# Patient Record
Sex: Male | Born: 1945 | Race: White | Hispanic: No | Marital: Married | Smoking: Former smoker
Health system: Southern US, Community
[De-identification: ages and names within clinical notes are randomized; demographics above are authoritative.]

## PROBLEM LIST (undated history)

## (undated) DIAGNOSIS — K219 Gastro-esophageal reflux disease without esophagitis: Secondary | ICD-10-CM

## (undated) DIAGNOSIS — I10 Essential (primary) hypertension: Secondary | ICD-10-CM

## (undated) DIAGNOSIS — N411 Chronic prostatitis: Secondary | ICD-10-CM

## (undated) DIAGNOSIS — R338 Other retention of urine: Secondary | ICD-10-CM

## (undated) DIAGNOSIS — Z6841 Body Mass Index (BMI) 40.0 and over, adult: Secondary | ICD-10-CM

## (undated) DIAGNOSIS — I872 Venous insufficiency (chronic) (peripheral): Secondary | ICD-10-CM

## (undated) DIAGNOSIS — G4734 Idiopathic sleep related nonobstructive alveolar hypoventilation: Secondary | ICD-10-CM

## (undated) DIAGNOSIS — N401 Enlarged prostate with lower urinary tract symptoms: Secondary | ICD-10-CM

## (undated) DIAGNOSIS — G4733 Obstructive sleep apnea (adult) (pediatric): Secondary | ICD-10-CM

## (undated) DIAGNOSIS — Z8719 Personal history of other diseases of the digestive system: Secondary | ICD-10-CM

## (undated) DIAGNOSIS — L0390 Cellulitis, unspecified: Secondary | ICD-10-CM

## (undated) HISTORY — PX: GASTRIC BYPASS: SHX52

## (undated) HISTORY — DX: Gastro-esophageal reflux disease without esophagitis: K21.9

## (undated) HISTORY — PX: HIP SURGERY: SHX245

## (undated) HISTORY — PX: CHOLECYSTECTOMY: SHX55

## (undated) HISTORY — DX: Essential (primary) hypertension: I10

---

## 2003-01-28 HISTORY — PX: OTHER SURGICAL HISTORY: SHX169

## 2004-01-28 HISTORY — PX: COLONOSCOPY: SHX174

## 2012-06-14 DIAGNOSIS — G4730 Sleep apnea, unspecified: Secondary | ICD-10-CM | POA: Insufficient documentation

## 2012-11-01 ENCOUNTER — Encounter: Payer: Self-pay | Admitting: Surgery

## 2012-11-12 DIAGNOSIS — Z9889 Other specified postprocedural states: Secondary | ICD-10-CM | POA: Insufficient documentation

## 2012-11-12 DIAGNOSIS — E669 Obesity, unspecified: Secondary | ICD-10-CM | POA: Insufficient documentation

## 2012-11-27 ENCOUNTER — Encounter: Payer: Self-pay | Admitting: Surgery

## 2012-11-29 ENCOUNTER — Encounter: Payer: Self-pay | Admitting: Surgery

## 2012-12-14 ENCOUNTER — Ambulatory Visit: Payer: Self-pay | Admitting: Pediatrics

## 2013-01-05 DIAGNOSIS — K219 Gastro-esophageal reflux disease without esophagitis: Secondary | ICD-10-CM | POA: Insufficient documentation

## 2013-01-05 DIAGNOSIS — Z9049 Acquired absence of other specified parts of digestive tract: Secondary | ICD-10-CM | POA: Insufficient documentation

## 2013-01-05 HISTORY — DX: Gastro-esophageal reflux disease without esophagitis: K21.9

## 2013-01-27 HISTORY — PX: PELVIC EXAMINATION UNDER ANESTHESIA CYSTOURETHROSCOPY / SIGMOIDOSCOPY: SUR466

## 2013-02-18 ENCOUNTER — Ambulatory Visit: Payer: Self-pay

## 2013-05-04 ENCOUNTER — Ambulatory Visit: Payer: Self-pay | Admitting: Family Medicine

## 2013-07-24 ENCOUNTER — Ambulatory Visit: Payer: Self-pay

## 2013-07-24 LAB — URINALYSIS, COMPLETE
Bilirubin,UR: NEGATIVE
Glucose,UR: NEGATIVE mg/dL (ref 0–75)
Ketone: NEGATIVE
Nitrite: NEGATIVE
Ph: 6 (ref 4.5–8.0)
Protein: NEGATIVE
Specific Gravity: 1.02 (ref 1.003–1.030)
WBC UR: >30 /HPF (ref 0–5)

## 2013-07-26 LAB — URINE CULTURE

## 2013-09-14 DIAGNOSIS — N390 Urinary tract infection, site not specified: Secondary | ICD-10-CM | POA: Insufficient documentation

## 2013-10-11 DIAGNOSIS — Z6841 Body Mass Index (BMI) 40.0 and over, adult: Secondary | ICD-10-CM | POA: Insufficient documentation

## 2013-10-24 DIAGNOSIS — N138 Other obstructive and reflux uropathy: Secondary | ICD-10-CM | POA: Insufficient documentation

## 2013-10-24 DIAGNOSIS — N401 Enlarged prostate with lower urinary tract symptoms: Secondary | ICD-10-CM

## 2013-12-07 DIAGNOSIS — R339 Retention of urine, unspecified: Secondary | ICD-10-CM | POA: Insufficient documentation

## 2013-12-07 DIAGNOSIS — N4883 Acquired buried penis: Secondary | ICD-10-CM | POA: Insufficient documentation

## 2014-02-21 DIAGNOSIS — I10 Essential (primary) hypertension: Secondary | ICD-10-CM | POA: Insufficient documentation

## 2014-02-21 HISTORY — DX: Essential (primary) hypertension: I10

## 2014-06-30 ENCOUNTER — Encounter: Payer: Self-pay | Admitting: Gynecology

## 2014-06-30 ENCOUNTER — Ambulatory Visit
Admission: EM | Admit: 2014-06-30 | Discharge: 2014-06-30 | Disposition: A | Discharge status: Home / Self Care | Payer: Medicare Other | Attending: Registered Nurse | Admitting: Registered Nurse

## 2014-06-30 DIAGNOSIS — I8312 Varicose veins of left lower extremity with inflammation: Secondary | ICD-10-CM

## 2014-06-30 DIAGNOSIS — Z8719 Personal history of other diseases of the digestive system: Secondary | ICD-10-CM | POA: Insufficient documentation

## 2014-06-30 DIAGNOSIS — L03115 Cellulitis of right lower limb: Secondary | ICD-10-CM

## 2014-06-30 DIAGNOSIS — Z6841 Body Mass Index (BMI) 40.0 and over, adult: Secondary | ICD-10-CM | POA: Diagnosis not present

## 2014-06-30 DIAGNOSIS — N411 Chronic prostatitis: Secondary | ICD-10-CM | POA: Insufficient documentation

## 2014-06-30 DIAGNOSIS — I872 Venous insufficiency (chronic) (peripheral): Secondary | ICD-10-CM

## 2014-06-30 DIAGNOSIS — N401 Enlarged prostate with lower urinary tract symptoms: Secondary | ICD-10-CM | POA: Insufficient documentation

## 2014-06-30 DIAGNOSIS — I8311 Varicose veins of right lower extremity with inflammation: Secondary | ICD-10-CM

## 2014-06-30 DIAGNOSIS — R338 Other retention of urine: Secondary | ICD-10-CM

## 2014-06-30 HISTORY — DX: Benign prostatic hyperplasia with lower urinary tract symptoms: N40.1

## 2014-06-30 HISTORY — DX: Chronic prostatitis: N41.1

## 2014-06-30 HISTORY — DX: Venous insufficiency (chronic) (peripheral): I87.2

## 2014-06-30 HISTORY — DX: Cellulitis, unspecified: L03.90

## 2014-06-30 HISTORY — DX: Gastro-esophageal reflux disease without esophagitis: K21.9

## 2014-06-30 HISTORY — DX: Personal history of other diseases of the digestive system: Z87.19

## 2014-06-30 HISTORY — DX: Morbid (severe) obesity due to excess calories: E66.01

## 2014-06-30 HISTORY — DX: Body Mass Index (BMI) 40.0 and over, adult: Z684

## 2014-06-30 HISTORY — DX: Other retention of urine: R33.8

## 2014-06-30 HISTORY — DX: Essential (primary) hypertension: I10

## 2014-06-30 HISTORY — DX: Obstructive sleep apnea (adult) (pediatric): G47.33

## 2014-06-30 MED ORDER — SULFAMETHOXAZOLE-TRIMETHOPRIM 800-160 MG PO TABS: 1.0000 | ORAL_TABLET | Freq: Two times a day (BID) | ORAL | Status: DC

## 2014-06-30 MED ORDER — MUPIROCIN 2 % EX OINT: TOPICAL_OINTMENT | CUTANEOUS | Status: DC

## 2014-06-30 MED ORDER — LEVOFLOXACIN 500 MG PO TABS: 500.0000 mg | ORAL_TABLET | Freq: Every day | ORAL | Status: AC

## 2014-06-30 NOTE — ED Provider Notes (Signed)
CSN: [id number]     Arrival date & time 06/30/14  1130 History   None    Chief Complaint  Patient presents with  . Recurrent Skin Infections    Cellulitis   (Consider location/radiation/quality/duration/timing/severity/associated sxs/prior Treatment) HPI Comments: [name] with spouse here for evaluation of wound right lateral ankle that has been draining clear fluid and leg hot, painful, red compared to left.  Patient with history of cellulitis requiring wound care 2014 was prescribed bactrim DS 2 tabs po BID subsequently failed outpatient therapy and was given IV antibiotics and [facility] Wound care/hyperbarics referral.  Patient reported he took left over antibiotics from 2014 2 tabs prior to coming to clinic today.  The history is provided by the patient and the spouse.    Past Medical History  Diagnosis Date  . Cellulitis     foot  . GERD (gastroesophageal reflux disease)   . Hypertension   . Obstructive sleep apnea   . BPH (benign prostatic hypertrophy) with urinary retention   . Chronic bacterial prostatitis   . Morbid obesity with BMI of 50.0-59.9, adult   . Stasis dermatitis of both legs   . H/O: upper GI bleed    Past Surgical History  Procedure Laterality Date  . Hip surgery      right  . Gastric bypass    . Cholecystectomy    . Colonoscopy  2006  . Pelvic examination under anesthesia cystourethroscopy / sigmoidoscopy  2015  . Hip replacement Right 2005   Family History  Problem Relation Age of Onset  . Diabetes Mother    History  Substance Use Topics  . Smoking status: Never Smoker   . Smokeless tobacco: Not on file  . Alcohol Use: No    Review of Systems  Constitutional: Negative for fever, chills, diaphoresis, activity change, appetite change and fatigue.  HENT: Negative for facial swelling and nosebleeds.   Eyes: Negative for pain, discharge and itching.  Respiratory: Negative for cough, choking, shortness of breath, wheezing and stridor.     Cardiovascular: Positive for leg swelling. Negative for chest pain and palpitations.  Gastrointestinal: Negative for nausea, vomiting, diarrhea and blood in stool.  Endocrine: Negative for cold intolerance and heat intolerance.  Genitourinary: Negative for difficulty urinating.  Musculoskeletal: Positive for myalgias. Negative for back pain, joint swelling, arthralgias, gait problem, neck pain and neck stiffness.  Skin: Positive for color change, rash and wound. Negative for pallor.  Allergic/Immunologic: Negative for environmental allergies and food allergies.  Neurological: Negative for dizziness, tremors, syncope, facial asymmetry, weakness and headaches.  Hematological: Negative for adenopathy. Does not bruise/bleed easily.  Psychiatric/Behavioral: Negative for behavioral problems, confusion, sleep disturbance and agitation.    Allergies  Aspirin  Home Medications   Prior to Admission medications   Medication Sig Start Date End Date Taking? Authorizing Provider  omeprazole (PRILOSEC) 20 MG capsule Take 20 mg by mouth daily.   Yes Historical Provider, MD  tamsulosin (FLOMAX) 0.4 MG CAPS capsule Take 0.4 mg by mouth.   Yes Historical Provider, MD  triamterene-hydrochlorothiazide (DYAZIDE) 37.5-25 MG per capsule Take 1 capsule by mouth daily.   Yes Historical Provider, MD  levofloxacin (LEVAQUIN) 500 MG tablet Take 1 tablet (500 mg total) by mouth daily. 06/30/14 07/10/14  Barbaraann Barthelina A Gari Trovato, NP  mupirocin ointment (BACTROBAN) 2 % Apply to wound twice a day until healed 06/30/14   Barbaraann Barthelina A Delano Frate, NP  sulfamethoxazole-trimethoprim (BACTRIM DS,SEPTRA DS) 800-160 MG per tablet Take 1 tablet by mouth 2 (two) times  daily. 06/30/14   Barbaraann Barthel, NP   BP 129/54 mmHg  Pulse 60  Temp(Src) 98.2 F (36.8 C) (Oral)  Ht  (1.727 m)  Wt 385 lb (174.635 kg)  BMI 58.55 kg/m2  SpO2 99% Physical Exam  Constitutional: He is oriented to person, place, and time. Vital signs are normal. He  appears well-developed and well-nourished.  HENT:  Head: Normocephalic and atraumatic.  Right Ear: External ear normal.  Left Ear: External ear normal.  Nose: Nose normal.  Mouth/Throat: Oropharynx is clear and moist.  Eyes: Conjunctivae, EOM and lids are normal. Pupils are equal, round, and reactive to light. Right eye exhibits no discharge. Left eye exhibits no discharge. No scleral icterus.  Neck: Trachea normal and normal range of motion. Neck supple. No tracheal deviation present.  Cardiovascular: Normal rate, regular rhythm, normal heart sounds and intact distal pulses.   Pulmonary/Chest: Effort normal and breath sounds normal. No respiratory distress. He has no wheezes. He has no rales. He exhibits no tenderness.  Abdominal: Soft. There is no tenderness.  Musculoskeletal: Normal range of motion. He exhibits edema and tenderness.       Right lower leg: He exhibits tenderness, swelling and laceration. He exhibits no bony tenderness, no edema and no deformity.  Lymphadenopathy:    He has no cervical adenopathy.  Neurological: He is alert and oriented to person, place, and time.  Skin: Skin is warm and dry. Laceration and rash noted. No abrasion, no bruising, no burn, no ecchymosis, no petechiae and no purpura noted. Rash is macular. Rash is not papular, not maculopapular, not nodular, not pustular, not vesicular and not urticarial. There is erythema. No pallor.     Psychiatric: He has a normal mood and affect. His speech is normal and behavior is normal. Judgment and thought content normal. Cognition and memory are normal.  Nursing note and vitals reviewed.   ED Course  Procedures (including critical care time) Labs Review Labs Reviewed - No data to display  Imaging Review No results found.   MDM   1. Cellulitis of right lower extremity   2. Stasis dermatitis of both legs   3. Morbid obesity with BMI of 50.0-59.9, adult    Elevate bilateral lower extremities, contact wound  clinic [facility] Monday to schedule appt as history cellulitis that required wound care referra/hyperbarics/IV antibiotics 2014.  Bilateral stasis dermatitis.  Will cover with levofloxacin  po daily, bactrim DS po BID and bactroban 2% topical BID.  Follow up this weekend for wound re-evaluation if worsening outside lines drawn today.  Exitcare handout on skin infection given to patient.  Go to ER if worsening erythema, pain, purulent discharge, fever as will require IV antibiotics.  Patient and spouse verbalized understanding, agreed with plan of care and had no further questions at this time.      Barbaraann Barthel, NP 06/30/14 1437

## 2014-06-30 NOTE — ED Notes (Signed)
Patient c/o x 4 days ago cut on right lower leg with a weed trimmer at home. Pt. Pt. Recurrent cellulitis the patient. Stated unable to see his primary care doctor and his prior wound center at [facility] need referral before he can be seen.

## 2014-06-30 NOTE — Discharge Instructions (Signed)
Obesity Obesity is defined as having too much total body fat and a body mass index (BMI) of 30 or more. BMI is an estimate of body fat and is calculated from your height and weight. Obesity happens when you consume more calories than you can burn by exercising or performing daily physical tasks. Prolonged obesity can cause major illnesses or emergencies, such as:   Stroke.  Heart disease.  Diabetes.  Cancer.  Arthritis.  High blood pressure (hypertension).  High cholesterol.  Sleep apnea.  Erectile dysfunction.  Infertility problems. CAUSES   Regularly eating unhealthy foods.  Physical inactivity.  Certain disorders, such as an underactive thyroid (hypothyroidism), Cushing's syndrome, and polycystic ovarian syndrome.  Certain medicines, such as steroids, some depression medicines, and antipsychotics.  Genetics.  Lack of sleep. DIAGNOSIS  A health care provider can diagnose obesity after calculating your BMI. Obesity will be diagnosed if your BMI is 30 or higher.  There are other methods of measuring obesity levels. Some other methods include measuring your skinfold thickness, your waist circumference, and comparing your hip circumference to your waist circumference. TREATMENT  A healthy treatment program includes some or all of the following:  Long-term dietary changes.  Exercise and physical activity.  Behavioral and lifestyle changes.  Medicine only under the supervision of your health care provider. Medicines may help, but only if they are used with diet and exercise programs. An unhealthy treatment program includes:  Fasting.  Fad diets.  Supplements and drugs. These choices do not succeed in long-term weight control.  HOME CARE INSTRUCTIONS   Exercise and perform physical activity as directed by your health care provider. To increase physical activity, try the following:  Use stairs instead of elevators.  Park farther away from store  entrances.  Garden, bike, or walk instead of watching television or using the computer.  Eat healthy, low-calorie foods and drinks on a regular basis. Eat more fruits and vegetables. Use low-calorie cookbooks or take healthy cooking classes.  Limit fast food, sweets, and processed snack foods.  Eat smaller portions.  Keep a daily journal of everything you eat. There are many free websites to help you with this. It may be helpful to measure your foods so you can determine if you are eating the correct portion sizes.  Avoid drinking alcohol. Drink more water and drinks without calories.  Take vitamins and supplements only as recommended by your health care provider.  Weight-loss support groups, Government social research officer, counselors, and stress reduction education can also be very helpful. SEEK IMMEDIATE MEDICAL CARE IF:  You have chest pain or tightness.  You have trouble breathing or feel short of breath.  You have weakness or leg numbness.  You feel confused or have trouble talking.  You have sudden changes in your vision. MAKE SURE YOU:  Understand these instructions.  Will watch your condition.  Will get help right away if you are not doing well or get worse. Document Released: 02/21/2004 Document Revised: 05/30/2013 Document Reviewed: 02/19/2011 [facility] Patient Information 2015 [place], [place]. This information is not intended to replace advice given to you by your health care provider. Make sure you discuss any questions you have with your health care provider. Stasis Dermatitis Stasis dermatitis occurs when veins lose the ability to pump blood back to the heart (poor venous circulation). It causes a reddish-purple to brownish scaly, itchy rash on the legs. The rash comes from pooling of blood (stasis). CAUSES  This occurs because the veins do not work very well anymore  or because pressure may be increased in the veins due to other conditions. With blood pooling, the  increased pressure in the tiny blood vessels (capillaries) causes fluid to leak out of the capillaries into the tissue. The extra fluid makes it harder for the blood to feed the cells and get rid of waste products. SYMPTOMS  Stasis dermatitis appears as red, scaly, itchy patches on the legs. A yellowish or light brown discoloration is also present. Due to scratching or other injury, these patches can become an ulcer. This ulcer may remain for long periods of time. The ulcer can also become infected. Swelling of the legs is often present with stasis dermatitis. If the leg is swollen, this increases the risk of infection and further damage to the skin. Sometimes, intense itching, tingling, and burning occurs before signs of stasis dermatitis appear. You may find yourself scratching the insides of your ankles or rubbing your ankles together before the rash appears. After healing, there are often brown spots on the affected skin. DIAGNOSIS  Your caregiver makes this diagnosis based on an exam. Other tests may be done to better understand the cause. TREATMENT  If underlying conditions are present, they must be treated. Some of these conditions are heart failure, thyroid problems, poor nutrition, and varicose veins.  Cortisone creams and ointments applied to the skin (topically) may be needed, as well as medicine to reduce swelling in the legs (diuretics).  Compression stockings or an elastic wrap may also be needed to reduce swelling.  If there is an infection, antibiotic medicines may also be used. HOME CARE INSTRUCTIONS   Try to rest and raise (elevate) the affected leg above the level of the heart, if possible.  Burow's solution wet packs applied for 30 minutes, 3 times daily, will help the weepy rash. Stop using the packs before your skin gets too dry. You can also use a mixture of 3 parts white vinegar to 1 quart water.  Grease your legs daily with ointments, such as petroleum jelly, to fight  dryness.  Avoid scratching or injuring the area. SEEK IMMEDIATE MEDICAL CARE IF:   Your rash gets worse.  An ulcer forms.  You have an oral temperature above 102 F (38.9 C), not controlled by medicine.  You have any other severe symptoms. Document Released: 04/24/2005 Document Revised: 04/07/2011 Document Reviewed: 06/04/2009 [facility] Patient Information 2015 [place]. This information is not intended to replace advice given to you by your health care provider. Make sure you discuss any questions you have with your health care provider. Cellulitis Cellulitis is an infection of the skin and the tissue beneath it. The infected area is usually red and tender. Cellulitis occurs most often in the arms and lower legs.  CAUSES  Cellulitis is caused by bacteria that enter the skin through cracks or cuts in the skin. The most common types of bacteria that cause cellulitis are staphylococci and streptococci. SIGNS AND SYMPTOMS   Redness and warmth.  Swelling.  Tenderness or pain.  Fever. DIAGNOSIS  Your health care provider can usually determine what is wrong based on a physical exam. Blood tests may also be done. TREATMENT  Treatment usually involves taking an antibiotic medicine. HOME CARE INSTRUCTIONS   Take your antibiotic medicine as directed by your health care provider. Finish the antibiotic even if you start to feel better.  Keep the infected arm or leg elevated to reduce swelling.  Apply a warm cloth to the affected area up to 4 times per  day to relieve pain.  Take medicines only as directed by your health care provider.  Keep all follow-up visits as directed by your health care provider. SEEK MEDICAL CARE IF:   You notice red streaks coming from the infected area.  Your red area gets larger or turns dark in color.  Your bone or joint underneath the infected area becomes painful after the skin has healed.  Your infection returns in the same area or another  area.  You notice a swollen bump in the infected area.  You develop new symptoms.  You have a fever. SEEK IMMEDIATE MEDICAL CARE IF:   You feel very sleepy.  You develop vomiting or diarrhea.  You have a general ill feeling (malaise) with muscle aches and pains. MAKE SURE YOU:   Understand these instructions.  Will watch your condition.  Will get help right away if you are not doing well or get worse. Document Released: 10/23/2004 Document Revised: 05/30/2013 Document Reviewed: 03/31/2011 [facility] Patient Information 2015 [place], [place]. This information is not intended to replace advice given to you by your health care provider. Make sure you discuss any questions you have with your health care provider.

## 2014-07-04 ENCOUNTER — Encounter: Payer: Medicare Other | Attending: Surgery | Admitting: Surgery

## 2014-07-04 DIAGNOSIS — I10 Essential (primary) hypertension: Secondary | ICD-10-CM | POA: Insufficient documentation

## 2014-07-04 DIAGNOSIS — R600 Localized edema: Secondary | ICD-10-CM | POA: Diagnosis not present

## 2014-07-04 DIAGNOSIS — Z87891 Personal history of nicotine dependence: Secondary | ICD-10-CM | POA: Insufficient documentation

## 2014-07-04 DIAGNOSIS — X58XXXA Exposure to other specified factors, initial encounter: Secondary | ICD-10-CM | POA: Diagnosis not present

## 2014-07-04 DIAGNOSIS — S81811A Laceration without foreign body, right lower leg, initial encounter: Secondary | ICD-10-CM | POA: Insufficient documentation

## 2014-07-04 DIAGNOSIS — G4733 Obstructive sleep apnea (adult) (pediatric): Secondary | ICD-10-CM | POA: Diagnosis not present

## 2014-07-04 DIAGNOSIS — I87333 Chronic venous hypertension (idiopathic) with ulcer and inflammation of bilateral lower extremity: Secondary | ICD-10-CM | POA: Insufficient documentation

## 2014-07-04 DIAGNOSIS — L97312 Non-pressure chronic ulcer of right ankle with fat layer exposed: Secondary | ICD-10-CM | POA: Insufficient documentation

## 2014-07-04 NOTE — Progress Notes (Addendum)
INDIO, SANTILLI ([id number]) Visit Report for 07/04/2014 Chief Complaint Document Details Patient Name: [name]. Date of Service: 07/04/2014 11:15 AM Medical Record Number: [id number] Patient Account Number: [id number] Date of Birth/Sex: 1945/04/21 (69 y.o. Male) Treating RN: Primary Care Physician: HEARDChrissie Noa Other Clinician: Referring Physician: HEARD, Chrissie Noa Treating Physician/Extender: Rudene Re in Treatment: 0 Information Obtained from: Patient Chief Complaint Patient presents to the wound care center for a consult due non healing wound. He recently had a injury to his right lower extremity on the lateral part while working in the yard about 2 weeks ago. This progressed to an ulceration and he went to the ED about 4 days ago. Electronic Signature(s) Signed: 07/04/2014 12:36:02 PM By: Evlyn Kanner MD, FACS Entered By: Evlyn Kanner on 07/04/2014 12:26:34 [name] ([id number]) -------------------------------------------------------------------------------- Debridement Details Patient Name: [name]. Date of Service: 07/04/2014 11:15 AM Medical Record Number: [id number] Patient Account Number: [id number] Date of Birth/Sex: 02/25/1945 (69 y.o. Male) Treating RN: Primary Care Physician: HEARDChrissie Noa Other Clinician: Referring Physician: HEARD, Chrissie Noa Treating Physician/Extender: Rudene Re in Treatment: 0 Debridement Performed for Wound #4 Right,Lateral Malleolus Assessment: Performed By: Physician Tristan Schroeder., MD Debridement: Debridement Pre-procedure Yes Verification/Time Out Taken: Start Time: 09:15 Pain Control: Lidocaine 4% Topical Solution Level: Skin/Subcutaneous Tissue Total Area Debrided (L x 5.1 (cm) x 3.2 (cm) = 16.32 (cm) W): Tissue and other Viable, Non-Viable, Eschar, Exudate, Fibrin/Slough, Skin, Subcutaneous material debrided: Instrument: Curette Bleeding: Minimum Hemostasis Achieved: Pressure End Time:  09:18 Procedural Pain: 2 Post Procedural Pain: 0 Response to Treatment: Procedure was tolerated well Post Debridement Measurements of Total Wound Length: (cm) 5.1 Width: (cm) 3.2 Depth: (cm) 0.2 Volume: (cm) 2.564 Electronic Signature(s) Signed: 07/04/2014 12:36:02 PM By: Evlyn Kanner MD, FACS Entered By: Evlyn Kanner on 07/04/2014 12:25:42 [name] ([id number]) -------------------------------------------------------------------------------- HPI Details Patient Name: [name]. Date of Service: 07/04/2014 11:15 AM Medical Record Number: [id number] Patient Account Number: [id number] Date of Birth/Sex: Apr 14, 1945 (69 y.o. Male) Treating RN: Primary Care Physician: Tawni Levy Other Clinician: Referring Physician: HEARD, Chrissie Noa Treating Physician/Extender: Rudene Re in Treatment: 0 History of Present Illness Location: right lower extremity Quality: Patient reports experiencing a dull pain to affected area(s). Severity: Patient states wound are getting worse. Duration: Patient has had the wound for < 2 weeks prior to presenting for treatment Timing: injury occurred while he was working in the yard where he had a laceration on the right lateral aspect of his ankle. Context: The wound occurred when the patient was using a weed Johnell Comings. Modifying Factors: Consults to this date include:going to the ED where he was put on Levaquin and Bactrim and Bactroban. Associated Signs and Symptoms: Patient reports having difficulty standing for long periods. HPI Description: As noted above the patient had a injury due to working in the yard and this rapidly progressed to a ulcerated area with an infection. In October 2014 he did have some vascular studies done but never saw a Careers adviser and never had any surgery for varicose veins. His past medical history is significant for hypertension, obstructive sleep apnea, morbid obesity, stasis dermatitis of both lower extremities and  his past surgical history is suggestive of hip surgery on the right, gastric bypass surgery, cholecystectomy, hip replacement. He has recently been put on Levaquin 500 milligrams daily, Bactrim DS 1 twice a day and Bactroban 2% topical ointment locally. No recent labs or vascular or imaging studies done. Electronic Signature(s) Signed: 07/04/2014 12:36:02 PM By: Evlyn Kanner  MD, FACS Entered By: Evlyn Kanner on 07/04/2014 12:28:20 [name] ([id number]) -------------------------------------------------------------------------------- Physical Exam Details Patient Name: [name], [name] Date of Service: 07/04/2014 11:15 AM Medical Record Number: [id number] Patient Account Number: [id number] Date of Birth/Sex: 07-13-1945 (69 y.o. Male) Treating RN: Primary Care Physician: Tawni Levy Other Clinician: Referring Physician: HEARD, Chrissie Noa Treating Physician/Extender: Rudene Re in Treatment: 0 Constitutional . Pulse regular. Respirations normal and unlabored. Afebrile. . Eyes Nonicteric. Reactive to light. Ears, Nose, Mouth, and Throat Lips, teeth, and gums WNL.Marland [name] Moist mucosa without lesions . Neck supple and nontender. No palpable supraclavicular or cervical adenopathy. Normal sized without goiter. Respiratory WNL. No retractions.. Cardiovascular Pedal Pulses WNL. ABI on the right is 1.27 and on the left is 1.21.. he has typical stasis dermatitis both lower extremities.. Gastrointestinal (GI) large obese abdomen. liver and spleen not palpable. Musculoskeletal Adexa without tenderness or enlargement.. Digits and nails w/o clubbing, cyanosis, infection, petechiae, ischemia, or inflammatory conditions.. Integumentary (Hair, Skin) he has a lacerated wound on the right lower extremity on the lateral aspect just above his lateral malleolus and this is rather diffuse and covered with slough.. No crepitus or fluctuance. No peri-wound warmth or erythema. No  masses.Marland [name] Psychiatric Judgement and insight Intact.. No evidence of depression, anxiety, or agitation.. Electronic Signature(s) Signed: 07/04/2014 12:36:02 PM By: Evlyn Kanner MD, FACS Entered By: Evlyn Kanner on 07/04/2014 12:29:46 [name] ([id number]) -------------------------------------------------------------------------------- Physician Orders Details Patient Name: [name]. Date of Service: 07/04/2014 11:15 AM Medical Record Number: [id number] Patient Account Number: [id number] Date of Birth/Sex: Jul 04, 1945 (69 y.o. Male) Treating RN: Renee Harder Primary Care Physician: HEARD, Chrissie Noa Other Clinician: Referring Physician: HEARD, Chrissie Noa Treating Physician/Extender: Rudene Re in Treatment: 0 Verbal / Phone Orders: Yes Clinician: Renee Harder Read Back and Verified: Yes Diagnosis Coding Wound Cleansing Wound #4 Right,Lateral Malleolus o Clean wound with Normal Saline. o May Shower, gently pat wound dry prior to applying new dressing. Anesthetic Wound #4 Right,Lateral Malleolus o Topical Lidocaine 4% cream applied to wound bed prior to debridement Primary Wound Dressing Wound #4 Right,Lateral Malleolus o Aquacel Ag Secondary Dressing Wound #4 Right,Lateral Malleolus o Boardered Foam Dressing Dressing Change Frequency Wound #4 Right,Lateral Malleolus o Change dressing every other day. Follow-up Appointments Wound #4 Right,Lateral Malleolus o Return Appointment in 1 week. Edema Control Wound #4 Right,Lateral Malleolus o Patient to wear own compression stockings o Elevate legs to the level of the heart and pump ankles as often as possible Medications-please add to medication list. Wound #4 Right,Lateral Malleolus o P.O. Antibiotics - Continue PO antibiotics prescribed by [facility] ED Malina, [name] ([id number]) Services and Therapies o Venous Studies -Bilateral - AVandV oooo Electronic Signature(s) Signed: 07/04/2014  12:36:02 PM By: Evlyn Kanner MD, FACS Signed: 07/04/2014 4:43:53 PM By: Renee Harder RN Entered By: Renee Harder on 07/04/2014 12:22:20 Markuson, [name] ([id number]) -------------------------------------------------------------------------------- Problem List Details Patient Name: ZAKAI, GONYEA R. Date of Service: 07/04/2014 11:15 AM Medical Record Number: [id number] Patient Account Number: [id number] Date of Birth/Sex: 07/29/1945 (69 y.o. 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 Date of Service: 07/04/2014 11:15 AM Medical Record Number: [id number] Patient Account Number: [id number] Date of Birth/Sex: 08-Sep-1945 (69 y.o. Male) Treating RN: Primary Care Physician: HEARDChrissie Noa Other Clinician: Referring Physician: HEARD, Chrissie Noa Treating Physician/Extender: Rudene Re in Treatment: 0 Subjective Chief Complaint Information obtained from Patient Patient presents to the wound care center for a consult due non healing wound. He recently had a injury to his right lower extremity on the  lateral part while working in the yard about 2 weeks ago. This progressed to an ulceration and he went to the ED about 4 days ago. History of Present Illness (HPI) The following HPI elements were documented for the patient's wound: Location: right lower extremity Quality: Patient reports experiencing a dull pain to affected area(s). Severity: Patient states wound are getting worse. Duration: Patient has had the wound for < 2 weeks prior to presenting for treatment Timing: injury occurred while he was working in the yard where he had a laceration on the right lateral aspect of his ankle. Context: The wound occurred when the patient was using a weed Johnell Comings. Modifying Factors: Consults to this date include:going to the ED where he was put on Levaquin and Bactrim and Bactroban. Associated Signs and Symptoms: Patient reports having difficulty standing for long periods. As noted above the patient had a injury due to working in the yard and this rapidly progressed to a ulcerated area with an infection. In October 2014 he did have some vascular studies done but never saw a Careers adviser and never had any surgery for varicose veins. His past medical history is significant for hypertension, obstructive sleep apnea, morbid obesity, stasis dermatitis of both lower extremities and his past surgical history is suggestive of hip surgery on the right, gastric bypass surgery, cholecystectomy, hip replacement. He has recently been put on Levaquin 500 milligrams daily, Bactrim DS 1 twice a day and Bactroban 2% topical ointment locally. No recent labs or vascular or imaging studies done. Wound History Patient presents with 1 open wound that has been present for approximately 10 days. Patient has been treating wound in the following manner: bactroban. Laboratory tests have not been performed in the last month. Patient reportedly has not tested positive for an antibiotic resistant organism. Patient reportedly has not  tested positive for osteomyelitis. Patient reportedly has had testing performed to evaluate circulation in the legs. Patient experiences the following problems associated with their wounds: infection. IKER, NUTTALL ([id number]) Patient History Information obtained from Patient. Allergies aspirin Family History Cancer - Mother, Diabetes - Mother, Heart Disease - Maternal Grandparents, No family history of Hereditary Spherocytosis, Hypertension, Kidney Disease, Lung Disease, Seizures, Stroke, Thyroid Problems, Tuberculosis. Social History Former smoker, Marital Status - Married, Alcohol Use - Rarely, Drug Use - No History, Caffeine Use - Moderate. Medical History Hospitalization/Surgery History - 06/28/2003, [place], cellulitis. - 07/27/2005, [facility] [place], cellulitis. - 07/28/2003, [facility], hip replacement. - 06/30/2014, [facility] ED, cellulitis. Review of Systems (ROS) Eyes The patient has no complaints or symptoms. Ear/Nose/Mouth/Throat The patient has no complaints or symptoms. Hematologic/Lymphatic The patient has no complaints or symptoms. Respiratory The patient has no complaints or symptoms. Cardiovascular The patient has no complaints or symptoms. Gastrointestinal The patient has no complaints or symptoms, GERD Endocrine The patient has no complaints or symptoms. Genitourinary The patient  has no complaints or symptoms, BPH with urinary retention Immunological The patient has no complaints or symptoms. Integumentary (Skin) Complains or has symptoms of Wounds, Swelling. Denies complaints or symptoms of Bleeding or bruising tendency, Breakdown, stasis dermatitis Musculoskeletal The patient has no complaints or symptoms. Neurologic The patient has no complaints or symptoms. Oncologic The patient has no complaints or symptoms. Psychiatric The patient has no complaints or symptoms. [name] AlmaSHAFAR, [name] ([id number]) Medications: I have reviewed a list of his medications which include  Prilosec, Flomax, Dyazide, Levaquin, Bactroban ointment, Bactrim DS Objective Constitutional Pulse regular. Respirations normal and unlabored. Afebrile. Vitals Time Taken: 11:22 AM, Height: 68 in, Source: Stated, Weight: 385 lbs, Source: Stated, BMI: 58.5, Temperature: 98.1 F, Pulse: 63 bpm, Respiratory Rate: 20 breaths/min, Blood Pressure: 130/44 mmHg. Eyes Nonicteric. Reactive to light. Ears, Nose, Mouth, and Throat Lips, teeth, and gums WNL.Marland [name]. Moist mucosa without lesions . Neck supple and nontender. No palpable supraclavicular or cervical adenopathy. Normal sized without goiter. Respiratory WNL. No retractions.. Cardiovascular Pedal Pulses WNL. ABI on the right is 1.27 and on the left is 1.21.. he has typical stasis dermatitis both lower extremities.. Gastrointestinal (GI) large obese abdomen. liver and spleen not palpable. Musculoskeletal Adexa without tenderness or enlargement.. Digits and nails w/o clubbing, cyanosis, infection, petechiae, ischemia, or inflammatory conditions.Marland [name]. Psychiatric Judgement and insight Intact.. No evidence of depression, anxiety, or agitation.[name], [name] ([id number]) Integumentary (Hair, Skin) he has a lacerated wound on the right lower extremity on the lateral aspect just above his lateral malleolus and this is rather diffuse and covered with slough.. No crepitus or fluctuance. No peri-wound warmth or erythema. No masses.. Wound #4 status is Open. Original cause of wound was Trauma. The wound is located on the Right,Lateral Malleolus. The wound measures 5.1cm length x 3.2cm width x 0.2cm depth; 12.818cm^2 area and 2.564cm^3 volume. The wound is limited to skin breakdown. There is no tunneling or undermining noted. There is a medium amount of serous drainage noted. The wound margin is flat and intact. There is small (1-33%) pink granulation within the wound bed. There is a large (67-100%) amount of necrotic tissue within the wound bed  including Adherent Slough. The periwound skin appearance did not exhibit: Callus, Crepitus, Excoriation, Fluctuance, Friable, Induration, Localized Edema, Rash, Scarring, Dry/Scaly, Maceration, Moist, Atrophie Blanche, Cyanosis, Ecchymosis, Hemosiderin Staining, Mottled, Pallor, Rubor, Erythema. Periwound temperature was noted as No Abnormality. 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 What was possibly a minor laceration has now grown up into a full fledged ulceration on the lateral aspect of his right lower extremity and ankle and this is possibly due to his venous hypertension. Will begin by application of silver alginate to this on alternate days and to start using his compression stockings as before. We will also get him to get a venous ultrasound of his lower extremities and vascular opinion from the [facility] at [facility]. ([id number]) he  understands the need for elevation of his limbs whenever possible and sleeping with his legs on pillows to elevate them above the level of his heart. He will also need compression stockings on a regular basis and he understands the importance of this. Complete his course of antibiotics and see me next week. Procedures Wound #4 Wound #4 is a Trauma, Other located on the Right,Lateral  Malleolus . There was a Skin/Subcutaneous Tissue Debridement ([id number]) debridement with total area of 16.32 sq cm performed by Tristan Schroeder., MD. with the following instrument(s): Curette to remove Viable and Non-Viable tissue/material including Exudate, Fibrin/Slough, Eschar, Skin, and Subcutaneous after achieving pain control using Lidocaine 4% Topical Solution. A time out was conducted prior to the start of the procedure. A Minimum amount of bleeding was controlled with Pressure. The procedure was tolerated well with a pain level of 2 throughout and a pain level of 0 following the procedure. Post Debridement Measurements: 5.1cm length x 3.2cm width x 0.2cm depth; 2.564cm^3 volume. Plan Wound Cleansing: Wound #4 Right,Lateral Malleolus: Clean wound with Normal Saline. May Shower, gently pat wound dry prior to applying new dressing. Anesthetic: Wound #4 Right,Lateral Malleolus: Topical Lidocaine 4% cream applied to wound bed prior to debridement Primary Wound Dressing: Wound #4 Right,Lateral Malleolus: Aquacel Ag Secondary Dressing: Wound #4 Right,Lateral Malleolus: Boardered Foam Dressing Dressing Change Frequency: Wound #4 Right,Lateral Malleolus: Change dressing every other day. Follow-up Appointments: Wound #4 Right,Lateral Malleolus: Return Appointment in 1 week. Edema Control: Wound #4 Right,Lateral Malleolus: Patient to wear own compression stockings Elevate legs to the level of the heart and pump ankles as often as possible Medications-please add to medication list.: Wound #4 Right,Lateral Malleolus: P.O. Antibiotics - Continue PO antibiotics prescribed by [facility] ED Services and Therapies ordered were: [name], [name] ([id number]) Venous Studies -Bilateral - AVandV This pleasant 69 year old gentleman who has morbid obesity and possibly chronic venous hypertension for several years has not been using any elastic compression stockings for a long while. What was  possibly a minor laceration has now grown up into a full fledged ulceration on the lateral aspect of his right lower extremity and ankle and this is possibly due to his venous hypertension. Will begin by application of silver alginate to this on alternate days and to start using his compression stockings as before. We will also get him to get a venous ultrasound of his lower extremities and vascular opinion from the surgeons at [facility]. he understands the need for elevation of his limbs whenever possible and sleeping with his legs on pillows to elevate them above the level of his heart. He will also need compression stockings on a regular basis and he understands the importance of this. Complete his course of antibiotics and see me next week. Electronic Signature(s) Signed: 07/11/2014 11:20:23 AM By: Renee Harder RN Signed: 07/12/2014 4:45:54 PM By: Evlyn Kanner MD, FACS Previous Signature: 07/04/2014 3:34:49 PM Version By: Evlyn Kanner MD, FACS Previous Signature: 07/04/2014 12:36:02 PM Version By: Evlyn Kanner MD, FACS Entered By: Renee Harder on 07/11/2014 11:20:23 Brix, [name] ([id number]) -------------------------------------------------------------------------------- ROS/PFSH Details Patient Name: RAMZI, BRATHWAITE R. Date of Service: 07/04/2014 11:15 AM Medical Record Number: [id number] Patient Account Number: [id number] Date of Birth/Sex: 23-Jun-1945 (69 y.o. Male) Treating RN: Curtis Sites Primary Care Physician: HEARD, Chrissie Noa Other Clinician: Referring Physician: HEARD, Chrissie Noa Treating Physician/Extender: Rudene Re in Treatment: 0 Information Obtained From Patient Wound History Do you currently have one or more open woundso Yes How many open wounds do you currently  haveo 1 Approximately how long have you had your woundso 10 days How have you been treating your wound(s) until nowo bactroban Has your wound(s) ever healed and then re-openedo No Have you had any lab work  done in the past montho No Have you tested positive for an antibiotic resistant organism (MRSA, VRE)o No Have you tested positive for osteomyelitis (bone infection)o No Have you had any tests for circulation on your legso Yes Who ordered the testo [facility] wound center Where was the test doneo [facility] 2014 Have you had other problems associated with your woundso Infection Integumentary (Skin) Complaints and Symptoms: Positive for: Wounds; Swelling Negative for: Bleeding or bruising tendency; Breakdown Review of System Notes: stasis dermatitis Medical History: Negative for: History of Burn; History of pressure wounds Eyes Complaints and Symptoms: No Complaints or Symptoms Medical History: Negative for: Cataracts; Glaucoma; Optic Neuritis Ear/Nose/Mouth/Throat Complaints and Symptoms: No Complaints or Symptoms Medical History: [name], [name] ([id number]) Negative for: Chronic sinus problems/congestion; Middle ear problems Hematologic/Lymphatic Complaints and Symptoms: No Complaints or Symptoms Medical History: Positive for: Anemia - iron pills Negative for: Hemophilia; Human Immunodeficiency Virus; Lymphedema; Sickle Cell Disease Respiratory Complaints and Symptoms: No Complaints or Symptoms Medical History: Positive for: Sleep Apnea - C-pap and oxygen 1.5L at night Negative for: Aspiration; Asthma; Chronic Obstructive Pulmonary Disease (COPD); Pneumothorax; Tuberculosis Cardiovascular Complaints and Symptoms: No Complaints or Symptoms Medical History: Positive for: Hypertension Negative for: Angina; Arrhythmia; Congestive Heart Failure; Coronary Artery Disease; Deep Vein Thrombosis; Hypotension; Myocardial Infarction; Peripheral Arterial Disease; Peripheral Venous Disease; Phlebitis; Vasculitis Gastrointestinal Complaints and Symptoms: No Complaints or Symptoms Complaints and Symptoms: Review of System Notes: GERD Medical History: Negative for: Cirrhosis ; Colitis;  Crohnos; Hepatitis A; Hepatitis B; Hepatitis C Endocrine Complaints and Symptoms: No Complaints or Symptoms Medical History: Negative for: Type I Diabetes; Type II Diabetes [name], [name] ([id number]) Genitourinary Complaints and Symptoms: No Complaints or Symptoms Complaints and Symptoms: Review of System Notes: BPH with urinary retention Medical History: Negative for: End Stage Renal Disease Immunological Complaints and Symptoms: No Complaints or Symptoms Medical History: Negative for: Lupus Erythematosus; Raynaudos; Scleroderma Musculoskeletal Complaints and Symptoms: No Complaints or Symptoms Medical History: Negative for: Gout; Rheumatoid Arthritis; Osteoarthritis; Osteomyelitis Neurologic Complaints and Symptoms: No Complaints or Symptoms Medical History: Negative for: Dementia; Neuropathy; Quadriplegia; Paraplegia; Seizure Disorder Oncologic Complaints and Symptoms: No Complaints or Symptoms Medical History: Negative for: Received Chemotherapy; Received Radiation Psychiatric Complaints and Symptoms: No Complaints or Symptoms Medical History: Negative for: Anorexia/bulimia; Confinement Anxiety [name], [name] ([id number]) Immunizations Tetanus Vaccine: Last tetanus shot: 06/28/2011 Hospitalization / Surgery History Name of Hospital Purpose of Hospitalization/Surgery Date [place] cellulitis 06/28/2003 [facility] [place] cellulitis 07/27/2005 [facility] hip replacement 07/28/2003 [facility] ED cellulitis 06/30/2014 Family and Social History Cancer: Yes - Mother; Diabetes: Yes - Mother; Heart Disease: Yes - Maternal Grandparents; Hereditary Spherocytosis: No; Hypertension: No; Kidney Disease: No; Lung Disease: No; Seizures: No; Stroke: No; Thyroid Problems: No; Tuberculosis: No; Former smoker; Marital Status - Married; Alcohol Use: Rarely; Drug Use: No History; Caffeine Use: Moderate; Financial Concerns: No; Food, Clothing or Shelter Needs: No; Support System Lacking: No;  Transportation Concerns: No; Advanced Directives: Yes (Not Provided); Patient does not want information on Advanced Directives; Living Will: Yes (Not Provided); Medical Power of Attorney: Yes (Not Provided) Physician Affirmation I have reviewed and agree with the above information. Electronic Signature(s) Signed: 07/04/2014 12:36:02 PM By: Evlyn Kanner MD, FACS Signed: 07/04/2014 2:45:57 PM By: Curtis Sites Entered By: Evlyn Kanner on 07/04/2014 11:55:08 [name],  [name] RMarland [name] ([id number]) -------------------------------------------------------------------------------- SuperBill Details Patient Name: [name], [name]. Date of Service: 07/04/2014 Medical Record Number: [id number] Patient Account Number: [id number] Date of Birth/Sex: 06/16/1945 (69 y.o. 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 Morbid (severe) obesity due to excess calories Facility Procedures CPT4: Description Modifier Quantity Code [id number] 99213 - WOUND CARE VISIT-LEV 3 EST PT 1 CPT4: 46962952 11042 - DEB SUBQ TISSUE 20 SQ CM/< 1 ICD-10 Description Diagnosis S81.811A Laceration without foreign body, right lower leg, initial encounter I87.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 Chronic venous hypertension (idiopathic) with ulcer and inflammation of bilateral lower  extremity 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 ([id number]) Electronic Signature(s) Signed: 07/04/2014 12:36:02 PM By: Evlyn Kanner MD, FACS Entered By: Evlyn Kanner on 07/04/2014 12:33:57

## 2014-07-04 NOTE — Progress Notes (Signed)
[name], [name] ([id number]) Visit Report for 07/04/2014 Abuse/Suicide Risk Screen Details Patient Name: [name], [name]. Date of Service: 07/04/2014 11:15 AM Medical Record Number: [id number] Patient Account Number: [id number] Date of Birth/Sex: 11-01-1945 (69 y.o. Male) Treating RN: Curtis Sites Primary Care Physician: HEARD, Chrissie Noa Other Clinician: Referring Physician: HEARD, Chrissie Noa Treating Physician/Extender: Rudene Re in Treatment: 0 Abuse/Suicide Risk Screen Items Answer ABUSE/SUICIDE RISK SCREEN: Has anyone close to you tried to hurt or harm you recentlyo No Do you feel uncomfortable with anyone in your familyo No Has anyone forced you do things that you didnot want to doo No Do you have any thoughts of harming yourselfo No Patient displays signs or symptoms of abuse and/or neglect. No Electronic Signature(s) Signed: 07/04/2014 2:45:57 PM By: Curtis Sites Entered By: Curtis Sites on 07/04/2014 11:32:54 Kozlowski, Jorje Guild ([id number]) -------------------------------------------------------------------------------- Activities of Daily Living Details Patient Name: [name], [name] Date of Service: 07/04/2014 11:15 AM Medical Record Number: [id number] Patient Account Number: [id number] Date of Birth/Sex: 1945-06-22 (69 y.o. Male) Treating RN: Curtis Sites Primary Care Physician: HEARD, Chrissie Noa Other Clinician: Referring Physician: HEARD, Chrissie Noa Treating Physician/Extender: Rudene Re in Treatment: 0 Activities of Daily Living Items Answer Activities of Daily Living (Please select one for each item) Drive Automobile Completely Able Take Medications Completely Able Use Telephone Completely Able Care for Appearance Completely Able Use Toilet Completely Able Bath / Shower Completely Able Dress Self Completely Able Feed Self Completely Able Walk Completely Able Get In / Out Bed Completely Able Housework Completely Able Prepare Meals Completely  Able Handle Money Completely Able Shop for Self Completely Able Electronic Signature(s) Signed: 07/04/2014 2:45:57 PM By: Curtis Sites Entered By: Curtis Sites on 07/04/2014 11:33:25 Castrillo, Jorje Guild ([id number]) -------------------------------------------------------------------------------- Education Assessment Details Patient Name: [name] Alma. Date of Service: 07/04/2014 11:15 AM Medical Record Number: [id number] Patient Account Number: [id number] Date of Birth/Sex: September 24, 1945 (69 y.o. Male) Treating RN: Curtis Sites Primary Care Physician: HEARD, Chrissie Noa Other Clinician: Referring Physician: HEARD, Chrissie Noa Treating Physician/Extender: Rudene Re in Treatment: 0 Primary Learner Assessed: Patient Learning Preferences/Education Level/Primary Language Learning Preference: Explanation, Demonstration Highest Education Level: College or Above Preferred Language: English Cognitive Barrier Assessment/Beliefs Language Barrier: No Translator Needed: No Memory Deficit: No Emotional Barrier: No Cultural/Religious Beliefs Affecting Medical No Care: Physical Barrier Assessment Impaired Vision: No Impaired Hearing: No Decreased Hand dexterity: No Knowledge/Comprehension Assessment Knowledge Level: High Comprehension Level: High Ability to understand written Medium instructions: Ability to understand verbal Medium instructions: Motivation Assessment Anxiety Level: Calm Cooperation: Cooperative Education Importance: Acknowledges Need Interest in Health Problems: Asks Questions Perception: Coherent Willingness to Engage in Self- Medium Management Activities: Readiness to Engage in Self- Medium Management Activities: Electronic Signature(s) ADI, DORO ([id number]) Signed: 07/04/2014 2:45:57 PM By: Curtis Sites Entered By: Curtis Sites on 07/04/2014 11:33:53 Cassis, Jorje Guild  ([id number]) -------------------------------------------------------------------------------- Fall Risk Assessment Details Patient Name: [name] Alma. Date of Service: 07/04/2014 11:15 AM Medical Record Number: [id number] Patient Account Number: [id number] Date of Birth/Sex: 1945-01-31 (69 y.o. Male) Treating RN: Curtis Sites Primary Care Physician: HEARD, Chrissie Noa Other Clinician: Referring Physician: HEARD, Chrissie Noa Treating Physician/Extender: Rudene Re in Treatment: 0 Fall Risk Assessment Items FALL RISK ASSESSMENT: History of falling - immediate or within 3 months 25 Yes Secondary diagnosis 0 No Ambulatory aid None/bed rest/wheelchair/nurse 0 Yes Crutches/cane/walker 0 No Furniture 0 No IV Access/Saline Lock 0 No Gait/Training Normal/bed rest/immobile 0 Yes Weak 0 No Impaired 0 No Mental Status Oriented to own ability 0 Yes Electronic Signature(s) Signed: 07/04/2014  2:45:57 PM By: Curtis Sitesorthy, Joanna Entered By: Curtis Sitesorthy, Joanna on 07/04/2014 11:34:16 Delval, [name] -------------------------------------------------------------------------------- Foot Assessment Details Patient Name: [name] AlmaSHAFAR, [name] Date of Service: 07/04/2014 11:15 AM Medical Record Number: [id number] Patient Account Number: [id number] Date of Birth/Sex: 08/08/1945 [age] y.o. Male) Treating RN: Curtis Sitesorthy, Joanna Primary Care Physician: HEARD, Chrissie NoaWILLIAM Other Clinician: Referring Physician: HEARD, Chrissie NoaWILLIAM Treating Physician/Extender: Rudene ReBritto, Errol Weeks in Treatment: 0 Foot Assessment Items Site Locations + = Sensation present, - = Sensation absent, C = Callus, U = Ulcer R = Redness, W = Warmth, M = Maceration, PU = Pre-ulcerative lesion F = Fissure, S = Swelling, D = Dryness Assessment Right: Left: Other Deformity: No No Prior Foot Ulcer: No No Prior Amputation: No No Charcot Joint: No No Ambulatory Status: Ambulatory With Help Assistance Device: Cane Gait: Steady Electronic  Signature(s) Signed: 07/04/2014 2:45:57 PM By: Curtis Sitesorthy, Joanna Entered By: Curtis Sitesorthy, Joanna on 07/04/2014 11:39:50 Edgar, [name] ([id number]) -------------------------------------------------------------------------------- Nutrition Risk Assessment Details Patient Name: [name] AlmaSHAFAR, [name] Date of Service: 07/04/2014 11:15 AM Medical Record Number: [id number] Patient Account Number: [id number] Date of Birth/Sex: 07/02/1945 [age] y.o. Male) Treating RN: Curtis Sitesorthy, Joanna Primary Care Physician: HEARD, Chrissie NoaWILLIAM Other Clinician: Referring Physician: HEARD, Chrissie NoaWILLIAM Treating Physician/Extender: Rudene ReBritto, Errol Weeks in Treatment: 0 Height (in): 68 Weight (lbs): 385 Body Mass Index (BMI): 58.5 Nutrition Risk Assessment Items NUTRITION RISK SCREEN: I have an illness or condition that made me change the kind and/or 0 No amount of food I eat I eat fewer than two meals per day 0 No I eat few fruits and vegetables, or milk products 0 No I have three or more drinks of beer, liquor or wine almost every day 0 No I have tooth or mouth problems that make it hard for me to eat 0 No I don't always have enough money to buy the food I need 0 No I eat alone most of the time 0 No I take three or more different prescribed or over-the-counter drugs a 1 Yes day Without wanting to, I have lost or gained 10 pounds in the last six 0 No months I am not always physically able to shop, cook and/or feed myself 0 No Nutrition Protocols Good Risk Protocol Moderate Risk Protocol Electronic Signature(s) Signed: 07/04/2014 2:45:57 PM By: Curtis Sitesorthy, Joanna Entered By: Curtis Sitesorthy, Joanna on 07/04/2014 11:34:24

## 2014-07-04 NOTE — Progress Notes (Addendum)
[name], [name] ([id number]) Visit Report for 07/04/2014 Allergy List Details Patient Name: DIMARCO, MINKIN. Date of Service: 07/04/2014 11:15 AM Medical Record Number: [id number] Patient Account Number: [id number] Date of Birth/Sex: 12-Jan-1946 (69 y.o. Male) Treating RN: Curtis Sites Primary Care Physician: HEARD, Chrissie Noa Other Clinician: Referring Physician: HEARD, Chrissie Noa Treating Physician/Extender: Rudene Re in Treatment: 0 Allergies Active Allergies aspirin Allergy Notes Electronic Signature(s) Signed: 07/04/2014 2:45:57 PM By: Curtis Sites Entered By: Curtis Sites on 07/04/2014 11:26:12 [place], Jorje Guild ([id number]) -------------------------------------------------------------------------------- Arrival Information Details Patient Name: [name]. Date of Service: 07/04/2014 11:15 AM Medical Record Number: [id number] Patient Account Number: [id number] Date of Birth/Sex: 05/18/1945 (69 y.o. Male) Treating RN: Curtis Sites Primary Care Physician: HEARD, Chrissie Noa Other Clinician: Referring Physician: HEARD, Chrissie Noa Treating Physician/Extender: Rudene Re in Treatment: 0 Visit Information Patient Arrived: Danella Maiers Time: 11:16 Accompanied By: spouse Transfer Assistance: None Patient Identification Verified: Yes Secondary Verification Process Yes Completed: Patient Has Alerts: Yes Patient Alerts: 6/7/16ABI L 1.21 R 1.27 History Since Last Visit Added or deleted any medications: No Any new allergies or adverse reactions: No Had a fall or experienced change in activities of daily living that may affect risk of falls: No Signs or symptoms of abuse/neglect since last visito No Hospitalized since last visit: No Electronic Signature(s) Signed: 07/05/2014 3:32:01 PM By: Elliot Gurney RN, BSN, Kim RN, BSN Previous Signature: 07/04/2014 12:03:39 PM Version By: Curtis Sites Entered By: Elliot Gurney RN, BSN, Kim on 07/05/2014 15:32:01 Toruno, Jorje Guild  ([id number]) -------------------------------------------------------------------------------- Clinic Level of Care Assessment Details Patient Name: [name]. Date of Service: 07/04/2014 11:15 AM Medical Record Number: [id number] Patient Account Number: [id number] Date of Birth/Sex: 1945/07/22 (69 y.o. Male) Treating RN: Renee Harder Primary Care Physician: HEARD, Chrissie Noa Other Clinician: Referring Physician: HEARD, Chrissie Noa Treating Physician/Extender: Rudene Re in Treatment: 0 Clinic Level of Care Assessment Items TOOL 1 Quantity Score  - Use when EandM and Procedure is performed on INITIAL visit 0 ASSESSMENTS - Nursing Assessment / Reassessment  - General Physical Exam (combine w/ comprehensive assessment (listed just 0 below) when performed on new pt. evals) X - Comprehensive Assessment (HX, ROS, Risk Assessments, Wounds Hx, etc.) 1 25 ASSESSMENTS - Wound and Skin Assessment / Reassessment  - Dermatologic / Skin Assessment (not related to wound area) 0 ASSESSMENTS - Ostomy and/or Continence Assessment and Care  - Incontinence Assessment and Management 0  - Ostomy Care Assessment and Management (repouching, etc.) 0 PROCESS - Coordination of Care X - Simple Patient / Family Education for ongoing care 1 15  - Complex (extensive) Patient / Family Education for ongoing care 0 X - Staff obtains Chiropractor, Records, Test Results / Process Orders 1 10  - Staff telephones HHA, Nursing Homes / Clarify orders / etc 0  - Routine Transfer to another Facility (non-emergent condition) 0  - Routine Hospital Admission (non-emergent condition) 0 X - New Admissions / Manufacturing engineer / Ordering NPWT, Apligraf, etc. 1 15  - Emergency Hospital Admission (emergent condition) 0 PROCESS - Special Needs  - Pediatric / Minor Patient Management 0  - Isolation Patient Management 0 Maser, Greig R. ([id number])  - Hearing / Language / Visual special needs 0   - Assessment of Community assistance (transportation, D/C planning, etc.) 0  - Additional assistance / Altered mentation 0  - Support Surface(s) Assessment (bed, cushion, seat, etc.) 0 INTERVENTIONS - Miscellaneous  - External ear exam 0  - Patient Transfer (multiple staff / Nurse, adult / Similar devices) 0  -  Simple Staple / Suture removal (25 or less) 0  - Complex Staple / Suture removal (26 or more) 0  - Hypo/Hyperglycemic Management (do not check if billed separately) 0 X - Ankle / Brachial Index (ABI) - do not check if billed separately 1 15 Has the patient been seen at the hospital within the last three years: Yes Total Score: 80 Level Of Care: New/Established - Level 3 Electronic Signature(s) Signed: 07/04/2014 4:43:53 PM By: Renee Harder RN Entered By: Renee Harder on 07/04/2014 12:22:52 Liebman, Jorje Guild ([id number]) -------------------------------------------------------------------------------- Encounter Discharge Information Details Patient Name: [name]. Date of Service: 07/04/2014 11:15 AM Medical Record Number: [id number] Patient Account Number: [id number] Date of Birth/Sex: [birth year] (68 y.o. Male) Treating RN: Primary Care Physician: Tawni Levy Other Clinician: Referring Physician: HEARD, Chrissie Noa Treating Physician/Extender: Rudene Re in Treatment: 0 Encounter Discharge Information Items Discharge Pain Level: 0 Discharge Condition: Stable Ambulatory Status: Cane Discharge Destination: Home Transportation: Private Auto Accompanied By: self Schedule Follow-up Appointment: Yes Medication Reconciliation completed No and provided to Patient/Care Ryliee Figge: Provided on Clinical Summary of Care: 07/04/2014 Form Type Recipient Paper Patient GS Electronic Signature(s) Signed: 07/04/2014 12:49:49 PM By: Curtis Sites Previous Signature: 07/04/2014 12:28:51 PM Version By: Gwenlyn Perking Entered By: Curtis Sites on 07/04/2014  12:49:49 Chewning, Jorje Guild ([id number]) -------------------------------------------------------------------------------- Lower Extremity Assessment Details Patient Name: [name]. Date of Service: 07/04/2014 11:15 AM Medical Record Number: [id number] Patient Account Number: [id number] Date of Birth/Sex: [birth year] (69 y.o. Male) Treating RN: Curtis Sites Primary Care Physician: HEARD, Chrissie Noa Other Clinician: Referring Physician: HEARD, Chrissie Noa Treating Physician/Extender: Rudene Re in Treatment: 0 Edema Assessment Assessed: [Left: No] [Right: No] E[Left: dema] [Right: :] Calf Left: Right: Point of Measurement: 36 cm From Medial Instep 44 cm 48 cm Ankle Left: Right: Point of Measurement: 11 cm From Medial Instep 25 cm 29.3 cm Vascular Assessment Pulses: Posterior Tibial Palpable: [Left:Yes] [Right:Yes] Dorsalis Pedis Palpable: [Left:Yes] [Right:Yes] Extremity colors, hair growth, and conditions: Extremity Color: [Left:Hyperpigmented] [Right:Hyperpigmented] Hair Growth on Extremity: [Left:No] [Right:No] Temperature of Extremity: [Left:Warm] [Right:Warm] Capillary Refill: [Left:< 3 seconds] [Right:< 3 seconds] Blood Pressure: Brachial: [Left:132] [Right:124] Dorsalis Pedis: 146 [Left:Dorsalis Pedis: 154] Ankle: Posterior Tibial: 160 [Left:Posterior Tibial: 168 1.21] [Right:1.27] Toe Nail Assessment Left: Right: Thick: No No Discolored: No No Deformed: No No Improper Length and Hygiene: No No Electronic Signature(s) HRIDHAAN, YOHN ([id number]) Signed: 07/04/2014 2:45:57 PM By: Curtis Sites Entered By: Curtis Sites on 07/04/2014 12:00:00 Kulikowski, Jorje Guild ([id number]) -------------------------------------------------------------------------------- Multi Wound Chart Details Patient Name: [name]. Date of Service: 07/04/2014 11:15 AM Medical Record Number: [id number] Patient Account Number: [id number] Date of Birth/Sex: 06/26/1945 (69 y.o.  Male) Treating RN: Renee Harder Primary Care Physician: HEARD, Chrissie Noa Other Clinician: Referring Physician: HEARD, Chrissie Noa Treating Physician/Extender: Rudene Re in Treatment: 0 Vital Signs Height(in): 68 Pulse(bpm): 63 Weight(lbs): 385 Blood Pressure 130/44 (mmHg): Body Mass Index(BMI): 59 Temperature(F): 98.1 Respiratory Rate 20 (breaths/min): Photos: [4:No Photos] [N/A:N/A] Wound Location: [4:Right Malleolus - Lateral] [N/A:N/A] Wounding Event: [4:Trauma] [N/A:N/A] Primary Etiology: [4:Trauma, Other] [N/A:N/A] Comorbid History: [4:Anemia, Sleep Apnea, Hypertension] [N/A:N/A] Date Acquired: [4:06/25/2014] [N/A:N/A] Weeks of Treatment: [4:0] [N/A:N/A] Wound Status: [4:Open] [N/A:N/A] Measurements L x W x D 5.1x3.2x0.2 [N/A:N/A] (cm) Area (cm) : [4:12.818] [N/A:N/A] Volume (cm) : [4:2.564] [N/A:N/A] % Reduction in Area: [4:0.00%] [N/A:N/A] % Reduction in Volume: 0.00% [N/A:N/A] Classification: [4:Full Thickness Without Exposed Support Structures] [N/A:N/A] Exudate Amount: [4:Medium] [N/A:N/A] Exudate Type: [4:Serous] [N/A:N/A] Exudate Color: [4:amber] [N/A:N/A] Wound Margin: [4:Flat and Intact] [N/A:N/A] Granulation Amount: [  4:Small (1-33%)] [N/A:N/A] Granulation Quality: [4:Pink] [N/A:N/A] Necrotic Amount: [4:Large (67-100%)] [N/A:N/A] Exposed Structures: [4:Fascia: No Fat: No Tendon: No Muscle: No Joint: No] [N/A:N/A] Bone: No Limited to Skin Breakdown Epithelialization: None N/A N/A Periwound Skin Texture: Edema: No N/A N/A Excoriation: No Induration: No Callus: No Crepitus: No Fluctuance: No Friable: No Rash: No Scarring: No Periwound Skin Maceration: No N/A N/A Moisture: Moist: No Dry/Scaly: No Periwound Skin Color: Atrophie Blanche: No N/A N/A Cyanosis: No Ecchymosis: No Erythema: No Hemosiderin Staining: No Mottled: No Pallor: No Rubor: No Temperature: No Abnormality N/A N/A Tenderness on No N/A N/A Palpation: Wound  Preparation: Ulcer Cleansing: N/A N/A Rinsed/Irrigated with Saline Topical Anesthetic Applied: Other: idocaine 4% Treatment Notes Electronic Signature(s) Signed: 07/04/2014 4:43:53 PM By: Renee Harder RN Entered By: Renee Harder on 07/04/2014 12:12:58 [name] ([id number]) -------------------------------------------------------------------------------- Multi-Disciplinary Care Plan Details Patient Name: [name]. Date of Service: 07/04/2014 11:15 AM Medical Record Number: [id number] Patient Account Number: [id number] Date of Birth/Sex: 1945/08/18 (69 y.o. Male) Treating RN: Renee Harder Primary Care Physician: HEARD, Chrissie Noa Other Clinician: Referring Physician: HEARD, Chrissie Noa Treating Physician/Extender: Rudene Re in Treatment: 0 Active Inactive Abuse / Safety / Falls / Self Care Management Nursing Diagnoses: Potential for falls Goals: Patient will remain injury free Date Initiated: 07/04/2014 Goal Status: Active Patient/caregiver will verbalize understanding of skin care regimen Date Initiated: 07/04/2014 Goal Status: Active Patient/caregiver will verbalize/demonstrate measures taken to prevent injury and/or falls Date Initiated: 07/04/2014 Goal Status: Active Patient/caregiver will verbalize/demonstrate understanding of what to do in case of emergency Date Initiated: 07/04/2014 Goal Status: Active Interventions: Assess fall risk on admission and as needed Assess: immobility, friction, shearing, incontinence upon admission and as needed Assess impairment of mobility on admission and as needed per policy Assess self care needs on admission and as needed Provide education on basic hygiene Provide education on fall prevention Provide education on safe transfers Notes: Orientation to the Wound Care Program Nursing Diagnoses: Knowledge deficit related to the wound healing center program Goals: MONTERIO, BOB ([id number]) Patient/caregiver will verbalize  understanding of the Wound Healing Center Program Date Initiated: 07/04/2014 Goal Status: Active Interventions: Provide education on orientation to the wound center Notes: Venous Leg Ulcer Nursing Diagnoses: Actual venous Insuffiency (use after diagnosis is confirmed) Knowledge deficit related to disease process and management Goals: Non-invasive venous studies are completed as ordered Date Initiated: 07/04/2014 Goal Status: Active Patient will maintain optimal edema control Date Initiated: 07/04/2014 Goal Status: Active Patient/caregiver will verbalize understanding of disease process and disease management Date Initiated: 07/04/2014 Goal Status: Active Verify adequate tissue perfusion prior to therapeutic compression application Date Initiated: 07/04/2014 Goal Status: Active Interventions: Assess peripheral edema status every visit. Compression as ordered Provide education on venous insufficiency Treatment Activities: Non-invasive vascular studies : 07/04/2014 Test ordered outside of clinic : 07/04/2014 Therapeutic compression applied : 07/04/2014 Notes: Wound/Skin Impairment Nursing Diagnoses: Impaired tissue integrity Knowledge deficit related to ulceration/compromised skin integrity Gerety, Armend R. ([id number]) Goals: Patient/caregiver will verbalize understanding of skin care regimen Date Initiated: 07/04/2014 Goal Status: Active Ulcer/skin breakdown will heal within 14 weeks Date Initiated: 07/04/2014 Goal Status: Active Interventions: Assess patient/caregiver ability to obtain necessary supplies Assess patient/caregiver ability to perform ulcer/skin care regimen upon admission and as needed Provide education on ulcer and skin care Treatment Activities: Skin care regimen initiated : 07/04/2014 Topical wound management initiated : 07/04/2014 Notes: Electronic Signature(s) Signed: 07/04/2014 4:43:53 PM By: Renee Harder RN Entered By: Renee Harder on 07/04/2014 12:12:44 Rorie,  Cranford R. ([id number]) --------------------------------------------------------------------------------  Pain Assessment Details Patient Name: TOBIE, HELLEN. Date of Service: 07/04/2014 11:15 AM Medical Record Number: [id number] Patient Account Number: [id number] Date of Birth/Sex: 05-Aug-1945 (69 y.o. Male) Treating RN: Curtis Sites Primary Care Physician: HEARD, Chrissie Noa Other Clinician: Referring Physician: HEARD, Chrissie Noa Treating Physician/Extender: Rudene Re in Treatment: 0 Active Problems Location of Pain Severity and Description of Pain Patient Has Paino Yes Site Locations Pain Location: Pain in Ulcers With Dressing Change: Yes Duration of the Pain. Constant / Intermittento Intermittent Character of Pain Describe the Pain: Other: tender Pain Management and Medication Current Pain Management: Electronic Signature(s) Signed: 07/04/2014 2:45:57 PM By: Curtis Sites Entered By: Curtis Sites on 07/04/2014 11:22:14 Pinder, Jorje Guild ([id number]) -------------------------------------------------------------------------------- Patient/Caregiver Education Details Patient Name: [name]. Date of Service: 07/04/2014 11:15 AM Medical Record Number: [id number] Patient Account Number: [id number] Date of Birth/Gender: November 17, 1945 (69 y.o. Male) Treating RN: Renee Harder Primary Care Physician: HEARD, Chrissie Noa Other Clinician: Referring Physician: HEARD, Chrissie Noa Treating Physician/Extender: Rudene Re in Treatment: 0 Education Assessment Education Provided To: Patient Education Topics Provided Basic Hygiene: Methods: Explain/Verbal Responses: State content correctly Infection: Methods: Explain/Verbal Responses: State content correctly Safety: Methods: Explain/Verbal Responses: State content correctly Venous: Methods: Explain/Verbal Responses: State content correctly Welcome To The Wound Care Center: Methods: Explain/Verbal Responses: State content  correctly Wound Debridement: Methods: Explain/Verbal Responses: State content correctly Wound/Skin Impairment: Methods: Explain/Verbal Responses: State content correctly Electronic Signature(s) Signed: 07/04/2014 4:43:53 PM By: Renee Harder RN Entered By: Renee Harder on 07/04/2014 12:25:34 Gerads, Jorje Guild ([id number]) [name], Ayron R. ([id number]) -------------------------------------------------------------------------------- Wound Assessment Details Patient Name: [name] Date of Service: 07/04/2014 11:15 AM Medical Record Number: [id number] Patient Account Number: [id number] Date of Birth/Sex: 28-Sep-1945 (69 y.o. Male) Treating RN: Curtis Sites Primary Care Physician: HEARD, Chrissie Noa Other Clinician: Referring Physician: HEARD, Chrissie Noa Treating Physician/Extender: Rudene Re in Treatment: 0 Wound Status Wound Number: 4 Primary Etiology: Trauma, Other Wound Location: Right Malleolus - Lateral Wound Status: Open Wounding Event: Trauma Comorbid Anemia, Sleep Apnea, History: Hypertension Date Acquired: 06/25/2014 Weeks Of Treatment: 0 Clustered Wound: No Photos Wound Measurements Length: (cm) 5.1 Width: (cm) 3.2 Depth: (cm) 0.2 Area: (cm) 12.818 Volume: (cm) 2.564 % Reduction in Area: 0% % Reduction in Volume: 0% Epithelialization: None Tunneling: No Undermining: No Wound Description Full Thickness Without Exposed Foul Odor After Classification: Support Structures Wound Margin: Flat and Intact Exudate Medium Amount: Exudate Type: Serous Exudate Color: amber Cleansing: No Wound Bed Granulation Amount: Small (1-33%) Exposed Structure Granulation Quality: Pink Fascia Exposed: No Necrotic Amount: Large (67-100%) Fat Layer Exposed: No Barro, Graig R. ([id number]) Necrotic Quality: Adherent Slough Tendon Exposed: No Muscle Exposed: No Joint Exposed: No Bone Exposed: No Limited to Skin Breakdown Periwound Skin Texture Texture Color No  Abnormalities Noted: No No Abnormalities Noted: No Callus: No Atrophie Blanche: No Crepitus: No Cyanosis: No Excoriation: No Ecchymosis: No Fluctuance: No Erythema: No Friable: No Hemosiderin Staining: No Induration: No Mottled: No Localized Edema: No Pallor: No Rash: No Rubor: No Scarring: No Temperature / Pain Moisture Temperature: No Abnormality No Abnormalities Noted: No Dry / Scaly: No Maceration: No Moist: No Wound Preparation Ulcer Cleansing: Rinsed/Irrigated with Saline Topical Anesthetic Applied: Other: idocaine 4%, Treatment Notes Wound #4 (Right, Lateral Malleolus) 1. Cleansed with: Clean wound with Normal Saline 2. Anesthetic Topical Lidocaine 4% cream to wound bed prior to debridement 3. Peri-wound Care: Skin Prep 4. Dressing Applied: Aquacel Ag 5. Secondary Dressing Applied Bordered Foam Dressing Electronic Signature(s) Signed: 07/04/2014 12:47:47 PM By: Curtis Sites Entered  By: Curtis Sitesorthy, Joanna on 07/04/2014 12:47:47 Blevens, [name] -------------------------------------------------------------------------------- Vitals Details Patient Name: [name] AlmaSHAFAR, Arend R. Date of Service: 07/04/2014 11:15 AM Medical Record Number: [id number] Patient Account Number: [id number] Date of Birth/Sex: 04/04/1945 38(68 y.o. Male) Treating RN: Curtis Sitesorthy, Joanna Primary Care Physician: HEARD, Chrissie NoaWILLIAM Other Clinician: Referring Physician: HEARD, Chrissie NoaWILLIAM Treating Physician/Extender: Rudene ReBritto, Errol Weeks in Treatment: 0 Vital Signs Time Taken: 11:22 Temperature (F): 98.1 Height (in): 68 Pulse (bpm): 63 Source: Stated Respiratory Rate (breaths/min): 20 Weight (lbs): 385 Blood Pressure (mmHg): 130/44 Source: Stated Reference Range: 80 - 120 mg / dl Body Mass Index (BMI): 58.5 Electronic Signature(s) Signed: 07/04/2014 2:45:57 PM By: Curtis Sitesorthy, Joanna Entered By: Curtis Sitesorthy, Joanna on 07/04/2014 11:25:39

## 2014-07-11 ENCOUNTER — Encounter: Payer: Medicare Other | Admitting: Surgery

## 2014-07-11 DIAGNOSIS — L97312 Non-pressure chronic ulcer of right ankle with fat layer exposed: Secondary | ICD-10-CM | POA: Diagnosis not present

## 2014-07-12 NOTE — Progress Notes (Signed)
ERMIN, PARISIEN ([id number]) Visit Report for 07/11/2014 Arrival Information Details Patient Name: [name], [name]. Date of Service: 07/11/2014 8:45 AM Medical Record Number: [id number] Patient Account Number: [id number] Date of Birth/Sex: 09/07/1945 (69 y.o. Male) Treating RN: Clover Mealy, RN, BSN, [place] Sink Primary Care Physician: HEARD, Chrissie Noa Other Clinician: Referring Physician: HEARD, Chrissie Noa Treating Physician/Extender: Rudene Re in Treatment: 1 Visit Information History Since Last Visit Any new allergies or adverse reactions: No Patient Arrived: [name] Had a fall or experienced change in No Arrival Time: 09:01 activities of daily living that may affect Accompanied By: wife risk of falls: Transfer Assistance: None Signs or symptoms of abuse/neglect since last No Patient Identification Verified: Yes visito Secondary Verification Process Yes Hospitalized since last visit: No Completed: Pain Present Now: No Patient Has Alerts: Yes Patient Alerts: 6/7/16ABI L 1.21 R 1.27 Electronic Signature(s) Signed: 07/11/2014 12:22:51 PM By: Elpidio Eric BSN, RN Entered By: Elpidio Eric on 07/11/2014 09:02:22 [name], [name] ([id number]) -------------------------------------------------------------------------------- Encounter Discharge Information Details Patient Name: [name]. Date of Service: 07/11/2014 8:45 AM Medical Record Number: [id number] Patient Account Number: [id number] Date of Birth/Sex: Mar 14, 1945 (69 y.o. Male) Treating RN: Clover Mealy, RN, BSN, [place] Sink Primary Care Physician: HEARD, Chrissie Noa Other Clinician: Referring Physician: HEARD, Chrissie Noa Treating Physician/Extender: Rudene Re in Treatment: 1 Encounter Discharge Information Items Discharge Pain Level: 0 Discharge Condition: Stable Ambulatory Status: Cane Discharge Destination: Home Transportation: Private Auto Accompanied By: wife Schedule Follow-up Appointment: No Medication Reconciliation  completed No and provided to Patient/Care Haiden Rawlinson: Provided on Clinical Summary of Care: 07/11/2014 Form Type Recipient Paper Patient GS Electronic Signature(s) Signed: 07/11/2014 12:22:51 PM By: Elpidio Eric BSN, RN Previous Signature: 07/11/2014 9:29:29 AM Version By: Gwenlyn Perking Entered By: Elpidio Eric on 07/11/2014 09:34:30 [name], [name] ([id number]) -------------------------------------------------------------------------------- Lower Extremity Assessment Details Patient Name: [name] Pen R. Date of Service: 07/11/2014 8:45 AM Medical Record Number: [id number] Patient Account Number: [id number] Date of Birth/Sex: 02/12/1945 (69 y.o. Male) Treating RN: Clover Mealy, RN, BSN, [place] Sink Primary Care Physician: HEARD, Chrissie Noa Other Clinician: Referring Physician: HEARD, Chrissie Noa Treating Physician/Extender: Rudene Re in Treatment: 1 Edema Assessment Assessed: [Left: No] [Right: No] Edema: [Left: Ye] [Right: s] Calf Left: Right: Point of Measurement: 36 cm From Medial Instep cm 47.8 cm Ankle Left: Right: Point of Measurement: 11 cm From Medial Instep cm 28.7 cm Vascular Assessment Claudication: Claudication Assessment [Right:None] Pulses: Posterior Tibial Dorsalis Pedis Palpable: [Right:Yes] Extremity colors, hair growth, and conditions: Extremity Color: [Right:Hyperpigmented] Hair Growth on Extremity: [Right:No] Temperature of Extremity: [Right:Warm] Capillary Refill: [Right:< 3 seconds] Dependent Rubor: [Right:No] Blanched when Elevated: [Right:No] Lipodermatosclerosis: [Right:No] Toe Nail Assessment Left: Right: Thick: Yes Discolored: Yes Deformed: No Improper Length and Hygiene: Yes [name], [name] ([id number]) Electronic Signature(s) Signed: 07/11/2014 12:22:51 PM By: Elpidio Eric BSN, RN Entered By: Elpidio Eric on 07/11/2014 09:01:00 [name], [name] ([id number]) -------------------------------------------------------------------------------- Multi Wound  Chart Details Patient Name: [name]. Date of Service: 07/11/2014 8:45 AM Medical Record Number: [id number] Patient Account Number: [id number] Date of Birth/Sex: [birth year] (69 y.o. Male) Treating RN: Renee Harder Primary Care Physician: HEARD, Chrissie Noa Other Clinician: Referring Physician: HEARD, Chrissie Noa Treating Physician/Extender: Rudene Re in Treatment: 1 Vital Signs Height(in): 68 Pulse(bpm): 67 Weight(lbs): 385 Blood Pressure 133/67 (mmHg): Body Mass Index(BMI): 59 Temperature(F): 97.7 Respiratory Rate 20 (breaths/min): Photos: [4:No Photos] [N/A:N/A] Wound Location: [4:Right Malleolus - Lateral] [N/A:N/A] Wounding Event: [4:Trauma] [N/A:N/A] Primary Etiology: [4:Trauma, Other] [N/A:N/A] Comorbid History: [4:Anemia, Sleep Apnea, Hypertension] [N/A:N/A] Date Acquired: [4:06/25/2014] [N/A:N/A] Weeks of Treatment: [4:1] [N/A:N/A] Wound Status: [  4:Open] [N/A:N/A] Measurements L x W x D 4x2x0.2 [N/A:N/A] (cm) Area (cm) : [4:6.283] [N/A:N/A] Volume (cm) : [4:1.257] [N/A:N/A] % Reduction in Area: [4:51.00%] [N/A:N/A] % Reduction in Volume: 51.00% [N/A:N/A] Classification: [4:Full Thickness Without Exposed Support Structures] [N/A:N/A] Exudate Amount: [4:Medium] [N/A:N/A] Exudate Type: [4:Serous] [N/A:N/A] Exudate Color: [4:amber] [N/A:N/A] Wound Margin: [4:Flat and Intact] [N/A:N/A] Granulation Amount: [4:Small (1-33%)] [N/A:N/A] Granulation Quality: [4:Pink] [N/A:N/A] Necrotic Amount: [4:Large (67-100%)] [N/A:N/A] Exposed Structures: [4:Fascia: No Fat: No Tendon: No Muscle: No Joint: No] [N/A:N/A] Bone: No Limited to Skin Breakdown Epithelialization: None N/A N/A Periwound Skin Texture: Edema: No N/A N/A Excoriation: No Induration: No Callus: No Crepitus: No Fluctuance: No Friable: No Rash: No Scarring: No Periwound Skin Moist: Yes N/A N/A Moisture: Maceration: No Dry/Scaly: No Periwound Skin Color: Atrophie Blanche: No N/A  N/A Cyanosis: No Ecchymosis: No Erythema: No Hemosiderin Staining: No Mottled: No Pallor: No Rubor: No Temperature: No Abnormality N/A N/A Tenderness on No N/A N/A Palpation: Wound Preparation: Ulcer Cleansing: N/A N/A Rinsed/Irrigated with Saline Topical Anesthetic Applied: Other: lidocaine 4% Treatment Notes Electronic Signature(s) Signed: 07/11/2014 5:14:43 PM By: Renee Harder RN Entered By: Renee Harder on 07/11/2014 09:15:41 App, [name] ([id number]) -------------------------------------------------------------------------------- Multi-Disciplinary Care Plan Details Patient Name: [name]. Date of Service: 07/11/2014 8:45 AM Medical Record Number: [id number] Patient Account Number: [id number] Date of Birth/Sex: Apr 13, 1945 (69 y.o. Male) Treating RN: Renee Harder Primary Care Physician: HEARD, Chrissie Noa Other Clinician: Referring Physician: HEARD, Chrissie Noa Treating Physician/Extender: Rudene Re in Treatment: 1 Active Inactive Abuse / Safety / Falls / Self Care Management Nursing Diagnoses: Potential for falls Goals: Patient will remain injury free Date Initiated: 07/04/2014 Goal Status: Active Patient/caregiver will verbalize understanding of skin care regimen Date Initiated: 07/04/2014 Goal Status: Active Patient/caregiver will verbalize/demonstrate measures taken to prevent injury and/or falls Date Initiated: 07/04/2014 Goal Status: Active Patient/caregiver will verbalize/demonstrate understanding of what to do in case of emergency Date Initiated: 07/04/2014 Goal Status: Active Interventions: Assess fall risk on admission and as needed Assess: immobility, friction, shearing, incontinence upon admission and as needed Assess impairment of mobility on admission and as needed per policy Assess self care needs on admission and as needed Provide education on basic hygiene Provide education on fall prevention Provide education on safe  transfers Treatment Activities: Education provided on Basic Hygiene : 07/04/2014 Notes: Orientation to the Wound Care Program Nursing Diagnoses: [name], [name] ([id number]) Knowledge deficit related to the wound healing center program Goals: Patient/caregiver will verbalize understanding of the Wound Healing Center Program Date Initiated: 07/04/2014 Goal Status: Active Interventions: Provide education on orientation to the wound center Notes: Venous Leg Ulcer Nursing Diagnoses: Actual venous Insuffiency (use after diagnosis is confirmed) Knowledge deficit related to disease process and management Goals: Non-invasive venous studies are completed as ordered Date Initiated: 07/04/2014 Goal Status: Active Patient will maintain optimal edema control Date Initiated: 07/04/2014 Goal Status: Active Patient/caregiver will verbalize understanding of disease process and disease management Date Initiated: 07/04/2014 Goal Status: Active Verify adequate tissue perfusion prior to therapeutic compression application Date Initiated: 07/04/2014 Goal Status: Active Interventions: Assess peripheral edema status every visit. Compression as ordered Provide education on venous insufficiency Treatment Activities: Non-invasive vascular studies : 07/11/2014 Test ordered outside of clinic : 07/11/2014 Therapeutic compression applied : 07/11/2014 Notes: Wound/Skin Impairment Nursing Diagnoses: [name], [name] ([id number]) Impaired tissue integrity Knowledge deficit related to ulceration/compromised skin integrity Goals: Patient/caregiver will verbalize understanding of skin care regimen Date Initiated: 07/04/2014 Goal Status: Active Ulcer/skin breakdown will heal within 14 weeks Date  Initiated: 07/04/2014 Goal Status: Active Interventions: Assess patient/caregiver ability to obtain necessary supplies Assess patient/caregiver ability to perform ulcer/skin care regimen upon admission and as needed Provide  education on ulcer and skin care Treatment Activities: Skin care regimen initiated : 07/11/2014 Topical wound management initiated : 07/11/2014 Notes: Electronic Signature(s) Signed: 07/11/2014 5:14:43 PM By: Renee Harder RN Entered By: Renee Harder on 07/11/2014 09:15:28 [place], [name] ([id number]) -------------------------------------------------------------------------------- Pain Assessment Details Patient Name: [name]. Date of Service: 07/11/2014 8:45 AM Medical Record Number: [id number] Patient Account Number: [id number] Date of Birth/Sex: 1945/07/23 (69 y.o. Male) Treating RN: Clover Mealy, RN, BSN, [place] Sink Primary Care Physician: HEARD, Chrissie Noa Other Clinician: Referring Physician: HEARD, Chrissie Noa Treating Physician/Extender: Rudene Re in Treatment: 1 Active Problems Location of Pain Severity and Description of Pain Patient Has Paino No Site Locations Pain Management and Medication Current Pain Management: Electronic Signature(s) Signed: 07/11/2014 12:22:51 PM By: Elpidio Eric BSN, RN Entered By: Elpidio Eric on 07/11/2014 09:01:44 [name], [name] ([id number]) -------------------------------------------------------------------------------- Patient/Caregiver Education Details Patient Name: [name]. Date of Service: 07/11/2014 8:45 AM Medical Record Number: [id number] Patient Account Number: [id number] Date of Birth/Gender: 1945-09-10 (69 y.o. Male) Treating RN: Renee Harder Primary Care Physician: HEARD, Chrissie Noa Other Clinician: Referring Physician: HEARD, Chrissie Noa Treating Physician/Extender: Rudene Re in Treatment: 1 Education Assessment Education Provided To: Patient Education Topics Provided Basic Hygiene: Methods: Explain/Verbal Responses: State content correctly Safety: Methods: Explain/Verbal Responses: State content correctly Venous: Handouts: Controlling Swelling with Multilayered Compression Wraps Methods:  Explain/Verbal Responses: State content correctly Wound/Skin Impairment: Methods: Explain/Verbal Responses: State content correctly Electronic Signature(s) Signed: 07/11/2014 5:14:43 PM By: Renee Harder RN Entered By: Renee Harder on 07/11/2014 09:21:23 [name], [name] ([id number]) -------------------------------------------------------------------------------- Wound Assessment Details Patient Name: [name] Pen R. Date of Service: 07/11/2014 8:45 AM Medical Record Number: [id number] Patient Account Number: [id number] Date of Birth/Sex: Apr 22, 1945 (69 y.o. Male) Treating RN: Clover Mealy, RN, BSN, [place] Primary Care Physician: HEARD, Chrissie Noa Other Clinician: Referring Physician: HEARD, Chrissie Noa Treating Physician/Extender: Rudene Re in Treatment: 1 Wound Status Wound Number: 4 Primary Etiology: Trauma, Other Wound Location: Right Malleolus - Lateral Wound Status: Open Wounding Event: Trauma Comorbid Anemia, Sleep Apnea, History: Hypertension Date Acquired: 06/25/2014 Weeks Of Treatment: 1 Clustered Wound: No Photos Photo Uploaded By: Elpidio Eric on 07/11/2014 17:04:06 Wound Measurements Length: (cm) 4 Width: (cm) 2 Depth: (cm) 0.2 Area: (cm) 6.283 Volume: (cm) 1.257 % Reduction in Area: 51% % Reduction in Volume: 51% Epithelialization: None Tunneling: No Undermining: No Wound Description Full Thickness Without Exposed Classification: Support Structures Wound Margin: Flat and Intact Exudate Medium Amount: Exudate Type: Serous Exudate Color: amber Foul Odor After Cleansing: No Wound Bed Granulation Amount: Small (1-33%) Exposed Structure Granulation Quality: Pink Fascia Exposed: No Necrotic Amount: Large (67-100%) Fat Layer Exposed: No [name], [name] ([id number]) Necrotic Quality: Adherent Slough Tendon Exposed: No Muscle Exposed: No Joint Exposed: No Bone Exposed: No Limited to Skin Breakdown Periwound Skin Texture Texture Color No Abnormalities  Noted: No No Abnormalities Noted: No Callus: No Atrophie Blanche: No Crepitus: No Cyanosis: No Excoriation: No Ecchymosis: No Fluctuance: No Erythema: No Friable: No Hemosiderin Staining: No Induration: No Mottled: No Localized Edema: No Pallor: No Rash: No Rubor: No Scarring: No Temperature / Pain Moisture Temperature: No Abnormality No Abnormalities Noted: No Dry / Scaly: No Maceration: No Moist: Yes Wound Preparation Ulcer Cleansing: Rinsed/Irrigated with Saline Topical Anesthetic Applied: Other: lidocaine 4%, Treatment Notes Wound #4 (Right, Lateral Malleolus) 1. Cleansed with: Clean wound with Normal Saline 4. Dressing Applied:  Aquacel Ag 5. Secondary Dressing Applied Dry Gauze 7. Secured with Henriette Combs to Right Lower Extremity Electronic Signature(s) Signed: 07/11/2014 12:22:51 PM By: Elpidio Eric BSN, RN Entered By: Elpidio Eric on 07/11/2014 09:01:35 [name] ([id number]) -------------------------------------------------------------------------------- Vitals Details Patient Name: [name]. Date of Service: 07/11/2014 8:45 AM Medical Record Number: [id number] Patient Account Number: [id number] Date of Birth/Sex: 04-27-1945 (69 y.o. Male) Treating RN: Clover Mealy, RN, BSN, [place] Primary Care Physician: HEARD, Chrissie Noa Other Clinician: Referring Physician: HEARD, Chrissie Noa Treating Physician/Extender: Rudene Re in Treatment: 1 Vital Signs Time Taken: 08:59 Temperature (F): 97.7 Height (in): 68 Pulse (bpm): 67 Weight (lbs): 385 Respiratory Rate (breaths/min): 20 Body Mass Index (BMI): 58.5 Blood Pressure (mmHg): 133/67 Reference Range: 80 - 120 mg / dl Electronic Signature(s) Signed: 07/11/2014 12:22:51 PM By: Elpidio Eric BSN, RN Entered By: Elpidio Eric on 07/11/2014 09:00:09

## 2014-07-12 NOTE — Progress Notes (Signed)
NIMROD, MCEVOY ([id number]) Visit Report for 07/11/2014 Chief Complaint Document Details Patient Name: [name], [name]. Date of Service: 07/11/2014 8:45 AM Medical Record Number: [id number] Patient Account Number: [id number] Date of Birth/Sex: 31-Oct-1945 (69 y.o. Male) Treating RN: Primary Care Physician: HEARDChrissie Noa Other Clinician: Referring Physician: HEARD, Chrissie Noa Treating Physician/Extender: Rudene Re in Treatment: 1 Information Obtained from: Patient Chief Complaint Patient presents to the wound care center for a consult due non healing wound. He recently had a injury to his right lower extremity on the lateral part while working in the yard about 2 weeks ago. This progressed to an ulceration and he went to the ED about 4 days ago. Electronic Signature(s) Signed: 07/11/2014 12:20:02 PM By: Evlyn Kanner MD, FACS Entered By: Evlyn Kanner on 07/11/2014 09:21:20 [name] ([id number]) -------------------------------------------------------------------------------- Debridement Details Patient Name: [name]. Date of Service: 07/11/2014 8:45 AM Medical Record Number: [id number] Patient Account Number: [id number] Date of Birth/Sex: 03-11-1945 (69 y.o. Male) Treating RN: Primary Care Physician: HEARDChrissie Noa Other Clinician: Referring Physician: HEARD, Chrissie Noa Treating Physician/Extender: Rudene Re in Treatment: 1 Debridement Performed for Wound #4 Right,Lateral Malleolus Assessment: Performed By: Physician Tristan Schroeder., MD Debridement: Debridement Pre-procedure Yes Verification/Time Out Taken: Start Time: 09:16 Pain Control: Lidocaine 4% Topical Solution Level: Skin/Subcutaneous Tissue/Muscle Total Area Debrided (L x 4 (cm) x 2 (cm) = 8 (cm) W): Tissue and other Viable, Non-Viable, Fibrin/Slough, Subcutaneous material debrided: Instrument: Curette Bleeding: Minimum Hemostasis Achieved: Pressure End Time: 09:18 Procedural Pain:  0 Post Procedural Pain: 0 Response to Treatment: Procedure was tolerated well Post Debridement Measurements of Total Wound Length: (cm) 4 Width: (cm) 2 Depth: (cm) 0.2 Volume: (cm) 1.257 Electronic Signature(s) Signed: 07/11/2014 12:20:02 PM By: Evlyn Kanner MD, FACS Entered By: Evlyn Kanner on 07/11/2014 09:25:52 [name], [name] ([id number]) -------------------------------------------------------------------------------- HPI Details Patient Name: [name]. Date of Service: 07/11/2014 8:45 AM Medical Record Number: [id number] Patient Account Number: [id number] Date of Birth/Sex: February 01, 1945 (69 y.o. Male) Treating RN: Primary Care Physician: Tawni Levy Other Clinician: Referring Physician: HEARD, Chrissie Noa Treating Physician/Extender: Rudene Re in Treatment: 1 History of Present Illness Location: right lower extremity Quality: Patient reports experiencing a dull pain to affected area(s). Severity: Patient states wound are getting worse. Duration: Patient has had the wound for < 2 weeks prior to presenting for treatment Timing: injury occurred while he was working in the yard where he had a laceration on the right lateral aspect of his ankle. Context: The wound occurred when the patient was using a weed Johnell Comings. Modifying Factors: Consults to this date include:going to the ED where he was put on Levaquin and Bactrim and Bactroban. Associated Signs and Symptoms: Patient reports having difficulty standing for long periods. HPI Description: As noted above the patient had a injury due to working in the yard and this rapidly progressed to a ulcerated area with an infection. In October 2014 he did have some vascular studies done but never saw a Careers adviser and never had any surgery for varicose veins. His past medical history is significant for hypertension, obstructive sleep apnea, morbid obesity, stasis dermatitis of both lower extremities and his past surgical history  is suggestive of hip surgery on the right, gastric bypass surgery, cholecystectomy, hip replacement. He has recently been put on Levaquin 500 milligrams daily, Bactrim DS 1 twice a day and Bactroban 2% topical ointment locally. No recent labs or vascular or imaging studies done. 07/11/2014 a venous duplex study was done on 07/05/2014 -- it showed  no evidence of deep vein thrombosis or superficial thrombosis bilaterally. 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 The patient has an appointment to see the vascular surgeons on June 20. Electronic Signature(s) Signed: 07/11/2014 12:20:02 PM By: Evlyn Kanner MD, FACS Entered By: Evlyn Kanner on 07/11/2014 09:23:03 [name] ([id number]) -------------------------------------------------------------------------------- Physical Exam Details Patient Name: [name]. Date of Service: 07/11/2014 8:45 AM Medical Record Number: [id number] Patient Account Number: [id number] Date of Birth/Sex: December 18, 1945 (69 y.o. Male) Treating RN: Primary Care Physician: Tawni Levy Other Clinician: Referring Physician: HEARD, Chrissie Noa Treating Physician/Extender: Rudene Re in Treatment: 1 Constitutional . Pulse regular. Respirations normal and unlabored. Afebrile. . Eyes Nonicteric. Reactive to light. Ears, Nose, Mouth, and Throat Lips, teeth, and gums WNL.Marland Kitchen Moist mucosa without lesions . Neck supple and nontender. No palpable supraclavicular or cervical adenopathy. Normal sized without goiter. Respiratory WNL. No retractions.. Cardiovascular Pedal Pulses WNL. No clubbing, cyanosis or edema. Chest Breasts symmetical and no nipple discharge.. Breast tissue WNL, no masses, lumps, or tenderness.. Musculoskeletal Adexa without tenderness or enlargement.. Digits and nails w/o clubbing, cyanosis, infection, petechiae, ischemia, or inflammatory  conditions.. Integumentary (Hair, Skin) the ulceration has some slough and needs sharp debridement with a curette.Marland Kitchen No crepitus or fluctuance. No peri-wound warmth or erythema. No masses.Marland Kitchen Psychiatric Judgement and insight Intact.. No evidence of depression, anxiety, or agitation.. Electronic Signature(s) Signed: 07/11/2014 12:20:02 PM By: Evlyn Kanner MD, FACS Entered By: Evlyn Kanner on 07/11/2014 09:23:47 [name] ([id number]) -------------------------------------------------------------------------------- Physician Orders Details Patient Name: [name]. Date of Service: 07/11/2014 8:45 AM Medical Record Number: [id number] Patient Account Number: [id number] Date of Birth/Sex: October 29, 1945 (69 y.o. Male) Treating RN: Renee Harder Primary Care Physician: HEARD, Chrissie Noa Other Clinician: Referring Physician: HEARD, Chrissie Noa Treating Physician/Extender: Rudene Re in Treatment: 1 Verbal / Phone Orders: Yes Clinician: Renee Harder Read Back and Verified: Yes Diagnosis Coding Wound Cleansing Wound #4 Right,Lateral Malleolus o Clean wound with Normal Saline. o May Shower, gently pat wound dry prior to applying new dressing. Anesthetic Wound #4 Right,Lateral Malleolus o Topical Lidocaine 4% cream applied to wound bed prior to debridement Primary Wound Dressing Wound #4 Right,Lateral Malleolus o Aquacel Ag Secondary Dressing Wound #4 Right,Lateral Malleolus o Gauze and Kerlix/Conform Dressing Change Frequency Wound #4 Right,Lateral Malleolus o Change dressing every week Follow-up Appointments Wound #4 Right,Lateral Malleolus o Return Appointment in 1 week. Edema Control Wound #4 Right,Lateral Malleolus o Unna Boot to Right Lower Extremity o Elevate legs to the level of the heart and pump ankles as often as possible Electronic Signature(s) Signed: 07/11/2014 12:20:02 PM By: Evlyn Kanner MD, FACS Signed: 07/11/2014 5:14:43 PM By: Renee Harder RN [name], [name] ([id number]) Entered By: Renee Harder on 07/11/2014 09:17:38 [place], [name] ([id number]) -------------------------------------------------------------------------------- Problem List Details Patient Name: [name], [name] Date of Service: 07/11/2014 8:45 AM Medical Record Number: [id number] Patient Account Number: [id number] Date of Birth/Sex: 03-27-1945 (69 y.o. 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 Date of Service: 07/11/2014 8:45 AM Medical Record Number: [id number] Patient Account Number: [id number] Date of Birth/Sex: [birth year] (69 y.o. Male) Treating RN: Primary Care Physician: HEARDChrissie Noa Other Clinician: Referring Physician: HEARD, Chrissie Noa Treating Physician/Extender: Rudene Re in Treatment: 1 Subjective Chief Complaint Information obtained from Patient Patient presents to the wound care center for a consult due non healing wound. He recently had a injury to his right lower extremity on the lateral part while working in the yard about 2 weeks  ago. This progressed to an ulceration and he went to the ED about 4 days ago. History of Present Illness (HPI) The following HPI elements were documented for the patient's wound: Location: right lower extremity Quality: Patient reports experiencing a dull pain to affected area(s). Severity: Patient states wound are getting worse. Duration: Patient has had the wound for < 2 weeks prior to presenting for treatment Timing: injury occurred while he was working in the yard where he had a laceration on the right lateral aspect of his ankle. Context: The wound occurred when the patient was using a weed Johnell Comings. Modifying Factors: Consults to this date include:going to the ED where he was put on Levaquin and Bactrim and Bactroban. Associated Signs and Symptoms: Patient reports having difficulty standing for long periods. As noted above the patient had a injury due to working in the yard and this rapidly progressed to a ulcerated area with an infection. In October 2014 he did have some vascular studies done but never saw a Careers adviser and never had any surgery for varicose veins. His past medical history is significant for hypertension, obstructive sleep apnea, morbid obesity, stasis dermatitis of both lower extremities and his past surgical history is suggestive of hip surgery on the right, gastric bypass surgery, cholecystectomy, hip replacement. He has recently been put on Levaquin 500 milligrams daily, Bactrim DS 1 twice a day and Bactroban 2% topical ointment locally. No recent labs or vascular or imaging studies done. 07/11/2014 a venous duplex study was done on 07/05/2014 -- it showed no evidence of deep vein thrombosis or superficial thrombosis bilaterally. 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 The patient has an appointment to see the vascular surgeons on June 20. [name], [name]  ([id number]) Objective Constitutional Pulse regular. Respirations normal and unlabored. Afebrile. Vitals Time Taken: 8:59 AM, Height: 68 in, Weight: 385 lbs, BMI: 58.5, Temperature: 97.7 F, Pulse: 67 bpm, Respiratory Rate: 20 breaths/min, Blood Pressure: 133/67 mmHg. Eyes Nonicteric. Reactive to light. Ears, Nose, Mouth, and Throat Lips, teeth, and gums WNL.Marland Kitchen Moist mucosa without lesions . Neck supple and nontender. No palpable supraclavicular or cervical adenopathy. Normal sized without goiter. Respiratory WNL. No retractions.. Cardiovascular Pedal Pulses WNL. No clubbing, cyanosis or edema. Chest Breasts symmetical and no nipple discharge.. Breast tissue WNL, no masses, lumps, or tenderness.. Musculoskeletal Adexa without tenderness or enlargement.. Digits and nails w/o clubbing, cyanosis, infection, petechiae, ischemia, or inflammatory conditions.Marland Kitchen Psychiatric Judgement and insight Intact.. No evidence of depression, anxiety, or agitation.. Integumentary (Hair, Skin) the ulceration has some slough and needs sharp debridement with a curette.Marland Kitchen No crepitus or fluctuance. No peri-wound warmth or erythema. No masses.. Wound #4 status is Open. Original cause of wound was Trauma. The wound is located on the Right,Lateral Malleolus. The wound measures 4cm length x 2cm  width x 0.2cm depth; 6.283cm^2 area and 1.257cm^3 volume. The wound is limited to skin breakdown. There is no tunneling or undermining noted. There is a medium amount of serous drainage noted. The wound margin is flat and intact. There is small (1-33%) pink granulation within the wound bed. There is a large (67-100%) amount of necrotic tissue within the wound bed including Adherent Slough. The periwound skin appearance exhibited: Moist. The periwound skin appearance did not exhibit: Callus, Crepitus, Excoriation, Fluctuance, Friable, Induration, Localized Edema, Acton, Alfonso R. ([id number]) Rash, Scarring, Dry/Scaly,  Maceration, Atrophie Blanche, Cyanosis, Ecchymosis, Hemosiderin Staining, Mottled, Pallor, Rubor, Erythema. Periwound temperature was noted as No Abnormality. 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 In the meanwhile I am going to use silver alginate on his wound and using an Unna's boot. He will come back and see me next week. Procedures Wound #4 Wound #4 is a Trauma, Other located on the Right,Lateral Malleolus . There was a Skin/Subcutaneous Tissue/Muscle Debridement ([id number]) debridement with total area of 8 sq cm performed by Derral Colucci, Ignacia Felling., MD. with the following instrument(s): Curette to remove Viable and Non-Viable tissue/material including Fibrin/Slough and Subcutaneous after achieving pain control using Lidocaine 4% Topical Solution. A time out was conducted prior to the start of the procedure. A Minimum amount of bleeding was controlled with Pressure. The procedure was tolerated well with a pain level of 0 throughout and a pain level of 0 following the procedure. Post Debridement Measurements: 4cm length x 2cm width x 0.2cm depth; 1.257cm^3 volume. Plan Wound Cleansing: Wound #4 Right,Lateral Malleolus: Clean wound with Normal Saline. [name], [name] ([id number]) May Shower, gently pat wound dry prior to applying new dressing. Anesthetic: Wound #4 Right,Lateral Malleolus: Topical Lidocaine 4% cream applied to wound bed prior to debridement Primary Wound Dressing: Wound #4 Right,Lateral Malleolus: Aquacel Ag Secondary Dressing: Wound #4  Right,Lateral Malleolus: Gauze and Kerlix/Conform Dressing Change Frequency: Wound #4 Right,Lateral Malleolus: Change dressing every week Follow-up Appointments: Wound #4 Right,Lateral Malleolus: Return Appointment in 1 week. Edema Control: Wound #4 Right,Lateral Malleolus: Unna Boot to Right Lower Extremity Elevate legs to the level of the heart and pump ankles as often as possible Having confirmed that he has bilateral varicose veins we will now set him up for an appointment with the vascular surgeons. In the meanwhile I am going to use silver alginate on his wound and using an Unna's boot. He will come back and see me next week Electronic Signature(s) Signed: 07/11/2014 5:03:08 PM By: Evlyn Kanner MD, FACS Previous Signature: 07/11/2014 12:20:02 PM Version By: Evlyn Kanner MD, FACS Entered By: Evlyn Kanner on 07/11/2014 17:02:39 [name], [name] ([id number]) -------------------------------------------------------------------------------- SuperBill Details Patient Name: [name]. Date of Service: 07/11/2014 Medical Record Number: [id number] Patient Account Number: [id number] Date of Birth/Sex: Jun 04, 1945 (69 y.o. Male) Treating RN: Primary Care Physician: HEARDChrissie Noa Other Clinician: Referring Physician: HEARD, Chrissie Noa Treating Physician/Extender: Rudene Re in Treatment: 1 Diagnosis Coding ICD-10 Codes Code Description [phone] Laceration without foreign body, right lower leg, initial encounter Chronic venous hypertension (idiopathic) with ulcer and inflammation of bilateral lower I87.333 extremity L97.312 Non-pressure chronic ulcer of right  ankle with fat layer exposed E66.01 Morbid (severe) obesity due to excess calories Facility Procedures CPT4: Description Modifier Quantity Code [id number] 11043 - DEB MUSC/FASCIA 20 SQ CM/< 1 ICD-10 Description Diagnosis L97.312 Non-pressure chronic ulcer of right ankle with fat layer exposed I87.333 Chronic venous  hypertension (idiopathic) with ulcer and  inflammation of bilateral lower extremity Physician Procedures CPT4: Description Modifier Quantity Code [id number] 11043 - WC PHYS DEBR MUSCLE/FASCIA 20 SQ CM 1 ICD-10 Description Diagnosis L97.312 Non-pressure chronic ulcer of right ankle with fat layer exposed I87.333 Chronic venous hypertension (idiopathic) with  ulcer and inflammation of bilateral lower extremity Electronic Signature(s) Signed: 07/11/2014 12:20:02 PM By: Evlyn Kanner MD, FACS Entered By: Evlyn Kanner on 07/11/2014 09:26:14

## 2014-07-18 ENCOUNTER — Encounter: Payer: Medicare Other | Admitting: Surgery

## 2014-07-18 DIAGNOSIS — L97312 Non-pressure chronic ulcer of right ankle with fat layer exposed: Secondary | ICD-10-CM | POA: Diagnosis not present

## 2014-07-19 NOTE — Progress Notes (Signed)
[name] ([id number]) Visit Report for 07/18/2014 Chief Complaint Document Details Patient Name: [name], [name]. Date of Service: 07/18/2014 8:45 AM Medical Record Number: [id number] Patient Account Number: [id number] Date of Birth/Sex: September 12, 1945 (69 y.o. Male) Treating RN: Primary Care Physician: HEARDChrissie Noa Other Clinician: Referring Physician: HEARD, Chrissie Noa Treating Physician/Extender: Rudene Re in Treatment: 2 Information Obtained from: Patient Chief Complaint Patient presents to the wound care center for a consult due non healing wound. He recently had a injury to his right lower extremity on the lateral part while working in the yard about 2 weeks ago. This progressed to an ulceration and he went to the ED about 4 days ago. Electronic Signature(s) Signed: 07/18/2014 12:24:35 PM By: Evlyn Kanner MD, FACS Entered By: Evlyn Kanner on 07/18/2014 08:59:06 [name] ([id number]) -------------------------------------------------------------------------------- Debridement Details Patient Name: [name]. Date of Service: 07/18/2014 8:45 AM Medical Record Number: [id number] Patient Account Number: [id number] Date of Birth/Sex: 07/31/1945 (69 y.o. Male) Treating RN: Primary Care Physician: HEARDChrissie Noa Other Clinician: Referring Physician: HEARD, Chrissie Noa Treating Physician/Extender: Rudene Re in Treatment: 2 Debridement Performed for Wound #4 Right,Lateral Malleolus Assessment: Performed By: Physician Tristan Schroeder., MD Debridement: Open Wound/Selective Debridement Selective Description: Pre-procedure Yes Verification/Time Out Taken: Start Time: 08:52 Pain Control: Lidocaine 4% Topical Solution Level: Non-Viable Tissue Total Area Debrided (L x 3.5 (cm) x 1.2 (cm) = 4.2 (cm) W): Tissue and other Non-Viable, Eschar, Fibrin/Slough material debrided: Instrument: Forceps Bleeding: Minimum Hemostasis Achieved: Pressure End Time:  08:55 Procedural Pain: 0 Post Procedural Pain: 0 Response to Treatment: Procedure was tolerated well Post Debridement Measurements of Total Wound Length: (cm) 3.5 Width: (cm) 1.2 Depth: (cm) 0.2 Volume: (cm) 0.66 Electronic Signature(s) Signed: 07/18/2014 12:24:35 PM By: Evlyn Kanner MD, FACS Entered By: Evlyn Kanner on 07/18/2014 09:13:14 [name], [name] ([id number]) -------------------------------------------------------------------------------- HPI Details Patient Name: [name]. Date of Service: 07/18/2014 8:45 AM Medical Record Number: [id number] Patient Account Number: [id number] Date of Birth/Sex: [birth year] (69 y.o. Male) Treating RN: Primary Care Physician: Tawni Levy Other Clinician: Referring Physician: HEARD, Chrissie Noa Treating Physician/Extender: Rudene Re in Treatment: 2 History of Present Illness Location: right lower extremity Quality: Patient reports experiencing a dull pain to affected area(s). Severity: Patient states wound are getting worse. Duration: Patient has had the wound for < 2 weeks prior to presenting for treatment Timing: injury occurred while he was working in the yard where he had a laceration on the right lateral aspect of his ankle. Context: The wound occurred when the patient was using a weed Johnell Comings. Modifying Factors: Consults to this date include:going to the ED where he was put on Levaquin and Bactrim and Bactroban. Associated Signs and Symptoms: Patient reports having difficulty standing for long periods. HPI Description: As noted above the patient had a injury due to working in the yard and this rapidly progressed to a ulcerated area with an infection. In October 2014 he did have some vascular studies done but never saw a Careers adviser and never had any surgery for varicose veins. His past medical history is significant for hypertension, obstructive sleep apnea, morbid obesity, stasis dermatitis of both lower extremities and  his past surgical history is suggestive of hip surgery on the right, gastric bypass surgery, cholecystectomy, hip replacement. He has recently been put on Levaquin 500 milligrams daily, Bactrim DS 1 twice a day and Bactroban 2% topical ointment locally. No recent labs or vascular or imaging studies done. 07/11/2014 a venous duplex study was done on 07/05/2014 --  it showed no evidence of deep vein thrombosis or superficial thrombosis bilaterally. 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 The patient has an appointment to see the vascular surgeons on June 20. 07/18/2014 his appointment with the vascular surgeons is rescheduled for this afternoon. Electronic Signature(s) Signed: 07/18/2014 12:24:35 PM By: Evlyn Kanner MD, FACS Entered By: Evlyn Kanner on 07/18/2014 08:59:45 [name] ([id number]) -------------------------------------------------------------------------------- Physical Exam Details Patient Name: [name]. Date of Service: 07/18/2014 8:45 AM Medical Record Number: [id number] Patient Account Number: [id number] Date of Birth/Sex: 01-Mar-1945 (69 y.o. Male) Treating RN: Primary Care Physician: Tawni Levy Other Clinician: Referring Physician: HEARD, Chrissie Noa Treating Physician/Extender: Rudene Re in Treatment: 2 Constitutional . Pulse regular. Respirations normal and unlabored. Afebrile. . Eyes Nonicteric. Reactive to light. Ears, Nose, Mouth, and Throat Lips, teeth, and gums WNL.Marland Kitchen Moist mucosa without lesions . Neck supple and nontender. No palpable supraclavicular or cervical adenopathy. Normal sized without goiter. Respiratory WNL. No retractions.. Cardiovascular Pedal Pulses WNL. No clubbing, cyanosis or edema. Integumentary (Hair, Skin) his wound on the right lower lateral leg is looking pretty good but has some slough which needs to be sharply debrided.. No crepitus  or fluctuance. No peri-wound warmth or erythema. No masses.Marland Kitchen Psychiatric Judgement and insight Intact.. No evidence of depression, anxiety, or agitation.. Electronic Signature(s) Signed: 07/18/2014 12:24:35 PM By: Evlyn Kanner MD, FACS Entered By: Evlyn Kanner on 07/18/2014 09:10:19 [name] ([id number]) -------------------------------------------------------------------------------- Physician Orders Details Patient Name: [name]. Date of Service: 07/18/2014 8:45 AM Medical Record Number: [id number] Patient Account Number: [id number] Date of Birth/Sex: [birth year] (69 y.o. Male) Treating RN: Renee Harder Primary Care Physician: HEARD, Chrissie Noa Other Clinician: Referring Physician: HEARD, Chrissie Noa Treating Physician/Extender: Rudene Re in Treatment: 2 Verbal / Phone Orders: Yes Clinician: Renee Harder Read Back and Verified: Yes Diagnosis Coding Wound Cleansing Wound #4 Right,Lateral Malleolus o Clean wound with Normal Saline. o May Shower, gently pat wound dry prior to applying new dressing. Anesthetic Wound #4 Right,Lateral Malleolus o Topical Lidocaine 4% cream applied to wound bed prior to debridement Primary Wound Dressing Wound #4 Right,Lateral Malleolus o Prisma Ag Secondary Dressing Wound #4 Right,Lateral Malleolus o Gauze and Kerlix/Conform Dressing Change Frequency Wound #4 Right,Lateral Malleolus o Change dressing every week Follow-up Appointments Wound #4 Right,Lateral Malleolus o Return Appointment in 1 week. Edema Control Wound #4 Right,Lateral Malleolus o Unna Boot to Right Lower Extremity o Elevate legs to the level of the heart and pump ankles as often as possible Electronic Signature(s) Signed: 07/18/2014 12:24:35 PM By: Evlyn Kanner MD, FACS Signed: 07/18/2014 5:19:44 PM By: Renee Harder RN [name], [name] ([id number]) Entered By: Renee Harder on 07/18/2014 08:55:00 Elvin, [name]  ([id number]) -------------------------------------------------------------------------------- Problem List Details Patient Name: [name], [name] Date of Service: 07/18/2014 8:45 AM Medical Record Number: [id number] Patient Account Number: [id number] Date of Birth/Sex: [birth year] (69 y.o. 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 Date of Service: 07/18/2014 8:45 AM Medical Record Number: [id number] Patient Account Number: [id number] Date of Birth/Sex: Jun 30, 1945 (69 y.o. Male) Treating RN: Primary Care Physician: HEARDChrissie Noa Other Clinician: Referring Physician: HEARD, Chrissie Noa Treating Physician/Extender: Rudene Re in Treatment: 2 Subjective Chief Complaint Information obtained from Patient Patient presents to the wound care center for a consult due non healing wound. He recently had a injury to his right lower extremity on the lateral part while working in the yard about 2 weeks ago. This progressed to an ulceration and he went to the ED about 4 days ago. History of Present Illness  (HPI) The following HPI elements were documented for the patient's wound: Location: right lower extremity Quality: Patient reports experiencing a dull pain to affected area(s). Severity: Patient states wound are getting worse. Duration: Patient has had the wound for < 2 weeks prior to presenting for treatment Timing: injury occurred while he was working in the yard where he had a laceration on the right lateral aspect of his ankle. Context: The wound occurred when the patient was using a weed Johnell Comings. Modifying Factors: Consults to this date include:going to the ED where he was put on Levaquin and Bactrim and Bactroban. Associated Signs and Symptoms: Patient reports having difficulty standing for long periods. As noted above the patient had a injury due to working in the yard and this rapidly progressed to a ulcerated area with an infection. In October 2014 he did have some vascular studies done but never saw a Careers adviser and never had any surgery for varicose veins. His past medical history is significant for hypertension, obstructive sleep apnea, morbid obesity, stasis dermatitis of both lower extremities and his past surgical history is suggestive of hip surgery on the right, gastric bypass surgery, cholecystectomy, hip replacement. He has recently been put on Levaquin 500 milligrams daily, Bactrim DS 1 twice a day and Bactroban 2% topical ointment locally. No recent labs or vascular or imaging studies done. 07/11/2014 a venous duplex study was done on 07/05/2014 -- it showed no evidence of deep vein thrombosis or superficial thrombosis bilaterally. 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 The patient has an appointment to see the vascular surgeons on June 20. 07/18/2014 his appointment with the vascular surgeons is rescheduled for this afternoon. [name], [name] ([id number]) Objective Constitutional Pulse  regular. Respirations normal and unlabored. Afebrile. Vitals Time Taken: 8:34 AM, Height: 68 in, Weight: 385 lbs, BMI: 58.5, Temperature: 98.3 F, Pulse: 67 bpm, Respiratory Rate: 20 breaths/min, Blood Pressure: 153/76 mmHg. Eyes Nonicteric. Reactive to light. Ears, Nose, Mouth, and Throat Lips, teeth, and gums WNL.Marland Kitchen Moist mucosa without lesions . Neck supple and nontender. No palpable supraclavicular or cervical adenopathy. Normal sized without goiter. Respiratory WNL. No retractions.. Cardiovascular Pedal Pulses WNL. No clubbing, cyanosis or edema. Psychiatric Judgement and insight Intact.. No evidence of depression, anxiety, or agitation.. Integumentary (Hair, Skin) his wound on the right lower lateral leg is looking pretty good but has some slough which needs to be sharply debrided.. No crepitus or fluctuance. No peri-wound warmth or erythema. No masses.. Wound #4 status is Open. Original cause of wound was Trauma. The wound is located on the Right,Lateral Malleolus. The wound measures 3.5cm length x 1.2cm width x 0.2cm depth; 3.299cm^2 area and 0.66cm^3 volume. The wound is limited to skin breakdown. There is no tunneling  or undermining noted. There is a small amount of serous drainage noted. The wound margin is flat and intact. There is small (1-33%) pink granulation within the wound bed. There is a large (67-100%) amount of necrotic tissue within the wound bed including Adherent Slough. The periwound skin appearance exhibited: Localized Edema, Moist. The periwound skin appearance did not exhibit: Callus, Crepitus, Excoriation, Fluctuance, Friable, Induration, Rash, Scarring, Dry/Scaly, Maceration, Atrophie Blanche, Cyanosis, Ecchymosis, Hemosiderin Staining, Mottled, Pallor, Rubor, Erythema. Periwound temperature was noted as No Abnormality. 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 He has his appointment at his vascular surgery office this afternoon and if he needs to rewrap his Unna's boot he can come back. I have changed over from silver alginate to Prisma AG and will continue with all the instructions regarding elevation of the limbs as often as possible. He will come back and see me next week. Procedures Wound #4 Wound #4 is a Trauma, Other located on the Right,Lateral Malleolus . There was a Non-Viable Tissue Open Wound/Selective [phone]) debridement with total area of 4.2 sq cm performed by [facility] [place], Ignacia Felling., MD. with the following instrument(s): Forceps to remove Non-Viable tissue/material including Fibrin/Slough and Eschar after achieving pain control using Lidocaine 4% Topical Solution. A time out was conducted prior to the start of the procedure. A Minimum amount of bleeding was controlled with Pressure. The procedure was tolerated well with a pain level of 0 throughout and a pain level of 0 following the procedure. Post Debridement Measurements: 3.5cm length x 1.2cm width x 0.2cm depth; 0.66cm^3 volume. Plan Wound Cleansing: Wound #4 Right,Lateral Malleolus: Clean wound with Normal Saline. May Shower, gently pat wound dry prior to applying new dressing. Anesthetic: Wound #4 Right,Lateral Malleolus: Topical Lidocaine 4% cream applied to wound bed prior to debridement Primary Wound Dressing: Wound #4 Right,Lateral Malleolus: Feltner, Ashaz R. ([id number]) Prisma Ag Secondary Dressing: Wound #4 Right,Lateral Malleolus: Gauze and Kerlix/Conform Dressing Change Frequency: Wound #4 Right,Lateral Malleolus: Change dressing  every week Follow-up Appointments: Wound #4 Right,Lateral Malleolus: Return Appointment in 1 week. Edema Control: Wound #4 Right,Lateral Malleolus: Unna Boot to Right Lower Extremity Elevate legs to the level of the heart and pump ankles as often as possible The patient is doing overall very well with his own as boots and has had no issues. He has his appointment at his vascular surgery office this afternoon and if he needs to rewrap his Unna's boot he can come back. I have changed over from silver alginate to Prisma AG and will continue with all the instructions regarding elevation of the limbs as often as possible. He will come back and see me next week. Electronic Signature(s) Signed: 07/18/2014 12:24:35 PM By: Evlyn Kanner MD, FACS Entered By: Evlyn Kanner on 07/18/2014 09:13:28 [name] ([id number]) -------------------------------------------------------------------------------- SuperBill Details Patient Name: [name]. Date of Service: 07/18/2014 Medical Record Number: [id number] Patient Account Number: [id number] Date of Birth/Sex: November 09, 1945 (70 y.o. Male) Treating RN: Primary Care Physician: Tawni Levy Other Clinician: Referring Physician: HEARD, Chrissie Noa Treating Physician/Extender: Rudene Re in Treatment: 2 Diagnosis Coding ICD-10 Codes Code Description [phone] Laceration without foreign body,  right lower leg, initial encounter Chronic venous hypertension (idiopathic) with ulcer and inflammation of bilateral lower I87.333 extremity L97.312 Non-pressure chronic ulcer of right ankle with fat layer exposed E66.01 Morbid (severe) obesity due to excess calories Facility Procedures CPT4 Code Description: [id number] - DEBRIDE WOUND 1ST 20 SQ CM OR < ICD-10 Description Diagnosis L97.312 Non-pressure chronic ulcer of right ankle with fat lay S81.811A Laceration without foreign body, right lower leg, init Modifier: er exposed ial  encounte Quantity: 1 r Physician Procedures CPT4 Code Description: [id number] - WC PHYS DEBR WO ANESTH 20 SQ CM ICD-10 Description Diagnosis L97.312 Non-pressure chronic ulcer of right ankle with fat lay S81.811A Laceration without foreign body, right lower leg, init Modifier: er exposed ial encounte Quantity: 1 r Electronic Signature(s) Signed: 07/18/2014 12:24:35 PM By: Evlyn Kanner MD, FACS Entered By: Evlyn Kanner on 07/18/2014 09:12:56

## 2014-07-19 NOTE — Progress Notes (Signed)
THADIS, BRODIE ([id number]) Visit Report for 07/18/2014 Arrival Information Details Patient Name: [name], [name]. Date of Service: 07/18/2014 8:45 AM Medical Record Number: [id number] Patient Account Number: [id number] Date of Birth/Sex: 23-Feb-1945 (69 y.o. Male) Treating RN: Clover Mealy, RN, BSN, [place] Sink Primary Care Physician: HEARD, Chrissie Noa Other Clinician: Referring Physician: HEARD, Chrissie Noa Treating Physician/Extender: Rudene Re in Treatment: 2 Visit Information History Since Last Visit Any new allergies or adverse reactions: No Patient Arrived: [name] Had a fall or experienced change in No Arrival Time: 08:35 activities of daily living that may affect Accompanied By: wife risk of falls: Transfer Assistance: None Signs or symptoms of abuse/neglect since last No Patient Identification Verified: Yes visito Secondary Verification Process Yes Hospitalized since last visit: No Completed: Has Dressing in Place as Prescribed: Yes Patient Has Alerts: Yes Has Compression in Place as Prescribed: Yes Patient Alerts: 6/7/16ABI L 1.21 R Pain Present Now: No 1.27 Electronic Signature(s) Signed: 07/18/2014 4:04:52 PM By: Elpidio Eric BSN, RN Entered By: Elpidio Eric on 07/18/2014 08:35:19 [name] ([id number]) -------------------------------------------------------------------------------- Encounter Discharge Information Details Patient Name: [name]. Date of Service: 07/18/2014 8:45 AM Medical Record Number: [id number] Patient Account Number: [id number] Date of Birth/Sex: [birth year] (69 y.o. Male) Treating RN: Clover Mealy, RN, BSN, [place] Sink Primary Care Physician: HEARD, Chrissie Noa Other Clinician: Referring Physician: HEARD, Chrissie Noa Treating Physician/Extender: Rudene Re in Treatment: 2 Encounter Discharge Information Items Discharge Pain Level: 0 Discharge Condition: Stable Ambulatory Status: Cane Discharge Destination: Home Transportation: Private Auto Accompanied  By: wife Schedule Follow-up Appointment: No Medication Reconciliation completed No and provided to Patient/Care Catricia Scheerer: Provided on Clinical Summary of Care: 07/18/2014 Form Type Recipient Paper Patient GS Electronic Signature(s) Signed: 07/18/2014 4:04:52 PM By: Elpidio Eric BSN, RN Previous Signature: 07/18/2014 9:07:45 AM Version By: Gwenlyn Perking Entered By: Elpidio Eric on 07/18/2014 09:13:06 [name] ([id number]) -------------------------------------------------------------------------------- Lower Extremity Assessment Details Patient Name: [name] Pen R. Date of Service: 07/18/2014 8:45 AM Medical Record Number: [id number] Patient Account Number: [id number] Date of Birth/Sex: 1946/01/08 (69 y.o. Male) Treating RN: Clover Mealy, RN, BSN, [place] Sink Primary Care Physician: HEARD, Chrissie Noa Other Clinician: Referring Physician: HEARD, Chrissie Noa Treating Physician/Extender: Rudene Re in Treatment: 2 Edema Assessment Assessed: Kyra Searles: No] [Right: No] E[Left: dema] [Right: :] Calf Left: Right: Point of Measurement: 36 cm From Medial Instep cm 47.5 cm Ankle Left: Right: Point of Measurement: 11 cm From Medial Instep cm 28.2 cm Vascular Assessment Claudication: Claudication Assessment [Right:None] Pulses: Posterior Tibial Dorsalis Pedis Palpable: [Right:Yes] Extremity colors, hair growth, and conditions: Extremity Color: [Right:Hyperpigmented] Hair Growth on Extremity: [Right:No] Temperature of Extremity: [Right:Warm] Capillary Refill: [Right:< 3 seconds] Dependent Rubor: [Right:No] Blanched when Elevated: [Right:No] Lipodermatosclerosis: [Right:No] Toe Nail Assessment Left: Right: Thick: Yes Discolored: Yes Deformed: No Improper Length and Hygiene: No [name], [name] ([id number]) Electronic Signature(s) Signed: 07/18/2014 4:04:52 PM By: Elpidio Eric BSN, RN Entered By: Elpidio Eric on 07/18/2014 08:42:36 [name]  ([id number]) -------------------------------------------------------------------------------- Multi Wound Chart Details Patient Name: [name]. Date of Service: 07/18/2014 8:45 AM Medical Record Number: [id number] Patient Account Number: [id number] Date of Birth/Sex: 1945-09-05 (69 y.o. Male) Treating RN: Renee Harder Primary Care Physician: HEARD, Chrissie Noa Other Clinician: Referring Physician: HEARD, Chrissie Noa Treating Physician/Extender: Rudene Re in Treatment: 2 Vital Signs Height(in): 68 Pulse(bpm): 67 Weight(lbs): 385 Blood Pressure 153/76 (mmHg): Body Mass Index(BMI): 59 Temperature(F): 98.3 Respiratory Rate 20 (breaths/min): Photos: [4:No Photos] [N/A:N/A] Wound Location: [4:Right Malleolus - Lateral] [N/A:N/A] Wounding Event: [4:Trauma] [N/A:N/A] Primary Etiology: [4:Trauma, Other] [N/A:N/A] Comorbid History: [4:Anemia, Sleep Apnea,  Hypertension] [N/A:N/A] Date Acquired: [4:06/25/2014] [N/A:N/A] Weeks of Treatment: [4:2] [N/A:N/A] Wound Status: [4:Open] [N/A:N/A] Measurements L x W x D 3.5x1.2x0.2 [N/A:N/A] (cm) Area (cm) : [4:3.299] [N/A:N/A] Volume (cm) : [4:0.66] [N/A:N/A] % Reduction in Area: [4:74.30%] [N/A:N/A] % Reduction in Volume: 74.30% [N/A:N/A] Classification: [4:Full Thickness Without Exposed Support Structures] [N/A:N/A] Exudate Amount: [4:Small] [N/A:N/A] Exudate Type: [4:Serous] [N/A:N/A] Exudate Color: [4:amber] [N/A:N/A] Wound Margin: [4:Flat and Intact] [N/A:N/A] Granulation Amount: [4:Small (1-33%)] [N/A:N/A] Granulation Quality: [4:Pink] [N/A:N/A] Necrotic Amount: [4:Large (67-100%)] [N/A:N/A] Exposed Structures: [4:Fascia: No Fat: No Tendon: No Muscle: No Joint: No] [N/A:N/A] Bone: No Limited to Skin Breakdown Epithelialization: Medium (34-66%) N/A N/A Periwound Skin Texture: Edema: Yes N/A N/A Excoriation: No Induration: No Callus: No Crepitus: No Fluctuance: No Friable: No Rash: No Scarring: No Periwound Skin  Moist: Yes N/A N/A Moisture: Maceration: No Dry/Scaly: No Periwound Skin Color: Atrophie Blanche: No N/A N/A Cyanosis: No Ecchymosis: No Erythema: No Hemosiderin Staining: No Mottled: No Pallor: No Rubor: No Temperature: No Abnormality N/A N/A Tenderness on No N/A N/A Palpation: Wound Preparation: Ulcer Cleansing: Other: N/A N/A soap and water Topical Anesthetic Applied: Other: lidocaine 4% Treatment Notes Electronic Signature(s) Signed: 07/18/2014 5:19:44 PM By: Renee Harder RN Entered By: Renee Harder on 07/18/2014 08:52:11 [name], [name] ([id number]) -------------------------------------------------------------------------------- Multi-Disciplinary Care Plan Details Patient Name: [name]. Date of Service: 07/18/2014 8:45 AM Medical Record Number: [id number] Patient Account Number: [id number] Date of Birth/Sex: 07-Jan-1946 (69 y.o. Male) Treating RN: Renee Harder Primary Care Physician: HEARD, Chrissie Noa Other Clinician: Referring Physician: HEARD, Chrissie Noa Treating Physician/Extender: Rudene Re in Treatment: 2 Active Inactive Abuse / Safety / Falls / Self Care Management Nursing Diagnoses: Potential for falls Goals: Patient will remain injury free Date Initiated: 07/04/2014 Goal Status: Active Patient/caregiver will verbalize understanding of skin care regimen Date Initiated: 07/04/2014 Goal Status: Active Patient/caregiver will verbalize/demonstrate measures taken to prevent injury and/or falls Date Initiated: 07/04/2014 Goal Status: Active Patient/caregiver will verbalize/demonstrate understanding of what to do in case of emergency Date Initiated: 07/04/2014 Goal Status: Active Interventions: Assess fall risk on admission and as needed Assess: immobility, friction, shearing, incontinence upon admission and as needed Assess impairment of mobility on admission and as needed per policy Assess self care needs on admission and as needed Provide  education on basic hygiene Provide education on fall prevention Provide education on safe transfers Treatment Activities: Education provided on Basic Hygiene : 07/04/2014 Notes: Orientation to the Wound Care Program Nursing Diagnoses: FIN, HUPP ([id number]) Knowledge deficit related to the wound healing center program Goals: Patient/caregiver will verbalize understanding of the Wound Healing Center Program Date Initiated: 07/04/2014 Goal Status: Active Interventions: Provide education on orientation to the wound center Notes: Venous Leg Ulcer Nursing Diagnoses: Actual venous Insuffiency (use after diagnosis is confirmed) Knowledge deficit related to disease process and management Goals: Non-invasive venous studies are completed as ordered Date Initiated: 07/04/2014 Goal Status: Active Patient will maintain optimal edema control Date Initiated: 07/04/2014 Goal Status: Active Patient/caregiver will verbalize understanding of disease process and disease management Date Initiated: 07/04/2014 Goal Status: Active Verify adequate tissue perfusion prior to therapeutic compression application Date Initiated: 07/04/2014 Goal Status: Active Interventions: Assess peripheral edema status every visit. Compression as ordered Provide education on venous insufficiency Treatment Activities: Non-invasive vascular studies : 07/18/2014 Test ordered outside of clinic : 07/18/2014 Therapeutic compression applied : 07/18/2014 Notes: Wound/Skin Impairment Nursing Diagnoses: JACEK, COLSON ([id number]) Impaired tissue integrity Knowledge deficit related to ulceration/compromised skin integrity Goals: Patient/caregiver will verbalize understanding of skin care  regimen Date Initiated: 07/04/2014 Goal Status: Active Ulcer/skin breakdown will heal within 14 weeks Date Initiated: 07/04/2014 Goal Status: Active Interventions: Assess patient/caregiver ability to obtain necessary supplies Assess  patient/caregiver ability to perform ulcer/skin care regimen upon admission and as needed Provide education on ulcer and skin care Treatment Activities: Skin care regimen initiated : 07/18/2014 Topical wound management initiated : 07/18/2014 Notes: Electronic Signature(s) Signed: 07/18/2014 5:19:44 PM By: Renee Harder RN Entered By: Renee Harder on 07/18/2014 08:51:57 [name], [name] ([id number]) -------------------------------------------------------------------------------- Pain Assessment Details Patient Name: [name]. Date of Service: 07/18/2014 8:45 AM Medical Record Number: [id number] Patient Account Number: [id number] Date of Birth/Sex: 04-22-1945 (69 y.o. Male) Treating RN: Clover Mealy, RN, BSN, [place] Sink Primary Care Physician: HEARD, Chrissie Noa Other Clinician: Referring Physician: HEARD, Chrissie Noa Treating Physician/Extender: Rudene Re in Treatment: 2 Active Problems Location of Pain Severity and Description of Pain Patient Has Paino No Site Locations Pain Management and Medication Current Pain Management: Electronic Signature(s) Signed: 07/18/2014 4:04:52 PM By: Elpidio Eric BSN, RN Entered By: Elpidio Eric on 07/18/2014 08:35:24 [name] ([id number]) -------------------------------------------------------------------------------- Patient/Caregiver Education Details Patient Name: [name]. Date of Service: 07/18/2014 8:45 AM Medical Record Number: [id number] Patient Account Number: [id number] Date of Birth/Gender: 02-16-1945 (69 y.o. Male) Treating RN: Clover Mealy, RN, BSN, [place] Sink Primary Care Physician: HEARD, Chrissie Noa Other Clinician: Referring Physician: HEARD, Chrissie Noa Treating Physician/Extender: Rudene Re in Treatment: 2 Education Assessment Education Provided To: Patient Education Topics Provided Venous: Methods: Explain/Verbal Responses: State content correctly Electronic Signature(s) Signed: 07/18/2014 4:04:52 PM By: Elpidio Eric BSN,  RN Entered By: Elpidio Eric on 07/18/2014 09:13:26 [name], [name] ([id number]) -------------------------------------------------------------------------------- Wound Assessment Details Patient Name: [name]. Date of Service: 07/18/2014 8:45 AM Medical Record Number: [id number] Patient Account Number: [id number] Date of Birth/Sex: 09/22/1945 (69 y.o. Male) Treating RN: Clover Mealy, RN, BSN, Rita Primary Care Physician: HEARD, Chrissie Noa Other Clinician: Referring Physician: HEARD, Chrissie Noa Treating Physician/Extender: Rudene Re in Treatment: 2 Wound Status Wound Number: 4 Primary Etiology: Trauma, Other Wound Location: Right Malleolus - Lateral Wound Status: Open Wounding Event: Trauma Comorbid Anemia, Sleep Apnea, History: Hypertension Date Acquired: 06/25/2014 Weeks Of Treatment: 2 Clustered Wound: No Photos Photo Uploaded By: Elpidio Eric on 07/18/2014 15:51:21 Wound Measurements Length: (cm) 3.5 Width: (cm) 1.2 Depth: (cm) 0.2 Area: (cm) 3.299 Volume: (cm) 0.66 % Reduction in Area: 74.3% % Reduction in Volume: 74.3% Epithelialization: Medium (34-66%) Tunneling: No Undermining: No Wound Description Full Thickness Without Exposed Classification: Support Structures Wound Margin: Flat and Intact Exudate Small Amount: Exudate Type: Serous Exudate Color: amber Foul Odor After Cleansing: No Wound Bed Granulation Amount: Small (1-33%) Exposed Structure Granulation Quality: Pink Fascia Exposed: No Necrotic Amount: Large (67-100%) Fat Layer Exposed: No [name], [name] ([id number]) Necrotic Quality: Adherent Slough Tendon Exposed: No Muscle Exposed: No Joint Exposed: No Bone Exposed: No Limited to Skin Breakdown Periwound Skin Texture Texture Color No Abnormalities Noted: No No Abnormalities Noted: No Callus: No Atrophie Blanche: No Crepitus: No Cyanosis: No Excoriation: No Ecchymosis: No Fluctuance: No Erythema: No Friable: No Hemosiderin  Staining: No Induration: No Mottled: No Localized Edema: Yes Pallor: No Rash: No Rubor: No Scarring: No Temperature / Pain Moisture Temperature: No Abnormality No Abnormalities Noted: No Dry / Scaly: No Maceration: No Moist: Yes Wound Preparation Ulcer Cleansing: Other: soap and water, Topical Anesthetic Applied: Other: lidocaine 4%, Treatment Notes Wound #4 (Right, Lateral Malleolus) 1. Cleansed with: Cleanse wound with antibacterial soap and water 2. Anesthetic Topical Lidocaine 4% cream to wound bed prior to  debridement 5. Secondary Dressing Applied Dry Gauze 7. Secured with Henriette Combs to Right Lower Extremity Electronic Signature(s) Signed: 07/18/2014 4:04:52 PM By: Elpidio Eric BSN, RN Entered By: Elpidio Eric on 07/18/2014 08:45:05 [name], [name] ([id number]) -------------------------------------------------------------------------------- Vitals Details Patient Name: [name]. Date of Service: 07/18/2014 8:45 AM Medical Record Number: [id number] Patient Account Number: [id number] Date of Birth/Sex: 10/23/1945 (69 y.o. Male) Treating RN: Clover Mealy, RN, BSN, Rita Primary Care Physician: HEARD, Chrissie Noa Other Clinician: Referring Physician: HEARD, Chrissie Noa Treating Physician/Extender: Rudene Re in Treatment: 2 Vital Signs Time Taken: 08:34 Temperature (F): 98.3 Height (in): 68 Pulse (bpm): 67 Weight (lbs): 385 Respiratory Rate (breaths/min): 20 Body Mass Index (BMI): 58.5 Blood Pressure (mmHg): 153/76 Reference Range: 80 - 120 mg / dl Electronic Signature(s) Signed: 07/18/2014 4:04:52 PM By: Elpidio Eric BSN, RN Entered By: Elpidio Eric on 07/18/2014 08:35:38

## 2014-07-25 ENCOUNTER — Encounter: Payer: Medicare Other | Admitting: Surgery

## 2014-07-25 DIAGNOSIS — L97312 Non-pressure chronic ulcer of right ankle with fat layer exposed: Secondary | ICD-10-CM | POA: Diagnosis not present

## 2014-07-26 NOTE — Progress Notes (Signed)
ULIS, KAPS ([id number]) Visit Report for 07/25/2014 Arrival Information Details Patient Name: [name], [name]. Date of Service: 07/25/2014 10:15 AM Medical Record Number: [id number] Patient Account Number: [id number] Date of Birth/Sex: September 23, 1945 (69 y.o. Male) Treating RN: Clover Mealy, RN, BSN, [place] Sink Primary Care Physician: HEARD, Chrissie Noa Other Clinician: Referring Physician: HEARD, Chrissie Noa Treating Physician/Extender: Rudene Re in Treatment: 3 Visit Information History Since Last Visit Any new allergies or adverse reactions: No Patient Arrived: [name] Had a fall or experienced change in No Arrival Time: 10:46 activities of daily living that may affect Accompanied By: wife risk of falls: Transfer Assistance: None Signs or symptoms of abuse/neglect since last No Patient Identification Verified: Yes visito Secondary Verification Process Yes Hospitalized since last visit: No Completed: Has Dressing in Place as Prescribed: Yes Patient Has Alerts: Yes Has Compression in Place as Prescribed: Yes Patient Alerts: 6/7/16ABI L 1.21 R Pain Present Now: No 1.27 Electronic Signature(s) Signed: 07/25/2014 4:21:14 PM By: Elpidio Eric BSN, RN Entered By: Elpidio Eric on 07/25/2014 10:46:52 [name], [name] ([id number]) -------------------------------------------------------------------------------- Encounter Discharge Information Details Patient Name: [name]. Date of Service: 07/25/2014 10:15 AM Medical Record Number: [id number] Patient Account Number: [id number] Date of Birth/Sex: 09-05-1945 (69 y.o. Male) Treating RN: Clover Mealy, RN, BSN, [place] Sink Primary Care Physician: HEARD, Chrissie Noa Other Clinician: Referring Physician: HEARD, Chrissie Noa Treating Physician/Extender: Rudene Re in Treatment: 3 Encounter Discharge Information Items Discharge Pain Level: 0 Discharge Condition: Stable Ambulatory Status: Cane Discharge Destination:  Home Private Transportation: Auto Accompanied By: wife Schedule Follow-up Appointment: No Medication Reconciliation completed and No provided to Patient/Care Betsi Crespi: Clinical Summary of Care: Electronic Signature(s) Signed: 07/25/2014 4:21:14 PM By: Elpidio Eric BSN, RN Entered By: Elpidio Eric on 07/25/2014 11:10:10 [name] ([id number]) -------------------------------------------------------------------------------- Lower Extremity Assessment Details Patient Name: [name]. Date of Service: 07/25/2014 10:15 AM Medical Record Number: [id number] Patient Account Number: [id number] Date of Birth/Sex: August 27, 1945 (69 y.o. Male) Treating RN: Clover Mealy, RN, BSN, [place] Sink Primary Care Physician: HEARD, Chrissie Noa Other Clinician: Referring Physician: HEARD, Chrissie Noa Treating Physician/Extender: Rudene Re in Treatment: 3 Edema Assessment Assessed: Kyra Searles: No] [Right: No] E[Left: dema] [Right: :] Calf Left: Right: Point of Measurement: 36 cm From Medial Instep cm 47 cm Ankle Left: Right: Point of Measurement: 11 cm From Medial Instep cm 27.5 cm Vascular Assessment Claudication: Claudication Assessment [Right:None] Pulses: Posterior Tibial Dorsalis Pedis Palpable: [Right:No] Doppler: [Right:Multiphasic] Extremity colors, hair growth, and conditions: Extremity Color: [Right:Mottled] Hair Growth on Extremity: [Right:No] Temperature of Extremity: [Right:Warm] Capillary Refill: [Right:< 3 seconds] Blanched when Elevated: [Right:No] Lipodermatosclerosis: [Right:No] Toe Nail Assessment Left: Right: Thick: Yes Discolored: Yes Deformed: No Improper Length and Hygiene: Yes [name], [name] ([id number]) Electronic Signature(s) Signed: 07/25/2014 4:21:14 PM By: Elpidio Eric BSN, RN Entered By: Elpidio Eric on 07/25/2014 10:54:34 [name], [name] ([id number]) -------------------------------------------------------------------------------- Multi Wound Chart Details Patient  Name: [name]. Date of Service: 07/25/2014 10:15 AM Medical Record Number: [id number] Patient Account Number: [id number] Date of Birth/Sex: 01/02/1946 (69 y.o. Male) Treating RN: Clover Mealy, RN, BSN, [place] Sink Primary Care Physician: HEARD, Chrissie Noa Other Clinician: Referring Physician: HEARD, Chrissie Noa Treating Physician/Extender: Rudene Re in Treatment: 3 Vital Signs Height(in): 68 Pulse(bpm): 74 Weight(lbs): 385 Blood Pressure 140/85 (mmHg): Body Mass Index(BMI): 59 Temperature(F): 98.3 Respiratory Rate 18 (breaths/min): Photos: [4:No Photos] [N/A:N/A] Wound Location: [4:Right Malleolus - Lateral] [N/A:N/A] Wounding Event: [4:Trauma] [N/A:N/A] Primary Etiology: [4:Trauma, Other] [N/A:N/A] Comorbid History: [4:Anemia, Sleep Apnea, Hypertension] [N/A:N/A] Date Acquired: [4:06/25/2014] [N/A:N/A] Weeks of Treatment: [4:3] [N/A:N/A] Wound Status: [4:Open] [N/A:N/A] Measurements L  x W x D 2.5x1x0.2 [N/A:N/A] (cm) Area (cm) : [4:1.963] [N/A:N/A] Volume (cm) : [4:0.393] [N/A:N/A] % Reduction in Area: [4:84.70%] [N/A:N/A] % Reduction in Volume: 84.70% [N/A:N/A] Classification: [4:Full Thickness Without Exposed Support Structures] [N/A:N/A] Exudate Amount: [4:Small] [N/A:N/A] Exudate Type: [4:Serous] [N/A:N/A] Exudate Color: [4:amber] [N/A:N/A] Wound Margin: [4:Flat and Intact] [N/A:N/A] Granulation Amount: [4:Small (1-33%)] [N/A:N/A] Granulation Quality: [4:Pink] [N/A:N/A] Necrotic Amount: [4:Large (67-100%)] [N/A:N/A] Exposed Structures: [4:Fascia: No Fat: No Tendon: No Muscle: No Joint: No] [N/A:N/A] Bone: No Limited to Skin Breakdown Epithelialization: Medium (34-66%) N/A N/A Periwound Skin Texture: Edema: Yes N/A N/A Excoriation: No Induration: No Callus: No Crepitus: No Fluctuance: No Friable: No Rash: No Scarring: No Periwound Skin Moist: Yes N/A N/A Moisture: Maceration: No Dry/Scaly: No Periwound Skin Color: Atrophie Blanche: No N/A N/A Cyanosis:  No Ecchymosis: No Erythema: No Hemosiderin Staining: No Mottled: No Pallor: No Rubor: No Temperature: No Abnormality N/A N/A Tenderness on No N/A N/A Palpation: Wound Preparation: Ulcer Cleansing: Other: N/A N/A soap and water Topical Anesthetic Applied: Other: lidocaine 4% Treatment Notes Electronic Signature(s) Signed: 07/25/2014 4:21:14 PM By: Elpidio EricAfful, [name] BSN, RN Entered By: Elpidio EricAfful, [name] on 07/25/2014 11:01:33 [name], [name] ([id number]) -------------------------------------------------------------------------------- Multi-Disciplinary Care Plan Details Patient Name: [name], [name] Date of Service: 07/25/2014 10:15 AM Medical Record Number: [id number] Patient Account Number: [id number] Date of Birth/Sex: 05/28/1945 37(68 y.o. Male) Treating RN: Clover MealyAfful, RN, BSN, [place] Primary Care Physician: HEARD, Chrissie NoaWILLIAM Other Clinician: Referring Physician: HEARD, Chrissie NoaWILLIAM Treating Physician/Extender: Rudene ReBritto, Errol Weeks in Treatment: 3 Active Inactive Abuse / Safety / Falls / Self Care Management Nursing Diagnoses: Potential for falls Goals: Patient will remain injury free Date Initiated: 07/04/2014 Goal Status: Active Patient/caregiver will verbalize understanding of skin care regimen Date Initiated: 07/04/2014 Goal Status: Active Patient/caregiver will verbalize/demonstrate measures taken to prevent injury and/or falls Date Initiated: 07/04/2014 Goal Status: Active Patient/caregiver will verbalize/demonstrate understanding of what to do in case of emergency Date Initiated: 07/04/2014 Goal Status: Active Interventions: Assess fall risk on admission and as needed Assess: immobility, friction, shearing, incontinence upon admission and as needed Assess impairment of mobility on admission and as needed per policy Assess self care needs on admission and as needed Provide education on basic hygiene Provide education on fall prevention Provide education on safe transfers Treatment  Activities: Education provided on Basic Hygiene : 07/04/2014 Notes: Orientation to the Wound Care Program Nursing Diagnoses: [name], [name] ([id number]) Knowledge deficit related to the wound healing center program Goals: Patient/caregiver will verbalize understanding of the Wound Healing Center Program Date Initiated: 07/04/2014 Goal Status: Active Interventions: Provide education on orientation to the wound center Notes: Venous Leg Ulcer Nursing Diagnoses: Actual venous Insuffiency (use after diagnosis is confirmed) Knowledge deficit related to disease process and management Goals: Non-invasive venous studies are completed as ordered Date Initiated: 07/04/2014 Goal Status: Active Patient will maintain optimal edema control Date Initiated: 07/04/2014 Goal Status: Active Patient/caregiver will verbalize understanding of disease process and disease management Date Initiated: 07/04/2014 Goal Status: Active Verify adequate tissue perfusion prior to therapeutic compression application Date Initiated: 07/04/2014 Goal Status: Active Interventions: Assess peripheral edema status every visit. Compression as ordered Provide education on venous insufficiency Treatment Activities: Non-invasive vascular studies : 07/25/2014 Test ordered outside of clinic : 07/25/2014 Therapeutic compression applied : 07/25/2014 Notes: Wound/Skin Impairment Nursing Diagnoses: [name], [name] ([id number]) Impaired tissue integrity Knowledge deficit related to ulceration/compromised skin integrity Goals: Patient/caregiver will verbalize understanding of skin care regimen Date Initiated: 07/04/2014 Goal Status: Active Ulcer/skin breakdown will heal within 14 weeks  Date Initiated: 07/04/2014 Goal Status: Active Interventions: Assess patient/caregiver ability to obtain necessary supplies Assess patient/caregiver ability to perform ulcer/skin care regimen upon admission and as needed Provide education on ulcer and  skin care Treatment Activities: Skin care regimen initiated : 07/25/2014 Topical wound management initiated : 07/25/2014 Notes: Electronic Signature(s) Signed: 07/25/2014 4:21:14 PM By: Elpidio Eric BSN, RN Entered By: Elpidio Eric on 07/25/2014 11:01:23 [name], [name] ([id number]) -------------------------------------------------------------------------------- Pain Assessment Details Patient Name: [name]. Date of Service: 07/25/2014 10:15 AM Medical Record Number: [id number] Patient Account Number: [id number] Date of Birth/Sex: 1945/03/25 (70 y.o. Male) Treating RN: Clover Mealy, RN, BSN, [place] Sink Primary Care Physician: HEARD, Chrissie Noa Other Clinician: Referring Physician: HEARD, Chrissie Noa Treating Physician/Extender: Rudene Re in Treatment: 3 Active Problems Location of Pain Severity and Description of Pain Patient Has Paino No Site Locations Pain Management and Medication Current Pain Management: Electronic Signature(s) Signed: 07/25/2014 4:21:14 PM By: Elpidio Eric BSN, RN Entered By: Elpidio Eric on 07/25/2014 10:46:57 [name], [name] ([id number]) -------------------------------------------------------------------------------- Patient/Caregiver Education Details Patient Name: [name]. Date of Service: 07/25/2014 10:15 AM Medical Record Number: [id number] Patient Account Number: [id number] Date of Birth/Gender: [birth year] (69 y.o. Male) Treating RN: Clover Mealy, RN, BSN, [place] Sink Primary Care Physician: HEARD, Chrissie Noa Other Clinician: Referring Physician: HEARD, Chrissie Noa Treating Physician/Extender: Rudene Re in Treatment: 3 Education Assessment Education Provided To: Patient and Caregiver Education Topics Provided Basic Hygiene: Methods: Explain/Verbal Responses: State content correctly Safety: Methods: Explain/Verbal Responses: State content correctly Venous: Methods: Explain/Verbal Responses: State content correctly Welcome To The Wound Care  Center: Methods: Explain/Verbal Responses: State content correctly Wound Debridement: Methods: Explain/Verbal Responses: State content correctly Wound/Skin Impairment: Methods: Explain/Verbal Responses: State content correctly Electronic Signature(s) Signed: 07/25/2014 4:21:14 PM By: Elpidio Eric BSN, RN Entered By: Elpidio Eric on 07/25/2014 11:20:19 Kurowski, [name] ([id number]) -------------------------------------------------------------------------------- Wound Assessment Details Patient Name: [name] Pen R. Date of Service: 07/25/2014 10:15 AM Medical Record Number: [id number] Patient Account Number: [id number] Date of Birth/Sex: 03-Jul-1945 (69 y.o. Male) Treating RN: Clover Mealy, RN, BSN, [name] Primary Care Physician: HEARD, Chrissie Noa Other Clinician: Referring Physician: HEARD, Chrissie Noa Treating Physician/Extender: Rudene Re in Treatment: 3 Wound Status Wound Number: 4 Primary Etiology: Trauma, Other Wound Location: Right Malleolus - Lateral Wound Status: Open Wounding Event: Trauma Comorbid Anemia, Sleep Apnea, History: Hypertension Date Acquired: 06/25/2014 Weeks Of Treatment: 3 Clustered Wound: No Photos Photo Uploaded By: Elpidio Eric on 07/25/2014 16:15:37 Wound Measurements Length: (cm) 2.5 Width: (cm) 1 Depth: (cm) 0.2 Area: (cm) 1.963 Volume: (cm) 0.393 % Reduction in Area: 84.7% % Reduction in Volume: 84.7% Epithelialization: Medium (34-66%) Tunneling: No Undermining: No Wound Description Full Thickness Without Exposed Classification: Support Structures Wound Margin: Flat and Intact Exudate Small Amount: Exudate Type: Serous Exudate Color: amber Foul Odor After Cleansing: No Wound Bed Granulation Amount: Small (1-33%) Exposed Structure Granulation Quality: Pink Fascia Exposed: No Necrotic Amount: Large (67-100%) Fat Layer Exposed: No Bodine, Pheng R. ([id number]) Necrotic Quality: Adherent Slough Tendon Exposed: No Muscle Exposed:  No Joint Exposed: No Bone Exposed: No Limited to Skin Breakdown Periwound Skin Texture Texture Color No Abnormalities Noted: No No Abnormalities Noted: No Callus: No Atrophie Blanche: No Crepitus: No Cyanosis: No Excoriation: No Ecchymosis: No Fluctuance: No Erythema: No Friable: No Hemosiderin Staining: No Induration: No Mottled: No Localized Edema: Yes Pallor: No Rash: No Rubor: No Scarring: No Temperature / Pain Moisture Temperature: No Abnormality No Abnormalities Noted: No Dry / Scaly: No Maceration: No Moist: Yes Wound Preparation Ulcer Cleansing: Other: soap and water, Topical Anesthetic  Applied: Other: lidocaine 4%, Treatment Notes Wound #4 (Right, Lateral Malleolus) 3. Peri-wound Care: Moisturizing lotion 4. Dressing Applied: Prisma Ag 7. Secured with Henriette Combs to Right Lower Extremity Electronic Signature(s) Signed: 07/25/2014 4:21:14 PM By: Elpidio Eric BSN, RN Entered By: Elpidio Eric on 07/25/2014 10:57:10 [name] ([id number]) -------------------------------------------------------------------------------- Vitals Details Patient Name: [name]. Date of Service: 07/25/2014 10:15 AM Medical Record Number: [id number] Patient Account Number: [id number] Date of Birth/Sex: 1945-12-02 (69 y.o. Male) Treating RN: Clover Mealy, RN, BSN, [name] Primary Care Physician: HEARD, Chrissie Noa Other Clinician: Referring Physician: HEARD, Chrissie Noa Treating Physician/Extender: Rudene Re in Treatment: 3 Vital Signs Time Taken: 10:47 Temperature (F): 98.3 Height (in): 68 Pulse (bpm): 74 Weight (lbs): 385 Respiratory Rate (breaths/min): 18 Body Mass Index (BMI): 58.5 Blood Pressure (mmHg): 140/85 Reference Range: 80 - 120 mg / dl Electronic Signature(s) Signed: 07/25/2014 4:21:14 PM By: Elpidio Eric BSN, RN Entered By: Elpidio Eric on 07/25/2014 10:47:20

## 2014-07-26 NOTE — Progress Notes (Signed)
BAYAN, KUSHNIR ([id number]) Visit Report for 07/25/2014 Chief Complaint Document Details Patient Name: [name], [name]. Date of Service: 07/25/2014 10:15 AM Medical Record Number: [id number] Patient Account Number: [id number] Date of Birth/Sex: 08/07/1945 (70 y.o. Male) Treating RN: Primary Care Physician: HEARDChrissie Noa Other Clinician: Referring Physician: HEARD, Chrissie Noa Treating Physician/Extender: Rudene Re in Treatment: 3 Information Obtained from: Patient Chief Complaint Patient presents to the wound care center for a consult due non healing wound. He recently had a injury to his right lower extremity on the lateral part while working in the yard about 2 weeks ago. This progressed to an ulceration and he went to the ED about 4 days ago. Electronic Signature(s) Signed: 07/25/2014 12:30:49 PM By: Evlyn Kanner MD, FACS Entered By: Evlyn Kanner on 07/25/2014 11:10:00 [name] ([id number]) -------------------------------------------------------------------------------- Debridement Details Patient Name: [name]. Date of Service: 07/25/2014 10:15 AM Medical Record Number: [id number] Patient Account Number: [id number] Date of Birth/Sex: 01-27-1946 (69 y.o. Male) Treating RN: Primary Care Physician: HEARDChrissie Noa Other Clinician: Referring Physician: HEARD, Chrissie Noa Treating Physician/Extender: Rudene Re in Treatment: 3 Debridement Performed for Wound #4 Right,Lateral Malleolus Assessment: Performed By: Physician Tristan Schroeder., MD Debridement: Debridement Pre-procedure Yes Verification/Time Out Taken: Start Time: 11:05 Pain Control: Lidocaine 4% Topical Solution Level: Skin/Subcutaneous Tissue Total Area Debrided (L x 2.5 (cm) x 1 (cm) = 2.5 (cm) W): Tissue and other Viable, Non-Viable, Eschar, Exudate, Fibrin/Slough, Subcutaneous material debrided: Instrument: Curette Bleeding: Minimum Hemostasis Achieved: Pressure End Time:  11:07 Procedural Pain: 0 Post Procedural Pain: 0 Response to Treatment: Procedure was tolerated well Post Debridement Measurements of Total Wound Length: (cm) 2.5 Width: (cm) 1 Depth: (cm) 0.3 Volume: (cm) 0.589 Electronic Signature(s) Signed: 07/25/2014 12:30:49 PM By: Evlyn Kanner MD, FACS Entered By: Evlyn Kanner on 07/25/2014 11:09:50 Overbey, [name] ([id number]) -------------------------------------------------------------------------------- HPI Details Patient Name: [name]. Date of Service: 07/25/2014 10:15 AM Medical Record Number: [id number] Patient Account Number: [id number] Date of Birth/Sex: February 27, 1945 (69 y.o. Male) Treating RN: Primary Care Physician: Tawni Levy Other Clinician: Referring Physician: HEARD, Chrissie Noa Treating Physician/Extender: Rudene Re in Treatment: 3 History of Present Illness Location: right lower extremity Quality: Patient reports experiencing a dull pain to affected area(s). Severity: Patient states wound are getting worse. Duration: Patient has had the wound for < 2 weeks prior to presenting for treatment Timing: injury occurred while he was working in the yard where he had a laceration on the right lateral aspect of his ankle. Context: The wound occurred when the patient was using a weed Johnell Comings. Modifying Factors: Consults to this date include:going to the ED where he was put on Levaquin and Bactrim and Bactroban. Associated Signs and Symptoms: Patient reports having difficulty standing for long periods. HPI Description: As noted above the patient had a injury due to working in the yard and this rapidly progressed to a ulcerated area with an infection. In October 2014 he did have some vascular studies done but never saw a Careers adviser and never had any surgery for varicose veins. His past medical history is significant for hypertension, obstructive sleep apnea, morbid obesity, stasis dermatitis of both lower extremities and  his past surgical history is suggestive of hip surgery on the right, gastric bypass surgery, cholecystectomy, hip replacement. He has recently been put on Levaquin 500 milligrams daily, Bactrim DS 1 twice a day and Bactroban 2% topical ointment locally. No recent labs or vascular or imaging studies done. 07/11/2014 a venous duplex study was done on 07/05/2014 --  it showed no evidence of deep vein thrombosis or superficial thrombosis bilaterally. 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 The patient has an appointment to see the vascular surgeons on June 20. 07/18/2014 his appointment with the vascular surgeons is rescheduled for this afternoon. 07/25/2014 -- he saw the vascular surgeon and is going to have a endovenous procedure done on July 29. Electronic Signature(s) Signed: 07/25/2014 12:30:49 PM By: Evlyn KannerBritto, Zaylia Riolo MD, FACS Entered By: Evlyn KannerBritto, Harlo Fabela on 07/25/2014 11:10:26 [name] AlmaSHAFAR, [name] ([id number]) -------------------------------------------------------------------------------- Physical Exam Details Patient Name: [name] AlmaSHAFAR, [name] Date of Service: 07/25/2014 10:15 AM Medical Record Number: [id number] Patient Account Number: [id number] Date of Birth/Sex: 10/31/1945 38(68 y.o. Male) Treating RN: Primary Care Physician: Tawni LevyHEARD, WILLIAM Other Clinician: Referring Physician: HEARD, Chrissie NoaWILLIAM Treating Physician/Extender: Rudene ReBritto, Jacquel Redditt Weeks in Treatment: 3 Constitutional . Pulse regular. Respirations normal and unlabored. Afebrile. . Eyes Nonicteric. Reactive to light. Ears, Nose, Mouth, and Throat Lips, teeth, and gums WNL.Marland Kitchen. Moist mucosa without lesions . Neck supple and nontender. No palpable supraclavicular or cervical adenopathy. Normal sized without goiter. Respiratory WNL. No retractions.. Cardiovascular Pedal Pulses WNL. No clubbing, cyanosis or edema. Musculoskeletal Adexa without tenderness or enlargement..  Digits and nails w/o clubbing, cyanosis, infection, petechiae, ischemia, or inflammatory conditions.. Integumentary (Hair, Skin) the ulcerated area on his right lateral lower extremity has some slough and need sharp debridement with a curette.Marland Kitchen. No crepitus or fluctuance. No peri-wound warmth or erythema. No masses.Marland Kitchen. Psychiatric Judgement and insight Intact.. No evidence of depression, anxiety, or agitation.. Electronic Signature(s) Signed: 07/25/2014 12:30:49 PM By: Evlyn KannerBritto, Chastin Riesgo MD, FACS Entered By: Evlyn KannerBritto, Bassem Bernasconi on 07/25/2014 11:11:09 [name] AlmaSHAFAR, [name] ([id number]) -------------------------------------------------------------------------------- Physician Orders Details Patient Name: [name] AlmaSHAFAR, [name] Date of Service: 07/25/2014 10:15 AM Medical Record Number: [id number] Patient Account Number: [id number] Date of Birth/Sex: 11/20/1945 [age] y.o. Male) Treating RN: Clover MealyAfful, RN, BSN, [place] Primary Care Physician: HEARD, Chrissie NoaWILLIAM Other Clinician: Referring Physician: HEARD, Chrissie NoaWILLIAM Treating Physician/Extender: Rudene ReBritto, Cynethia Schindler Weeks in Treatment: 3 Verbal / Phone Orders: Yes Clinician: Afful, RN, BSN, Rita Read Back and Verified: Yes Diagnosis Coding Wound Cleansing Wound #4 Right,Lateral Malleolus o May Shower, gently pat wound dry prior to applying new dressing. o May shower with protection. o No tub bath. Anesthetic Wound #4 Right,Lateral Malleolus o Topical Lidocaine 4% cream applied to wound bed prior to debridement Primary Wound Dressing Wound #4 Right,Lateral Malleolus o Prisma Ag Secondary Dressing Wound #4 Right,Lateral Malleolus o Gauze and Kerlix/Conform Dressing Change Frequency Wound #4 Right,Lateral Malleolus o Change dressing every week Follow-up Appointments Wound #4 Right,Lateral Malleolus o Return Appointment in 1 week. Edema Control Wound #4 Right,Lateral Malleolus o Unna Boot to Right Lower Extremity o Elevate legs to the level of the heart and  pump ankles as often as possible Electronic Signature(s) Signed: 07/25/2014 12:30:49 PM By: Evlyn KannerBritto, Amiyah Shryock MD, FACS Signed: 07/25/2014 4:21:14 PM By: Elpidio EricAfful, Rita BSN, RN [name], [name] ([id number]) Entered By: Elpidio EricAfful, Rita on 07/25/2014 11:08:31 [name], [name] ([id number]) -------------------------------------------------------------------------------- Problem List Details Patient Name: [name], [name] Date of Service: 07/25/2014 10:15 AM Medical Record Number: [id number] Patient Account Number: [id number] Date of Birth/Sex: 10/24/1945 [age] y.o. 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 Date of Service: 07/25/2014 10:15 AM Medical Record Number: [id number] Patient Account Number: [id number] Date of Birth/Sex: Jan 26, 1946 (69 y.o. Male) Treating RN: Primary Care Physician: HEARDChrissie Noa Other Clinician: Referring Physician: HEARD, Chrissie Noa Treating Physician/Extender: Rudene Re in Treatment: 3 Subjective Chief Complaint Information obtained from Patient Patient presents to the wound care  center for a consult due non healing wound. He recently had a injury to his right lower extremity on the lateral part while working in the yard about 2 weeks ago. This progressed to an ulceration and he went to the ED about 4 days ago. History of Present Illness (HPI) The following HPI elements were documented for the patient's wound: Location: right lower extremity Quality: Patient reports experiencing a dull pain to affected area(s). Severity: Patient states wound are getting worse. Duration: Patient has had the wound for < 2 weeks prior to presenting for treatment Timing: injury occurred while he was working in the yard where he had a laceration on the right lateral aspect of his ankle. Context: The wound occurred when the patient was using a weed Johnell Comings. Modifying Factors: Consults to this date include:going to the ED where he was put on Levaquin and Bactrim and Bactroban. Associated Signs and Symptoms: Patient reports having difficulty standing for long periods. As noted above the patient had a injury due to working in the yard and this rapidly progressed to a ulcerated area with an infection. In October 2014 he did have some vascular studies done but never saw a Careers adviser and never had any surgery for varicose veins. His past medical history is significant for hypertension, obstructive sleep apnea, morbid obesity, stasis dermatitis of both lower extremities and his past surgical history is suggestive of hip surgery on the right, gastric bypass surgery, cholecystectomy, hip replacement. He has recently been put on Levaquin 500 milligrams daily, Bactrim DS 1 twice a day and Bactroban 2% topical ointment locally. No recent labs or vascular or imaging studies done. 07/11/2014 a venous duplex study was done on 07/05/2014 -- it showed no evidence of deep vein thrombosis or superficial thrombosis bilaterally. 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 The patient has an appointment to see the vascular surgeons on June 20. 07/18/2014 his appointment with the vascular surgeons is rescheduled for this afternoon. 07/25/2014 -- he saw the vascular surgeon and is going to have a endovenous procedure done on July 29. [name], [name] ([id number]) Objective Constitutional Pulse regular. Respirations normal and unlabored. Afebrile. Vitals Time Taken: 10:47 AM, Height: 68 in, Weight: 385 lbs, BMI: 58.5, Temperature: 98.3 F, Pulse: 74 bpm, Respiratory Rate: 18 breaths/min, Blood Pressure: 140/85 mmHg. Eyes Nonicteric. Reactive to light. Ears, Nose, Mouth, and Throat Lips, teeth, and gums WNL.Marland Kitchen Moist mucosa without lesions . Neck supple and nontender. No palpable supraclavicular or cervical adenopathy. Normal sized without goiter. Respiratory WNL. No retractions.. Cardiovascular Pedal Pulses WNL. No clubbing, cyanosis or edema. Musculoskeletal Adexa without tenderness or enlargement.. Digits and nails w/o clubbing, cyanosis, infection, petechiae, ischemia, or inflammatory conditions.Marland Kitchen Psychiatric Judgement and insight Intact.. No evidence of depression, anxiety, or agitation.. Integumentary (Hair, Skin)  the ulcerated area on his right lateral lower extremity has some slough and need sharp debridement with a curette.Marland Kitchen No crepitus or fluctuance. No peri-wound warmth or erythema. No masses.. Wound #4 status is Open. Original cause of wound was Trauma. The wound is located on the Right,Lateral Malleolus. The wound measures 2.5cm length x 1cm width x 0.2cm depth; 1.963cm^2 area and 0.393cm^3 volume. The wound is limited to skin breakdown. There is no tunneling or undermining noted. There is a small amount of serous drainage noted. The wound margin is flat and intact. There is small (1-33%) pink granulation within the wound bed. There is a large (67-100%) amount of necrotic tissue within the wound bed including  Adherent Slough. The periwound skin appearance exhibited: Localized Edema, Moist. The periwound skin appearance did not exhibit: Callus, Crepitus, Excoriation, Fluctuance, Friable, Induration, Shane, Helix R. ([id number]) Rash, Scarring, Dry/Scaly, Maceration, Atrophie Blanche, Cyanosis, Ecchymosis, Hemosiderin Staining, Mottled, Pallor, Rubor, Erythema. Periwound temperature was noted as No Abnormality. 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 He has a endovenous procedure planned and will be back to see as next Tuesday. After that he is going to be on vacation for a while and so we will alter his treatment plan next week. Procedures Wound #4 Wound #4 is a Trauma, Other located on the Right,Lateral Malleolus . There was a Skin/Subcutaneous Tissue Debridement ([id number]) debridement with total area of 2.5 sq cm performed by Tristan Schroeder., MD. with the following instrument(s): Curette to remove Viable and Non-Viable tissue/material including Exudate, Fibrin/Slough, Eschar, and Subcutaneous after achieving pain control using Lidocaine 4% Topical Solution. A time out was conducted prior to the start of the procedure. A Minimum amount of bleeding was controlled with Pressure. The procedure was tolerated well with a pain level of 0 throughout and a pain level of 0 following the procedure. Post Debridement Measurements: 2.5cm length x 1cm width x 0.3cm depth; 0.589cm^3 volume. Plan Wound Cleansing: Wound #4 Right,Lateral Malleolus: May Shower, gently pat wound dry prior to applying new  dressing. May shower with protection. No tub bath. [name], [name] ([id number]) Anesthetic: Wound #4 Right,Lateral Malleolus: Topical Lidocaine 4% cream applied to wound bed prior to debridement Primary Wound Dressing: Wound #4 Right,Lateral Malleolus: Prisma Ag Secondary Dressing: Wound #4 Right,Lateral Malleolus: Gauze and Kerlix/Conform Dressing Change Frequency: Wound #4 Right,Lateral Malleolus: Change dressing every week Follow-up Appointments: Wound #4 Right,Lateral Malleolus: Return Appointment in 1 week. Edema Control: Wound #4 Right,Lateral Malleolus: Unna Boot to Right Lower Extremity Elevate legs to the level of the heart and pump ankles as often as possible We will continue with the Unna's boots and silver collagen for his right lower extremity. He has a endovenous procedure planned and will be back to see as next Tuesday. After that he is going to be on vacation for a while and so we will alter his treatment plan next week. Electronic Signature(s) Signed: 07/25/2014 12:30:49 PM By: Evlyn Kanner MD, FACS Entered By: Evlyn Kanner on 07/25/2014 11:12:15 [name] ([id number]) -------------------------------------------------------------------------------- SuperBill Details Patient Name: [name]. Date of Service: 07/25/2014 Medical Record Number: [id number] Patient Account Number: [id number] Date of Birth/Sex: May 28, 1945 (68 y.o.  Male) Treating RN: Primary Care Physician: HEARD, Chrissie Noa Other Clinician: Referring Physician: HEARD, Chrissie Noa Treating Physician/Extender: Rudene Re in Treatment: 3 Diagnosis Coding ICD-10 Codes Code Description [phone] Laceration without foreign body, right lower leg, initial encounter Chronic venous hypertension (idiopathic) with ulcer and inflammation of bilateral lower I87.333 extremity L97.312 Non-pressure chronic ulcer of right ankle with fat layer exposed E66.01 Morbid (severe) obesity due to excess  calories Facility Procedures CPT4: Description Modifier Quantity Code [id number] 11042 - DEB SUBQ TISSUE 20 SQ CM/< 1 ICD-10 Description Diagnosis 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 S81.811A Laceration without  foreign body, right lower leg, initial encounter L97.312 Non-pressure chronic ulcer of right ankle with fat layer exposed Physician Procedures CPT4: Description Modifier Quantity Code [id number] 11042 - WC PHYS SUBQ TISS 20 SQ CM 1 ICD-10 Description Diagnosis 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

## 2014-07-30 ENCOUNTER — Ambulatory Visit
Admission: EM | Admit: 2014-07-30 | Discharge: 2014-07-30 | Disposition: A | Discharge status: Home / Self Care | Payer: Medicare Other | Attending: Family Medicine | Admitting: Family Medicine

## 2014-07-30 ENCOUNTER — Encounter: Payer: Self-pay | Admitting: Emergency Medicine

## 2014-07-30 DIAGNOSIS — N400 Enlarged prostate without lower urinary tract symptoms: Secondary | ICD-10-CM | POA: Diagnosis not present

## 2014-07-30 DIAGNOSIS — N390 Urinary tract infection, site not specified: Secondary | ICD-10-CM | POA: Diagnosis not present

## 2014-07-30 DIAGNOSIS — R3915 Urgency of urination: Secondary | ICD-10-CM | POA: Diagnosis present

## 2014-07-30 LAB — URINALYSIS COMPLETE WITH MICROSCOPIC (ARMC ONLY)
Bilirubin Urine: NEGATIVE
Glucose, UA: NEGATIVE mg/dL
Ketones, ur: NEGATIVE mg/dL
Nitrite: NEGATIVE
Protein, ur: NEGATIVE mg/dL
Specific Gravity, Urine: 1.015 (ref 1.005–1.030)
pH: 6 (ref 5.0–8.0)

## 2014-07-30 MED ORDER — CIPROFLOXACIN HCL 500 MG PO TABS: 500.0000 mg | ORAL_TABLET | Freq: Two times a day (BID) | ORAL | Status: AC

## 2014-07-30 NOTE — ED Provider Notes (Signed)
Patient presents today with symptoms of urinary urgency at times retention he says is due to BPH and UTI. Patient has a foul odor from the urine when he does urinate. Symptoms have been going on for about a week. Patient has had problems this with this in the past and has been diagnosed with a UTI. He also for the past week has been feeling dizzy and fatigued at times usually when rising from a seated or lying position. Patient denies any chest pain, shortness of breath, diaphoresis, abdominal pain, nausea, vomiting, diarrhea, severe headache, weakness of one side.  Review systems negative except mentioned above. Vitals as per Epic. Heart rate was checked by me given some discrepancy with nurse. It was regular rate and rhythm.  ROS: Negative except mentioned above.  GENERAL: NAD HEENT: no pharyngeal erythema, no exudate RESP: CTA B CARD: RRR ABD: morbidly obese, +BS, NT, no CVAT EXTREM: dressing on right leg was not removed, patient states that the wound is healing and is being followed by wound clinic weekly  NEURO: CN II-XII grossly intact   A/P: UTI/BPH- Will treat patient with Cipro, rest, hydration, will send urine for culture, recommend patient follow-up with primary care physician on Tuesday or Wednesday of this week, if symptoms do worsen I have asked that he go to the ER immediately. Patient and wife address understanding of plan.   Jolene ProvostKirtida Kloey Cazarez, MD 07/30/14 [phone]

## 2014-07-30 NOTE — ED Notes (Signed)
Having trouble going to bathroom to urinate for 1 week. Dizzy spells 1 week especially when arising from a sitting position.

## 2014-08-01 ENCOUNTER — Encounter: Payer: Medicare Other | Attending: Surgery | Admitting: Surgery

## 2014-08-01 DIAGNOSIS — X58XXXA Exposure to other specified factors, initial encounter: Secondary | ICD-10-CM | POA: Diagnosis not present

## 2014-08-01 DIAGNOSIS — I87333 Chronic venous hypertension (idiopathic) with ulcer and inflammation of bilateral lower extremity: Secondary | ICD-10-CM | POA: Insufficient documentation

## 2014-08-01 DIAGNOSIS — S81811A Laceration without foreign body, right lower leg, initial encounter: Secondary | ICD-10-CM | POA: Insufficient documentation

## 2014-08-01 DIAGNOSIS — L97312 Non-pressure chronic ulcer of right ankle with fat layer exposed: Secondary | ICD-10-CM | POA: Diagnosis not present

## 2014-08-01 LAB — URINE CULTURE

## 2014-08-02 NOTE — Progress Notes (Signed)
[name], Red R. ([id number]) Visit Report for 08/01/2014 Arrival Information Details Patient Name: [name], [name] Date of Service: 08/01/2014 11:15 AM Medical Record Number: [id number] Patient Account Number: [id number] Date of Birth/Sex: 11/21/1945 [age] y.o. Male) Treating RN: Huel CoventryWoody, Kim Primary Care Physician: HEARD, Chrissie NoaWILLIAM Other Clinician: Referring Physician: HEARD, Chrissie NoaWILLIAM Treating Physician/Extender: Rudene ReBritto, Errol Weeks in Treatment: 4 Visit Information History Since Last Visit Added or deleted any medications: No Patient Arrived: Gilmer MorCane Any new allergies or adverse reactions: No Arrival Time: 11:33 Had a fall or experienced change in No Accompanied By: wife activities of daily living that may affect Transfer Assistance: Manual risk of falls: Patient Identification Verified: Yes Signs or symptoms of abuse/neglect since last No Secondary Verification Process Yes visito Completed: Hospitalized since last visit: No Patient Has Alerts: Yes Has Dressing in Place as Prescribed: Yes Patient Alerts: 6/7/16ABI L 1.21 R Has Compression in Place as Prescribed: Yes 1.27 Pain Present Now: No Electronic Signature(s) Signed: 08/01/2014 5:42:28 PM By: Elliot GurneyWoody, RN, BSN, Kim RN, BSN Entered By: Elliot GurneyWoody, RN, BSN, Kim on 08/01/2014 11:37:30 [place], [name] ([id number]) -------------------------------------------------------------------------------- Encounter Discharge Information Details Patient Name: [name], Jailyn R. Date of Service: 08/01/2014 11:15 AM Medical Record Number: [id number] Patient Account Number: [id number] Date of Birth/Sex: 08/28/1945 [age] y.o. Male) Treating RN: Huel CoventryWoody, Kim Primary Care Physician: HEARD, Chrissie NoaWILLIAM Other Clinician: Referring Physician: HEARD, Chrissie NoaWILLIAM Treating Physician/Extender: Rudene ReBritto, Errol Weeks in Treatment: 4 Encounter Discharge Information Items Discharge Pain Level: 0 Discharge Condition: Stable Ambulatory Status: Cane Discharge Destination:  Home Private Transportation: Auto Accompanied By: wife Schedule Follow-up Appointment: Yes Medication Reconciliation completed and Yes provided to Patient/Care Xavious Sharrar: Clinical Summary of Care: Electronic Signature(s) Signed: 08/01/2014 5:42:28 PM By: Elliot GurneyWoody, RN, BSN, Kim RN, BSN Entered By: Elliot GurneyWoody, RN, BSN, Kim on 08/01/2014 12:21:31 [name], [name] ([id number]) -------------------------------------------------------------------------------- Lower Extremity Assessment Details Patient Name: [name], [name] Date of Service: 08/01/2014 11:15 AM Medical Record Number: [id number] Patient Account Number: [id number] Date of Birth/Sex: 02/09/1945 [age] y.o. Male) Treating RN: Huel CoventryWoody, Kim Primary Care Physician: HEARD, Chrissie NoaWILLIAM Other Clinician: Referring Physician: HEARD, Chrissie NoaWILLIAM Treating Physician/Extender: Rudene ReBritto, Errol Weeks in Treatment: 4 Edema Assessment Assessed: [Left: No] [Right: No] E[Left: dema] [Right: :] Calf Left: Right: Point of Measurement: 36 cm From Medial Instep cm 42.6 cm Ankle Left: Right: Point of Measurement: 11 cm From Medial Instep cm 27.5 cm Vascular Assessment Pulses: Posterior Tibial Dorsalis Pedis Palpable: [Right:Yes] Extremity colors, hair growth, and conditions: Extremity Color: [Right:Hyperpigmented] Hair Growth on Extremity: [Right:No] Temperature of Extremity: [Right:Warm] Capillary Refill: [Right:< 3 seconds] Toe Nail Assessment Left: Right: Thick: No Discolored: No Deformed: No Electronic Signature(s) Signed: 08/01/2014 5:42:28 PM By: Elliot GurneyWoody, RN, BSN, Kim RN, BSN Entered By: Elliot GurneyWoody, RN, BSN, Kim on 08/01/2014 11:46:49 Fussell, [name] ([id number]) -------------------------------------------------------------------------------- Multi Wound Chart Details Patient Name: [name], [name] Date of Service: 08/01/2014 11:15 AM Medical Record Number: [id number] Patient Account Number: [id number] Date of Birth/Sex: 12/02/1945 [age] y.o. Male) Treating RN:  Huel CoventryWoody, Kim Primary Care Physician: HEARD, Chrissie NoaWILLIAM Other Clinician: Referring Physician: HEARD, Chrissie NoaWILLIAM Treating Physician/Extender: Rudene ReBritto, Errol Weeks in Treatment: 4 Vital Signs Height(in): 68 Pulse(bpm): 68 Weight(lbs): 385 Blood Pressure 147/88 (mmHg): Body Mass Index(BMI): 59 Temperature(F): Respiratory Rate 18 (breaths/min): Photos: [4:No Photos] [N/A:N/A] Wound Location: [4:Right Malleolus - Lateral] [N/A:N/A] Wounding Event: [4:Trauma] [N/A:N/A] Primary Etiology: [4:Trauma, Other] [N/A:N/A] Comorbid History: [4:Anemia, Sleep Apnea, Hypertension] [N/A:N/A] Date Acquired: [4:06/25/2014] [N/A:N/A] Weeks of Treatment: [4:4] [N/A:N/A] Wound Status: [4:Open] [N/A:N/A] Measurements L x W x D 2.5x1x0.1 [N/A:N/A] (cm) Area (cm) : [4:1.963] [N/A:N/A]  Volume (cm) : [4:0.196] [N/A:N/A] % Reduction in Area: [4:84.70%] [N/A:N/A] % Reduction in Volume: 92.40% [N/A:N/A] Classification: [4:Full Thickness Without Exposed Support Structures] [N/A:N/A] Exudate Amount: [4:Small] [N/A:N/A] Exudate Type: [4:Serous] [N/A:N/A] Exudate Color: [4:amber] [N/A:N/A] Wound Margin: [4:Flat and Intact] [N/A:N/A] Granulation Amount: [4:Small (1-33%)] [N/A:N/A] Granulation Quality: [4:Pink] [N/A:N/A] Necrotic Amount: [4:Large (67-100%)] [N/A:N/A] Exposed Structures: [4:Fascia: No Fat: No Tendon: No Muscle: No Joint: No] [N/A:N/A] Bone: No Limited to Skin Breakdown Epithelialization: Medium (34-66%) N/A N/A Debridement: Debridement (13086- N/A N/A 11047) Periwound Skin Texture: Edema: Yes N/A N/A Excoriation: No Induration: No Callus: No Crepitus: No Fluctuance: No Friable: No Rash: No Scarring: No Periwound Skin Moist: Yes N/A N/A Moisture: Maceration: No Dry/Scaly: No Periwound Skin Color: Atrophie Blanche: No N/A N/A Cyanosis: No Ecchymosis: No Erythema: No Hemosiderin Staining: No Mottled: No Pallor: No Rubor: No Temperature: No Abnormality N/A N/A Tenderness on No  N/A N/A Palpation: Wound Preparation: Ulcer Cleansing: Other: N/A N/A soap and water Topical Anesthetic Applied: Other: lidocaine 4% Procedures Performed: Debridement N/A N/A Treatment Notes Electronic Signature(s) Signed: 08/01/2014 5:42:28 PM By: Elliot Gurney, RN, BSN, Kim RN, BSN Entered By: Elliot Gurney, RN, BSN, Kim on 08/01/2014 12:00:15 [name] Alma ([id number]) -------------------------------------------------------------------------------- Multi-Disciplinary Care Plan Details Patient Name: DARSHAN, SOLANKI. Date of Service: 08/01/2014 11:15 AM Medical Record Number: [id number] Patient Account Number: [id number] Date of Birth/Sex: 04-19-1945 (69 y.o. Male) Treating RN: Huel Coventry Primary Care Physician: HEARD, Chrissie Noa Other Clinician: Referring Physician: HEARD, Chrissie Noa Treating Physician/Extender: Rudene Re in Treatment: 4 Active Inactive Abuse / Safety / Falls / Self Care Management Nursing Diagnoses: Potential for falls Goals: Patient will remain injury free Date Initiated: 07/04/2014 Goal Status: Active Patient/caregiver will verbalize understanding of skin care regimen Date Initiated: 07/04/2014 Goal Status: Active Patient/caregiver will verbalize/demonstrate measures taken to prevent injury and/or falls Date Initiated: 07/04/2014 Goal Status: Active Patient/caregiver will verbalize/demonstrate understanding of what to do in case of emergency Date Initiated: 07/04/2014 Goal Status: Active Interventions: Assess fall risk on admission and as needed Assess: immobility, friction, shearing, incontinence upon admission and as needed Assess impairment of mobility on admission and as needed per policy Assess self care needs on admission and as needed Provide education on basic hygiene Provide education on fall prevention Provide education on safe transfers Treatment Activities: Education provided on Basic Hygiene : 07/04/2014 Notes: Orientation to the Wound Care  Program Nursing Diagnoses: BRODY, BONNEAU ([id number]) Knowledge deficit related to the wound healing center program Goals: Patient/caregiver will verbalize understanding of the Wound Healing Center Program Date Initiated: 07/04/2014 Goal Status: Active Interventions: Provide education on orientation to the wound center Notes: Venous Leg Ulcer Nursing Diagnoses: Actual venous Insuffiency (use after diagnosis is confirmed) Knowledge deficit related to disease process and management Goals: Non-invasive venous studies are completed as ordered Date Initiated: 07/04/2014 Goal Status: Active Patient will maintain optimal edema control Date Initiated: 07/04/2014 Goal Status: Active Patient/caregiver will verbalize understanding of disease process and disease management Date Initiated: 07/04/2014 Goal Status: Active Verify adequate tissue perfusion prior to therapeutic compression application Date Initiated: 07/04/2014 Goal Status: Active Interventions: Assess peripheral edema status every visit. Compression as ordered Provide education on venous insufficiency Treatment Activities: Non-invasive vascular studies : 08/01/2014 Test ordered outside of clinic : 08/01/2014 Therapeutic compression applied : 08/01/2014 Notes: Wound/Skin Impairment Nursing Diagnoses: REYMUNDO, WINSHIP ([id number]) Impaired tissue integrity Knowledge deficit related to ulceration/compromised skin integrity Goals: Patient/caregiver will verbalize understanding of skin care regimen Date Initiated: 07/04/2014 Goal Status: Active Ulcer/skin breakdown will heal within 14  weeks Date Initiated: 07/04/2014 Goal Status: Active Interventions: Assess patient/caregiver ability to obtain necessary supplies Assess patient/caregiver ability to perform ulcer/skin care regimen upon admission and as needed Provide education on ulcer and skin care Treatment Activities: Skin care regimen initiated : 08/01/2014 Topical wound management  initiated : 08/01/2014 Notes: Electronic Signature(s) Signed: 08/01/2014 5:42:28 PM By: Elliot Gurney, RN, BSN, Kim RN, BSN Entered By: Elliot Gurney, RN, BSN, Kim on 08/01/2014 12:00:01 Duff, [name] ([id number]) -------------------------------------------------------------------------------- Pain Assessment Details Patient Name: [name] Pen R. Date of Service: 08/01/2014 11:15 AM Medical Record Number: [id number] Patient Account Number: [id number] Date of Birth/Sex: 1945/12/01 (69 y.o. Male) Treating RN: Huel Coventry Primary Care Physician: HEARD, Chrissie Noa Other Clinician: Referring Physician: HEARD, Chrissie Noa Treating Physician/Extender: Rudene Re in Treatment: 4 Active Problems Location of Pain Severity and Description of Pain Patient Has Paino No Site Locations Pain Management and Medication Current Pain Management: Electronic Signature(s) Signed: 08/01/2014 5:42:28 PM By: Elliot Gurney, RN, BSN, Kim RN, BSN Entered By: Elliot Gurney, RN, BSN, Kim on 08/01/2014 11:37:36 [place], [name] ([id number]) -------------------------------------------------------------------------------- Patient/Caregiver Education Details Patient Name: [name] Alma. Date of Service: 08/01/2014 11:15 AM Medical Record Number: [id number] Patient Account Number: [id number] Date of Birth/Gender: [birth year] (69 y.o. Male) Treating RN: Huel Coventry Primary Care Physician: HEARD, Chrissie Noa Other Clinician: Referring Physician: HEARD, Chrissie Noa Treating Physician/Extender: Rudene Re in Treatment: 4 Education Assessment Education Provided To: Patient Education Topics Provided Wound/Skin Impairment: Handouts: Caring for Your Ulcer, Other: continue unna boot as prescribed Electronic Signature(s) Signed: 08/01/2014 5:42:28 PM By: Elliot Gurney, RN, BSN, Kim RN, BSN Entered By: Elliot Gurney, RN, BSN, Kim on 08/01/2014 12:21:57 [name], [name] ([id number]) -------------------------------------------------------------------------------- Wound  Assessment Details Patient Name: [name] Alma. Date of Service: 08/01/2014 11:15 AM Medical Record Number: [id number] Patient Account Number: [id number] Date of Birth/Sex: 1945/04/29 (69 y.o. Male) Treating RN: Huel Coventry Primary Care Physician: HEARD, Chrissie Noa Other Clinician: Referring Physician: HEARD, Chrissie Noa Treating Physician/Extender: Rudene Re in Treatment: 4 Wound Status Wound Number: 4 Primary Etiology: Trauma, Other Wound Location: Right Malleolus - Lateral Wound Status: Open Wounding Event: Trauma Comorbid Anemia, Sleep Apnea, History: Hypertension Date Acquired: 06/25/2014 Weeks Of Treatment: 4 Clustered Wound: No Photos Wound Measurements Length: (cm) 2 Width: (cm) 1 Depth: (cm) 0.1 Area: (cm) 1.571 Volume: (cm) 0.157 % Reduction in Area: 87.7% % Reduction in Volume: 93.9% Epithelialization: Medium (34-66%) Wound Description Full Thickness Without Exposed Classification: Support Structures Wound Margin: Flat and Intact Exudate Small Amount: Exudate Type: Serous Exudate Color: amber Foul Odor After Cleansing: No Wound Bed Granulation Amount: Small (1-33%) Exposed Structure Granulation Quality: Pink Fascia Exposed: No Necrotic Amount: Large (67-100%) Fat Layer Exposed: No Malinoski, [name] ([id number]) Necrotic Quality: Adherent Slough Tendon Exposed: No Muscle Exposed: No Joint Exposed: No Bone Exposed: No Limited to Skin Breakdown Periwound Skin Texture Texture Color No Abnormalities Noted: No No Abnormalities Noted: No Callus: No Atrophie Blanche: No Crepitus: No Cyanosis: No Excoriation: No Ecchymosis: No Fluctuance: No Erythema: No Friable: No Hemosiderin Staining: No Induration: No Mottled: No Localized Edema: No Pallor: No Rash: No Rubor: No Scarring: No Temperature / Pain Moisture Temperature: No Abnormality No Abnormalities Noted: No Dry / Scaly: Yes Maceration: No Moist: Yes Wound Preparation Ulcer  Cleansing: Other: soap and water, Topical Anesthetic Applied: Other: lidocaine 4%, Treatment Notes Wound #4 (Right, Lateral Malleolus) 1. Cleansed with: Clean wound with Normal Saline 2. Anesthetic Topical Lidocaine 4% cream to wound bed prior to debridement 4. Dressing Applied: Mepitel 7. Secured with Henriette Combs to Right Lower Extremity  Electronic Signature(s) Signed: 08/01/2014 5:42:28 PM By: Elliot Gurney, RN, BSN, Kim RN, BSN Entered By: Elliot Gurney, RN, BSN, Kim on 08/01/2014 12:27:42 [name] Alma ([id number]) -------------------------------------------------------------------------------- Vitals Details Patient Name: [name] Alma. Date of Service: 08/01/2014 11:15 AM Medical Record Number: [id number] Patient Account Number: [id number] Date of Birth/Sex: 26-May-1945 (68 y.o. Male) Treating RN: Huel Coventry Primary Care Physician: HEARD, Chrissie Noa Other Clinician: Referring Physician: HEARD, Chrissie Noa Treating Physician/Extender: Rudene Re in Treatment: 4 Vital Signs Time Taken: 11:37 Pulse (bpm): 68 Height (in): 68 Respiratory Rate (breaths/min): 18 Weight (lbs): 385 Blood Pressure (mmHg): 147/88 Body Mass Index (BMI): 58.5 Reference Range: 80 - 120 mg / dl Electronic Signature(s) Signed: 08/01/2014 5:42:28 PM By: Elliot Gurney, RN, BSN, Kim RN, BSN Entered By: Elliot Gurney, RN, BSN, Kim on 08/01/2014 11:38:03

## 2014-08-02 NOTE — Progress Notes (Addendum)
CANNON, ARREOLA ([id number]) Visit Report for 08/01/2014 Chief Complaint Document Details Patient Name: [name], [name]. Date of Service: 08/01/2014 11:15 AM Medical Record Number: [id number] Patient Account Number: [id number] Date of Birth/Sex: [birth year] (69 y.o. Male) Treating RN: Primary Care Physician: HEARDChrissie Noa Other Clinician: Referring Physician: HEARD, Chrissie Noa Treating Physician/Extender: Rudene Re in Treatment: 4 Information Obtained from: Patient Chief Complaint Patient presents to the wound care center for a consult due non healing wound. He recently had a injury to his right lower extremity on the lateral part while working in the yard about 2 weeks ago. This progressed to an ulceration and he went to the ED about 4 days ago. Electronic Signature(s) Signed: 08/01/2014 12:24:54 PM By: Evlyn Kanner MD, FACS Entered By: Evlyn Kanner on 08/01/2014 12:12:08 [name] ([id number]) -------------------------------------------------------------------------------- Debridement Details Patient Name: [name]. Date of Service: 08/01/2014 11:15 AM Medical Record Number: [id number] Patient Account Number: [id number] Date of Birth/Sex: December 19, 1945 (69 y.o. Male) Treating RN: Primary Care Physician: HEARDChrissie Noa Other Clinician: Referring Physician: HEARD, Chrissie Noa Treating Physician/Extender: Rudene Re in Treatment: 4 Debridement Performed for Wound #4 Right,Lateral Malleolus Assessment: Performed By: Physician Tristan Schroeder., MD Debridement: Open Wound/Selective Debridement Selective Description: Pre-procedure Yes Verification/Time Out Taken: Start Time: 11:54 Pain Control: Lidocaine 5% topical ointment Level: Non-Viable Tissue Total Area Debrided (L x 2 (cm) x 1 (cm) = 2 (cm) W): Tissue and other Non-Viable, Eschar material debrided: Instrument: Forceps Bleeding: None End Time: 11:59 Procedural Pain: 0 Post Procedural Pain:  0 Response to Treatment: Procedure was tolerated well Post Debridement Measurements of Total Wound Length: (cm) 2 Width: (cm) 1 Depth: (cm) 0.1 Volume: (cm) 0.157 Electronic Signature(s) Signed: 08/01/2014 12:24:54 PM By: Evlyn Kanner MD, FACS Entered By: Evlyn Kanner on 08/01/2014 12:16:31 Stankus, Jorje Guild ([id number]) -------------------------------------------------------------------------------- HPI Details Patient Name: [name]. Date of Service: 08/01/2014 11:15 AM Medical Record Number: [id number] Patient Account Number: [id number] Date of Birth/Sex: 1945/09/20 (69 y.o. Male) Treating RN: Primary Care Physician: HEARDChrissie Noa Other Clinician: Referring Physician: HEARD, Chrissie Noa Treating Physician/Extender: Rudene Re in Treatment: 4 History of Present Illness Location: right lower extremity Quality: Patient reports experiencing a dull pain to affected area(s). Severity: Patient states wound are getting worse. Duration: Patient has had the wound for < 2 weeks prior to presenting for treatment Timing: injury occurred while he was working in the yard where he had a laceration on the right lateral aspect of his ankle. Context: The wound occurred when the patient was using a weed Johnell Comings. Modifying Factors: Consults to this date include:going to the ED where he was put on Levaquin and Bactrim and Bactroban. Associated Signs and Symptoms: Patient reports having difficulty standing for long periods. HPI Description: As noted above the patient had a injury due to working in the yard and this rapidly progressed to a ulcerated area with an infection. In October 2014 he did have some vascular studies done but never saw a Careers adviser and never had any surgery for varicose veins. His past medical history is significant for hypertension, obstructive sleep apnea, morbid obesity, stasis dermatitis of both lower extremities and his past surgical history is suggestive of hip  surgery on the right, gastric bypass surgery, cholecystectomy, hip replacement. He has recently been put on Levaquin 500 milligrams daily, Bactrim DS 1 twice a day and Bactroban 2% topical ointment locally. No recent labs or vascular or imaging studies done. 07/11/2014 a venous duplex study was done on 07/05/2014 -- it showed no  evidence of deep vein thrombosis or superficial thrombosis bilaterally. 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 The patient has an appointment to see the vascular surgeons on June 20. 07/18/2014 his appointment with the vascular surgeons is rescheduled for this afternoon. 07/25/2014 -- he saw the vascular surgeon and is going to have a endovenous procedure done on July 29. 08/01/2014 -- he was diagnosed with a UTI and is on ciprofloxacin for 10 days. Electronic Signature(s) Signed: 08/01/2014 12:24:54 PM By: Evlyn Kanner MD, FACS Entered By: Evlyn Kanner on 08/01/2014 12:12:32 [name] ([id number]) -------------------------------------------------------------------------------- Physical Exam Details Patient Name: [name]. Date of Service: 08/01/2014 11:15 AM Medical Record Number: [id number] Patient Account Number: [id number] Date of Birth/Sex: [birth year] (69 y.o. Male) Treating RN: Primary Care Physician: Tawni Levy Other Clinician: Referring Physician: HEARD, Chrissie Noa Treating Physician/Extender: Rudene Re in Treatment: 4 Constitutional . Pulse regular. Respirations normal and unlabored. Afebrile. . Eyes Nonicteric. Reactive to light. Ears, Nose, Mouth, and Throat Lips, teeth, and gums WNL.Marland Kitchen Moist mucosa without lesions . Neck supple and nontender. No palpable supraclavicular or cervical adenopathy. Normal sized without goiter. Respiratory WNL. No retractions.. Cardiovascular Pedal Pulses WNL. No clubbing, cyanosis or edema. Musculoskeletal Adexa  without tenderness or enlargement.. Digits and nails w/o clubbing, cyanosis, infection, petechiae, ischemia, or inflammatory conditions.. Integumentary (Hair, Skin) No suspicious lesions. No crepitus or fluctuance. No peri-wound warmth or erythema. No masses.Marland Kitchen Psychiatric Judgement and insight Intact.. No evidence of depression, anxiety, or agitation.. Notes once all the eschar was removed the ulcerated area has healed very nicely and there is minimal open ulceration. Electronic Signature(s) Signed: 08/01/2014 12:24:54 PM By: Evlyn Kanner MD, FACS Entered By: Evlyn Kanner on 08/01/2014 12:13:19 [name] ([id number]) -------------------------------------------------------------------------------- Physician Orders Details Patient Name: [name]. Date of Service: 08/01/2014 11:15 AM Medical Record Number: [id number] Patient Account Number: [id number] Date of Birth/Sex: 1945-02-27 (69 y.o. Male) Treating RN: Huel Coventry Primary Care Physician: HEARD, Chrissie Noa Other Clinician: Referring Physician: HEARD, Chrissie Noa Treating Physician/Extender: Rudene Re in Treatment: 4 Verbal / Phone Orders: Yes Clinician: Huel Coventry Read Back and Verified: Yes Diagnosis Coding Wound Cleansing Wound #4 Right,Lateral Malleolus o May Shower, gently pat wound dry prior to applying new dressing. o May shower with protection. o No tub bath. Anesthetic Wound #4 Right,Lateral Malleolus o Topical Lidocaine 4% cream applied to wound bed prior to debridement Primary Wound Dressing Wound #4 Right,Lateral Malleolus o Prisma Ag Secondary Dressing Wound #4 Right,Lateral Malleolus o Gauze and Kerlix/Conform Dressing Change Frequency Wound #4 Right,Lateral Malleolus o Change dressing every week Follow-up Appointments Wound #4 Right,Lateral Malleolus o Return Appointment in 1 week. Edema Control Wound #4 Right,Lateral Malleolus o Unna Boot to Right Lower Extremity o  Elevate legs to the level of the heart and pump ankles as often as possible Electronic Signature(s) Signed: 08/01/2014 12:24:54 PM By: Evlyn Kanner MD, FACS Signed: 08/01/2014 5:42:28 PM By: Elliot Gurney RN, BSN, Kim RN, BSN Oatis, Ziah RMarland Kitchen ([id number]) Entered By: Elliot Gurney, RN, BSN, Kim on 08/01/2014 12:03:55 Batch, Jorje Guild ([id number]) -------------------------------------------------------------------------------- Problem List Details Patient Name: ELEAZAR, KIMMEY. Date of Service: 08/01/2014 11:15 AM Medical Record Number: [id number] Patient Account Number: [id number] Date of Birth/Sex: 15-Nov-1945 (69 y.o. 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 Date of Service: 08/01/2014 11:15 AM Medical Record Number: [id number] Patient Account Number: [id number] Date of Birth/Sex: [birth year] (69 y.o. Male) Treating RN: Primary Care Physician: HEARDChrissie Noa Other Clinician: Referring Physician: HEARD, Chrissie Noa Treating Physician/Extender: Rudene Re in Treatment: 4 Subjective Chief Complaint Information  obtained from Patient Patient presents to the wound care center for a consult due non healing wound. He recently had a injury to his right lower extremity on the lateral part while working in the yard about 2 weeks ago. This progressed to an ulceration and he went to the ED about 4 days ago. History of Present Illness (HPI) The following HPI elements were documented for the patient's wound: Location: right lower extremity Quality: Patient reports experiencing a dull pain to affected area(s). Severity: Patient states wound are getting worse. Duration: Patient has had the wound for < 2 weeks prior to presenting for treatment Timing: injury occurred while he was working in the yard where he had a laceration on the right lateral aspect of his ankle. Context: The wound occurred when the patient was using a weed Johnell Comings. Modifying Factors: Consults to this date include:going to the ED where he was put on Levaquin and Bactrim and Bactroban. Associated Signs and Symptoms: Patient reports having difficulty standing for long periods. As noted above the patient had a injury due to working in the yard and this rapidly progressed to a ulcerated area with an infection. In October 2014 he did have some vascular studies done but never saw a Careers adviser and never had any surgery for varicose veins. His past medical history is significant for hypertension, obstructive sleep apnea, morbid obesity, stasis dermatitis of both lower extremities and his past surgical history is suggestive of hip surgery on the right, gastric bypass surgery, cholecystectomy, hip replacement. He has recently been put on Levaquin 500 milligrams daily, Bactrim DS 1 twice a day and Bactroban 2% topical ointment locally. No recent labs or vascular or imaging studies done. 07/11/2014 a venous duplex study was done on 07/05/2014 -- it showed no evidence of deep vein thrombosis or superficial thrombosis bilaterally. There was incompetence of  the greater saphenous veins bilaterally and incompetence of the right small saphenous vein. The patient has an appointment to see the vascular surgeons on June 20. 07/18/2014 his appointment with the vascular surgeons is rescheduled for this afternoon. 07/25/2014 -- he saw the vascular surgeon and is going to have a endovenous procedure done on July 29. 08/01/2014 -- he was diagnosed with a UTI and is on ciprofloxacin for 10 days. Mcmurtrey, Chisom R. ([id number]) Objective Constitutional Pulse regular. Respirations normal and unlabored. Afebrile. Vitals Time Taken: 11:37 AM, Height: 68 in, Weight: 385 lbs, BMI: 58.5, Pulse: 68 bpm, Respiratory Rate: 18 breaths/min, Blood Pressure: 147/88 mmHg. Eyes Nonicteric. Reactive to light. Ears, Nose, Mouth, and Throat Lips, teeth, and gums WNL.Marland Kitchen Moist mucosa without lesions . Neck supple and nontender. No palpable supraclavicular or cervical adenopathy. Normal sized without goiter. Respiratory WNL. No retractions.. Cardiovascular Pedal Pulses WNL. No clubbing, cyanosis or edema. Musculoskeletal Adexa without tenderness or  enlargement.. Digits and nails w/o clubbing, cyanosis, infection, petechiae, ischemia, or inflammatory conditions.Marland Kitchen Psychiatric Judgement and insight Intact.. No evidence of depression, anxiety, or agitation.. General Notes: once all the eschar was removed the ulcerated area has healed very nicely and there is minimal open ulceration. Integumentary (Hair, Skin) No suspicious lesions. No crepitus or fluctuance. No peri-wound warmth or erythema. No masses.. Wound #4 status is Open. Original cause of wound was Trauma. The wound is located on the Right,Lateral Malleolus. The wound measures 2cm length x 1cm width x 0.1cm depth; 1.571cm^2 area and 0.157cm^3 volume. The wound is limited to skin breakdown. There is a small amount of serous drainage noted. The wound margin is flat and intact. There is small (1-33%) pink granulation  within the wound bed. There is a large (67-100%) amount of necrotic tissue within the wound bed including Adherent Slough. The periwound skin appearance exhibited: Dry/Scaly, Moist. The periwound skin appearance did not exhibit: Callus, Speirs, Bertel R. ([id number]) Crepitus, Excoriation, Fluctuance, Friable, Induration, Localized Edema, Rash, Scarring, Maceration, Atrophie Blanche, Cyanosis, Ecchymosis, Hemosiderin Staining, Mottled, Pallor, Rubor, Erythema. Periwound temperature was noted as No Abnormality. 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 He is going to be out for about 8 days and alternate wound care instructions have been given in case he runs into any problems with his Unna's boot. Procedures Wound #4 Wound #4 is a Trauma, Other located on the Right,Lateral Malleolus . There was a Non-Viable Tissue Open Wound/Selective [phone]) debridement with total area of 2 sq cm performed by Larnie Heart, Ignacia Felling., MD. with the following instrument(s): Forceps to remove Non-Viable tissue/material including Eschar after achieving pain control using Lidocaine 5% topical ointment. A time out was conducted prior to the start of the procedure. There was no bleeding. The procedure was tolerated well with a pain level of 0 throughout and a pain level of 0 following the procedure. Post Debridement Measurements: 2cm length x 1cm width x 0.1cm depth; 0.157cm^3 volume. Plan Wound Cleansing: Wound #4 Right,Lateral Malleolus: May Shower, gently pat wound dry prior to  applying new dressing. May shower with protection. No tub bath. LAURIS, SERVISS ([id number]) Anesthetic: Wound #4 Right,Lateral Malleolus: Topical Lidocaine 4% cream applied to wound bed prior to debridement Primary Wound Dressing: Wound #4 Right,Lateral Malleolus: Prisma Ag Secondary Dressing: Wound #4 Right,Lateral Malleolus: Gauze and Kerlix/Conform Dressing Change Frequency: Wound #4 Right,Lateral Malleolus: Change dressing every week Follow-up Appointments: Wound #4 Right,Lateral Malleolus: Return Appointment in 1 week. Edema Control: Wound #4 Right,Lateral Malleolus: Unna Boot to Right Lower Extremity Elevate legs to the level of the heart and pump ankles as often as possible The wound is looking very good and I will use a piece of Mepitel and some compression bandage with the Unna's boot as before. He is going to be out for about 8 days and alternate wound care instructions have been given in case he runs into any problems with his Unna's boot. Electronic Signature(s) Signed: 08/07/2014 12:37:17 PM By: Evlyn Kanner MD, FACS Previous Signature: 08/01/2014 12:24:54 PM Version By: Evlyn Kanner MD, FACS Entered By: Evlyn Kanner on 08/07/2014 12:37:17 Hsiao,  Callen Marland Kitchen. ([id number]) -------------------------------------------------------------------------------- SuperBill Details Patient Name: [name] AlmaSHAFAR, Morgon R. Date of Service: 08/01/2014 Medical Record Number: [id number] Patient Account Number: [id number] Date of Birth/Sex: 01/15/1946 [age] y.o. Male) Treating RN: Primary Care Physician: HEARChrissie Noa, WILLIAM Other Clinician: Referring Physician: HEARD, Chrissie NoaWILLIAM Treating Physician/Extender: Rudene ReBritto, Katie Moch Weeks in Treatment: 4 Diagnosis Coding ICD-10 Codes Code Description [phone] Laceration without foreign body, right lower leg, initial encounter Chronic venous hypertension (idiopathic) with ulcer and inflammation of bilateral lower I87.333 extremity L97.312 Non-pressure chronic  ulcer of right ankle with fat layer exposed E66.01 Morbid (severe) obesity due to excess calories Facility Procedures CPT4: Description Modifier Quantity Code [id number] - DEBRIDE WOUND 1ST 20 SQ CM OR < 1 ICD-10 Description Diagnosis S81.811A Laceration without foreign body, right lower leg, initial encounter I87.333 Chronic venous hypertension (idiopathic) with  ulcer and inflammation of bilateral lower extremity Physician Procedures CPT4: Description Modifier Quantity Code [id number] - WC PHYS DEBR WO ANESTH 20 SQ CM 1 ICD-10 Description Diagnosis S81.811A Laceration without foreign body, right lower leg, initial encounter I87.333 Chronic venous hypertension (idiopathic) with  ulcer and inflammation of bilateral lower extremity Electronic Signature(s) Signed: 08/01/2014 12:24:54 PM By: Evlyn KannerBritto, Tesla Bochicchio MD, FACS Entered By: Evlyn KannerBritto, Darriona Dehaas on 08/01/2014 12:16:47

## 2014-08-08 ENCOUNTER — Encounter: Payer: Medicare Other | Admitting: Surgery

## 2014-08-08 DIAGNOSIS — S81811A Laceration without foreign body, right lower leg, initial encounter: Secondary | ICD-10-CM | POA: Diagnosis not present

## 2014-08-09 NOTE — Progress Notes (Signed)
ALFONZO, ARCA ([id number]) Visit Report for 08/08/2014 Chief Complaint Document Details Patient Name: [name], [name]. Date of Service: 08/08/2014 11:00 AM Medical Record Number: [id number] Patient Account Number: [id number] Date of Birth/Sex: 1945-11-03 (69 y.o. Male) Treating RN: Primary Care Physician: HEARDChrissie Noa Other Clinician: Referring Physician: HEARD, Chrissie Noa Treating Physician/Extender: Rudene Re in Treatment: 5 Information Obtained from: Patient Chief Complaint Patient presents to the wound care center for a consult due non healing wound. He recently had a injury to his right lower extremity on the lateral part while working in the yard about 2 weeks ago. This progressed to an ulceration and he went to the ED about 4 days ago. Electronic Signature(s) Signed: 08/08/2014 11:34:11 AM By: Evlyn Kanner MD, FACS Entered By: Evlyn Kanner on 08/08/2014 11:34:11 [name] ([id number]) -------------------------------------------------------------------------------- HPI Details Patient Name: [name]. Date of Service: 08/08/2014 11:00 AM Medical Record Number: [id number] Patient Account Number: [id number] Date of Birth/Sex: 04-21-1945 (69 y.o. Male) Treating RN: Primary Care Physician: HEARDChrissie Noa Other Clinician: Referring Physician: HEARD, Chrissie Noa Treating Physician/Extender: Rudene Re in Treatment: 5 History of Present Illness Location: right lower extremity Quality: Patient reports experiencing a dull pain to affected area(s). Severity: Patient states wound are getting worse. Duration: Patient has had the wound for < 2 weeks prior to presenting for treatment Timing: injury occurred while he was working in the yard where he had a laceration on the right lateral aspect of his ankle. Context: The wound occurred when the patient was using a weed Johnell Comings. Modifying Factors: Consults to this date include:going to the ED where he was put on  Levaquin and Bactrim and Bactroban. Associated Signs and Symptoms: Patient reports having difficulty standing for long periods. HPI Description: As noted above the patient had a injury due to working in the yard and this rapidly progressed to a ulcerated area with an infection. In October 2014 he did have some vascular studies done but never saw a Careers adviser and never had any surgery for varicose veins. His past medical history is significant for hypertension, obstructive sleep apnea, morbid obesity, stasis dermatitis of both lower extremities and his past surgical history is suggestive of hip surgery on the right, gastric bypass surgery, cholecystectomy, hip replacement. He has recently been put on Levaquin 500 milligrams daily, Bactrim DS 1 twice a day and Bactroban 2% topical ointment locally. No recent labs or vascular or imaging studies done. 07/11/2014 a venous duplex study was done on 07/05/2014 -- it showed no evidence of deep vein thrombosis or superficial thrombosis bilaterally. 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 The patient has an appointment to see the vascular surgeons on June 20. 07/18/2014 his appointment with the vascular surgeons is rescheduled for this afternoon. 07/25/2014 -- he saw the vascular surgeon and is going to have a endovenous procedure done on July 29. 08/01/2014 -- he was diagnosed with a UTI and is on ciprofloxacin for 10 days. Electronic Signature(s) Signed: 08/08/2014 11:34:25 AM By: Evlyn Kanner MD, FACS Entered By: Evlyn Kanner on 08/08/2014 11:34:25 [name] ([id number]) -------------------------------------------------------------------------------- Physical Exam Details Patient Name: [name]. Date of Service: 08/08/2014 11:00 AM Medical Record Number: [id number] Patient Account Number: [id number] Date of Birth/Sex: 11-05-1945 (69 y.o. Male) Treating  RN: Primary Care Physician: Tawni Levy Other Clinician: Referring Physician: HEARD, Chrissie Noa Treating Physician/Extender: Rudene Re in Treatment: 5 Constitutional . Pulse regular. Respirations normal and unlabored. Afebrile. . Eyes Nonicteric.  Reactive to light. Ears, Nose, Mouth, and Throat Lips, teeth, and gums WNL.Marland Kitchen Moist mucosa without lesions . Neck supple and nontender. No palpable supraclavicular or cervical adenopathy. Normal sized without goiter. Respiratory WNL. No retractions.. Cardiovascular Pedal Pulses WNL. No clubbing, cyanosis or edema. Chest Breasts symmetical and no nipple discharge.. Breast tissue WNL, no masses, lumps, or tenderness.. Musculoskeletal Adexa without tenderness or enlargement.. Digits and nails w/o clubbing, cyanosis, infection, petechiae, ischemia, or inflammatory conditions.. Integumentary (Hair, Skin) No suspicious lesions. No crepitus or fluctuance. No peri-wound warmth or erythema. No masses.Marland Kitchen Psychiatric Judgement and insight Intact.. No evidence of depression, anxiety, or agitation.. Notes today there is no open ulceration and the wound is healed completely. Electronic Signature(s) Signed: 08/08/2014 11:34:58 AM By: Evlyn Kanner MD, FACS Entered By: Evlyn Kanner on 08/08/2014 11:34:58 [name] ([id number]) -------------------------------------------------------------------------------- Physician Orders Details Patient Name: [name]. Date of Service: 08/08/2014 11:00 AM Medical Record Number: [id number] Patient Account Number: [id number] Date of Birth/Sex: 09/11/1945 (69 y.o. Male) Treating RN: Curtis Sites Primary Care Physician: HEARD, Chrissie Noa Other Clinician: Referring Physician: HEARD, Chrissie Noa Treating Physician/Extender: Rudene Re in Treatment: 5 Verbal / Phone Orders: Yes Clinician: Curtis Sites Read Back and Verified: Yes Diagnosis Coding Discharge From [facility] o Discharge  from Wound Care Center Electronic Signature(s) Signed: 08/08/2014 12:24:37 PM By: Evlyn Kanner MD, FACS Signed: 08/08/2014 5:27:11 PM By: Curtis Sites Entered By: Curtis Sites on 08/08/2014 11:30:28 Hoadley, Jorje Guild ([id number]) -------------------------------------------------------------------------------- Problem List Details Patient Name: ABRAHAN, FULMORE R. Date of Service: 08/08/2014 11:00 AM Medical Record Number: [id number] Patient Account Number: [id number] Date of Birth/Sex: 1945/05/17 (69 y.o. 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 Date of Service: 08/08/2014 11:00 AM Medical Record Number: [id number] Patient Account Number: [id number] Date of Birth/Sex: 12-16-1945 (69 y.o. Male) Treating RN: Primary Care Physician: HEARDChrissie Noa Other Clinician: Referring Physician: HEARD, Chrissie Noa Treating Physician/Extender: Rudene Re in Treatment: 5 Subjective Chief Complaint Information obtained from Patient Patient presents to the wound care center for a consult due non healing  wound. He recently had a injury to his right lower extremity on the lateral part while working in the yard about 2 weeks ago. This progressed to an ulceration and he went to the ED about 4 days ago. History of Present Illness (HPI) The following HPI elements were documented for the patient's wound: Location: right lower extremity Quality: Patient reports experiencing a dull pain to affected area(s). Severity: Patient states wound are getting worse. Duration: Patient has had the wound for < 2 weeks prior to presenting for treatment Timing: injury occurred while he was working in the yard where he had a laceration on the right lateral aspect of his ankle. Context: The wound occurred when the patient was using a weed Johnell Comings. Modifying Factors: Consults to this date include:going to the ED where he was put on Levaquin and  Bactrim and Bactroban. Associated Signs and Symptoms: Patient reports having difficulty standing for long periods. As noted above the patient had a injury due to working in the yard and this rapidly progressed to a ulcerated area with an infection. In October 2014 he did have some vascular studies done but never saw a Careers advisersurgeon and never had any surgery for varicose veins. His past medical history is significant for hypertension, obstructive sleep apnea, morbid obesity, stasis dermatitis of both lower extremities and his past surgical history is suggestive of hip surgery on the right, gastric bypass surgery, cholecystectomy, hip replacement. He has recently been put on Levaquin 500 milligrams daily, Bactrim DS 1 twice a day and Bactroban 2% topical ointment locally. No recent labs or vascular or imaging studies done. 07/11/2014 a venous duplex study was done on 07/05/2014 -- it showed no evidence of deep vein thrombosis or superficial thrombosis bilaterally. 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 The  patient has an appointment to see the vascular surgeons on June 20. 07/18/2014 his appointment with the vascular surgeons is rescheduled for this afternoon. 07/25/2014 -- he saw the vascular surgeon and is going to have a endovenous procedure done on July 29. 08/01/2014 -- he was diagnosed with a UTI and is on ciprofloxacin for 10 days. Thissen, Hatim R. ([id number]) Objective Constitutional Pulse regular. Respirations normal and unlabored. Afebrile. Vitals Time Taken: 11:09 AM, Height: 68 in, Weight: 385 lbs, BMI: 58.5, Temperature: 98.3 F, Pulse: 86 bpm, Respiratory Rate: 18 breaths/min, Blood Pressure: 151/76 mmHg. Eyes Nonicteric. Reactive to light. Ears, Nose, Mouth, and Throat Lips, teeth, and gums WNL.Marland Kitchen. Moist mucosa without lesions . Neck supple and nontender. No palpable supraclavicular or cervical adenopathy. Normal sized without goiter. Respiratory WNL. No retractions.. Cardiovascular Pedal Pulses WNL. No clubbing, cyanosis or edema. Chest Breasts symmetical and no nipple discharge.. Breast tissue WNL, no masses, lumps, or tenderness.. Musculoskeletal Adexa without tenderness or enlargement.. Digits and nails w/o clubbing, cyanosis, infection, petechiae, ischemia, or inflammatory conditions.Marland Kitchen. Psychiatric Judgement and insight Intact.. No evidence of depression, anxiety, or agitation.. General Notes: today there is no open ulceration and the wound is healed completely. Integumentary (Hair, Skin) No suspicious lesions. No crepitus or fluctuance. No peri-wound warmth or erythema. No masses.. Wound #4 status is Healed - Epithelialized. Original cause of wound was Trauma. The wound is located on the Right,Lateral Malleolus. The wound measures 0cm length x 0cm width x 0cm depth; 0cm^2 area and 0cm^3 volume. The wound is limited to skin breakdown. There is no tunneling or undermining noted. There is a small amount of serous drainage noted. The wound margin is flat and intact.  There is large (67-100%) Callicott, Dietrich R. ([id number]) pink granulation within the wound bed. There is no necrotic tissue within the wound bed. The periwound skin appearance exhibited: Dry/Scaly, Moist. The periwound skin appearance did not exhibit: Callus, Crepitus, Excoriation, Fluctuance, Friable, Induration, Localized Edema, Rash, Scarring, Maceration, Atrophie Blanche, Cyanosis, Ecchymosis, Hemosiderin Staining, Mottled, Pallor, Rubor, Erythema. Periwound temperature was noted as No Abnormality. 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  He will do this every day through the year and I have impressed upon him that there is no taking breaks from this. He will also keep his appointment for his vascular surgery at the end of July and he is discharged from the wound care services and will come back and see as as needed. Plan Discharge From [facility]: Discharge from Wound Care Center I have recommended his compression stockings of 20-30 mmHg to be one first thing in the morning and remove last thing at night. He will do this every day through the year and I have impressed upon him that there is no taking breaks from this. He will also keep his appointment for his vascular surgery at the end of July and he is discharged from the wound care services and will come back and see as as needed. Electronic Signature(s) Signed: 08/08/2014 11:35:53 AM By: Evlyn Kanner MD, FACS [place], Jorje Guild ([id number]) Entered By: Evlyn Kanner on 08/08/2014 11:35:53 Gwinn, Jorje Guild ([id number]) -------------------------------------------------------------------------------- SuperBill Details Patient Name: [name]. Date of Service: 08/08/2014 Medical Record Number: [id number] Patient Account Number: [id number] Date of Birth/Sex: 1945/11/19 (69 y.o. Male) Treating RN: Primary Care Physician: HEARDChrissie Noa Other Clinician: Referring Physician: HEARD, Chrissie Noa Treating Physician/Extender: Rudene Re in Treatment: 5 Diagnosis Coding ICD-10 Codes Code Description [phone] Laceration without foreign body, right lower leg, initial encounter Chronic venous hypertension (idiopathic) with ulcer and inflammation of bilateral lower I87.333 extremity L97.312 Non-pressure chronic ulcer of right ankle with fat layer exposed E66.01 Morbid (severe) obesity due to excess calories Facility Procedures CPT4 Code: [id number] Description: [phone] - WOUND CARE VISIT-LEV 2 EST PT Modifier: Quantity: 1 Physician Procedures CPT4: Description Modifier Quantity Code [phone] [id number] - WC PHYS LEVEL 3 - EST PT 1 ICD-10 Description Diagnosis 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

## 2014-08-09 NOTE — Progress Notes (Signed)
[name], [name] ([id number]) Visit Report for 08/08/2014 Arrival Information Details Patient Name: [name], [name] Date of Service: 08/08/2014 11:00 AM Medical Record Number: [id number] Patient Account Number: [id number] Date of Birth/Sex: 03/20/1945 37(68 y.o. Male) Treating RN: [name], [name] Primary Care Physician: [name], [name] Other Clinician: Referring Physician: HEARD, [name] Treating Physician/Extender: [name] in Treatment: 5 Visit Information History Since Last Visit Added or deleted any medications: No Patient Arrived: [name] Any new allergies or adverse reactions: No Arrival Time: 11:08 Had a fall or experienced change in No Accompanied By: spouse activities of daily living that may affect Transfer Assistance: None risk of falls: Patient Identification Verified: Yes Signs or symptoms of abuse/neglect since last No Secondary Verification Process Yes visito Completed: Hospitalized since last visit: No Patient Has Alerts: Yes Pain Present Now: No Patient Alerts: 6/7/16ABI L 1.21 R 1.27 Electronic Signature(s) Signed: 08/08/2014 5:27:11 PM By: [name], [name] Entered By: [name], [name] on 08/08/2014 11:08:34 [name], [name] ([id number]) -------------------------------------------------------------------------------- Clinic Level of Care Assessment Details Patient Name: [name], [name] Date of Service: 08/08/2014 11:00 AM Medical Record Number: [id number] Patient Account Number: [id number] Date of Birth/Sex: 04/10/1945 [age] y.o. Male) Treating RN: [name], [name] Primary Care Physician: [name], [name] Other Clinician: Referring Physician: HEARD, [name] Treating Physician/Extender: [name] in Treatment: 5 Clinic Level of Care Assessment Items TOOL 4 Quantity Score []  - Use when only an EandM is performed on FOLLOW-UP visit 0 ASSESSMENTS - Nursing Assessment / Reassessment X - Reassessment of Co-morbidities (includes updates in patient status) 1  10 X - Reassessment of Adherence to Treatment Plan 1 5 ASSESSMENTS - Wound and Skin Assessment / Reassessment X - Simple Wound Assessment / Reassessment - one wound 1 5 []  - Complex Wound Assessment / Reassessment - multiple wounds 0 []  - Dermatologic / Skin Assessment (not related to wound area) 0 ASSESSMENTS - Focused Assessment X - Circumferential Edema Measurements - multi extremities 1 5 []  - Nutritional Assessment / Counseling / Intervention 0 X - Lower Extremity Assessment (monofilament, tuning fork, pulses) 1 5 []  - Peripheral Arterial Disease Assessment (using hand held doppler) 0 ASSESSMENTS - Ostomy and/or Continence Assessment and Care []  - Incontinence Assessment and Management 0 []  - Ostomy Care Assessment and Management (repouching, etc.) 0 PROCESS - Coordination of Care X - Simple Patient / Family Education for ongoing care 1 15 []  - Complex (extensive) Patient / Family Education for ongoing care 0 []  - Staff obtains ChiropractorConsents, Records, Test Results / Process Orders 0 []  - Staff telephones HHA, Nursing Homes / Clarify orders / etc 0 []  - Routine Transfer to another Facility (non-emergent condition) 0 Huffstetler, [name] ([id number]) []  - Routine Hospital Admission (non-emergent condition) 0 []  - New Admissions / Manufacturing engine[employer]surance Authorizations / Ordering NPWT, Apligraf, etc. 0 []  - Emergency Hospital Admission (emergent condition) 0 X - Simple Discharge Coordination 1 10 []  - Complex (extensive) Discharge Coordination 0 PROCESS - Special Needs []  - Pediatric / Minor Patient Management 0 []  - Isolation Patient Management 0 []  - Hearing / Language / Visual special needs 0 []  - Assessment of Community assistance (transportation, D/C planning, etc.) 0 []  - Additional assistance / Altered mentation 0 []  - Support Surface(s) Assessment (bed, cushion, seat, etc.) 0 INTERVENTIONS - Wound Cleansing / Measurement []  - Simple Wound Cleansing - one wound 0 []  - Complex Wound Cleansing -  multiple wounds 0 []  - Wound Imaging (photographs - any number of wounds) 0 []  - Wound Tracing (instead of photographs) 0 []  -  Simple Wound Measurement - one wound 0 []  - Complex Wound Measurement - multiple wounds 0 INTERVENTIONS - Wound Dressings []  - Small Wound Dressing one or multiple wounds 0 []  - Medium Wound Dressing one or multiple wounds 0 []  - Large Wound Dressing one or multiple wounds 0 []  - Application of Medications - topical 0 []  - Application of Medications - injection 0 INTERVENTIONS - Miscellaneous []  - External ear exam 0 Cheramie, [name] ([id number]) []  - Specimen Collection (cultures, biopsies, blood, body fluids, etc.) 0 []  - Specimen(s) / Culture(s) sent or taken to Lab for analysis 0 []  - Patient Transfer (multiple staff / Michiel Sites Lift / Similar devices) 0 []  - Simple Staple / Suture removal (25 or less) 0 []  - Complex Staple / Suture removal (26 or more) 0 []  - Hypo / Hyperglycemic Management (close monitor of Blood Glucose) 0 []  - Ankle / Brachial Index (ABI) - do not check if billed separately 0 X - Vital Signs 1 5 Has the patient been seen at the hospital within the last three years: Yes Total Score: 60 Level Of Care: New/Established - Level 2 Electronic Signature(s) Signed: 08/08/2014 5:27:11 PM By: [name] Sites Entered By: [name] Sites on 08/08/2014 11:31:25 Piekarski, [name] ([id number]) -------------------------------------------------------------------------------- Encounter Discharge Information Details Patient Name: [name] Alma. Date of Service: 08/08/2014 11:00 AM Medical Record Number: [id number] Patient Account Number: [id number] Date of Birth/Sex: 05-25-1945 (69 y.o. Male) Treating RN: [name] Sites Primary Care Physician: [name], [name] Noa Other Clinician: Referring Physician: HEARD, [name] Noa Treating Physician/Extender: [name] Re in Treatment: 5 Encounter Discharge Information Items Discharge Pain Level: 0 Discharge Condition:  Stable Ambulatory Status: Cane Discharge Destination: Home Private Transportation: Auto Accompanied By: spouse Schedule Follow-up Appointment: Yes Medication Reconciliation completed and No provided to Patient/Care Seung Nidiffer: Clinical Summary of Care: Electronic Signature(s) Signed: 08/08/2014 5:27:11 PM By: [name] Sites Entered By: [name] Sites on 08/08/2014 11:31:56 Meadors, [name] ([id number]) -------------------------------------------------------------------------------- Lower Extremity Assessment Details Patient Name: [name] Alma. Date of Service: 08/08/2014 11:00 AM Medical Record Number: [id number] Patient Account Number: [id number] Date of Birth/Sex: 26-Aug-1945 (69 y.o. Male) Treating RN: [name] Sites Primary Care Physician: [name], [name] Noa Other Clinician: Referring Physician: HEARD, [name] Noa Treating Physician/Extender: [name] Re in Treatment: 5 Edema Assessment Assessed: [Left: No] [Right: No] E[Left: dema] [Right: :] Calf Left: Right: Point of Measurement: 36 cm From Medial Instep cm 41.5 cm Ankle Left: Right: Point of Measurement: 11 cm From Medial Instep cm 26.3 cm Vascular Assessment Pulses: Posterior Tibial Palpable: [Right:Yes] Extremity colors, hair growth, and conditions: Extremity Color: [Right:Hyperpigmented] Hair Growth on Extremity: [Right:No] Temperature of Extremity: [Right:Warm] Capillary Refill: [Right:< 3 seconds] Toe Nail Assessment Left: Right: Thick: Yes Discolored: Yes Deformed: Yes Improper Length and Hygiene: No Electronic Signature(s) Signed: 08/08/2014 5:27:11 PM By: [name] Sites Entered By: [name] Sites on 08/08/2014 11:16:36 Brasel, [name] Kitchen ([id number]) -------------------------------------------------------------------------------- Multi Wound Chart Details Patient Name: [name] Pen R. Date of Service: 08/08/2014 11:00 AM Medical Record Number: [id number] Patient Account Number: [id number] Date of  Birth/Sex: Mar 15, 1945 (69 y.o. Male) Treating RN: [name] Sites Primary Care Physician: [name], [name] Noa Other Clinician: Referring Physician: HEARD, [name] Noa Treating Physician/Extender: [name] Re in Treatment: 5 Vital Signs Height(in): 68 Pulse(bpm): 86 Weight(lbs): 385 Blood Pressure 151/76 (mmHg): Body Mass Index(BMI): 59 Temperature(F): 98.3 Respiratory Rate 18 (breaths/min): Photos: [4:No Photos] [N/A:N/A] Wound Location: [4:Right Malleolus - Lateral] [N/A:N/A] Wounding Event: [4:Trauma] [N/A:N/A] Primary Etiology: [4:Trauma, Other] [N/A:N/A] Comorbid History: [4:Anemia, Sleep Apnea, Hypertension] [N/A:N/A] Date Acquired: [4:06/25/2014] [N/A:N/A] [name] of Treatment: [  4:5] [N/A:N/A] Wound Status: [4:Open] [N/A:N/A] Measurements L x W x D 0.2x0.2x0.1 [N/A:N/A] (cm) Area (cm) : [4:0.031] [N/A:N/A] Volume (cm) : [4:0.003] [N/A:N/A] % Reduction in Area: [4:99.80%] [N/A:N/A] % Reduction in Volume: 99.90% [N/A:N/A] Classification: [4:Full Thickness Without Exposed Support Structures] [N/A:N/A] Exudate Amount: [4:Small] [N/A:N/A] Exudate Type: [4:Serous] [N/A:N/A] Exudate Color: [4:amber] [N/A:N/A] Wound Margin: [4:Flat and Intact] [N/A:N/A] Granulation Amount: [4:Large (67-100%)] [N/A:N/A] Granulation Quality: [4:Pink] [N/A:N/A] Necrotic Amount: [4:None Present (0%)] [N/A:N/A] Exposed Structures: [4:Fascia: No Fat: No Tendon: No Muscle: No Joint: No] [N/A:N/A] Bone: No Limited to Skin Breakdown Epithelialization: Medium (34-66%) N/A N/A Periwound Skin Texture: Edema: No N/A N/A Excoriation: No Induration: No Callus: No Crepitus: No Fluctuance: No Friable: No Rash: No Scarring: No Periwound Skin Moist: Yes N/A N/A Moisture: Dry/Scaly: Yes Maceration: No Periwound Skin Color: Atrophie Blanche: No N/A N/A Cyanosis: No Ecchymosis: No Erythema: No Hemosiderin Staining: No Mottled: No Pallor: No Rubor: No Temperature: No Abnormality N/A  N/A Tenderness on No N/A N/A Palpation: Wound Preparation: Ulcer Cleansing: Other: N/A N/A soap and water Topical Anesthetic Applied: Other: lidocaine 4% Treatment Notes Electronic Signature(s) Signed: 08/08/2014 5:27:11 PM By: [name] Sites Entered By: [name] Sites on 08/08/2014 11:24:28 [name] Alma ([id number]) -------------------------------------------------------------------------------- Multi-Disciplinary Care Plan Details Patient Name: [name] Alma. Date of Service: 08/08/2014 11:00 AM Medical Record Number: [id number] Patient Account Number: [id number] Date of Birth/Sex: November 10, 1945 (69 y.o. Male) Treating RN: [name] Sites Primary Care Physician: [name], [name] Noa Other Clinician: Referring Physician: HEARD, [name] Noa Treating Physician/Extender: [name] Re in Treatment: 5 Active Inactive Electronic Signature(s) Signed: 08/08/2014 5:27:11 PM By: [name] Sites Entered By: [name] Sites on 08/08/2014 11:30:55 Dugar, [name] ([id number]) -------------------------------------------------------------------------------- Patient/Caregiver Education Details Patient Name: [name] Alma. Date of Service: 08/08/2014 11:00 AM Medical Record Number: [id number] Patient Account Number: [id number] Date of Birth/Gender: 05/28/1945 (69 y.o. Male) Treating RN: [name] Sites Primary Care Physician: [name], [name] Noa Other Clinician: Referring Physician: HEARD, [name] Noa Treating Physician/Extender: [name] Re in Treatment: 5 Education Assessment Education Provided To: Patient and Caregiver Education Topics Provided Venous: Handouts: Other: continue compression hose morning to night Methods: Explain/Verbal Responses: State content correctly Electronic Signature(s) Signed: 08/08/2014 5:27:11 PM By: [name] Sites Entered By: [name] Sites on 08/08/2014 11:32:21 [name], [name]  ([id number]) -------------------------------------------------------------------------------- Wound Assessment Details Patient Name: [name] Pen R. Date of Service: 08/08/2014 11:00 AM Medical Record Number: [id number] Patient Account Number: [id number] Date of Birth/Sex: Dec 09, 1945 (69 y.o. Male) Treating RN: [name] Sites Primary Care Physician: [name], [name] Noa Other Clinician: Referring Physician: HEARD, [name] Noa Treating Physician/Extender: [name] Re in Treatment: 5 Wound Status Wound Number: 4 Primary Etiology: Trauma, Other Wound Location: Right, Lateral Malleolus Wound Status: Healed - Epithelialized Wounding Event: Trauma Comorbid Anemia, Sleep Apnea, History: Hypertension Date Acquired: 06/25/2014 [name] Of Treatment: 5 Clustered Wound: No Photos Photo Uploaded By: Elliot Gurney, RN, BSN, Kim on 08/08/2014 13:08:37 Wound Measurements Length: (cm) 0 % Reduction in Width: (cm) 0 % Reduction in Depth: (cm) 0 Epithelializat Area: (cm) 0 Tunneling: Volume: (cm) 0 Undermining: Area: 100% Volume: 100% ion: Medium (34-66%) No No Wound Description Full Thickness Without Exposed Classification: Support Structures Wound Margin: Flat and Intact Exudate Small Amount: Exudate Type: Serous Exudate Color: amber Foul Odor After Cleansing: No Wound Bed Granulation Amount: Large (67-100%) Exposed Structure Granulation Quality: Pink Fascia Exposed: No Necrotic Amount: None Present (0%) Fat Layer Exposed: No Thurgood, [name] ([id number]) Tendon Exposed: No Muscle Exposed: No Joint Exposed: No Bone Exposed: No Limited to Skin Breakdown Periwound Skin Texture Texture Color No Abnormalities  Noted: No No Abnormalities Noted: No Callus: No Atrophie Blanche: No Crepitus: No Cyanosis: No Excoriation: No Ecchymosis: No Fluctuance: No Erythema: No Friable: No Hemosiderin Staining: No Induration: No Mottled: No Localized Edema: No Pallor: No Rash: No Rubor:  No Scarring: No Temperature / Pain Moisture Temperature: No Abnormality No Abnormalities Noted: No Dry / Scaly: Yes Maceration: No Moist: Yes Wound Preparation Ulcer Cleansing: Other: soap and water, Topical Anesthetic Applied: Other: lidocaine 4%, Electronic Signature(s) Signed: 08/08/2014 5:27:11 PM By: [name] Sites Entered By: [name] Sites on 08/08/2014 11:30:08 [name], [name] ([id number]) -------------------------------------------------------------------------------- Vitals Details Patient Name: [name] Alma. Date of Service: 08/08/2014 11:00 AM Medical Record Number: [id number] Patient Account Number: [id number] Date of Birth/Sex: [birth year] (69 y.o. Male) Treating RN: [name] Sites Primary Care Physician: [name], [name] Noa Other Clinician: Referring Physician: HEARD, [name] Noa Treating Physician/Extender: [name] Re in Treatment: 5 Vital Signs Time Taken: 11:09 Temperature (F): 98.3 Height (in): 68 Pulse (bpm): 86 Weight (lbs): 385 Respiratory Rate (breaths/min): 18 Body Mass Index (BMI): 58.5 Blood Pressure (mmHg): 151/76 Reference Range: 80 - 120 mg / dl Electronic Signature(s) Signed: 08/08/2014 5:27:11 PM By: [name] Sites Entered By: [name] Sites on 08/08/2014 11:10:52

## 2014-11-28 DIAGNOSIS — M25519 Pain in unspecified shoulder: Secondary | ICD-10-CM | POA: Insufficient documentation

## 2014-11-28 DIAGNOSIS — M25559 Pain in unspecified hip: Secondary | ICD-10-CM | POA: Insufficient documentation

## 2014-12-10 ENCOUNTER — Ambulatory Visit
Admission: EM | Admit: 2014-12-10 | Discharge: 2014-12-10 | Disposition: A | Discharge status: Home / Self Care | Payer: Medicare Other | Attending: Family Medicine | Admitting: Family Medicine

## 2014-12-10 ENCOUNTER — Encounter: Payer: Self-pay | Admitting: *Deleted

## 2014-12-10 DIAGNOSIS — N401 Enlarged prostate with lower urinary tract symptoms: Secondary | ICD-10-CM

## 2014-12-10 DIAGNOSIS — R338 Other retention of urine: Secondary | ICD-10-CM | POA: Insufficient documentation

## 2014-12-10 DIAGNOSIS — N390 Urinary tract infection, site not specified: Secondary | ICD-10-CM | POA: Insufficient documentation

## 2014-12-10 DIAGNOSIS — N400 Enlarged prostate without lower urinary tract symptoms: Secondary | ICD-10-CM | POA: Diagnosis not present

## 2014-12-10 DIAGNOSIS — R350 Frequency of micturition: Secondary | ICD-10-CM | POA: Diagnosis present

## 2014-12-10 LAB — URINALYSIS COMPLETE WITH MICROSCOPIC (ARMC ONLY)
BILIRUBIN URINE: NEGATIVE
GLUCOSE, UA: NEGATIVE mg/dL
KETONES UR: NEGATIVE mg/dL
Nitrite: NEGATIVE
Protein, ur: NEGATIVE mg/dL
SPECIFIC GRAVITY, URINE: 1.02 (ref 1.005–1.030)
pH: 6 (ref 5.0–8.0)

## 2014-12-10 MED ORDER — CIPROFLOXACIN HCL 500 MG PO TABS: 500.0000 mg | ORAL_TABLET | Freq: Two times a day (BID) | ORAL | Status: DC

## 2014-12-10 NOTE — ED Notes (Signed)
Urinary pain, odor and painful urination x three days.  U/A  pending.

## 2014-12-10 NOTE — Discharge Instructions (Signed)
Benign Prostatic Hypertrophy The prostate gland is part of the reproductive system of men. A normal prostate is about the size and shape of a walnut. The prostate gland produces a fluid that is mixed with sperm to make semen. This gland surrounds the urethra and is located in front of the rectum and just below the bladder. The bladder is where urine is stored. The urethra is the tube through which urine passes from the bladder to get out of the body. The prostate grows as a man ages. An enlarged prostate not caused by cancer is called benign prostatic hypertrophy (BPH). An enlarged prostate can press on the urethra. This can make it harder to pass urine. In the early stages of enlargement, the bladder can get by with a narrowed urethra by forcing the urine through. If the problem gets worse, medical or surgical treatment may be required.  This condition should be followed by your health care provider. The accumulation of urine in the bladder can cause infection. Back pressure and infection can progress to bladder damage and kidney (renal) failure. If needed, your health care provider may refer you to a specialist in kidney and prostate disease (urologist). CAUSES  BPH is a common health problem in men older than 50 years. This condition is a normal part of aging. However, not all men will develop problems from this condition. If the enlargement grows away from the urethra, then there will not be any compression of the urethra and resistance to urine flow.If the growth is toward the urethra and compresses it, you will experience difficulty urinating.  SYMPTOMS   Not able to completely empty your bladder.  Getting up often during the night to urinate.  Need to urinate frequently during the day.  Difficultly starting urine flow.  Decrease in size and strength of your urine stream.  Dribbling after urination.  Pain on urination (more common with infection).  Inability to pass urine. This needs  immediate treatment.  The development of a urinary tract infection. DIAGNOSIS  These tests will help your health care provider understand your problem:  A thorough history and physical examination.  A urination history, with the number of times you urinate, the amounts of urine, the strength of the urine stream, and the feeling of emptiness or fullness after urinating.  A postvoid bladder scan that measures any amount of urine that may remain in your bladder after you finish urinating.  Digital rectal exam. In a rectal exam, your health care provider checks your prostate by putting a gloved, lubricated finger into your rectum to feel the back of your prostate gland. This exam detects the size of your gland and abnormal lumps or growths.  Exam of your urine (urinalysis).  Prostate specific antigen (PSA) screening. This is a blood test used to screen for prostate cancer.  Rectal ultrasonography. This test uses sound waves to electronically produce a picture of your prostate gland. TREATMENT  Once symptoms begin, your health care provider will monitor your condition. Of the men with this condition, one third will have symptoms that stabilize, one third will have symptoms that improve, and one third will have symptoms that progress in the first year. Mild symptoms may not need treatment. Simple observation and yearly exams may be all that is required. Medicines and surgery are options for more severe problems. Your health care provider can help you make an informed decision for what is best. Two classes of medicines are available for relief of prostate symptoms:  Medicines  that shrink the prostate. This helps relieve symptoms. These medicines take time to work, and it may be months before any improvement is seen.  Uncommon side effects include problems with sexual function.  Medicines to relax the muscle of the prostate. This also relieves the obstruction by reducing any compression on the  urethra.This group of medicines work much faster than those that reduce the size of the prostate gland. Usually, one can experience improvement in days to weeks..  Side effects can include dizziness, fatigue, lightheadedness, and retrograde ejaculation (diminished volume of ejaculate). Several types of surgical treatments are available for relief of prostate symptoms:  Transurethral resection of the prostate (TURP)--In this treatment, an instrument is inserted through opening at the tip of the penis. It is used to cut away pieces of the inner core of the prostate. The pieces are removed through the same opening of the penis. This removes the obstruction and helps get rid of the symptoms.  Transurethral incision (TUIP)--In this procedure, small cuts are made in the prostate. This lessens the prostates pressure on the urethra.  Transurethral microwave thermotherapy (TUMT)--This procedure uses microwaves to create heat. The heat destroys and removes a small amount of prostate tissue.  Transurethral needle ablation (TUNA)--This is a procedure that uses radio frequencies to do the same as TUMT.  Interstitial laser coagulation (ILC)--This is a procedure that uses a laser to do the same as TUMT and TUNA.  Transurethral electrovaporization (TUVP)--This is a procedure that uses electrodes to do the same as the procedures listed above. SEEK MEDICAL CARE IF:   You develop a fever.  There is unexplained back pain.  Symptoms are not helped by medicines prescribed.  You develop side effects from the medicine you are taking.  Your urine becomes very dark or has a bad smell.  Your lower abdomen becomes distended and you have difficulty passing your urine. SEEK IMMEDIATE MEDICAL CARE IF:   You are suddenly unable to urinate. This is an emergency. You should be seen immediately.  There are large amounts of blood or clots in the urine.  Your urinary problems become unmanageable.  You develop  lightheadedness, severe dizziness, or you feel faint.  You develop moderate to severe low back or flank pain.  You develop chills or fever.   This information is not intended to replace advice given to you by your health care provider. Make sure you discuss any questions you have with your health care provider.   Document Released: 01/13/2005 Document Revised: 01/18/2013 Document Reviewed: 07/29/2012 Elsevier Interactive Patient Education 2016 [employer] Urinary Tract Infection Urinary tract infections (UTIs) can develop anywhere along your urinary tract. Your urinary tract is your body's drainage system for removing wastes and extra water. Your urinary tract includes two kidneys, two ureters, a bladder, and a urethra. Your kidneys are a pair of bean-shaped organs. Each kidney is about the size of your fist. They are located below your ribs, one on each side of your spine. CAUSES Infections are caused by microbes, which are microscopic organisms, including fungi, viruses, and bacteria. These organisms are so small that they can only be seen through a microscope. Bacteria are the microbes that most commonly cause UTIs. SYMPTOMS  Symptoms of UTIs may vary by age and gender of the patient and by the location of the infection. Symptoms in young women typically include a frequent and intense urge to urinate and a painful, burning feeling in the bladder or urethra during urination. Older women and men are   more likely to be tired, shaky, and weak and have muscle aches and abdominal pain. A fever may mean the infection is in your kidneys. Other symptoms of a kidney infection include pain in your back or sides below the ribs, nausea, and vomiting. DIAGNOSIS To diagnose a UTI, your caregiver will ask you about your symptoms. Your caregiver will also ask you to provide a urine sample. The urine sample will be tested for bacteria and white blood cells. White blood cells are made by your body to help fight  infection. TREATMENT  Typically, UTIs can be treated with medication. Because most UTIs are caused by a bacterial infection, they usually can be treated with the use of antibiotics. The choice of antibiotic and length of treatment depend on your symptoms and the type of bacteria causing your infection. HOME CARE INSTRUCTIONS  If you were prescribed antibiotics, take them exactly as your caregiver instructs you. Finish the medication even if you feel better after you have only taken some of the medication.  Drink enough water and fluids to keep your urine clear or pale yellow.  Avoid caffeine, tea, and carbonated beverages. They tend to irritate your bladder.  Empty your bladder often. Avoid holding urine for long periods of time.  Empty your bladder before and after sexual intercourse.  After a bowel movement, women should cleanse from front to back. Use each tissue only once. SEEK MEDICAL CARE IF:   You have back pain.  You develop a fever.  Your symptoms do not begin to resolve within 3 days. SEEK IMMEDIATE MEDICAL CARE IF:   You have severe back pain or lower abdominal pain.  You develop chills.  You have nausea or vomiting.  You have continued burning or discomfort with urination. MAKE SURE YOU:   Understand these instructions.  Will watch your condition.  Will get help right away if you are not doing well or get worse.   This information is not intended to replace advice given to you by your health care provider. Make sure you discuss any questions you have with your health care provider.   Document Released: 10/23/2004 Document Revised: 10/04/2014 Document Reviewed: 02/21/2011 Elsevier Interactive Patient Education 2016 Elsevier Inc[employer]  

## 2014-12-10 NOTE — ED Provider Notes (Signed)
CSN: [id number]     Arrival date & time 12/10/14  1315 History   First MD Initiated Contact with Patient 12/10/14 1501     Chief Complaint  Patient presents with  . Urinary Frequency   (Consider location/radiation/quality/duration/timing/severity/associated sxs/prior Treatment) HPI Comments: Married caucasian male here for evaluation of urinary frequency q1/2 hour, odor and burning.  Has had UTI earlier this year and was treated with cipro and reported symptom resolution.  PMHx BPH, obesity, chronic bacterial prostatitis  Reports some low abdomen and back discomfort.  Denied gross hematuria, cloudy urine/headache, nausea, vomiting, diarrhea, rash, fever, chills.  The history is provided by the patient and a relative.    Past Medical History  Diagnosis Date  . Cellulitis     foot  . GERD (gastroesophageal reflux disease)   . Hypertension   . Obstructive sleep apnea   . BPH (benign prostatic hypertrophy) with urinary retention   . Chronic bacterial prostatitis   . Morbid obesity with BMI of 50.0-59.9, adult (HCC)   . Stasis dermatitis of both legs   . H/O: upper GI bleed    Past Surgical History  Procedure Laterality Date  . Hip surgery      right  . Gastric bypass    . Cholecystectomy    . Colonoscopy  2006  . Pelvic examination under anesthesia cystourethroscopy / sigmoidoscopy  2015  . Hip replacement Right 2005   Family History  Problem Relation Age of Onset  . Diabetes Mother    Social History  Substance Use Topics  . Smoking status: Never Smoker   . Smokeless tobacco: None  . Alcohol Use: No    Review of Systems  Constitutional: Negative for fever, chills, diaphoresis, activity change, appetite change, fatigue and unexpected weight change.  HENT: Negative for congestion, dental problem, drooling, ear discharge, ear pain, facial swelling, mouth sores, nosebleeds and postnasal drip.   Eyes: Negative for photophobia, pain, discharge, redness, itching and visual  disturbance.  Respiratory: Positive for cough. Negative for choking, chest tightness, shortness of breath, wheezing and stridor.   Cardiovascular: Positive for leg swelling. Negative for chest pain and palpitations.  Gastrointestinal: Positive for abdominal pain. Negative for nausea, vomiting, diarrhea, constipation, blood in stool and abdominal distention.  Endocrine: Negative for cold intolerance and heat intolerance.  Genitourinary: Positive for dysuria, urgency and frequency. Negative for hematuria, flank pain, decreased urine volume, discharge, penile swelling, scrotal swelling, enuresis, difficulty urinating, genital sores, penile pain and testicular pain.  Musculoskeletal: Negative for myalgias, back pain, joint swelling, arthralgias, gait problem, neck pain and neck stiffness.  Skin: Negative for color change, pallor, rash and wound.  Allergic/Immunologic: Negative for environmental allergies, food allergies and immunocompromised state.  Neurological: Negative for dizziness, tremors, seizures, syncope, facial asymmetry, speech difficulty, weakness, light-headedness, numbness and headaches.  Hematological: Negative for adenopathy. Does not bruise/bleed easily.  Psychiatric/Behavioral: Negative for behavioral problems, confusion, sleep disturbance and agitation.    Allergies  Aspirin  Home Medications   Prior to Admission medications   Medication Sig Start Date End Date Taking? Authorizing Provider  ciprofloxacin (CIPRO) 500 MG tablet Take 1 tablet (500 mg total) by mouth 2 (two) times daily. 12/10/14   Barbaraann Barthel, NP  mupirocin ointment (BACTROBAN) 2 % Apply to wound twice a day until healed 06/30/14   Barbaraann Barthel, NP  omeprazole (PRILOSEC) 20 MG capsule Take 20 mg by mouth daily.    Historical Provider, MD  tamsulosin (FLOMAX) 0.4 MG CAPS capsule Take 0.4 mg  by mouth.    Historical Provider, MD  triamterene-hydrochlorothiazide (DYAZIDE) 37.5-25 MG per capsule Take 1  capsule by mouth daily.    Historical Provider, MD   Meds Ordered and Administered this Visit  Medications - No data to display  BP 112/69 mmHg  Pulse 80  Temp(Src) 97.8 F (36.6 C) (Oral)  Resp 22  Ht 5\' 10"  (1.778 m)  Wt 364 lb (165.109 kg)  BMI 52.23 kg/m2 No data found.   Physical Exam  Constitutional: He is oriented to person, place, and time. Vital signs are normal. He appears well-developed and well-nourished. He is active and cooperative.  Non-toxic appearance. He does not have a sickly appearance. He does not appear ill. No distress.  HENT:  Head: Normocephalic and atraumatic.  Right Ear: External ear and ear canal normal. A middle ear effusion is present.  Left Ear: Hearing, external ear and ear canal normal. A middle ear effusion is present.  Nose: No mucosal edema, rhinorrhea, nose lacerations, sinus tenderness, nasal deformity, septal deviation or nasal septal hematoma. No epistaxis.  No foreign bodies. Right sinus exhibits no maxillary sinus tenderness and no frontal sinus tenderness. Left sinus exhibits no maxillary sinus tenderness and no frontal sinus tenderness.  Mouth/Throat: Uvula is midline and mucous membranes are normal. Mucous membranes are not pale, not dry and not cyanotic. He does not have dentures. No oral lesions. No trismus in the jaw. Normal dentition. No dental abscesses, uvula swelling, lacerations or dental caries. Posterior oropharyngeal edema and posterior oropharyngeal erythema present. No oropharyngeal exudate or tonsillar abscesses.  Eyes: Conjunctivae, EOM and lids are normal. Pupils are equal, round, and reactive to light. Right eye exhibits no chemosis, no discharge, no exudate and no hordeolum. No foreign body present in the right eye. Left eye exhibits no chemosis, no discharge, no exudate and no hordeolum. No foreign body present in the left eye. Right conjunctiva is not injected. Right conjunctiva has no hemorrhage. Left conjunctiva is not  injected. Left conjunctiva has no hemorrhage. No scleral icterus. Right eye exhibits normal extraocular motion and no nystagmus. Left eye exhibits normal extraocular motion and no nystagmus. Right pupil is round and reactive. Left pupil is round and reactive. Pupils are equal.  Neck: Trachea normal and normal range of motion. Neck supple. No tracheal tenderness, no spinous process tenderness and no muscular tenderness present. No rigidity. No tracheal deviation, no edema, no erythema and normal range of motion present. No thyroid mass and no thyromegaly present.  Cardiovascular: Normal rate, regular rhythm, S1 normal, S2 normal, normal heart sounds and intact distal pulses.  PMI is not displaced.  Exam reveals no gallop and no friction rub.   No murmur heard. Pulses:      Dorsalis pedis pulses are 2+ on the right side, and 2+ on the left side.  Bilateral lower extremity swelling 2+/4 bilaterally  Pulmonary/Chest: Effort normal and breath sounds normal. No accessory muscle usage or stridor. No respiratory distress. He has no decreased breath sounds. He has no wheezes. He has no rhonchi. He has no rales.  Abdominal: Soft. Bowel sounds are normal. He exhibits no shifting dullness, no distension, no pulsatile liver, no fluid wave, no abdominal bruit, no ascites, no pulsatile midline mass and no mass. There is no hepatosplenomegaly. There is no tenderness. There is no rigidity, no rebound, no guarding, no CVA tenderness, no tenderness at McBurney's point and negative Murphy's sign. Hernia confirmed negative in the ventral area.  Musculoskeletal: Normal range of motion. He exhibits  edema. He exhibits no tenderness.       Right shoulder: Normal.       Left shoulder: Normal.       Right elbow: Normal.      Left elbow: Normal.       Right hip: Normal.       Left hip: Normal.       Right knee: Normal.       Left knee: Normal.       Right ankle: He exhibits swelling. He exhibits normal range of motion, no  ecchymosis, no deformity, no laceration and normal pulse. No tenderness. Achilles tendon normal.       Left ankle: He exhibits swelling. He exhibits normal range of motion, no ecchymosis, no deformity, no laceration and normal pulse. No tenderness. Achilles tendon normal.       Cervical back: Normal.       Thoracic back: Normal.       Lumbar back: Normal.       Right hand: Normal.       Left hand: Normal.       Right lower leg: He exhibits edema. He exhibits no tenderness, no bony tenderness, no swelling, no deformity and no laceration.       Left lower leg: He exhibits edema. He exhibits no tenderness, no bony tenderness, no deformity and no laceration.  Lymphadenopathy:       Head (right side): No submental, no submandibular, no tonsillar, no preauricular, no posterior auricular and no occipital adenopathy present.       Head (left side): No submental, no submandibular, no tonsillar, no preauricular, no posterior auricular and no occipital adenopathy present.    He has no cervical adenopathy.       Right cervical: No superficial cervical, no deep cervical and no posterior cervical adenopathy present.      Left cervical: No superficial cervical, no deep cervical and no posterior cervical adenopathy present.  Neurological: He is alert and oriented to person, place, and time. He displays no atrophy and no tremor. No cranial nerve deficit or sensory deficit. He exhibits normal muscle tone. He displays no seizure activity. Coordination and gait normal. GCS eye subscore is 4. GCS verbal subscore is 5. GCS motor subscore is 6.  Skin: Skin is warm, dry and intact. No abrasion, no bruising, no burn, no ecchymosis, no laceration, no lesion, no petechiae and no rash noted. He is not diaphoretic. No cyanosis or erythema. No pallor. Nails show no clubbing.  Psychiatric: He has a normal mood and affect. His speech is normal and behavior is normal. Judgment and thought content normal. Cognition and memory are  normal.  Nursing note and vitals reviewed. patient had urine odor to his clothing; using cane to ambulate  ED Course  Procedures (including critical care time)  Labs Review Labs Reviewed  URINALYSIS COMPLETEWITH MICROSCOPIC ([facility] ONLY) - Abnormal; Notable for the following:    APPearance HAZY (*)    Hgb urine dipstick 1+ (*)    Leukocytes, UA 3+ (*)    Bacteria, UA FEW (*)    Squamous Epithelial / LPF 0-5 (*)    All other components within normal limits   1500 discussed lab results with patient and son along with previous urine culture results on file one contaminated and the other with  Klebsiella oxytoca.  Given copy of report to share with [facility] and urology. Typically urine culture results available in 48 hours will call with results once available.  Patient and  son verbalized understanding of information and had no further questions at this time.  Imaging Review No results found.  Urine Culture6/08/2013  [facility]  Component Name Value Range  Urine Culture, Comprehensive Reference Range: (Lower detectable limit 1000 cfu/ml) (Lower detectable limit for Acute dysuria, Suprapubic tap, and Post-prostatic massage specimen types = 100 cfu/ml) (A)   Organism Klebsiella oxytoca (A)   Urine Culture, Comprehensive >100,000 CFU/mL    Specimen  Urine - Clean Catch   Organism Antibiotic Method Susceptibility  _Klebsiella oxytoca Ampicillin KB SUSCEPTIBILITY RESULT Resistant   Ampicillin/Sulbactam KB SUSCEPTIBILITY RESULT Intermediate   Cefazolin KB SUSCEPTIBILITY RESULT Resistant   Ceftriaxone KB SUSCEPTIBILITY RESULT Susceptible   Ciprofloxacin KB SUSCEPTIBILITY RESULT Susceptible   Gentamicin KB SUSCEPTIBILITY RESULT Susceptible   Levofloxacin KB SUSCEPTIBILITY RESULT Susceptible   Nitrofurantoin KB SUSCEPTIBILITY RESULT Susceptible   Tobramycin KB SUSCEPTIBILITY RESULT Susceptible   Trimethoprim/Sulfa KB SUSCEPTIBILITY RESULT Susceptible      MDM   1. UTI (lower  urinary tract infection)   2. BPH (benign prostatic hypertrophy) with urinary retention    Continue flomax 0.4mg  po daily.  Patient preferred to take cipro again  po BID x 10 days. Bactrim interacts with patient dyazide and he prefers to not take if other alternatives. Rx given.  Medications as directed.  Patient is also to push fluids and may use Pyridium  po TID as needed.  Hydrate, avoid dehydration.  Avoid holding urine void on frequent basis every 4 to 6 hours.  If unable to void every 8 hours follow up for re-evaluation with PCM, urgent care or ER. Keep follow up with urology as scheduled this month for recurrent UTIs and BPH.  Call or return to clinic as needed if these symptoms worsen or fail to improve as anticipated.  Exitcare handout on cystitis given to patient Patient verbalized agreement and understanding of treatment plan and had no further questions at this time. P2:  Hydrate and cranberry juice    Barbaraann Barthel, NP 12/11/14 1952

## 2014-12-13 LAB — URINE CULTURE: Special Requests: NORMAL

## 2014-12-25 ENCOUNTER — Encounter: Payer: Self-pay | Admitting: Emergency Medicine

## 2014-12-25 ENCOUNTER — Ambulatory Visit
Admission: EM | Admit: 2014-12-25 | Discharge: 2014-12-25 | Disposition: A | Discharge status: Home / Self Care | Payer: Medicare Other | Attending: Family Medicine | Admitting: Family Medicine

## 2014-12-25 DIAGNOSIS — I10 Essential (primary) hypertension: Secondary | ICD-10-CM | POA: Diagnosis not present

## 2014-12-25 DIAGNOSIS — Z6841 Body Mass Index (BMI) 40.0 and over, adult: Secondary | ICD-10-CM | POA: Diagnosis not present

## 2014-12-25 DIAGNOSIS — I872 Venous insufficiency (chronic) (peripheral): Secondary | ICD-10-CM | POA: Diagnosis not present

## 2014-12-25 DIAGNOSIS — Z79899 Other long term (current) drug therapy: Secondary | ICD-10-CM | POA: Diagnosis not present

## 2014-12-25 DIAGNOSIS — N390 Urinary tract infection, site not specified: Secondary | ICD-10-CM | POA: Diagnosis not present

## 2014-12-25 DIAGNOSIS — K219 Gastro-esophageal reflux disease without esophagitis: Secondary | ICD-10-CM | POA: Insufficient documentation

## 2014-12-25 DIAGNOSIS — G4733 Obstructive sleep apnea (adult) (pediatric): Secondary | ICD-10-CM | POA: Insufficient documentation

## 2014-12-25 DIAGNOSIS — R300 Dysuria: Secondary | ICD-10-CM | POA: Diagnosis present

## 2014-12-25 LAB — URINALYSIS COMPLETE WITH MICROSCOPIC (ARMC ONLY)
Bilirubin Urine: NEGATIVE
GLUCOSE, UA: NEGATIVE mg/dL
Ketones, ur: NEGATIVE mg/dL
Nitrite: NEGATIVE
PROTEIN: NEGATIVE mg/dL
Specific Gravity, Urine: 1.02 (ref 1.005–1.030)
pH: 6 (ref 5.0–8.0)

## 2014-12-25 MED ORDER — LEVOFLOXACIN 500 MG PO TABS: 500.0000 mg | ORAL_TABLET | Freq: Every day | ORAL | Status: DC

## 2014-12-25 NOTE — ED Notes (Signed)
Patient c/o burning when urinating for 3-4 days.  Patient denies back pain.  Patient denies fevers. Patient denies N/V.

## 2014-12-27 LAB — URINE CULTURE: Special Requests: NORMAL

## 2015-01-16 ENCOUNTER — Encounter: Payer: Self-pay | Admitting: Urgent Care

## 2015-01-16 ENCOUNTER — Emergency Department: Payer: Medicare Other

## 2015-01-16 ENCOUNTER — Observation Stay: Payer: Medicare Other

## 2015-01-16 ENCOUNTER — Observation Stay
Admission: EM | Admit: 2015-01-16 | Discharge: 2015-01-16 | Disposition: A | Discharge status: Home / Self Care | Payer: Medicare Other | Attending: Internal Medicine | Admitting: Internal Medicine

## 2015-01-16 DIAGNOSIS — R338 Other retention of urine: Secondary | ICD-10-CM | POA: Diagnosis not present

## 2015-01-16 DIAGNOSIS — N179 Acute kidney failure, unspecified: Secondary | ICD-10-CM | POA: Insufficient documentation

## 2015-01-16 DIAGNOSIS — B9689 Other specified bacterial agents as the cause of diseases classified elsewhere: Secondary | ICD-10-CM | POA: Insufficient documentation

## 2015-01-16 DIAGNOSIS — K219 Gastro-esophageal reflux disease without esophagitis: Secondary | ICD-10-CM | POA: Diagnosis not present

## 2015-01-16 DIAGNOSIS — I10 Essential (primary) hypertension: Secondary | ICD-10-CM | POA: Insufficient documentation

## 2015-01-16 DIAGNOSIS — G4733 Obstructive sleep apnea (adult) (pediatric): Secondary | ICD-10-CM | POA: Insufficient documentation

## 2015-01-16 DIAGNOSIS — N401 Enlarged prostate with lower urinary tract symptoms: Principal | ICD-10-CM | POA: Insufficient documentation

## 2015-01-16 DIAGNOSIS — Z886 Allergy status to analgesic agent status: Secondary | ICD-10-CM | POA: Diagnosis not present

## 2015-01-16 DIAGNOSIS — Z6841 Body Mass Index (BMI) 40.0 and over, adult: Secondary | ICD-10-CM | POA: Insufficient documentation

## 2015-01-16 DIAGNOSIS — R339 Retention of urine, unspecified: Secondary | ICD-10-CM | POA: Diagnosis present

## 2015-01-16 DIAGNOSIS — Z79899 Other long term (current) drug therapy: Secondary | ICD-10-CM | POA: Diagnosis not present

## 2015-01-16 DIAGNOSIS — N390 Urinary tract infection, site not specified: Secondary | ICD-10-CM | POA: Diagnosis not present

## 2015-01-16 DIAGNOSIS — R3989 Other symptoms and signs involving the genitourinary system: Secondary | ICD-10-CM

## 2015-01-16 DIAGNOSIS — N3000 Acute cystitis without hematuria: Secondary | ICD-10-CM

## 2015-01-16 DIAGNOSIS — I872 Venous insufficiency (chronic) (peripheral): Secondary | ICD-10-CM | POA: Diagnosis not present

## 2015-01-16 DIAGNOSIS — N471 Phimosis: Secondary | ICD-10-CM

## 2015-01-16 LAB — COMPREHENSIVE METABOLIC PANEL
ALBUMIN: 4 g/dL (ref 3.5–5.0)
ALT: 16 U/L — ABNORMAL LOW (ref 17–63)
ANION GAP: 8 (ref 5–15)
AST: 14 U/L — ABNORMAL LOW (ref 15–41)
Alkaline Phosphatase: 84 U/L (ref 38–126)
BILIRUBIN TOTAL: 0.8 mg/dL (ref 0.3–1.2)
BUN: 29 mg/dL — ABNORMAL HIGH (ref 6–20)
CALCIUM: 8.8 mg/dL — AB (ref 8.9–10.3)
CO2: 25 mmol/L (ref 22–32)
Chloride: 106 mmol/L (ref 101–111)
Creatinine, Ser: 1.28 mg/dL — ABNORMAL HIGH (ref 0.61–1.24)
GFR calc Af Amer: >60 mL/min (ref >60)
GFR calc non Af Amer: 56 mL/min — ABNORMAL LOW (ref >60)
Glucose, Bld: 135 mg/dL — ABNORMAL HIGH (ref 65–99)
POTASSIUM: 4 mmol/L (ref 3.5–5.1)
SODIUM: 139 mmol/L (ref 135–145)
TOTAL PROTEIN: 8.1 g/dL (ref 6.5–8.1)

## 2015-01-16 LAB — URINALYSIS COMPLETE WITH MICROSCOPIC (ARMC ONLY)
Bacteria, UA: NONE SEEN
Bilirubin Urine: NEGATIVE
Glucose, UA: NEGATIVE mg/dL
Ketones, ur: NEGATIVE mg/dL
Nitrite: NEGATIVE
Protein, ur: 30 mg/dL — AB
Specific Gravity, Urine: 1.017 (ref 1.005–1.030)
pH: 6 (ref 5.0–8.0)

## 2015-01-16 LAB — CBC
HCT: 39.6 % — ABNORMAL LOW (ref 40.0–52.0)
HEMOGLOBIN: 12.9 g/dL — AB (ref 13.0–18.0)
MCH: 29.9 pg (ref 26.0–34.0)
MCHC: 32.7 g/dL (ref 32.0–36.0)
MCV: 91.6 fL (ref 80.0–100.0)
Platelets: 228 10*3/uL (ref 150–440)
RBC: 4.32 MIL/uL — ABNORMAL LOW (ref 4.40–5.90)
RDW: 12.7 % (ref 11.5–14.5)
WBC: 14.1 10*3/uL — ABNORMAL HIGH (ref 3.8–10.6)

## 2015-01-16 LAB — TSH: TSH: 2.678 u[IU]/mL (ref 0.350–4.500)

## 2015-01-16 LAB — HEMOGLOBIN A1C: Hgb A1c MFr Bld: 5.1 % (ref 4.0–6.0)

## 2015-01-16 MED ORDER — HEPARIN SODIUM (PORCINE) 5000 UNIT/ML IJ SOLN: 5000.0000 [IU] | Freq: Three times a day (TID) | INTRAMUSCULAR | Status: DC

## 2015-01-16 MED ORDER — ONDANSETRON HCL 4 MG/2ML IJ SOLN
4.0000 mg | Freq: Once | INTRAMUSCULAR | Status: AC
  Administered 2015-01-16: 4 mg via INTRAVENOUS
  Filled 2015-01-16: qty 2

## 2015-01-16 MED ORDER — FENTANYL CITRATE (PF) 100 MCG/2ML IJ SOLN
INTRAMUSCULAR | Status: AC
  Filled 2015-01-16: qty 2

## 2015-01-16 MED ORDER — HYOSCYAMINE SULFATE 0.125 MG SL SUBL
0.2500 mg | SUBLINGUAL_TABLET | SUBLINGUAL | Status: DC | PRN
  Administered 2015-01-16: 0.25 mg via SUBLINGUAL
  Filled 2015-01-16: qty 2

## 2015-01-16 MED ORDER — MIDAZOLAM HCL 5 MG/5ML IJ SOLN
INTRAMUSCULAR | Status: AC
  Filled 2015-01-16: qty 5

## 2015-01-16 MED ORDER — MORPHINE SULFATE (PF) 4 MG/ML IV SOLN
4.0000 mg | Freq: Once | INTRAVENOUS | Status: AC
  Administered 2015-01-16: 4 mg via INTRAVENOUS
  Filled 2015-01-16: qty 1

## 2015-01-16 MED ORDER — DEXTROSE 5 % IV SOLN
1.0000 g | INTRAVENOUS | Status: DC
  Administered 2015-01-16: 1 g via INTRAVENOUS
  Filled 2015-01-16: qty 10

## 2015-01-16 MED ORDER — TAMSULOSIN HCL 0.4 MG PO CAPS: 0.4000 mg | ORAL_CAPSULE | Freq: Every day | ORAL | Status: DC

## 2015-01-16 MED ORDER — CEFUROXIME AXETIL 500 MG PO TABS: 500.0000 mg | ORAL_TABLET | Freq: Two times a day (BID) | ORAL | Status: DC

## 2015-01-16 MED ORDER — FENTANYL CITRATE (PF) 100 MCG/2ML IJ SOLN
INTRAMUSCULAR | Status: AC | PRN
  Administered 2015-01-16 (×3): 50 ug via INTRAVENOUS

## 2015-01-16 MED ORDER — TRIAMTERENE-HCTZ 37.5-25 MG PO CAPS
1.0000 | ORAL_CAPSULE | Freq: Every day | ORAL | Status: DC
  Filled 2015-01-16: qty 1

## 2015-01-16 MED ORDER — ONDANSETRON HCL 4 MG PO TABS: 4.0000 mg | ORAL_TABLET | Freq: Four times a day (QID) | ORAL | Status: DC | PRN

## 2015-01-16 MED ORDER — SODIUM CHLORIDE 0.9 % IV SOLN
INTRAVENOUS | Status: DC
  Administered 2015-01-16: 150 mL/h via INTRAVENOUS

## 2015-01-16 MED ORDER — OXYCODONE-ACETAMINOPHEN 5-325 MG PO TABS: 1.0000 | ORAL_TABLET | Freq: Four times a day (QID) | ORAL | Status: DC | PRN

## 2015-01-16 MED ORDER — TRIAMTERENE-HCTZ 37.5-25 MG PO CAPS: 1.0000 | ORAL_CAPSULE | Freq: Every day | ORAL | Status: DC

## 2015-01-16 MED ORDER — MORPHINE SULFATE (PF) 2 MG/ML IV SOLN
2.0000 mg | INTRAVENOUS | Status: DC | PRN
  Administered 2015-01-16: 08:00:00 2 mg via INTRAVENOUS
  Filled 2015-01-16: qty 1

## 2015-01-16 MED ORDER — ONDANSETRON HCL 4 MG/2ML IJ SOLN
INTRAMUSCULAR | Status: AC
  Filled 2015-01-16: qty 2

## 2015-01-16 MED ORDER — ONDANSETRON HCL 4 MG/2ML IJ SOLN: 4.0000 mg | Freq: Four times a day (QID) | INTRAMUSCULAR | Status: DC | PRN

## 2015-01-16 MED ORDER — PANTOPRAZOLE SODIUM 40 MG PO TBEC
40.0000 mg | DELAYED_RELEASE_TABLET | Freq: Every day | ORAL | Status: DC
Start: 2015-01-16 — End: 2015-01-16

## 2015-01-16 MED ORDER — MIDAZOLAM HCL 2 MG/2ML IJ SOLN
INTRAMUSCULAR | Status: AC | PRN
  Administered 2015-01-16 (×2): 1 mg via INTRAVENOUS
  Administered 2015-01-16: 09:00:00 2 mg via INTRAVENOUS
  Administered 2015-01-16: 10:00:00 1 mg via INTRAVENOUS

## 2015-01-16 MED ORDER — ACETAMINOPHEN 650 MG RE SUPP: 650.0000 mg | Freq: Four times a day (QID) | RECTAL | Status: DC | PRN

## 2015-01-16 MED ORDER — ONDANSETRON HCL 4 MG/2ML IJ SOLN
4.0000 mg | Freq: Once | INTRAMUSCULAR | Status: AC
  Administered 2015-01-16: 4 mg via INTRAVENOUS

## 2015-01-16 MED ORDER — TRIAMTERENE-HCTZ 37.5-25 MG PO TABS
1.0000 | ORAL_TABLET | Freq: Every day | ORAL | Status: DC
  Filled 2015-01-16: qty 1

## 2015-01-16 MED ORDER — DOCUSATE SODIUM 100 MG PO CAPS: 100.0000 mg | ORAL_CAPSULE | Freq: Two times a day (BID) | ORAL | Status: DC

## 2015-01-16 MED ORDER — ACETAMINOPHEN 325 MG PO TABS: 650.0000 mg | ORAL_TABLET | Freq: Four times a day (QID) | ORAL | Status: DC | PRN

## 2015-01-16 NOTE — ED Notes (Signed)
Patient returned from CT

## 2015-01-16 NOTE — ED Notes (Signed)
Unsuccessful foley placement x 2, one by MD.  MD is consulting Urology.

## 2015-01-16 NOTE — Procedures (Signed)
Successfully placement of CT guided suprapubic bladder catheter.  12 French drain placed without complication.  Minimal blood loss.  Urine sample sent for culture.

## 2015-01-16 NOTE — ED Notes (Signed)
urology at bedside

## 2015-01-16 NOTE — Progress Notes (Signed)
Patient ID: [name], male   DOB: 06/16/1945, 69 y.o.   MRN: [id number] 69 yo with urinary retention and history of suprapubic catheter.  Patient presented to ED with severe abdominal pain and Urology was unable to place a foley catheter.  Recent History and Physical has been reviewed.  Discussed placement of suprapubic catheter with patient.  Risks of the procedure were discussed and patient has a good understanding of the procedure.  Informed consent was obtained from patient and wife.  Plan for suprapubic catheter placement with CT guidance this morning with moderate sedation.

## 2015-01-16 NOTE — ED Notes (Signed)
Patient transported to CT 

## 2015-01-16 NOTE — Consult Note (Signed)
Consult: Urinary retention Requested by: Dr. WebsZenda Alpers Chief Complaint: Urinary retention  History of Present Illness: 69 year old obese white male who reports to the ED with inability to void. Patient had a weak stream and dysuria for a few days and inability to void at all since 6 PM or over the last 8 hours.  Patient is reporting significant bladder discomfort with urgency and inability to void. Bladder scan has been variable due to large pannus.    Patient has a history of urinary retention. He is obese, has a buried penis and phimosis. On patient history and review of the care everywhere notes Foley catheter placement and cystoscopy were not successful [facility] and he underwent suprapubic tube placement by IR October 2015 at the main campus. He then followed up with Dr. Lonna Cobb for what sounds like an exam under anesthesia and cystoscopy possible circumcision and per the patient "nothing was done". I did not see an op note and I'm not certain cystoscopy was successfully performed and a circ/dorsal slit might not be possible given large, firm suprapubic pannus/fat pad. The suprapubic tube was removed at some point. Patient has been seen in the emergency department here with frequency over the past few months with cultures growing mixed growth.  He has a temperature of 99, slight elevation of his white count 14 and slight elevation of the creatinine to 1.28. His baseline appears to be around 0.9 - 1.   Past Medical History  Diagnosis Date  . Cellulitis     foot  . GERD (gastroesophageal reflux disease)   . Hypertension   . Obstructive sleep apnea   . BPH (benign prostatic hypertrophy) with urinary retention   . Chronic bacterial prostatitis   . Morbid obesity with BMI of 50.0-59.9, adult (HCC)   . Stasis dermatitis of both legs   . H/O: upper GI bleed    Past Surgical History  Procedure Laterality Date  . Hip surgery      right  . Gastric bypass    . Cholecystectomy    . Colonoscopy   2006  . Pelvic examination under anesthesia cystourethroscopy / sigmoidoscopy  2015  . Hip replacement Right 2005    Home Medications: Care everywhere lists tamsulosin, Protonix, Maxzide.  (Not in a hospital admission) Allergies:  Allergies  Allergen Reactions  . Aspirin     Stomach issue    Family History  Problem Relation Age of Onset  . Diabetes Mother    Social History:  reports that he has never smoked. He does not have any smokeless tobacco history on file. He reports that he does not drink alcohol or use illicit drugs.  ROS: A complete review of systems was performed.  All systems are negative except for pertinent findings as noted. ROS   Physical Exam:  Vital signs in last 24 hours: Temp:  [99.1 F (37.3 C)] 99.1 F (37.3 C) (12/20 0023) Pulse Rate:  [92] 92 (12/20 0023) Resp:  [24] 24 (12/20 0023) BP: (131)/(64) 131/64 mmHg (12/20 0023) SpO2:  [97 %] 97 % (12/20 0023) Weight:  [163.295 kg (360 lb)] 163.295 kg (360 lb) (12/20 0023) General:  Alert and oriented, No acute distress.  HEENT: Normocephalic, atraumatic Lungs: Regular rate and effort Abdomen: Soft, nontender, nondistended, no abdominal masses; morbid obesity with a large pannus, then a secondary large, firm suprapubic pannus which buries the penis. Suprapubic tube scar above the secondary suprapubic pannus under the main pannus. Neurologic: Grossly intact GU: The penis is buried and because  the secondary suprapubic pannus is so large the glans cannot be exposed it can be palpated.  Procedure: With the assistance of 3 nurses and Trendelenburg, the opening to the penis was prepped and draped in the usual sterile fashion. We were able to get good exposure to the penile area. I tried to pass a 31 French catheter, a coud catheter and the Glidewire all of which were unsuccessful. I tried to guide a flexible cystoscope down in and this was also unsuccessful. There was a missing adapter for the light source,  which made visualization a bit difficult, but I was still not able to guide the cystoscope in. This was a combination of buried penis and what seems like phimosis. The suprapubic pannus is large and quite firm which prevents compression it in the typical fashion to get to the glans. Any advancement of the catheter seems to push the glans away despite working to palpate the glans and hold it in position through the skin.  Laboratory Data:  Results for orders placed or performed during the hospital encounter of 01/16/15 (from the past 24 hour(s))  CBC     Status: Abnormal   Collection Time: 01/16/15  1:34 AM  Result Value Ref Range   WBC 14.1 (H) 3.8 - 10.6 K/uL   RBC 4.32 (L) 4.40 - 5.90 MIL/uL   Hemoglobin 12.9 (L) 13.0 - 18.0 g/dL   HCT 09.8 (L) 11.9 - 14.7 %   MCV 91.6 80.0 - 100.0 fL   MCH 29.9 26.0 - 34.0 pg   MCHC 32.7 32.0 - 36.0 g/dL   RDW 82.9 56.2 - 13.0 %   Platelets 228 150 - 440 K/uL  Comprehensive metabolic panel     Status: Abnormal   Collection Time: 01/16/15  1:34 AM  Result Value Ref Range   Sodium 139 135 - 145 mmol/L   Potassium 4.0 3.5 - 5.1 mmol/L   Chloride 106 101 - 111 mmol/L   CO2 25 22 - 32 mmol/L   Glucose, Bld 135 (H) 65 - 99 mg/dL   BUN 29 (H) 6 - 20 mg/dL   Creatinine, Ser 8.65 (H) 0.61 - 1.24 mg/dL   Calcium 8.8 (L) 8.9 - 10.3 mg/dL   Total Protein 8.1 6.5 - 8.1 g/dL   Albumin 4.0 3.5 - 5.0 g/dL   AST 14 (L) 15 - 41 U/L   ALT 16 (L) 17 - 63 U/L   Alkaline Phosphatase 84 38 - 126 U/L   Total Bilirubin 0.8 0.3 - 1.2 mg/dL   GFR calc non Af Amer 56 (L) >60 mL/min   GFR calc Af Amer >60 >60 mL/min   Anion gap 8 5 - 15   No results found for this or any previous visit (from the past 240 hour(s)). Creatinine:  Recent Labs  01/16/15 0134  CREATININE 1.28*   CT a/p non-con - shows a distended bladder (I estimated about 700 ml) with fullness of R > L collecting system without hydro.   Impression/Assessment/plan: Urinary retention-during the  attempts at Catheterization urine wells up and spills out of his pannus. CT shows distended bladder. Will need SP tube with IR. I called and discussed with Dr. Lowella Dandy will be in the morning for IR. He will review patient first thing in the morning.   Keep NPO. I would recommend the patient be admitted to hospitalist for gentle hydration, IV antibiotics and pain meds to cover for urinary tract infection. Will place order for interventional radiology suprapubic tube placement  and hyoscyamine sublingual for bladder spasms. IV fluid hydration and further bladder distention will be uncomfortable but may make suprapubic tube placement safer and easier. Also, bladder moderately distended on imaging, not severe, so he could handle somefluids especially with slight bump in creatinine. I discussed plan with Dr. Zenda AlpersWebster and patient.   Ranie Chinchilla 01/16/2015, 2:43 AM

## 2015-01-16 NOTE — Discharge Summary (Signed)
[facility] - [place] [facility]   PATIENT NAME: [name]    MR#:  [id number]  DATE OF BIRTH:  1945/11/08  DATE OF ADMISSION:  01/16/2015 ADMITTING PHYSICIAN: Arnaldo Natal, MD  DATE OF DISCHARGE: 01/16/2015  PRIMARY CARE PHYSICIAN: Griffith Citron, MD    ADMISSION DIAGNOSIS:  Urinary retention [R33.9]  DISCHARGE DIAGNOSIS:  Active Problems:   Urinary retention   SECONDARY DIAGNOSIS:   Past Medical History  Diagnosis Date  . Cellulitis     foot  . GERD (gastroesophageal reflux disease)   . Hypertension   . Obstructive sleep apnea   . BPH (benign prostatic hypertrophy) with urinary retention   . Chronic bacterial prostatitis   . Morbid obesity with BMI of 50.0-59.9, adult (HCC)   . Stasis dermatitis of both legs   . H/O: upper GI bleed     HOSPITAL COURSE:   69 year old morbidly obese man with a history of hypertension and obstructive sleep apnea who presented with abdominal pain and found to have urinary retention. For further details please refer to H&P.  1. Urinary retention: Patient was seen and evaluated by urology. Due to body habitus it was recommended the patient have a suprapubic catheter placed. Interventional radiology placed a suprapubic catheter. He will need this likely lifelong. He will have a change of a suprapubic catheter in 4 weeks. Interventional radiology will scheduled appointment prior to discharge. Patient may have a urinary tract infection and as recommended by urology to prophylactically place the patient on 3-5 days of antibiotics. I will discharge him on Ceftin.  2. BPH: Continue Flomax.  3. Obstructive sleep apnea: Continue CPAP. 4. Essential hypertension: Continue triamterene and HCTZ.  I. Acute kidney injury: This is secondary to obstruction. BMP can be rechecked in one week at primary care physician.  6. Morbid obesity: Continue to encourage healthy diet and exercise.    DISCHARGE CONDITIONS AND  DIET:  Patient will be discharged home in stable condition on a heart healthy diet  CONSULTS OBTAINED:  Treatment Team:  Vanna Scotland, MD  DRUG ALLERGIES:   Allergies  Allergen Reactions  . Aspirin Other (See Comments)    Stomach bleed    DISCHARGE MEDICATIONS:   Current Discharge Medication List    START taking these medications   Details  cefUROXime (CEFTIN) 500 MG tablet Take 1 tablet (500 mg total) by mouth 2 (two) times daily with a meal. Qty: 12 tablet, Refills: 0    oxyCODONE-acetaminophen (ROXICET) 5-325 MG tablet Take 1 tablet by mouth every 6 (six) hours as needed for moderate pain or severe pain. Qty: 10 tablet, Refills: 0      CONTINUE these medications which have NOT CHANGED   Details  omeprazole (PRILOSEC) 20 MG capsule Take 20 mg by mouth daily.    tamsulosin (FLOMAX) 0.4 MG CAPS capsule Take 0.4 mg by mouth.    triamterene-hydrochlorothiazide (DYAZIDE) 37.5-25 MG per capsule Take 1 capsule by mouth daily.      STOP taking these medications     ciprofloxacin (CIPRO) 500 MG tablet      levofloxacin (LEVAQUIN) 500 MG tablet      mupirocin ointment (BACTROBAN) 2 %               Today   CHIEF COMPLAINT:  Patient is doing well. Patient feels much better after suprapubic catheter was placed.   VITAL SIGNS:  Blood pressure 106/52, pulse 56, temperature 97.8 F (36.6 C), temperature source Oral, resp. rate  21, height 5\' 10"  (1.778 m), weight 163.749 kg (361 lb), SpO2 98 %.   REVIEW OF SYSTEMS:  Review of Systems  Constitutional: Negative for fever, chills and malaise/fatigue.  HENT: Negative for sore throat.   Eyes: Negative for blurred vision.  Respiratory: Negative for cough, hemoptysis, shortness of breath and wheezing.   Cardiovascular: Negative for chest pain, palpitations and leg swelling.  Gastrointestinal: Negative for nausea, vomiting, abdominal pain, diarrhea and blood in stool.  Genitourinary: Negative for dysuria.   Musculoskeletal: Negative for back pain.  Neurological: Negative for dizziness, tremors and headaches.  Endo/Heme/Allergies: Does not bruise/bleed easily.     PHYSICAL EXAMINATION:  GENERAL:  69 y.o.-year-old patient lying in the bed with no acute distress.  NECK:  Supple, no jugular venous distention. No thyroid enlargement, no tenderness.  LUNGS: Normal breath sounds bilaterally, no wheezing, rales,rhonchi  No use of accessory muscles of respiration.  CARDIOVASCULAR: S1, S2 normal. No murmurs, rubs, or gallops.  ABDOMEN: Soft, non-tender, non-distended. Bowel sounds present. No organomegaly or mass.  EXTREMITIES: No pedal edema, cyanosis, or clubbing.  PSYCHIATRIC: The patient is alert and oriented x 3.  SKIN: No obvious rash, lesion, or ulcer.  Suprapubic catheter placed without purulent discharge DATA REVIEW:   CBC  Recent Labs Lab 01/16/15 0134  WBC 14.1*  HGB 12.9*  HCT 39.6*  PLT 228    Chemistries   Recent Labs Lab 01/16/15 0134  NA 139  K 4.0  CL 106  CO2 25  GLUCOSE 135*  BUN 29*  CREATININE 1.28*  CALCIUM 8.8*  AST 14*  ALT 16*  ALKPHOS 84  BILITOT 0.8    Cardiac Enzymes No results for input(s): TROPONINI in the last 168 hours.  Microbiology Results  @[email or web address] @  RADIOLOGY:  Ct Abdomen Pelvis Wo Contrast  01/16/2015  CLINICAL DATA:  69 year old male with abdominal pain. History of gastric bypass surgery. EXAM: CT ABDOMEN AND PELVIS WITHOUT CONTRAST TECHNIQUE: Multidetector CT imaging of the abdomen and pelvis was performed following the standard protocol without IV contrast. COMPARISON:  None. FINDINGS: Evaluation of this exam is limited in the absence of intravenous contrast. Evaluation of the pelvis is limited due to streak artifact caused by right hip arthroplasty. There are small scattered nodular densities at the right lung base measuring up to 4 mm, likely post inflammatory/infectious. Developing pneumonia is not excluded. Clinical  correlation is recommended. Focal right posterior pleural calcification, likely sequela of prior asbestos exposure. There is coronary vascular calcification. No intra-abdominal free air or free fluid identified. Cholecystectomy. The liver, pancreas, spleen, adrenal glands appear unremarkable. A 2 cm left renal inferior pole hypodense lesion is not characterized on this noncontrast CT. Ultrasound is recommended for further evaluation. There is no hydronephrosis or nephrolithiasis on either side. The urinary bladder appears unremarkable. The prostate gland is grossly unremarkable. There postsurgical changes of gastric bypass surgery. Evaluation of the bowel is limited in the absence of oral contrast. There is a mildly dilated air-filled segment of small bowel in the left upper abdomen anteriorly measuring 4.5 cm in diameter. There is abrupt caliber change at the adjacent anastomotic suture which may represent a degree of narrowing at the suture. Evaluation is however limited in the absence of oral contrast. No other bowel dilatation identified. No inflammatory changes noted. The appendix appears unremarkable. There is aortoiliac atherosclerotic disease. The abdominal aorta and IVC appear grossly unremarkable on this noncontrast study. No portal venous gas identified. There is no adenopathy. The abdominal wall soft tissues appear grossly  unremarkable. There is multilevel degenerative changes of the spine. Old healed right posterior rib fractures. 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 No acute fracture. IMPRESSION: No hydronephrosis or nephrolithiasis. Left renal inferior pole hypodense lesion, incompletely characterized. Ultrasound is recommended for further evaluation. Postsurgical changes of the bowel and gastric bypass. A short segment mildly dilated and air-filled loop of small bowel in the left upper abdomen proximal to an anastomotic suture may represent a degree of narrowing at the anastomosis. Clinical correlation  is recommended. Follow-up with CT with oral contrast or serial small bowel follow-through may provide additional information. Electronically Signed   By: Elgie Collard M.D.   On: 01/16/2015 03:53      Management plans discussed with the patient and he is in agreement. Stable for discharge  Patient should follow up with urology in 2 weeks PCP in one week interventional radiology in 4 weeks    CODE STATUS:     Code Status Orders        Start     Ordered   01/16/15 0744  Full code   Continuous     01/16/15 0743    Advance Directive Documentation        Most Recent Value   Type of Advance Directive  Healthcare Power of Attorney, Living will   Pre-existing out of facility DNR order (yellow form or pink MOST form)     "MOST" Form in Place?        TOTAL TIME TAKING CARE OF THIS PATIENT: 35 minutes.    Note: This dictation was prepared with Dragon dictation along with smaller phrase technology. Any transcriptional errors that result from this process are unintentional.  Shemekia Patane M.D on 01/16/2015 at 11:06 AM  Between 7am to 6pm - Pager - [phone] After 6pm go to [email or web address] - password [email or web address] [facility]  [facility]  Office  [phone]  CC: Primary care physician; Griffith Citron, MD

## 2015-01-16 NOTE — ED Notes (Signed)
Unable to get any results for bladder scan.

## 2015-01-16 NOTE — ED Notes (Addendum)
Patient presents reporting recurrent urinary retention. Last able to void at 1800. [facility] - had to have several different people attempt foley placement secondary to swelling and "scar tissue". Has has to have suprapubic in the past.

## 2015-01-16 NOTE — Progress Notes (Signed)
RT placed auto cpap in CT2 for patient's use during upcoming procedure.

## 2015-01-16 NOTE — H&P (Addendum)
[name] is an 69 y.o. male.   Chief Complaint: Abdominal pain HPI: Patient presents emergency department complaining of abdominal pain. He has been unable to consistently urinate for more than 24 hours now. He complains of colicky pain and reports foul-smelling urine as well as dysuria. He has a known phimosis of the penis. In the emergency department efforts to catheterized urinary bladder failed due to the patient's body habitus and phimosis. He was seen by urology who recommended interventional radiology to place a catheter. Due to presumed urinary tract infection as well as mild worsening of his renal function improved emergency department staff called the hospitalist service for admission  Past Medical History  Diagnosis Date  . Cellulitis     foot  . GERD (gastroesophageal reflux disease)   . Hypertension   . Obstructive sleep apnea   . BPH (benign prostatic hypertrophy) with urinary retention   . Chronic bacterial prostatitis   . Morbid obesity with BMI of 50.0-59.9, adult ([place])   . Stasis dermatitis of both legs   . H/O: upper GI bleed     Past Surgical History  Procedure Laterality Date  . Hip surgery      right  . Gastric bypass    . Cholecystectomy    . Colonoscopy  2006  . Pelvic examination under anesthesia cystourethroscopy / sigmoidoscopy  2015  . Hip replacement Right 2005    Family History  Problem Relation Age of Onset  . Diabetes Mother    Social History:  reports that he has never smoked. He does not have any smokeless tobacco history on file. He reports that he does not drink alcohol or use illicit drugs.  Allergies:  Allergies  Allergen Reactions  . Aspirin Other (See Comments)    Stomach bleed    Prior to Admission medications   Medication Sig Start Date End Date Taking? Authorizing Provider  omeprazole (PRILOSEC) 20 MG capsule Take 20 mg by mouth daily.   Yes Historical Provider, MD  tamsulosin (FLOMAX) 0.4 MG CAPS capsule Take 0.4 mg by  mouth.   Yes Historical Provider, MD  triamterene-hydrochlorothiazide (DYAZIDE) 37.5-25 MG per capsule Take 1 capsule by mouth daily.   Yes Historical Provider, MD  ciprofloxacin (CIPRO) 500 MG tablet Take 1 tablet (500 mg total) by mouth 2 (two) times daily. Patient not taking: Reported on 01/16/2015 12/10/14   Olen Cordial, NP  levofloxacin (LEVAQUIN) 500 MG tablet Take 1 tablet (500 mg total) by mouth daily. Patient not taking: Reported on 01/16/2015 12/25/14   Norval Gable, MD  mupirocin ointment Drue Stager) 2 % Apply to wound twice a day until healed Patient not taking: Reported on 01/16/2015 06/30/14   Olen Cordial, NP     Results for orders placed or performed during the hospital encounter of 01/16/15 (from the past 48 hour(s))  CBC     Status: Abnormal   Collection Time: 01/16/15  1:34 AM  Result Value Ref Range   WBC 14.1 (H) 3.8 - 10.6 K/uL   RBC 4.32 (L) 4.40 - 5.90 MIL/uL   Hemoglobin 12.9 (L) 13.0 - 18.0 g/dL   HCT 39.6 (L) 40.0 - 52.0 %   MCV 91.6 80.0 - 100.0 fL   MCH 29.9 26.0 - 34.0 pg   MCHC 32.7 32.0 - 36.0 g/dL   RDW 12.7 11.5 - 14.5 %   Platelets 228 150 - 440 K/uL  Comprehensive metabolic panel     Status: Abnormal   Collection Time: 01/16/15  1:34 AM  Result Value Ref Range   Sodium 139 135 - 145 mmol/L   Potassium 4.0 3.5 - 5.1 mmol/L   Chloride 106 101 - 111 mmol/L   CO2 25 22 - 32 mmol/L   Glucose, Bld 135 (H) 65 - 99 mg/dL   BUN 29 (H) 6 - 20 mg/dL   Creatinine, Ser 1.28 (H) 0.61 - 1.24 mg/dL   Calcium 8.8 (L) 8.9 - 10.3 mg/dL   Total Protein 8.1 6.5 - 8.1 g/dL   Albumin 4.0 3.5 - 5.0 g/dL   AST 14 (L) 15 - 41 U/L   ALT 16 (L) 17 - 63 U/L   Alkaline Phosphatase 84 38 - 126 U/L   Total Bilirubin 0.8 0.3 - 1.2 mg/dL   GFR calc non Af Amer 56 (L) >60 mL/min   GFR calc Af Amer >60 >60 mL/min    Comment: (NOTE) The eGFR has been calculated using the CKD EPI equation. This calculation has not been validated in all clinical situations. eGFR's  persistently <60 mL/min signify possible Chronic Kidney Disease.    Anion gap 8 5 - 15   Ct Abdomen Pelvis Wo Contrast  01/16/2015  CLINICAL DATA:  69 year old male with abdominal pain. History of gastric bypass surgery. EXAM: CT ABDOMEN AND PELVIS WITHOUT CONTRAST TECHNIQUE: Multidetector CT imaging of the abdomen and pelvis was performed following the standard protocol without IV contrast. COMPARISON:  None. FINDINGS: Evaluation of this exam is limited in the absence of intravenous contrast. Evaluation of the pelvis is limited due to streak artifact caused by right hip arthroplasty. There are small scattered nodular densities at the right lung base measuring up to 4 mm, likely post inflammatory/infectious. Developing pneumonia is not excluded. Clinical correlation is recommended. Focal right posterior pleural calcification, likely sequela of prior asbestos exposure. There is coronary vascular calcification. No intra-abdominal free air or free fluid identified. Cholecystectomy. The liver, pancreas, spleen, adrenal glands appear unremarkable. A 2 cm left renal inferior pole hypodense lesion is not characterized on this noncontrast CT. Ultrasound is recommended for further evaluation. There is no hydronephrosis or nephrolithiasis on either side. The urinary bladder appears unremarkable. The prostate gland is grossly unremarkable. There postsurgical changes of gastric bypass surgery. Evaluation of the bowel is limited in the absence of oral contrast. There is a mildly dilated air-filled segment of small bowel in the left upper abdomen anteriorly measuring 4.5 cm in diameter. There is abrupt caliber change at the adjacent anastomotic suture which may represent a degree of narrowing at the suture. Evaluation is however limited in the absence of oral contrast. No other bowel dilatation identified. No inflammatory changes noted. The appendix appears unremarkable. There is aortoiliac atherosclerotic disease. The  abdominal aorta and IVC appear grossly unremarkable on this noncontrast study. No portal venous gas identified. There is no adenopathy. The abdominal wall soft tissues appear grossly unremarkable. There is multilevel degenerative changes of the spine. Old healed right posterior rib fractures. 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 No acute fracture. IMPRESSION: No hydronephrosis or nephrolithiasis. Left renal inferior pole hypodense lesion, incompletely characterized. Ultrasound is recommended for further evaluation. Postsurgical changes of the bowel and gastric bypass. A short segment mildly dilated and air-filled loop of small bowel in the left upper abdomen proximal to an anastomotic suture may represent a degree of narrowing at the anastomosis. Clinical correlation is recommended. Follow-up with CT with oral contrast or serial small bowel follow-through may provide additional information. Electronically Signed   By: Milas Hock  Radparvar M.D.   On: 01/16/2015 03:53    Review of Systems  Constitutional: Negative for fever and chills.  HENT: Negative for sore throat and tinnitus.   Eyes: Negative for blurred vision and redness.  Respiratory: Negative for cough and shortness of breath.   Cardiovascular: Negative for chest pain, palpitations, orthopnea and PND.  Gastrointestinal: Positive for abdominal pain. Negative for nausea, vomiting and diarrhea.  Genitourinary: Negative for dysuria, urgency and frequency.  Musculoskeletal: Negative for myalgias and joint pain.  Skin: Negative for rash.       No lesions  Neurological: Negative for speech change, focal weakness and weakness.  Endo/Heme/Allergies: Does not bruise/bleed easily.       No temperature intolerance  Psychiatric/Behavioral: Negative for depression and suicidal ideas.    Blood pressure 122/53, pulse 63, temperature 99.1 F (37.3 C), temperature source Oral, resp. rate 17, height 5' 10" (1.778 m), weight 163.295 kg (360 lb), SpO2 96  %. Physical Exam  Nursing note and vitals reviewed. Constitutional: He is oriented to person, place, and time. He appears well-developed and well-nourished. He appears distressed.  HENT:  Head: Normocephalic and atraumatic.  Mouth/Throat: Oropharynx is clear and moist.  Eyes: Conjunctivae and EOM are normal. Pupils are equal, round, and reactive to light. No scleral icterus.  Neck: Normal range of motion. Neck supple. No JVD present. No tracheal deviation present. No thyromegaly present.  Cardiovascular: Normal rate, regular rhythm and normal heart sounds.  Exam reveals no gallop and no friction rub.   No murmur heard. Respiratory: Effort normal and breath sounds normal.  GI: Soft. Bowel sounds are normal. He exhibits no distension and no mass. There is tenderness. There is no rebound and no guarding.  Musculoskeletal: Normal range of motion. He exhibits no edema.  Lymphadenopathy:    He has no cervical adenopathy.  Neurological: He is alert and oriented to person, place, and time. No cranial nerve deficit.  Skin: Skin is warm and dry. No rash noted. No erythema.  Psychiatric: He has a normal mood and affect. His behavior is normal. Judgment and thought content normal.     Assessment/Plan This is a 69-year-old [name] admitted or presumed urinary tract infection secondary to urinary retention. 1. Urinary retention: Anatomical. Plan is for interventional radiology to place a urinary catheter. Manage pain. Continue tamsulosin 2. Urinary tract infection: Patient has urinary symptoms in addition to leukocytosis. Given her nares retention we are empirically treating for urinary tract infection. Continue ceftriaxone 3. Essential hypertension: Continue triamterene with hydrochlorothiazide 4. Acute kidney injury: Secondary to obstruction at the urethra. Continue to hydrate with intravenous fluid. Should resolve with catheterization of the bladder. 5. Morbid obesity: BMI is 51.8; encouraged  healthy diet and exercise 6. DVT prophylaxis: Heparin 7. GI prophylaxis: None The patient is a full code. Time spent on admission orders and patient care approximately 45 minutes  Diamond,  Michael S 01/16/2015, 6:52 AM    

## 2015-01-16 NOTE — ED Provider Notes (Signed)
[facility] Emergency Department Provider Note  ____________________________________________  Time seen: Approximately 101 AM  I have reviewed the triage vital signs and the nursing notes.   HISTORY  Chief Complaint Urinary Retention  History Limited by patient distress  HPI [name] is a 69 y.o. male who comes into the hospital today with difficulty urinating. The patient reports that he is always had problems urinating for the past 2-3 years. He reports that today he stopped being able to urinate around 6 PM. He reports that this typically occurs whenever he has a UTI. The patient reports that in the past when he's had this occur he's had to have a suprapubic catheter. The patient reports that for the past couple of days he had been dribbling but his ability to urinate stopped tonight. The patient has been going to urgent care to help treat the symptoms but has not been on any antibiotics.Patient also reports that he has scar tissue or prevent him from urinating. He has not had any fevers or any vomiting but he is in some severe pain. The patient rates his pain as a 10 out of 10 in intensity.  Past Medical History  Diagnosis Date  . Cellulitis     foot  . GERD (gastroesophageal reflux disease)   . Hypertension   . Obstructive sleep apnea   . BPH (benign prostatic hypertrophy) with urinary retention   . Chronic bacterial prostatitis   . Morbid obesity with BMI of 50.0-59.9, adult ([facility])   . Stasis dermatitis of both legs   . H/O: upper GI bleed     Patient Active Problem List   Diagnosis Date Noted  . Urinary retention 01/16/2015  . BPH (benign prostatic hypertrophy) with urinary retention   . Chronic bacterial prostatitis   . Morbid obesity with BMI of 50.0-59.9, adult ([facility])   . Stasis dermatitis of both legs   . H/O: upper GI bleed     Past Surgical History  Procedure Laterality Date  . Hip surgery      right  . Gastric bypass    .  Cholecystectomy    . Colonoscopy  2006  . Pelvic examination under anesthesia cystourethroscopy / sigmoidoscopy  2015  . Hip replacement Right 2005    No current outpatient prescriptions on file.  Allergies Aspirin  Family History  Problem Relation Age of Onset  . Diabetes Mother     Social History Social History  Substance Use Topics  . Smoking status: Never Smoker   . Smokeless tobacco: None  . Alcohol Use: No    Review of Systems Constitutional: No fever/chills Eyes: No visual changes. ENT: No sore throat. Cardiovascular: Denies chest pain. Respiratory: Denies shortness of breath. Gastrointestinal:  abdominal pain.  No nausea, no vomiting.  No diarrhea.  No constipation. Genitourinary: Urinary retention Musculoskeletal: Negative for back pain. Skin: Negative for rash. Neurological: Negative for headaches, focal weakness or numbness.  10-point ROS otherwise negative.  ____________________________________________   PHYSICAL EXAM:  VITAL SIGNS: ED Triage Vitals  Enc Vitals Group     BP 01/16/15 0023 131/64 mmHg     Pulse Rate 01/16/15 0023 92     Resp 01/16/15 0023 24     Temp 01/16/15 0023 99.1 F (37.3 C)     Temp Source 01/16/15 0023 Oral     SpO2 01/16/15 0023 97 %     Weight 01/16/15 0023 360 lb (163.295 kg)     Height 01/16/15 0023  (1.778  m)     Head Cir --      Peak Flow --      Pain Score 01/16/15 0025 10     Pain Loc --      Pain Edu? --      Excl. in GC? --     Constitutional: Alert and oriented. Well appearing and in severe distress. Eyes: Conjunctivae are normal. PERRL. EOMI. Head: Atraumatic. Nose: No congestion/rhinnorhea. Mouth/Throat: Mucous membranes are moist.  Oropharynx non-erythematous. Cardiovascular: Normal rate, regular rhythm. Grossly normal heart sounds.  Good peripheral circulation. Respiratory: Normal respiratory effort.  No retractions. Lungs CTAB. Gastrointestinal: Soft and obese with suprapubic tenderness to  palpation. No distention. Active bowel sounds Genitourinary: Foreskin swollen and penis is [name] and unable to be visualized. Patient is dribbling urine as we attempt to place a Foley catheter. Musculoskeletal: No lower extremity tenderness nor edema.  Neurologic:  Normal speech and language.  Skin:  Skin is warm, dry and intact. Erythematous rash noted to bilateral thighs Psychiatric: Mood and affect are normal. .  ____________________________________________   LABS (all labs ordered are listed, but only abnormal results are displayed)  Labs Reviewed  CBC - Abnormal; Notable for the following:    WBC 14.1 (*)    RBC 4.32 (*)    Hemoglobin 12.9 (*)    HCT 39.6 (*)    All other components within normal limits  COMPREHENSIVE METABOLIC PANEL - Abnormal; Notable for the following:    Glucose, Bld 135 (*)    BUN 29 (*)    Creatinine, Ser 1.28 (*)    Calcium 8.8 (*)    AST 14 (*)    ALT 16 (*)    GFR calc non Af Amer 56 (*)    All other components within normal limits  TSH  URINALYSIS COMPLETEWITH MICROSCOPIC ([facility] ONLY)  HEMOGLOBIN A1C   ____________________________________________  EKG  None ____________________________________________  RADIOLOGY  CT abdomen and pelvis: No hydronephrosis or nephrolithiasis, left inferior renal pole hypodense lesion in completely characterize. Ultrasound recommended further evaluation, postsurgical changes of the bowel and gastric bypass, short segment mildly dilated and air-filled loop of the small bowel in the left upper abdomen. ____________________________________________   PROCEDURES  Procedure(s) performed: None  Critical Care performed: No  ____________________________________________   INITIAL IMPRESSION / ASSESSMENT AND PLAN / ED COURSE  Pertinent labs & imaging results that were available during my care of the patient were reviewed by me and considered in my medical decision making (see chart for details).  The patient  is a 69 year old male who came in to the hospital with urinary retention. We attempted to place a Foley catheter using a normal Foley as well as a Coude catheter. The patient's foreskin is very swollen and we are unable to locate his meatus to place this catheter. After multiple unsuccessful attempts we contacted urology Dr. Mena GoesEskridge to come in an attempt to place the catheter. Dr. Mena GoesEskridge attempted to use a camera to locate the patient's meatus but was unable to do so successfully as well. He sent the patient for a CT scan to evaluate the amount of urine in the patient's bladder which she estimated to be around 700 ML's. He reports he contacted vascular interventional radiology to have them place a suprapubic catheter in the morning. He reports that the patient should go ahead and receive some gentle hydration as well as some IV antibiotics. He recommended admitting the patient to medicine and having the catheter placed in the morning. I contacted  medicine and the patient will be admitted. The patient did receive multiple doses of morphine as well as some ladder spasm medication. He also did have 2 episodes of bradycardia likely due to vasovagal episodes. The patient be admitted to the hospital. ____________________________________________   FINAL CLINICAL IMPRESSION(S) / ED DIAGNOSES  Final diagnoses:  Urinary retention      Rebecka Apley, MD 01/16/15 [id number]

## 2015-01-16 NOTE — ED Notes (Signed)
Dr. Mena GoesEskridge from [facility] at bedside.

## 2015-01-16 NOTE — Care Management Obs Status (Signed)
MEDICARE OBSERVATION STATUS NOTIFICATION   Patient Details  Name: Willette AlmaGlenn R Kunzman MRN: [id number] Date of Birth: 04/25/1945   Medicare Observation Status Notification Given:  Yes    Collie Siadngela Kimimila Tauzin, RN 01/16/2015, 8:49 AM

## 2015-01-16 NOTE — Progress Notes (Signed)
MD making rounds. Discharge orders received. Appointment Scheduled. IV removed. Prescription E-Prescribed to Pharmacy. Pain medicine prescription given to patient. Discharge paperwork provided, explained, signed and witnessed. Education handouts provided to patient. No unanswered questions. Escorted via wheelchair by Scientist, research (physical sciences)auxiliary staff. All belongings sent with patient and family.

## 2015-01-17 ENCOUNTER — Emergency Department
Admission: EM | Admit: 2015-01-17 | Discharge: 2015-01-17 | Disposition: A | Discharge status: Home / Self Care | Payer: Medicare Other | Attending: Emergency Medicine | Admitting: Emergency Medicine

## 2015-01-17 DIAGNOSIS — R300 Dysuria: Secondary | ICD-10-CM | POA: Insufficient documentation

## 2015-01-17 DIAGNOSIS — Z79899 Other long term (current) drug therapy: Secondary | ICD-10-CM | POA: Diagnosis not present

## 2015-01-17 DIAGNOSIS — I10 Essential (primary) hypertension: Secondary | ICD-10-CM | POA: Insufficient documentation

## 2015-01-17 DIAGNOSIS — N4889 Other specified disorders of penis: Secondary | ICD-10-CM | POA: Insufficient documentation

## 2015-01-17 DIAGNOSIS — R339 Retention of urine, unspecified: Secondary | ICD-10-CM | POA: Diagnosis present

## 2015-01-17 NOTE — ED Notes (Signed)
Pt states he had suprapubic catheter placed yesterday due to urinary retention.  States stopped draining into bag yest at 1700 and now having a lot of bladder spasms and pain.

## 2015-01-17 NOTE — ED Provider Notes (Signed)
[facility]  ____________________________________________  Time seen: 5:15 am  I have reviewed the triage vital signs and the nursing notes.   HISTORY  Chief Complaint Urinary Retention     HPI [name] is a 69 y.o. male presents with urinary retention and inability to urinate with no drainage from suprapubic catheter was placed one day ago. Patient states that he noted's abrupt stopping of drainage from the supra pubic catheter at 5:00 PM yesterday followed by inability to urinate.however the patient states that before presents to the emergency department he had an episode where he was able to urinate a large amount from his penis with improvement of pain. Patient stated that he noted blood in the back from the suprapubic catheter with small clots.  Past Medical History  Diagnosis Date  . Cellulitis     foot  . GERD (gastroesophageal reflux disease)   . Hypertension   . Obstructive sleep apnea   . BPH (benign prostatic hypertrophy) with urinary retention   . Chronic bacterial prostatitis   . Morbid obesity with BMI of 50.0-59.9, adult ([facility])   . Stasis dermatitis of both legs   . H/O: upper GI bleed     Patient Active Problem List   Diagnosis Date Noted  . Urinary retention 01/16/2015  . BPH (benign prostatic hypertrophy) with urinary retention   . Chronic bacterial prostatitis   . Morbid obesity with BMI of 50.0-59.9, adult ([facility])   . Stasis dermatitis of both legs   . H/O: upper GI bleed     Past Surgical History  Procedure Laterality Date  . Hip surgery      right  . Gastric bypass    . Cholecystectomy    . Colonoscopy  2006  . Pelvic examination under anesthesia cystourethroscopy / sigmoidoscopy  2015  . Hip replacement Right 2005    Current Outpatient Rx  Name  Route  Sig  Dispense  Refill  . cefUROXime (CEFTIN) 500 MG tablet   Oral   Take 1 tablet (500 mg total) by mouth 2 (two) times daily  with a meal.   12 tablet   0   . omeprazole (PRILOSEC) 20 MG capsule   Oral   Take 20 mg by mouth daily.         Marland Kitchen oxyCODONE-acetaminophen (ROXICET) 5-325 MG tablet   Oral   Take 1 tablet by mouth every 6 (six) hours as needed for moderate pain or severe pain.   10 tablet   0   . tamsulosin (FLOMAX) 0.4 MG CAPS capsule   Oral   Take 0.4 mg by mouth.         . triamterene-hydrochlorothiazide (DYAZIDE) 37.5-25 MG per capsule   Oral   Take 1 capsule by mouth daily.           Allergies Aspirin  Family History  Problem Relation Age of Onset  . Diabetes Mother     Social History Social History  Substance Use Topics  . Smoking status: Never Smoker   . Smokeless tobacco: Not on file  . Alcohol Use: No    Review of Systems  Constitutional: Negative for fever. Eyes: Negative for visual changes. ENT: Negative for sore throat. Cardiovascular: Negative for chest pain. Respiratory: Negative for shortness of breath. Gastrointestinal: Negative for abdominal pain, vomiting and diarrhea. Genitourinary: positive for inability to urinate Musculoskeletal: Negative for back pain. Skin: Negative for rash. Neurological: Negative for headaches, focal weakness or numbness.  10-point ROS otherwise negative.  ____________________________________________   PHYSICAL EXAM:  VITAL SIGNS: ED Triage Vitals  Enc Vitals Group     BP 01/17/15 0413 112/69 mmHg     Pulse Rate 01/17/15 0413 92     Resp 01/17/15 0413 18     Temp 01/17/15 0413 99.1 F (37.3 C)     Temp Source 01/17/15 0413 Oral     SpO2 01/17/15 0413 97 %     Weight 01/17/15 0413 360 lb (163.295 kg)     Height 01/17/15 0413 5\' 10"  (1.778 m)     Head Cir --      Peak Flow --      Pain Score 01/17/15 0414 2     Pain Loc --      Pain Edu? --      Excl. in GC? --      Constitutional: Alert and oriented. Well appearing and in no distress. Eyes: Conjunctivae are normal. PERRL. Normal extraocular  movements. ENT   Head: Normocephalic and atraumatic.   Nose: No congestion/rhinnorhea.   Mouth/Throat: Mucous membranes are moist.   Neck: No stridor. Hematological/Lymphatic/Immunilogical: No cervical lymphadenopathy. Cardiovascular: Normal rate, regular rhythm. Normal and symmetric distal pulses are present in all extremities. No murmurs, rubs, or gallops. Respiratory: Normal respiratory effort without tachypnea nor retractions. Breath sounds are clear and equal bilaterally. No wheezes/rales/rhonchi. Gastrointestinal: Soft and nontender. No distention. There is no CVA tenderness. Genitourinary: deferred Musculoskeletal: Nontender with normal range of motion in all extremities. No joint effusions.  No lower extremity tenderness nor edema. Neurologic:  Normal speech and language. No gross focal neurologic deficits are appreciated. Speech is normal.  Skin:  Skin is warm, dry and intact. No rash noted. Psychiatric: Mood and affect are normal. Speech and behavior are normal. Patient exhibits appropriate insight and judgment.    ____________________________________________   INITIAL IMPRESSION / ASSESSMENT AND PLAN / ED COURSE  Pertinent labs & imaging results that were available during my care of the patient were reviewed by me and considered in my medical decision making (see chart for details).  Patient's review the catheter was initially aspirated with 2 clots notedsubsequently catheter wasirrigated with 20 cc normal saline without meeting resistance.  ____________________________________________   FINAL CLINICAL IMPRESSION(S) / ED DIAGNOSES  Final diagnoses:  Urinary retention      Darci Currentandolph N Khaled Herda, MD 01/17/15 [phone]

## 2015-01-17 NOTE — ED Notes (Signed)
Catheter was flushed with approx 10 ml NS,   Flushed with ease, then Dr. Manson PasseyBrown aspirated and two small clots were aspirated.   Then per Dr Manson PasseyBrown catheter was irrigated with approx 60 mL of NS.   Blood tinged fluid is draining into the collection bag.

## 2015-01-17 NOTE — Discharge Instructions (Signed)
Acute Urinary Retention, Male °Acute urinary retention is the temporary inability to urinate. °This is a common problem in older men. As men age their prostates become larger and block the flow of urine from the bladder. This is usually a problem that has come on gradually.  °HOME CARE INSTRUCTIONS °If you are sent home with a Foley catheter and a drainage system, you will need to discuss the best course of action with your health care provider. While the catheter is in, maintain a good intake of fluids. Keep the drainage bag emptied and lower than your catheter. This is so that contaminated urine will not flow back into your bladder, which could lead to a urinary tract infection. °There are two main types of drainage bags. One is a large bag that usually is used at night. It has a good capacity that will allow you to sleep through the night without having to empty it. The second type is called a leg bag. It has a smaller capacity, so it needs to be emptied more frequently. However, the main advantage is that it can be attached by a leg strap and can go underneath your clothing, allowing you the freedom to move about or leave your home. °Only take over-the-counter or prescription medicines for pain, discomfort, or fever as directed by your health care provider.  °SEEK MEDICAL CARE IF: °· You develop a low-grade fever. °· You experience spasms or leakage of urine with the spasms. °SEEK IMMEDIATE MEDICAL CARE IF:  °· You develop chills or fever. °· Your catheter stops draining urine. °· Your catheter falls out. °· You start to develop increased bleeding that does not respond to rest and increased fluid intake. °MAKE SURE YOU: °· Understand these instructions. °· Will watch your condition. °· Will get help right away if you are not doing well or get worse. °  °This information is not intended to replace advice given to you by your health care provider. Make sure you discuss any questions you have with your health care  provider. °  °Document Released: 04/21/2000 Document Revised: 05/30/2014 Document Reviewed: 06/24/2012 °Elsevier Interactive Patient Education ©2016 [employer] Inc[employer] ° °

## 2015-01-17 NOTE — ED Notes (Signed)
30 ml of bloody fluid was emptied from catheter drainage bag,  Pt also reported that he was incontinent of urine just a few minutes ago when he was getting ready to get up to void, so the amount of urine is unknown.

## 2015-01-18 NOTE — ED Provider Notes (Signed)
CSN: [id number]     Arrival date & time 12/25/14  1539 History   First MD Initiated Contact with Patient 12/25/14 1706     Chief Complaint  Patient presents with  . Dysuria   (Consider location/radiation/quality/duration/timing/severity/associated sxs/prior Treatment) Patient is a 69 y.o. male presenting with dysuria.  Dysuria This is a new problem. The current episode started more than 2 days ago (3-4 days). The problem occurs constantly. The problem has been gradually worsening.    Past Medical History  Diagnosis Date  . Cellulitis     foot  . GERD (gastroesophageal reflux disease)   . Hypertension   . Obstructive sleep apnea   . BPH (benign prostatic hypertrophy) with urinary retention   . Chronic bacterial prostatitis   . Morbid obesity with BMI of 50.0-59.9, adult (HCC)   . Stasis dermatitis of both legs   . H/O: upper GI bleed    Past Surgical History  Procedure Laterality Date  . Hip surgery      right  . Gastric bypass    . Cholecystectomy    . Colonoscopy  2006  . Pelvic examination under anesthesia cystourethroscopy / sigmoidoscopy  2015  . Hip replacement Right 2005   Family History  Problem Relation Age of Onset  . Diabetes Mother    Social History  Substance Use Topics  . Smoking status: Never Smoker   . Smokeless tobacco: None  . Alcohol Use: No    Review of Systems  Genitourinary: Positive for dysuria.    Allergies  Aspirin  Home Medications   Prior to Admission medications   Medication Sig Start Date End Date Taking? Authorizing Provider  cefUROXime (CEFTIN) 500 MG tablet Take 1 tablet (500 mg total) by mouth 2 (two) times daily with a meal. 01/16/15   Adrian Saran, MD  omeprazole (PRILOSEC) 20 MG capsule Take 20 mg by mouth daily.    Historical Provider, MD  oxyCODONE-acetaminophen (ROXICET) 5-325 MG tablet Take 1 tablet by mouth every 6 (six) hours as needed for moderate pain or severe pain. 01/16/15   Adrian Saran, MD  tamsulosin (FLOMAX)  0.4 MG CAPS capsule Take 0.4 mg by mouth.    Historical Provider, MD  triamterene-hydrochlorothiazide (DYAZIDE) 37.5-25 MG per capsule Take 1 capsule by mouth daily.    Historical Provider, MD   Meds Ordered and Administered this Visit  Medications - No data to display  BP 137/73 mmHg  Pulse 68  Temp(Src) 98.2 F (36.8 C) (Tympanic)  Resp 17  Ht  (1.778 m)  Wt 363 lb (164.656 kg)  BMI 52.09 kg/m2  SpO2 99% No data found.   Physical Exam  Constitutional: He appears well-developed and well-nourished. No distress.  Abdominal: Soft. Bowel sounds are normal. He exhibits no distension and no mass. There is no tenderness. There is no rebound and no guarding.  Skin: No rash noted. He is not diaphoretic.  Nursing note and vitals reviewed.   ED Course  Procedures (including critical care time)  Labs Review Labs Reviewed  URINALYSIS COMPLETEWITH MICROSCOPIC ([facility] ONLY) - Abnormal; Notable for the following:    APPearance HAZY (*)    Hgb urine dipstick TRACE (*)    Leukocytes, UA 2+ (*)    Bacteria, UA FEW (*)    Squamous Epithelial / LPF 0-5 (*)    All other components within normal limits  URINE CULTURE    Imaging Review No results found.   Visual Acuity Review  Right Eye Distance:   Left  Eye Distance:   Bilateral Distance:    Right Eye Near:   Left Eye Near:    Bilateral Near:         MDM   1. UTI (lower urinary tract infection)    Discharge Medication List as of 12/25/2014  5:26 PM    START taking these medications   Details  levofloxacin (LEVAQUIN) 500 MG tablet Take 1 tablet (500 mg total) by mouth daily., Starting 12/25/2014, Until Discontinued, Normal       1. Labs/x-ray results and diagnosis reviewed with patient/parent/guardian/family 2. rx as per orders above; reviewed possible side effects, interactions, risks and benefits  3. Recommend supportive treatment with increased fluids 4. Follow-up prn if symptoms worsen or don't  improve    Payton Mccallumrlando Jearline Hirschhorn, MD 01/18/15 [phone]

## 2015-01-19 ENCOUNTER — Ambulatory Visit (INDEPENDENT_AMBULATORY_CARE_PROVIDER_SITE_OTHER): Payer: Medicare Other | Admitting: Urology

## 2015-01-19 ENCOUNTER — Other Ambulatory Visit: Payer: Self-pay

## 2015-01-19 ENCOUNTER — Encounter: Payer: Self-pay | Admitting: Urology

## 2015-01-19 VITALS — Ht 70.0 in | Wt 359.2 lb

## 2015-01-19 DIAGNOSIS — N400 Enlarged prostate without lower urinary tract symptoms: Secondary | ICD-10-CM

## 2015-01-19 DIAGNOSIS — R339 Retention of urine, unspecified: Secondary | ICD-10-CM

## 2015-01-19 DIAGNOSIS — N4883 Acquired buried penis: Secondary | ICD-10-CM

## 2015-01-19 NOTE — Progress Notes (Signed)
01/19/2015 11:09 AM   Willette AlmaGlenn R Dente 11/06/1945 [id number]  Referring provider: Griffith CitronWilliam U Heard, MD [address] [place], [place] [id number]  Chief Complaint  Patient presents with  . New Patient (Initial Visit)    HPI: 69 year old obese white male who reports to the ED with inability to void. Patient had a weak stream and dysuria for a few days and inability to void at all since 6 PM or over the last 8 hours.  Patient is reporting significant bladder discomfort with urgency and inability to void. Bladder scan has been variable due to large pannus.   Patient has a history of urinary retention. He is obese, has a buried penis and phimosis. On patient history and review of the care everywhere notes Foley catheter placement and cystoscopy were not successful [facility] and he underwent suprapubic tube placement by IR October 2015 at the main campus. He then followed up with Dr. Lonna CobbStoioff for what sounds like an exam under anesthesia and cystoscopy possible circumcision and per the patient "nothing was done". I did not see an op note and I'm not certain cystoscopy was successfully performed and a circ/dorsal slit might not be possible given large, firm suprapubic pannus/fat pad. The suprapubic tube was removed at some point. Patient has been seen in the emergency department here with frequency over the past few months with cultures growing mixed growth.  Interval History: In the interim, due to the patient's body habitus, he was unable to have a bedside cystoscopy with catheter placement. He was also able to review Dr. Heywood FootmanStoioff's operative report from November 2015. According to report, he tried to place a Foley catheter with multiple means including flexible cystoscopy and using a flexible ureteroscope. His glans was buried to deep in his pannus to navigate into his urethra. Dr. Mena GoesEskridge was also unable to navigate into his penile urethra due to his pannus bedside earlier this week.   PMH: Past  Medical History  Diagnosis Date  . Cellulitis     foot  . GERD (gastroesophageal reflux disease)   . Hypertension   . Obstructive sleep apnea   . BPH (benign prostatic hypertrophy) with urinary retention   . Chronic bacterial prostatitis   . Morbid obesity with BMI of 50.0-59.9, adult (HCC)   . Stasis dermatitis of both legs   . H/O: upper GI bleed   . Essential (primary) hypertension 02/21/2014  . Acid reflux 01/05/2013  . Stasis dermatitis of both legs     Surgical History: Past Surgical History  Procedure Laterality Date  . Hip surgery      right  . Gastric bypass    . Cholecystectomy    . Colonoscopy  2006  . Pelvic examination under anesthesia cystourethroscopy / sigmoidoscopy  2015  . Hip replacement Right 2005    Home Medications:    Medication List       This list is accurate as of: 01/19/15 11:09 AM.  Always use your most recent med list.               cefUROXime 500 MG tablet  Commonly known as:  CEFTIN  Take 1 tablet (500 mg total) by mouth 2 (two) times daily with a meal.     ferrous sulfate 325 (65 FE) MG tablet  Take by mouth.     MULTI-VITAMINS Tabs  Take by mouth.     omeprazole 20 MG capsule  Commonly known as:  PRILOSEC  Take 20 mg by mouth daily.  oxyCODONE-acetaminophen 5-325 MG tablet  Commonly known as:  ROXICET  Take 1 tablet by mouth every 6 (six) hours as needed for moderate pain or severe pain.     PROTONIX 40 MG tablet  Generic drug:  pantoprazole  Take 40 mg by mouth.     tamsulosin 0.4 MG Caps capsule  Commonly known as:  FLOMAX  Take 0.4 mg by mouth.     triamterene-hydrochlorothiazide 37.5-25 MG tablet  Commonly known as:  MAXZIDE-25  TK 1 T PO QAM     vitamin C 1000 MG tablet  Take by mouth.     Vitamin-B Complex Tabs  Take by mouth.        Allergies:  Allergies  Allergen Reactions  . Aspirin Other (See Comments)    Stomach bleed    Family History: Family History  Problem Relation Age of Onset    . Diabetes Mother   . Prostate cancer Neg Hx   . Bladder Cancer Neg Hx     Social History:  reports that he has never smoked. He does not have any smokeless tobacco history on file. He reports that he does not drink alcohol or use illicit drugs.  ROS: UROLOGY Frequent Urination?: Yes Hard to postpone urination?: Yes Burning/pain with urination?: Yes Get up at night to urinate?: Yes Leakage of urine?: Yes Urine stream starts and stops?: Yes Trouble starting stream?: Yes Do you have to strain to urinate?: Yes Blood in urine?: No Urinary tract infection?: Yes Sexually transmitted disease?: No Injury to kidneys or bladder?: No Painful intercourse?: No Weak stream?: Yes Erection problems?: No Penile pain?: No  Gastrointestinal Nausea?: No Vomiting?: No Indigestion/heartburn?: No Diarrhea?: No Constipation?: No  Constitutional Fever: No Night sweats?: No Weight loss?: No Fatigue?: No  Skin Skin rash/lesions?: No Itching?: No  Eyes Blurred vision?: No Double vision?: No  Ears/Nose/Throat Sore throat?: No Sinus problems?: No  Hematologic/Lymphatic Swollen glands?: No Easy bruising?: No  Cardiovascular Leg swelling?: No Chest pain?: No  Respiratory Cough?: No Shortness of breath?: No  Endocrine Excessive thirst?: No  Musculoskeletal Back pain?: No Joint pain?: No  Neurological Headaches?: No Dizziness?: No  Psychologic Depression?: No Anxiety?: No  Physical Exam: Ht  (1.778 m)  Wt 359 lb 3.2 oz (162.932 kg)  BMI 51.54 kg/m2  Constitutional:  Alert and oriented, No acute distress. HEENT: [place] AT, moist mucus membranes.  Trachea midline, no masses. Cardiovascular: No clubbing, cyanosis, or edema. Respiratory: Normal respiratory effort, no increased work of breathing. GI: Abdomen is soft, nontender, nondistended, no abdominal masses GU: No CVA tenderness. 12 fr SP tube in place. Draining clear pink urine. Skin: No rashes, bruises or  suspicious lesions. Lymph: No cervical or inguinal adenopathy. Neurologic: Grossly intact, no focal deficits, moving all 4 extremities. Psychiatric: Normal mood and affect.  Laboratory Data: Lab Results  Component Value Date   WBC 14.1* 01/16/2015   HGB 12.9* 01/16/2015   HCT 39.6* 01/16/2015   MCV 91.6 01/16/2015   PLT 228 01/16/2015    Lab Results  Component Value Date   CREATININE 1.28* 01/16/2015    No results found for: PSA  No results found for: TESTOSTERONE  Lab Results  Component Value Date   HGBA1C 5.1 01/16/2015    Urinalysis    Component Value Date/Time   COLORURINE YELLOW* 01/16/2015 1050   COLORURINE YELLOW 07/24/2013 1225   APPEARANCEUR CLOUDY* 01/16/2015 1050   APPEARANCEUR HAZY 07/24/2013 1225   LABSPEC 1.017 01/16/2015 1050   LABSPEC 1.020 07/24/2013  1225   PHURINE 6.0 01/16/2015 1050   PHURINE 6.0 07/24/2013 1225   GLUCOSEU NEGATIVE 01/16/2015 1050   GLUCOSEU NEGATIVE 07/24/2013 1225   HGBUR 2+* 01/16/2015 1050   HGBUR TRACE 07/24/2013 1225   BILIRUBINUR NEGATIVE 01/16/2015 1050   BILIRUBINUR NEGATIVE 07/24/2013 1225   KETONESUR NEGATIVE 01/16/2015 1050   KETONESUR NEGATIVE 07/24/2013 1225   PROTEINUR 30* 01/16/2015 1050   PROTEINUR NEGATIVE 07/24/2013 1225   NITRITE NEGATIVE 01/16/2015 1050   NITRITE NEGATIVE 07/24/2013 1225   LEUKOCYTESUR 3+* 01/16/2015 1050   LEUKOCYTESUR 1+ 07/24/2013 1225   Procedure: This 12 [place] SP tube was irrigated bedside with 30 cc of normal saline. There was clear return of fluid. There is no clots.  Assessment & Plan:    1. Urinary retention The patient has had all swabs as urinary retention isn't possible to place a Foley catheter in him due to body habitus. I think it best this time the patient continues to have a suprapubic tube for maximal urinary drainage. He has a small 12 French suprapubic tube in currently. I think the best for him if this is slowly upsized to reduce the risk of a blocking off. This  will need to be done by interventional radiology. We will arrange for this in the near future. He will follow-up in one month or sooner if needed.  2. Acquried buried penis As above  3. BPH  As above  Return in about 1 month (around 02/19/2015).  Hildred Laser, MD  [facility] [address], [address] [place], [place] [address] [phone]

## 2015-01-20 LAB — URINE CULTURE: Culture: >=100000

## 2015-01-23 ENCOUNTER — Other Ambulatory Visit: Payer: Self-pay | Admitting: Radiology

## 2015-01-23 ENCOUNTER — Other Ambulatory Visit (HOSPITAL_COMMUNITY): Payer: Self-pay | Admitting: Diagnostic Radiology

## 2015-01-23 ENCOUNTER — Telehealth: Payer: Self-pay | Admitting: Radiology

## 2015-01-23 DIAGNOSIS — R339 Retention of urine, unspecified: Secondary | ICD-10-CM

## 2015-01-23 NOTE — Addendum Note (Signed)
Addended by: Catalina LungerBUDZYN, Fischer Halley J on: 01/23/2015 03:24 PM   Modules accepted: Orders

## 2015-01-23 NOTE — Telephone Encounter (Signed)
Notified pt of suprapubic tube exchange scheduled on 02/09/15. Pt to [facility] 7:00[facility] & be npo after mn day of procedure. Pt voices understanding.  Pt states the suprapubic tube has not worked since it was placed on 01/16/15 but he is currently able to urinate normally.  Advised pt to call our office immediately if he is unable to urinate. Pt voices understanding.

## 2015-02-02 ENCOUNTER — Ambulatory Visit: Payer: Medicare Other

## 2015-02-09 ENCOUNTER — Ambulatory Visit
Admission: RE | Admit: 2015-02-09 | Discharge: 2015-02-09 | Disposition: A | Discharge status: Home / Self Care | Payer: Medicare Other | Source: Ambulatory Visit | Attending: Urology | Admitting: Urology

## 2015-02-09 ENCOUNTER — Ambulatory Visit: Admission: RE | Admit: 2015-02-09 | Payer: Medicare Other | Source: Ambulatory Visit

## 2015-02-09 ENCOUNTER — Ambulatory Visit
Admission: RE | Admit: 2015-02-09 | Discharge: 2015-02-09 | Disposition: A | Discharge status: Home / Self Care | Payer: Medicare Other | Source: Ambulatory Visit | Attending: Interventional Radiology | Admitting: Interventional Radiology

## 2015-02-09 DIAGNOSIS — Z4682 Encounter for fitting and adjustment of non-vascular catheter: Secondary | ICD-10-CM | POA: Diagnosis not present

## 2015-02-09 DIAGNOSIS — N400 Enlarged prostate without lower urinary tract symptoms: Secondary | ICD-10-CM | POA: Insufficient documentation

## 2015-02-09 DIAGNOSIS — R338 Other retention of urine: Secondary | ICD-10-CM

## 2015-02-09 DIAGNOSIS — N3289 Other specified disorders of bladder: Secondary | ICD-10-CM | POA: Diagnosis not present

## 2015-02-09 DIAGNOSIS — R339 Retention of urine, unspecified: Secondary | ICD-10-CM

## 2015-02-09 MED ORDER — IOHEXOL 300 MG/ML  SOLN
25.0000 mL | Freq: Once | INTRAMUSCULAR | Status: AC | PRN
  Administered 2015-02-09: 10 mL

## 2015-02-09 MED ORDER — SODIUM CHLORIDE 0.9 % IV SOLN
INTRAVENOUS | Status: DC
  Administered 2015-02-09: 08:00:00 via INTRAVENOUS

## 2015-02-09 MED ORDER — MIDAZOLAM HCL 5 MG/5ML IJ SOLN
INTRAMUSCULAR | Status: AC | PRN
  Administered 2015-02-09 (×2): 1 mg via INTRAVENOUS

## 2015-02-09 MED ORDER — LIDOCAINE HCL (PF) 1 % IJ SOLN
INTRAMUSCULAR | Status: DC | PRN
  Administered 2015-02-09: 10 mL

## 2015-02-09 MED ORDER — FENTANYL CITRATE (PF) 100 MCG/2ML IJ SOLN
INTRAMUSCULAR | Status: AC | PRN
  Administered 2015-02-09 (×2): 25 ug via INTRAVENOUS

## 2015-02-09 NOTE — Procedures (Signed)
Contrast injection of the existing suprapubic catheter raised concern for malpositioning which was confirmed with subsequent non-con pelvic CT. D/W referring Urologist, Dr. Sherryl BartersBudzyn, and as pt has been voiding normally, the decision was made NOT to replace the suprapubic catheter at this time.  Katherina RightJay Wilman Tucker, MD Pager #: [phone]

## 2015-02-09 NOTE — Progress Notes (Signed)
From VIR to CT.  Unable to check VS's during transfer and CT.  Pt. Had returned to baseline prior to going to CT. Tube removed.

## 2015-02-16 ENCOUNTER — Ambulatory Visit: Payer: Medicare Other

## 2015-02-23 ENCOUNTER — Encounter: Payer: Self-pay | Admitting: Urology

## 2015-02-23 ENCOUNTER — Ambulatory Visit (INDEPENDENT_AMBULATORY_CARE_PROVIDER_SITE_OTHER): Payer: Medicare Other | Admitting: Urology

## 2015-02-23 VITALS — BP 138/66 | HR 84 | Ht 70.0 in | Wt 363.7 lb

## 2015-02-23 DIAGNOSIS — R339 Retention of urine, unspecified: Secondary | ICD-10-CM

## 2015-02-23 DIAGNOSIS — N4883 Acquired buried penis: Secondary | ICD-10-CM

## 2015-02-23 DIAGNOSIS — N400 Enlarged prostate without lower urinary tract symptoms: Secondary | ICD-10-CM | POA: Diagnosis not present

## 2015-02-23 LAB — BLADDER SCAN AMB NON-IMAGING: Scan Result: 50

## 2015-02-23 LAB — URINALYSIS, COMPLETE
BILIRUBIN UA: NEGATIVE
Glucose, UA: NEGATIVE
KETONES UA: NEGATIVE
NITRITE UA: NEGATIVE
PH UA: 6 (ref 5.0–7.5)
Protein, UA: NEGATIVE
SPEC GRAV UA: 1.01 (ref 1.005–1.030)
UUROB: 0.2 mg/dL (ref 0.2–1.0)

## 2015-02-23 LAB — MICROSCOPIC EXAMINATION: RBC, UA: NONE SEEN /hpf (ref 0–?)

## 2015-02-23 NOTE — Progress Notes (Signed)
Bladder Scan °Patient void: 50 ml °Performed By: K. Galdino Hinchman,CMA °

## 2015-02-23 NOTE — Progress Notes (Signed)
02/23/2015 1:58 PM   [name] 12-07-1945 [id number]  Referring provider: Griffith Citron, MD [address] [place], [place] [address]  Chief Complaint  Patient presents with  . Follow-up    HPI: 70 year old obese white male who reports to the ED with inability to void. Patient had a weak stream and dysuria for a few days and inability to void at all since 6 PM or over the last 8 hours.  Patient is reporting significant bladder discomfort with urgency and inability to void. Bladder scan has been variable due to large pannus.   Patient has a history of urinary retention. He is obese, has a buried penis and phimosis. On patient history and review of the care everywhere notes Foley catheter placement and cystoscopy were not successful [facility] and he underwent suprapubic tube placement by IR October 2015 at the main campus. He then followed up with Dr. Lonna Cobb for what sounds like an exam under anesthesia and cystoscopy possible circumcision and per the patient "nothing was done". I did not see an op note and I'm not certain cystoscopy was successfully performed and a circ/dorsal slit might not be possible given large, firm suprapubic pannus/fat pad. The suprapubic tube was removed at some point. Patient has been seen in the emergency department here with frequency over the past few months with cultures growing mixed growth.  December 2016 Interval History: In the interim, due to the patient's body habitus, he was unable to have a bedside cystoscopy with catheter placement. He was also able to review Dr. Heywood Footman operative report from November 2015. According to report, he tried to place a Foley catheter with multiple means including flexible cystoscopy and using a flexible ureteroscope. His glans was buried to deep in his pannus to navigate into his urethra. Dr. Mena Goes was also unable to navigate into his penile urethra due to his pannus bedside earlier this week.    January 2017 Interval  History: The patient went to upsize his SP tube with interventional radiology a few weeks ago. At this time is noted that his catheter was no longer within his urinary bladder. His bladder was mostly decompressed on CT scan. He was voiding spontaneously at that point without issues. Reports that he has been voiding well since he was last in the office.. Today's 50 cc. He does like empties his bladder and is not having to strain.   PMH: Past Medical History  Diagnosis Date  . Cellulitis     foot  . GERD (gastroesophageal reflux disease)   . Hypertension   . BPH (benign prostatic hypertrophy) with urinary retention   . Chronic bacterial prostatitis   . Morbid obesity with BMI of 50.0-59.9, adult (HCC)   . Stasis dermatitis of both legs   . H/O: upper GI bleed   . Essential (primary) hypertension 02/21/2014  . Acid reflux 01/05/2013  . Stasis dermatitis of both legs   . Obstructive sleep apnea     uses C-Pap    Surgical History: Past Surgical History  Procedure Laterality Date  . Hip surgery      right  . Gastric bypass    . Cholecystectomy    . Colonoscopy  2006  . Pelvic examination under anesthesia cystourethroscopy / sigmoidoscopy  2015  . Hip replacement Right 2005    Home Medications:    Medication List       This list is accurate as of: 02/23/15  1:58 PM.  Always use your most recent med list.  ferrous sulfate 325 (65 FE) MG tablet  Take by mouth.     MULTI-VITAMINS Tabs  Take by mouth.     omeprazole 20 MG capsule  Commonly known as:  PRILOSEC  Take 20 mg by mouth daily.     oxyCODONE-acetaminophen 5-325 MG tablet  Commonly known as:  ROXICET  Take 1 tablet by mouth every 6 (six) hours as needed for moderate pain or severe pain.     PROTONIX 40 MG tablet  Generic drug:  pantoprazole  Take 40 mg by mouth.     tamsulosin 0.4 MG Caps capsule  Commonly known as:  FLOMAX  Take 0.4 mg by mouth.     triamterene-hydrochlorothiazide 37.5-25  MG tablet  Commonly known as:  MAXZIDE-25  TK 1 T PO QAM     vitamin C 1000 MG tablet  Take by mouth.     Vitamin-B Complex Tabs  Take by mouth.        Allergies:  Allergies  Allergen Reactions  . Aspirin Other (See Comments)    Stomach bleed    Family History: Family History  Problem Relation Age of Onset  . Diabetes Mother   . Prostate cancer Neg Hx   . Bladder Cancer Neg Hx     Social History:  reports that he has quit smoking. His smoking use included Cigarettes and Pipe. He does not have any smokeless tobacco history on file. He reports that he does not drink alcohol or use illicit drugs.  ROS: UROLOGY Frequent Urination?: No Hard to postpone urination?: No Burning/pain with urination?: No Get up at night to urinate?: Yes Leakage of urine?: No Urine stream starts and stops?: No Trouble starting stream?: Yes Do you have to strain to urinate?: No Blood in urine?: No Urinary tract infection?: No Sexually transmitted disease?: No Injury to kidneys or bladder?: No Painful intercourse?: No Weak stream?: No Erection problems?: No Penile pain?: No  Gastrointestinal Nausea?: No Vomiting?: No Indigestion/heartburn?: No Diarrhea?: No Constipation?: No  Constitutional Fever: No Night sweats?: No Weight loss?: No Fatigue?: No  Skin Skin rash/lesions?: No Itching?: No  Eyes Blurred vision?: No Double vision?: No  Ears/Nose/Throat Sore throat?: No Sinus problems?: No  Hematologic/Lymphatic Swollen glands?: No Easy bruising?: No  Cardiovascular Leg swelling?: No Chest pain?: No  Respiratory Cough?: No Shortness of breath?: No  Endocrine Excessive thirst?: No  Musculoskeletal Back pain?: No Joint pain?: No  Neurological Headaches?: No Dizziness?: No  Psychologic Depression?: No Anxiety?: No  Physical Exam: BP 138/66 mmHg  Pulse 84  Ht  (1.778 m)  Wt 363 lb 11.2 oz (164.973 kg)  BMI 52.19 kg/m2  Constitutional:  Alert  and oriented, No acute distress. HEENT: [place] AT, moist mucus membranes.  Trachea midline, no masses. Cardiovascular: No clubbing, cyanosis, or edema. Respiratory: Normal respiratory effort, no increased work of breathing. GI: Abdomen is soft, nontender, nondistended, no abdominal masses GU: No CVA tenderness.  Skin: No rashes, bruises or suspicious lesions. Lymph: No cervical or inguinal adenopathy. Neurologic: Grossly intact, no focal deficits, moving all 4 extremities. Psychiatric: Normal mood and affect.  Laboratory Data: Lab Results  Component Value Date   WBC 14.1* 01/16/2015   HGB 12.9* 01/16/2015   HCT 39.6* 01/16/2015   MCV 91.6 01/16/2015   PLT 228 01/16/2015    Lab Results  Component Value Date   CREATININE 1.28* 01/16/2015    No results found for: PSA  No results found for: TESTOSTERONE  Lab Results  Component Value Date  HGBA1C 5.1 01/16/2015    Urinalysis    Component Value Date/Time   COLORURINE YELLOW* 01/16/2015 1050   COLORURINE YELLOW 07/24/2013 1225   APPEARANCEUR CLOUDY* 01/16/2015 1050   APPEARANCEUR HAZY 07/24/2013 1225   LABSPEC 1.017 01/16/2015 1050   LABSPEC 1.020 07/24/2013 1225   PHURINE 6.0 01/16/2015 1050   PHURINE 6.0 07/24/2013 1225   GLUCOSEU NEGATIVE 01/16/2015 1050   GLUCOSEU NEGATIVE 07/24/2013 1225   HGBUR 2+* 01/16/2015 1050   HGBUR TRACE 07/24/2013 1225   BILIRUBINUR NEGATIVE 01/16/2015 1050   BILIRUBINUR NEGATIVE 07/24/2013 1225   KETONESUR NEGATIVE 01/16/2015 1050   KETONESUR NEGATIVE 07/24/2013 1225   PROTEINUR 30* 01/16/2015 1050   PROTEINUR NEGATIVE 07/24/2013 1225   NITRITE NEGATIVE 01/16/2015 1050   NITRITE NEGATIVE 07/24/2013 1225   LEUKOCYTESUR 3+* 01/16/2015 1050   LEUKOCYTESUR 1+ 07/24/2013 1225     Assessment & Plan:    1. Urinary retention Appears to have resolved. If the patient again develops urinary retention the future which is certainly at risk for, he will need to have replacement of a suprapubic  catheter by interventional radiology as his body habitus makes Foley catheter placement impossible. He was instructed to call the office he feels like he is developing urinary retention again and we will check a PVR that this is the case. He does understand that if his PVR is elevated he will still need to the emergency room for interventional radiology to see him for SP tube placement  2. BPH  -continue flomax  2. Acquried buried penis As above  Return in about 6 months (around 08/23/2015).  Hildred Laser, MD  [facility] [address], [address] [place], [place] [address] [phone]

## 2015-03-28 ENCOUNTER — Emergency Department
Admission: EM | Admit: 2015-03-28 | Discharge: 2015-03-28 | Disposition: A | Discharge status: Home / Self Care | Payer: Medicare Other | Attending: Emergency Medicine | Admitting: Emergency Medicine

## 2015-03-28 DIAGNOSIS — Z87891 Personal history of nicotine dependence: Secondary | ICD-10-CM | POA: Insufficient documentation

## 2015-03-28 DIAGNOSIS — N390 Urinary tract infection, site not specified: Secondary | ICD-10-CM | POA: Diagnosis not present

## 2015-03-28 DIAGNOSIS — I10 Essential (primary) hypertension: Secondary | ICD-10-CM | POA: Insufficient documentation

## 2015-03-28 DIAGNOSIS — Z79899 Other long term (current) drug therapy: Secondary | ICD-10-CM | POA: Diagnosis not present

## 2015-03-28 DIAGNOSIS — R339 Retention of urine, unspecified: Secondary | ICD-10-CM | POA: Diagnosis present

## 2015-03-28 LAB — BASIC METABOLIC PANEL
ANION GAP: 9 (ref 5–15)
BUN: 20 mg/dL (ref 6–20)
CHLORIDE: 104 mmol/L (ref 101–111)
CO2: 27 mmol/L (ref 22–32)
Calcium: 8.8 mg/dL — ABNORMAL LOW (ref 8.9–10.3)
Creatinine, Ser: 1.1 mg/dL (ref 0.61–1.24)
GFR calc Af Amer: >60 mL/min (ref >60)
GFR calc non Af Amer: >60 mL/min (ref >60)
GLUCOSE: 109 mg/dL — AB (ref 65–99)
POTASSIUM: 3.9 mmol/L (ref 3.5–5.1)
Sodium: 140 mmol/L (ref 135–145)

## 2015-03-28 LAB — URINALYSIS COMPLETE WITH MICROSCOPIC (ARMC ONLY)
BACTERIA UA: NONE SEEN
Bilirubin Urine: NEGATIVE
GLUCOSE, UA: NEGATIVE mg/dL
Hgb urine dipstick: NEGATIVE
Ketones, ur: NEGATIVE mg/dL
NITRITE: NEGATIVE
Protein, ur: NEGATIVE mg/dL
SPECIFIC GRAVITY, URINE: 1.013 (ref 1.005–1.030)
pH: 6 (ref 5.0–8.0)

## 2015-03-28 LAB — CBC WITH DIFFERENTIAL/PLATELET
BASOS ABS: 0.1 10*3/uL (ref 0–0.1)
Basophils Relative: 1 %
EOS PCT: 2 %
Eosinophils Absolute: 0.2 10*3/uL (ref 0–0.7)
HEMATOCRIT: 38.2 % — AB (ref 40.0–52.0)
HEMOGLOBIN: 13.1 g/dL (ref 13.0–18.0)
LYMPHS ABS: 1.3 10*3/uL (ref 1.0–3.6)
LYMPHS PCT: 17 %
MCH: 30.5 pg (ref 26.0–34.0)
MCHC: 34.2 g/dL (ref 32.0–36.0)
MCV: 89.2 fL (ref 80.0–100.0)
Monocytes Absolute: 0.7 10*3/uL (ref 0.2–1.0)
Monocytes Relative: 9 %
NEUTROS ABS: 5.5 10*3/uL (ref 1.4–6.5)
NEUTROS PCT: 71 %
Platelets: 186 10*3/uL (ref 150–440)
RBC: 4.28 MIL/uL — AB (ref 4.40–5.90)
RDW: 13.2 % (ref 11.5–14.5)
WBC: 7.8 10*3/uL (ref 3.8–10.6)

## 2015-03-28 MED ORDER — CEPHALEXIN 500 MG PO CAPS: 500.0000 mg | ORAL_CAPSULE | Freq: Three times a day (TID) | ORAL | Status: DC

## 2015-03-28 MED ORDER — CEPHALEXIN 500 MG PO CAPS
500.0000 mg | ORAL_CAPSULE | Freq: Once | ORAL | Status: AC
  Administered 2015-03-28: 500 mg via ORAL

## 2015-03-28 MED ORDER — CEPHALEXIN 500 MG PO CAPS
ORAL_CAPSULE | ORAL | Status: AC
  Filled 2015-03-28: qty 1

## 2015-03-28 NOTE — ED Notes (Signed)
Bladder scan results 21 mL per Mellody Dance, Medic

## 2015-03-28 NOTE — ED Notes (Signed)
Pt here for inability to void. Reports last time he urinated about around 9 am.  Reports feeling the urge but is unable to void.

## 2015-03-28 NOTE — Discharge Instructions (Signed)

## 2015-03-28 NOTE — ED Provider Notes (Signed)
[facility] Emergency Department Provider Note  ____________________________________________  Time seen: Approximately 510 PM  I have reviewed the triage vital signs and the nursing notes.   HISTORY  Chief Complaint Urinary Retention    HPI [name] BRITTIAN is a 70 y.o. male with an enlarged prostate and previous episodes of urinary retention is presenting this morning with urinary retention. He says he has not urinated since 9 AM and was feeling pressure to his lower abdomen. He says that he began to urinate while he was waiting in the waiting room and now he is pain-free. He says that he is likely able to urinate at this time and will give a urine sample. He says he has had multiple episodes of urinary retention in the past and he thinks it is related to urinary tract infection. He denies any burning or pain with urination. No new medications. Does take Flomax home. Has been seen by Dr.Budzen, of urology. 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 Our, he has no catheters at this time.   Past Medical History  Diagnosis Date  . Cellulitis     foot  . GERD (gastroesophageal reflux disease)   . Hypertension   . BPH (benign prostatic hypertrophy) with urinary retention   . Chronic bacterial prostatitis   . Morbid obesity with BMI of 50.0-59.9, adult (HCC)   . Stasis dermatitis of both legs   . H/O: upper GI bleed   . Essential (primary) hypertension 02/21/2014  . Acid reflux 01/05/2013  . Stasis dermatitis of both legs   . Obstructive sleep apnea     uses C-Pap    Patient Active Problem List   Diagnosis Date Noted  . Urinary retention 01/16/2015  . Pain in shoulder 11/28/2014  . Arthralgia of hip 11/28/2014  . BPH (benign prostatic hypertrophy) with urinary retention   . Chronic bacterial prostatitis   . Morbid obesity with BMI of 50.0-59.9, adult (HCC)   . Stasis  dermatitis of both legs   . H/O: upper GI bleed   . Essential (primary) hypertension 02/21/2014  . 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 Bladder retention 12/07/2013  . Benign prostatic hyperplasia with urinary obstruction 10/24/2013  . Body mass index (BMI) of 50-59.9 in adult (HCC) 10/11/2013  . Lower urinary tract infection 09/14/2013  . Acid reflux 01/05/2013  . History of cholecystectomy 01/05/2013  . Adiposity 11/12/2012  . History of surgical procedure 11/12/2012  . Apnea, sleep 06/14/2012    Past Surgical History  Procedure Laterality Date  . Hip surgery      right  . Gastric bypass    . Cholecystectomy    . Colonoscopy  2006  . Pelvic examination under anesthesia cystourethroscopy / sigmoidoscopy  2015  . Hip replacement Right 2005    Current Outpatient Rx  Name  Route  Sig  Dispense  Refill  . Ascorbic Acid (VITAMIN C) 1000 MG tablet   Oral   Take by mouth.         . B Complex Vitamins (VITAMIN-B COMPLEX) TABS   Oral   Take by mouth.         . ferrous sulfate 325 (65 FE) MG tablet   Oral   Take by mouth.         . Multiple Vitamin (MULTI-VITAMINS) TABS   Oral   Take by mouth.         Marland Kitchen  omeprazole (PRILOSEC) 20 MG capsule   Oral   Take 20 mg by mouth daily.         Marland Kitchen oxyCODONE-acetaminophen (ROXICET) 5-325 MG tablet   Oral   Take 1 tablet by mouth every 6 (six) hours as needed for moderate pain or severe pain. Patient not taking: Reported on 02/23/2015   10 tablet   0   . pantoprazole (PROTONIX) 40 MG tablet   Oral   Take 40 mg by mouth.         . tamsulosin (FLOMAX) 0.4 MG CAPS capsule   Oral   Take 0.4 mg by mouth.         . triamterene-hydrochlorothiazide (MAXZIDE-25) 37.5-25 MG tablet      TK 1 T PO QAM      3     Allergies Aspirin  Family History  Problem Relation Age of Onset  . Diabetes Mother   . Prostate cancer Neg Hx   . Bladder Cancer Neg Hx     Social History Social History  Substance Use Topics  .  Smoking status: Former Smoker    Types: Cigarettes, Pipe  . Smokeless tobacco: Not on file  . Alcohol Use: No    Review of Systems Constitutional: No fever/chills Eyes: No visual changes. ENT: No sore throat. Cardiovascular: Denies chest pain. Respiratory: Denies shortness of breath. Gastrointestinal: No abdominal pain.  No nausea, no vomiting.  No diarrhea.  No constipation. Genitourinary: Negative for dysuria. Musculoskeletal: Negative for back pain. Skin: Negative for rash. Neurological: Negative for headaches, focal weakness or numbness.  10-point ROS otherwise negative.  ____________________________________________   PHYSICAL EXAM:  VITAL SIGNS: ED Triage Vitals  Enc Vitals Group     BP 03/28/15 1535 134/83 mmHg     Pulse Rate 03/28/15 1535 91     Resp 03/28/15 1535 18     Temp 03/28/15 1535 97.8 F (36.6 C)     Temp Source 03/28/15 1535 Oral     SpO2 03/28/15 1535 97 %     Weight --      Height --      Head Cir --      Peak Flow --      Pain Score 03/28/15 1536 8     Pain Loc --      Pain Edu? --      Excl. in GC? --     Constitutional: Alert and oriented. Well appearing and in no acute distress. Morbidly obese. Eyes: Conjunctivae are normal. PERRL. EOMI. Head: Atraumatic. Nose: No congestion/rhinnorhea. Mouth/Throat: Mucous membranes are moist.  Oropharynx non-erythematous. Neck: No stridor.   Cardiovascular: Normal rate, regular rhythm. Grossly normal heart sounds.  Good peripheral circulation. Respiratory: Normal respiratory effort.  No retractions. Lungs CTAB. Gastrointestinal: Soft and nontender. No distention.  No CVA tenderness. Genitourinary:  Normal external exam without any masses. No tenderness palpation to the testicles. Musculoskeletal: No lower extremity tenderness nor edema.  No joint effusions. Neurologic:  Normal speech and language. No gross focal neurologic deficits are appreciated. No gait instability. Skin:  Skin is warm, dry and  intact. No rash noted. Psychiatric: Mood and affect are normal. Speech and behavior are normal.  ____________________________________________   LABS (all labs ordered are listed, but only abnormal results are displayed)  Labs Reviewed  URINALYSIS COMPLETEWITH MICROSCOPIC ([facility] ONLY) - Abnormal; Notable for the following:    Color, Urine AMBER (*)    APPearance CLOUDY (*)    Leukocytes, UA 3+ (*)  Squamous Epithelial / LPF 0-5 (*)    All other components within normal limits  CBC WITH DIFFERENTIAL/PLATELET - Abnormal; Notable for the following:    RBC 4.28 (*)    HCT 38.2 (*)    All other components within normal limits  BASIC METABOLIC PANEL - Abnormal; Notable for the following:    Glucose, Bld 109 (*)    Calcium 8.8 (*)    All other components within normal limits   ____________________________________________  EKG   ____________________________________________  RADIOLOGY   ____________________________________________   PROCEDURES  ____________________________________________   INITIAL IMPRESSION / ASSESSMENT AND PLAN / ED COURSE  Pertinent labs & imaging results that were available during my care of the patient were reviewed by me and considered in my medical decision making (see chart for details).  ----------------------------------------- 6:18 PM on 03/28/2015 -----------------------------------------  Patient resting comfortably at this time. Appears to have urinary tract infection from his lab results. It may be that he is not retaining at all but he has a sense of urgency secondary to the UTI. We'll send culture. Residual postvoid was 21 ML's. He'll be following up with his urologist. ____________________________________________   FINAL CLINICAL IMPRESSION(S) / ED DIAGNOSES  Urinary tract infection.    Myrna Blazer, MD 03/28/15 [phone]

## 2015-03-30 LAB — URINE CULTURE

## 2015-04-05 ENCOUNTER — Ambulatory Visit (INDEPENDENT_AMBULATORY_CARE_PROVIDER_SITE_OTHER): Payer: Medicare Other | Admitting: Urology

## 2015-04-05 ENCOUNTER — Encounter: Payer: Self-pay | Admitting: Urology

## 2015-04-05 VITALS — BP 132/78 | HR 88 | Ht 70.0 in | Wt 357.0 lb

## 2015-04-05 DIAGNOSIS — R339 Retention of urine, unspecified: Secondary | ICD-10-CM | POA: Diagnosis not present

## 2015-04-05 LAB — URINALYSIS, COMPLETE
Bilirubin, UA: NEGATIVE
GLUCOSE, UA: NEGATIVE
Ketones, UA: NEGATIVE
NITRITE UA: NEGATIVE
Protein, UA: NEGATIVE
RBC, UA: NEGATIVE
Specific Gravity, UA: 1.015 (ref 1.005–1.030)
UUROB: 0.2 mg/dL (ref 0.2–1.0)
pH, UA: 5.5 (ref 5.0–7.5)

## 2015-04-05 LAB — MICROSCOPIC EXAMINATION: Bacteria, UA: NONE SEEN

## 2015-04-05 NOTE — Progress Notes (Signed)
Bladder Scan Patient  void: 43 ml Performed By: Theotis BurrowK.Russell,CMA

## 2015-04-05 NOTE — Progress Notes (Signed)
04/05/2015 3:59 PM   [name] March 16, 1945 [id number]  Referring provider: Delton Prairie, MD [address] [facility] [place], [place] [address]  Chief Complaint  Patient presents with  . Urinary Retention    ER f/u    HPI: 70 year old obese white male who reports to the ED with inability to void. Patient had a weak stream and dysuria for a few days and inability to void at all since 6 PM or over the last 8 hours.  Patient is reporting significant bladder discomfort with urgency and inability to void. Bladder scan has been variable due to large pannus.   Patient has a history of urinary retention. He is obese, has a buried penis and phimosis. On patient history and review of the care everywhere notes Foley catheter placement and cystoscopy were not successful [facility] and he underwent suprapubic tube placement by IR October 2015 at the main campus. He then followed up with Dr. Lonna Cobb for what sounds like an exam under anesthesia and cystoscopy possible circumcision and per the patient "nothing was done". I did not see an op note and I'm not certain cystoscopy was successfully performed and a circ/dorsal slit might not be possible given large, firm suprapubic pannus/fat pad. The suprapubic tube was removed at some point. Patient has been seen in the emergency department here with frequency over the past few months with cultures growing mixed growth.  December 2016 Interval History: In the interim, due to the patient's body habitus, he was unable to have a bedside cystoscopy with catheter placement. He was also able to review Dr. Heywood Footman operative report from November 2015. According to report, he tried to place a Foley catheter with multiple means including flexible cystoscopy and using a flexible ureteroscope. His glans was buried to deep in his pannus to navigate into his urethra. Dr. Mena Goes was also unable to navigate into his penile urethra due to his pannus bedside earlier  this week.   January 2017 Interval History: The patient went to upsize his SP tube with interventional radiology a few weeks ago. At this time is noted that his catheter was no longer within his urinary bladder. His bladder was mostly decompressed on CT scan. He was voiding spontaneously at that point without issues. Reports that he has been voiding well since he was last in the office.. Today's 50 cc. He does like empties his bladder and is not having to strain.  March 2017 interval history: In the last week, the patient reported the ER unable to void but will wait for the ER physician he voids spontaneous bleed. His PVR was 21. Urine culture was contaminated. He was however treated with Keflex for urinary tract infection which he felt improved his symptoms. Now he is voiding back at baseline. He is empty his bladder today.  PVR today:43  PMH: Past Medical History  Diagnosis Date  . Cellulitis     foot  . GERD (gastroesophageal reflux disease)   . Hypertension   . BPH (benign prostatic hypertrophy) with urinary retention   . Chronic bacterial prostatitis   . Morbid obesity with BMI of 50.0-59.9, adult (HCC)   . Stasis dermatitis of both legs   . H/O: upper GI bleed   . Essential (primary) hypertension 02/21/2014  . Acid reflux 01/05/2013  . Stasis dermatitis of both legs   . Obstructive sleep apnea     uses C-Pap    Surgical History: Past Surgical History  Procedure Laterality Date  . Hip  surgery      right  . Gastric bypass    . Cholecystectomy    . Colonoscopy  2006  . Pelvic examination under anesthesia cystourethroscopy / sigmoidoscopy  2015  . Hip replacement Right 2005    Home Medications:    Medication List       This list is accurate as of: 04/05/15  3:59 PM.  Always use your most recent med list.               ferrous sulfate 325 (65 FE) MG tablet  Take by mouth.     MULTI-VITAMINS Tabs  Take by mouth.     omeprazole 20 MG capsule  Commonly known as:   PRILOSEC  Take 20 mg by mouth daily.     oxyCODONE-acetaminophen 5-325 MG tablet  Commonly known as:  ROXICET  Take 1 tablet by mouth every 6 (six) hours as needed for moderate pain or severe pain.     PROTONIX 40 MG tablet  Generic drug:  pantoprazole  Take 40 mg by mouth.     tamsulosin 0.4 MG Caps capsule  Commonly known as:  FLOMAX  Take 0.4 mg by mouth.     triamterene-hydrochlorothiazide 37.5-25 MG tablet  Commonly known as:  MAXZIDE-25  TK 1 T PO QAM     vitamin C 1000 MG tablet  Take by mouth.     Vitamin-B Complex Tabs  Take by mouth.        Allergies:  Allergies  Allergen Reactions  . Aspirin Other (See Comments)    Stomach bleed    Family History: Family History  Problem Relation Age of Onset  . Diabetes Mother   . Prostate cancer Neg Hx   . Bladder Cancer Neg Hx     Social History:  reports that he has quit smoking. His smoking use included Cigarettes and Pipe. He does not have any smokeless tobacco history on file. He reports that he does not drink alcohol or use illicit drugs.  ROS: UROLOGY Frequent Urination?: Yes Hard to postpone urination?: No Burning/pain with urination?: No Get up at night to urinate?: Yes Leakage of urine?: No Urine stream starts and stops?: No Trouble starting stream?: No Do you have to strain to urinate?: No Blood in urine?: No Urinary tract infection?: No Sexually transmitted disease?: No Injury to kidneys or bladder?: No Painful intercourse?: No Weak stream?: No Erection problems?: No Penile pain?: No  Gastrointestinal Nausea?: No Vomiting?: No Indigestion/heartburn?: No Diarrhea?: No Constipation?: No  Constitutional Fever: No Night sweats?: No Weight loss?: No Fatigue?: No  Skin Skin rash/lesions?: No Itching?: No  Eyes Blurred vision?: No Double vision?: No  Ears/Nose/Throat Sore throat?: No Sinus problems?: No  Hematologic/Lymphatic Swollen glands?: No Easy bruising?:  No  Cardiovascular Leg swelling?: No Chest pain?: No  Respiratory Cough?: No Shortness of breath?: No  Endocrine Excessive thirst?: No  Musculoskeletal Back pain?: No Joint pain?: No  Neurological Headaches?: No Dizziness?: No  Psychologic Depression?: No Anxiety?: No  Physical Exam: BP 132/78 mmHg  Pulse 88  Ht 5\' 10"  (1.778 m)  Wt 357 lb (161.934 kg)  BMI 51.22 kg/m2  Constitutional:  Alert and oriented, No acute distress. HEENT: [place] AT, moist mucus membranes.  Trachea midline, no masses. Cardiovascular: No clubbing, cyanosis, or edema. Respiratory: Normal respiratory effort, no increased work of breathing. GI: Abdomen is soft, nontender, nondistended, no abdominal masses GU: No CVA tenderness.  Skin: No rashes, bruises or suspicious lesions. Lymph: No cervical or  inguinal adenopathy. Neurologic: Grossly intact, no focal deficits, moving all 4 extremities. Psychiatric: Normal mood and affect.  Laboratory Data: Lab Results  Component Value Date   WBC 7.8 03/28/2015   HGB 13.1 03/28/2015   HCT 38.2* 03/28/2015   MCV 89.2 03/28/2015   PLT 186 03/28/2015    Lab Results  Component Value Date   CREATININE 1.10 03/28/2015    No results found for: PSA  No results found for: TESTOSTERONE  Lab Results  Component Value Date   HGBA1C 5.1 01/16/2015    Urinalysis    Component Value Date/Time   COLORURINE AMBER* 03/28/2015 1734   COLORURINE YELLOW 07/24/2013 1225   APPEARANCEUR CLOUDY* 03/28/2015 1734   APPEARANCEUR HAZY 07/24/2013 1225   LABSPEC 1.013 03/28/2015 1734   LABSPEC 1.020 07/24/2013 1225   PHURINE 6.0 03/28/2015 1734   PHURINE 6.0 07/24/2013 1225   GLUCOSEU NEGATIVE 03/28/2015 1734   GLUCOSEU NEGATIVE 07/24/2013 1225   HGBUR NEGATIVE 03/28/2015 1734   HGBUR TRACE 07/24/2013 1225   BILIRUBINUR NEGATIVE 03/28/2015 1734   BILIRUBINUR Negative 02/23/2015 1343   BILIRUBINUR NEGATIVE 07/24/2013 1225   KETONESUR NEGATIVE 03/28/2015 1734    KETONESUR NEGATIVE 07/24/2013 1225   PROTEINUR NEGATIVE 03/28/2015 1734   PROTEINUR NEGATIVE 07/24/2013 1225   NITRITE NEGATIVE 03/28/2015 1734   NITRITE Negative 02/23/2015 1343   NITRITE NEGATIVE 07/24/2013 1225   LEUKOCYTESUR 3+* 03/28/2015 1734   LEUKOCYTESUR 2+* 02/23/2015 1343   LEUKOCYTESUR 1+ 07/24/2013 1225    Assessment & Plan:    1. History of Urinary retention Appears to have resolved. If the patient again develops urinary retention the future which he is certainly at risk for, he will need to have replacement of a suprapubic catheter by interventional radiology as his body habitus makes Foley catheter placement impossible. He was instructed to call the office if he feels like he is developing urinary retention again and we will check a PVR. He does understand that if his PVR is elevated he will still need to the emergency room for interventional radiology to see him for SP tube placement  2. BPH  -continue flomax  2. Acquried buried penis As above  Return in about 6 months (around 10/06/2015).  Hildred Laser, MD  [facility] [address], [address] [place], [place] [address] [phone]

## 2015-04-17 ENCOUNTER — Other Ambulatory Visit: Payer: Medicare Other

## 2015-04-17 DIAGNOSIS — N390 Urinary tract infection, site not specified: Secondary | ICD-10-CM

## 2015-04-17 LAB — URINALYSIS, COMPLETE
BILIRUBIN UA: NEGATIVE
Glucose, UA: NEGATIVE
KETONES UA: NEGATIVE
Nitrite, UA: NEGATIVE
PH UA: 5 (ref 5.0–7.5)
PROTEIN UA: NEGATIVE
Specific Gravity, UA: 1.02 (ref 1.005–1.030)
UUROB: 0.2 mg/dL (ref 0.2–1.0)

## 2015-04-17 LAB — MICROSCOPIC EXAMINATION: RBC, UA: NONE SEEN /hpf (ref 0–?)

## 2015-04-18 ENCOUNTER — Emergency Department
Admission: EM | Admit: 2015-04-18 | Discharge: 2015-04-18 | Disposition: A | Discharge status: Home / Self Care | Payer: Medicare Other | Attending: Student | Admitting: Student

## 2015-04-18 ENCOUNTER — Telehealth: Payer: Self-pay | Admitting: Urology

## 2015-04-18 DIAGNOSIS — Z79899 Other long term (current) drug therapy: Secondary | ICD-10-CM | POA: Diagnosis not present

## 2015-04-18 DIAGNOSIS — Z792 Long term (current) use of antibiotics: Secondary | ICD-10-CM | POA: Insufficient documentation

## 2015-04-18 DIAGNOSIS — I10 Essential (primary) hypertension: Secondary | ICD-10-CM | POA: Diagnosis not present

## 2015-04-18 DIAGNOSIS — Z87891 Personal history of nicotine dependence: Secondary | ICD-10-CM | POA: Diagnosis not present

## 2015-04-18 DIAGNOSIS — N3001 Acute cystitis with hematuria: Secondary | ICD-10-CM | POA: Insufficient documentation

## 2015-04-18 DIAGNOSIS — R300 Dysuria: Secondary | ICD-10-CM | POA: Diagnosis present

## 2015-04-18 LAB — URINALYSIS COMPLETE WITH MICROSCOPIC (ARMC ONLY)
BILIRUBIN URINE: NEGATIVE
GLUCOSE, UA: NEGATIVE mg/dL
HGB URINE DIPSTICK: NEGATIVE
KETONES UR: NEGATIVE mg/dL
Nitrite: NEGATIVE
Protein, ur: NEGATIVE mg/dL
Specific Gravity, Urine: 1.012 (ref 1.005–1.030)
pH: 5 (ref 5.0–8.0)

## 2015-04-18 MED ORDER — CEPHALEXIN 500 MG PO CAPS: 500.0000 mg | ORAL_CAPSULE | Freq: Two times a day (BID) | ORAL | Status: DC

## 2015-04-18 NOTE — ED Notes (Signed)
Pt reports dysuria since Monday; left urine sample at urologist on Tuesday but has not been called with any results; denies abd or back pain; denies any hematuria; st tx for UTI approx month ago with keflex with relief of symptoms

## 2015-04-18 NOTE — Discharge Instructions (Signed)
Urinary Tract Infection Urinary tract infections (UTIs) can develop anywhere along your urinary tract. Your urinary tract is your body's drainage system for removing wastes and extra water. Your urinary tract includes two kidneys, two ureters, a bladder, and a urethra. Your kidneys are a pair of bean-shaped organs. Each kidney is about the size of your fist. They are located below your ribs, one on each side of your spine. CAUSES Infections are caused by microbes, which are microscopic organisms, including fungi, viruses, and bacteria. These organisms are so small that they can only be seen through a microscope. Bacteria are the microbes that most commonly cause UTIs. SYMPTOMS  Symptoms of UTIs may vary by age and gender of the patient and by the location of the infection. Symptoms in young women typically include a frequent and intense urge to urinate and a painful, burning feeling in the bladder or urethra during urination. Older women and men are more likely to be tired, shaky, and weak and have muscle aches and abdominal pain. A fever may mean the infection is in your kidneys. Other symptoms of a kidney infection include pain in your back or sides below the ribs, nausea, and vomiting. DIAGNOSIS To diagnose a UTI, your caregiver will ask you about your symptoms. Your caregiver will also ask you to provide a urine sample. The urine sample will be tested for bacteria and white blood cells. White blood cells are made by your body to help fight infection. TREATMENT  Typically, UTIs can be treated with medication. Because most UTIs are caused by a bacterial infection, they usually can be treated with the use of antibiotics. The choice of antibiotic and length of treatment depend on your symptoms and the type of bacteria causing your infection. HOME CARE INSTRUCTIONS  If you were prescribed antibiotics, take them exactly as your caregiver instructs you. Finish the medication even if you feel better after  you have only taken some of the medication.  Drink enough water and fluids to keep your urine clear or pale yellow.  Avoid caffeine, tea, and carbonated beverages. They tend to irritate your bladder.  Empty your bladder often. Avoid holding urine for long periods of time.  Empty your bladder before and after sexual intercourse.  After a bowel movement, women should cleanse from front to back. Use each tissue only once. SEEK MEDICAL CARE IF:   You have back pain.  You develop a fever.  Your symptoms do not begin to resolve within 3 days. SEEK IMMEDIATE MEDICAL CARE IF:   You have severe back pain or lower abdominal pain.  You develop chills.  You have nausea or vomiting.  You have continued burning or discomfort with urination. MAKE SURE YOU:   Understand these instructions.  Will watch your condition.  Will get help right away if you are not doing well or get worse.   This information is not intended to replace advice given to you by your health care provider. Make sure you discuss any questions you have with your health care provider.   Document Released: 10/23/2004 Document Revised: 10/04/2014 Document Reviewed: 02/21/2011 Elsevier Interactive Patient Education 2016 [employer]   Take the prescription antibiotic as directed. You may receive a call regarding your culture results tomorrow. Follow-up with your urologist as planned.

## 2015-04-18 NOTE — ED Provider Notes (Signed)
[facility] Emergency Department Provider Note ____________________________________________  Time seen: 2130  I have reviewed the triage vital signs and the nursing notes.  HISTORY  Chief Complaint  Dysuria  HPI [name] is a 70 y.o. male presents to the ED for evaluation of symptoms of dysuria with onset Monday, 2 days prior to arrival. He was recently seen by his urologist but has not been called back regarding urinalysis or urine culture results.The patient denies any urinary retention, frank hematuria, fevers, chills, or sweats. He was treated about a month prior with Keflex for an acute UTI reported resolution of symptoms with that medication.  Past Medical History  Diagnosis Date  . Cellulitis     foot  . GERD (gastroesophageal reflux disease)   . Hypertension   . BPH (benign prostatic hypertrophy) with urinary retention   . Chronic bacterial prostatitis   . Morbid obesity with BMI of 50.0-59.9, adult (HCC)   . Stasis dermatitis of both legs   . H/O: upper GI bleed   . Essential (primary) hypertension 02/21/2014  . Acid reflux 01/05/2013  . Stasis dermatitis of both legs   . Obstructive sleep apnea     uses C-Pap    Patient Active Problem List   Diagnosis Date Noted  . Urinary retention 01/16/2015  . Pain in shoulder 11/28/2014  . Arthralgia of hip 11/28/2014  . BPH (benign prostatic hypertrophy) with urinary retention   . Chronic bacterial prostatitis   . Morbid obesity with BMI of 50.0-59.9, adult (HCC)   . Stasis dermatitis of both legs   . H/O: upper GI bleed   . Essential (primary) hypertension 02/21/2014  . 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 Bladder retention 12/07/2013  . Benign prostatic hyperplasia with urinary obstruction 10/24/2013  . Body mass index (BMI) of 50-59.9 in adult (HCC) 10/11/2013  . Lower urinary tract infection 09/14/2013  . Acid reflux 01/05/2013  . History of cholecystectomy 01/05/2013  . Adiposity  11/12/2012  . History of surgical procedure 11/12/2012  . Apnea, sleep 06/14/2012    Past Surgical History  Procedure Laterality Date  . Hip surgery      right  . Gastric bypass    . Cholecystectomy    . Colonoscopy  2006  . Pelvic examination under anesthesia cystourethroscopy / sigmoidoscopy  2015  . Hip replacement Right 2005    Current Outpatient Rx  Name  Route  Sig  Dispense  Refill  . Ascorbic Acid (VITAMIN C) 1000 MG tablet   Oral   Take by mouth.         . B Complex Vitamins (VITAMIN-B COMPLEX) TABS   Oral   Take by mouth.         . cephALEXin (KEFLEX) 500 MG capsule   Oral   Take 1 capsule (500 mg total) by mouth 2 (two) times daily.   20 capsule   0   . ferrous sulfate 325 (65 FE) MG tablet   Oral   Take by mouth.         . Multiple Vitamin (MULTI-VITAMINS) TABS   Oral   Take by mouth.         Marland Kitchen. omeprazole (PRILOSEC) 20 MG capsule   Oral   Take 20 mg by mouth daily.         Marland Kitchen. oxyCODONE-acetaminophen (ROXICET) 5-325 MG tablet   Oral   Take 1 tablet by mouth every 6 (six) hours as needed for moderate pain or severe pain. Patient  not taking: Reported on 02/23/2015   10 tablet   0   . pantoprazole (PROTONIX) 40 MG tablet   Oral   Take 40 mg by mouth.         . tamsulosin (FLOMAX) 0.4 MG CAPS capsule   Oral   Take 0.4 mg by mouth.         . triamterene-hydrochlorothiazide (MAXZIDE-25) 37.5-25 MG tablet      TK 1 T PO QAM      3     Allergies Aspirin  Family History  Problem Relation Age of Onset  . Diabetes Mother   . Prostate cancer Neg Hx   . Bladder Cancer Neg Hx     Social History Social History  Substance Use Topics  . Smoking status: Former Smoker    Types: Cigarettes, Pipe  . Smokeless tobacco: Not on file  . Alcohol Use: No    Review of Systems  Constitutional: Negative for fever. Cardiovascular: Negative for chest pain. Respiratory: Negative for shortness of breath. Gastrointestinal: Negative for  abdominal pain, vomiting and diarrhea. Genitourinary: Positive for dysuria. Musculoskeletal: Negative for back pain. Skin: Negative for rash. Neurological: Negative for headaches, focal weakness or numbness. ____________________________________________  PHYSICAL EXAM:  VITAL SIGNS: ED Triage Vitals  Enc Vitals Group     BP 04/18/15 2013 145/63 mmHg     Pulse Rate 04/18/15 2013 71     Resp 04/18/15 2013 20     Temp 04/18/15 2013 97.8 F (36.6 C)     Temp Source 04/18/15 2013 Oral     SpO2 04/18/15 2013 96 %     Weight 04/18/15 2013 357 lb (161.934 kg)     Height 04/18/15 2013  (1.778 m)     Head Cir --      Peak Flow --      Pain Score --      Pain Loc --      Pain Edu? --      Excl. in GC? --    Constitutional: Alert and oriented. Well appearing and in no distress. Head: Normocephalic and atraumatic. Eyes: Conjunctivae are normal. PERRL. Normal extraocular movements Cardiovascular: Normal rate, regular rhythm.  Respiratory: Normal respiratory effort. No wheezes/rales/rhonchi. Gastrointestinal: Soft and nontender. No distention. Musculoskeletal: Nontender with normal range of motion in all extremities.  Neurologic:  Normal gait without ataxia. Normal speech and language. No gross focal neurologic deficits are appreciated. Skin:  Skin is warm, dry and intact. No rash noted. Psychiatric: Mood and affect are normal. Patient exhibits appropriate insight and judgment. ____________________________________________   LABS (pertinent positives/negatives) Labs Reviewed  URINALYSIS COMPLETEWITH MICROSCOPIC ([facility] ONLY) - Abnormal; Notable for the following:    Color, Urine YELLOW (*)    APPearance HAZY (*)    Leukocytes, UA 3+ (*)    Bacteria, UA RARE (*)    Squamous Epithelial / LPF 0-5 (*)    All other components within normal limits  URINE CULTURE  ____________________________________________  INITIAL IMPRESSION / ASSESSMENT AND PLAN / ED COURSE  Patient laboratory  confirmation of an acute UTI. He was discharged with a prescription for Keflex as requested. He will follow with his urologist as planned. Urine culture is pending at the time of discharge. ____________________________________________  FINAL CLINICAL IMPRESSION(S) / ED DIAGNOSES  Final diagnoses:  Acute cystitis with hematuria      Lissa Hoard, PA-C 04/18/15 1610  Gayla Doss, MD 04/19/15 0006

## 2015-04-18 NOTE — Telephone Encounter (Signed)
Pt called and was in yesterday to give urine sample.  He hasn't heard anything.  He is scheduled for shoulder surgery next week and is afraid if he has an infection and it isn't cleared up, it will delay his surgery.  Please give pt a call A.S.A.P.

## 2015-04-18 NOTE — Telephone Encounter (Signed)
Pt called requesting results of urine culture collected 04/17/15. Advised pt that it would take at least 3 days to get results. Pt stated Urgent Care would give him antibiotics if he had an infection. It was explained to pt that antibiotics would be ordered once it was determined that he does have an infection & it was known what medication would treat the infection. He then demanded to be notified immediately when results are in.

## 2015-04-19 LAB — CULTURE, URINE COMPREHENSIVE

## 2015-04-20 LAB — URINE CULTURE: Special Requests: NORMAL

## 2015-04-21 ENCOUNTER — Emergency Department: Payer: Medicare Other

## 2015-04-21 ENCOUNTER — Encounter: Payer: Self-pay | Admitting: Emergency Medicine

## 2015-04-21 ENCOUNTER — Emergency Department
Admission: EM | Admit: 2015-04-21 | Discharge: 2015-04-22 | Disposition: A | Discharge status: Home / Self Care | Payer: Medicare Other | Attending: Emergency Medicine | Admitting: Emergency Medicine

## 2015-04-21 DIAGNOSIS — Z79899 Other long term (current) drug therapy: Secondary | ICD-10-CM | POA: Insufficient documentation

## 2015-04-21 DIAGNOSIS — N4883 Acquired buried penis: Secondary | ICD-10-CM | POA: Insufficient documentation

## 2015-04-21 DIAGNOSIS — I10 Essential (primary) hypertension: Secondary | ICD-10-CM | POA: Diagnosis not present

## 2015-04-21 DIAGNOSIS — Z6841 Body Mass Index (BMI) 40.0 and over, adult: Secondary | ICD-10-CM | POA: Diagnosis not present

## 2015-04-21 DIAGNOSIS — R339 Retention of urine, unspecified: Secondary | ICD-10-CM | POA: Insufficient documentation

## 2015-04-21 DIAGNOSIS — K219 Gastro-esophageal reflux disease without esophagitis: Secondary | ICD-10-CM | POA: Insufficient documentation

## 2015-04-21 DIAGNOSIS — N400 Enlarged prostate without lower urinary tract symptoms: Secondary | ICD-10-CM | POA: Diagnosis not present

## 2015-04-21 DIAGNOSIS — Z87891 Personal history of nicotine dependence: Secondary | ICD-10-CM | POA: Diagnosis not present

## 2015-04-21 DIAGNOSIS — Z96641 Presence of right artificial hip joint: Secondary | ICD-10-CM | POA: Diagnosis not present

## 2015-04-21 DIAGNOSIS — R39198 Other difficulties with micturition: Secondary | ICD-10-CM

## 2015-04-21 LAB — BASIC METABOLIC PANEL
ANION GAP: 6 (ref 5–15)
BUN: 21 mg/dL — ABNORMAL HIGH (ref 6–20)
CO2: 27 mmol/L (ref 22–32)
Calcium: 8.6 mg/dL — ABNORMAL LOW (ref 8.9–10.3)
Chloride: 106 mmol/L (ref 101–111)
Creatinine, Ser: 0.97 mg/dL (ref 0.61–1.24)
GFR calc non Af Amer: >60 mL/min (ref >60)
GLUCOSE: 111 mg/dL — AB (ref 65–99)
POTASSIUM: 3.4 mmol/L — AB (ref 3.5–5.1)
Sodium: 139 mmol/L (ref 135–145)

## 2015-04-21 LAB — APTT: APTT: 36 s (ref 24–36)

## 2015-04-21 LAB — CBC WITH DIFFERENTIAL/PLATELET
BASOS ABS: 0.1 10*3/uL (ref 0–0.1)
Basophils Relative: 1 %
Eosinophils Absolute: 0.3 10*3/uL (ref 0–0.7)
Eosinophils Relative: 3 %
HEMATOCRIT: 36.3 % — AB (ref 40.0–52.0)
HEMOGLOBIN: 12.7 g/dL — AB (ref 13.0–18.0)
LYMPHS PCT: 18 %
Lymphs Abs: 1.6 10*3/uL (ref 1.0–3.6)
MCH: 31 pg (ref 26.0–34.0)
MCHC: 35 g/dL (ref 32.0–36.0)
MCV: 88.5 fL (ref 80.0–100.0)
MONO ABS: 1.1 10*3/uL — AB (ref 0.2–1.0)
MONOS PCT: 12 %
NEUTROS ABS: 5.8 10*3/uL (ref 1.4–6.5)
NEUTROS PCT: 66 %
Platelets: 178 10*3/uL (ref 150–440)
RBC: 4.1 MIL/uL — ABNORMAL LOW (ref 4.40–5.90)
RDW: 13 % (ref 11.5–14.5)
WBC: 8.8 10*3/uL (ref 3.8–10.6)

## 2015-04-21 LAB — PROTIME-INR
INR: 1.06
Prothrombin Time: 14 seconds (ref 11.4–15.0)

## 2015-04-21 MED ORDER — HYDROMORPHONE HCL 1 MG/ML IJ SOLN
0.5000 mg | Freq: Once | INTRAMUSCULAR | Status: AC
  Administered 2015-04-21: 0.5 mg via INTRAVENOUS
  Filled 2015-04-21: qty 1

## 2015-04-21 MED ORDER — ONDANSETRON HCL 4 MG/2ML IJ SOLN
INTRAMUSCULAR | Status: AC
  Filled 2015-04-21: qty 2

## 2015-04-21 MED ORDER — LIDOCAINE-EPINEPHRINE (PF) 1 %-1:200000 IJ SOLN
INTRAMUSCULAR | Status: AC
  Filled 2015-04-21: qty 30

## 2015-04-21 MED ORDER — SODIUM CHLORIDE FLUSH 0.9 % IV SOLN
INTRAVENOUS | Status: AC
  Filled 2015-04-21: qty 100

## 2015-04-21 MED ORDER — HYDROMORPHONE HCL 1 MG/ML IJ SOLN
INTRAMUSCULAR | Status: DC
Start: 2015-04-21 — End: 2015-04-22
  Filled 2015-04-21: qty 1

## 2015-04-21 MED ORDER — FENTANYL CITRATE (PF) 100 MCG/2ML IJ SOLN
INTRAMUSCULAR | Status: AC
  Filled 2015-04-21: qty 2

## 2015-04-21 MED ORDER — ONDANSETRON HCL 4 MG/2ML IJ SOLN
4.0000 mg | Freq: Once | INTRAMUSCULAR | Status: AC
  Administered 2015-04-21: 4 mg via INTRAVENOUS

## 2015-04-21 MED ORDER — ONDANSETRON HCL 4 MG/2ML IJ SOLN
4.0000 mg | Freq: Once | INTRAMUSCULAR | Status: AC
  Administered 2015-04-21: 4 mg via INTRAVENOUS
  Filled 2015-04-21: qty 2

## 2015-04-21 MED ORDER — MIDAZOLAM HCL 5 MG/5ML IJ SOLN
INTRAMUSCULAR | Status: AC
  Filled 2015-04-21: qty 5

## 2015-04-21 MED ORDER — MORPHINE SULFATE (PF) 4 MG/ML IV SOLN
4.0000 mg | Freq: Once | INTRAVENOUS | Status: AC
  Administered 2015-04-21: 4 mg via INTRAVENOUS
  Filled 2015-04-21: qty 1

## 2015-04-21 MED ORDER — HYDROMORPHONE HCL 1 MG/ML IJ SOLN
0.5000 mg | Freq: Once | INTRAMUSCULAR | Status: AC
  Administered 2015-04-21: 0.5 mg via INTRAVENOUS

## 2015-04-21 MED ORDER — SODIUM CHLORIDE 0.9 % IV BOLUS (SEPSIS)
1000.0000 mL | Freq: Once | INTRAVENOUS | Status: AC
  Administered 2015-04-21: 1000 mL via INTRAVENOUS

## 2015-04-21 NOTE — ED Notes (Signed)
Bladder scan performed. Largest reading was 31ml.

## 2015-04-21 NOTE — ED Notes (Signed)
Pt to ed with c/o difficulty with urination,  Pt states he has had very little urine output since 10 am today.  Pt was seen here for same on 3/22.  Started abx, but no relief.

## 2015-04-21 NOTE — ED Notes (Signed)
Patient transported to CT 

## 2015-04-21 NOTE — ED Provider Notes (Signed)
[facility] Emergency Department Provider Note  ____________________________________________  Time seen: Approximately 6:04 PM  I have reviewed the triage vital signs and the nursing notes.   HISTORY  Chief Complaint Urinary Retention    HPI [name] is a 70 y.o. male who says he has been drinking water but is unable to eat. He's had this problem before had to have a suprapubic catheter placed because nobody could get through his penis with a Foley. Patient says he intermittently has spasms that make him feel like he needs to urinate but is not constantly feeling like he is full. Patient had a suprapubic bladder catheter placed before but it was pulled out because he was having bleeding with it. He has been up until this morning urinating through his penis.   Past Medical History  Diagnosis Date  . Cellulitis     foot  . GERD (gastroesophageal reflux disease)   . Hypertension   . BPH (benign prostatic hypertrophy) with urinary retention   . Chronic bacterial prostatitis   . Morbid obesity with BMI of 50.0-59.9, adult (HCC)   . Stasis dermatitis of both legs   . H/O: upper GI bleed   . Essential (primary) hypertension 02/21/2014  . Acid reflux 01/05/2013  . Stasis dermatitis of both legs   . Obstructive sleep apnea     uses C-Pap    Patient Active Problem List   Diagnosis Date Noted  . Urinary retention 01/16/2015  . Pain in shoulder 11/28/2014  . Arthralgia of hip 11/28/2014  . BPH (benign prostatic hypertrophy) with urinary retention   . Chronic bacterial prostatitis   . Morbid obesity with BMI of 50.0-59.9, adult (HCC)   . Stasis dermatitis of both legs   . H/O: upper GI bleed   . Essential (primary) hypertension 02/21/2014  . 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 Bladder retention 12/07/2013  . Benign prostatic hyperplasia with urinary obstruction 10/24/2013  . Body mass index (BMI) of 50-59.9 in adult (HCC) 10/11/2013  . Lower urinary  tract infection 09/14/2013  . Acid reflux 01/05/2013  . History of cholecystectomy 01/05/2013  . Adiposity 11/12/2012  . History of surgical procedure 11/12/2012  . Apnea, sleep 06/14/2012    Past Surgical History  Procedure Laterality Date  . Hip surgery      right  . Gastric bypass    . Cholecystectomy    . Colonoscopy  2006  . Pelvic examination under anesthesia cystourethroscopy / sigmoidoscopy  2015  . Hip replacement Right 2005    Current Outpatient Rx  Name  Route  Sig  Dispense  Refill  . Ascorbic Acid (VITAMIN C) 1000 MG tablet   Oral   Take 1,000 mg by mouth daily.          . B Complex Vitamins (VITAMIN-B COMPLEX) TABS   Oral   Take 1 tablet by mouth daily.          . cephALEXin (KEFLEX) 500 MG capsule   Oral   Take 1 capsule (500 mg total) by mouth 2 (two) times daily.   20 capsule   0   . ferrous sulfate 325 (65 FE) MG tablet   Oral   Take 325 mg by mouth daily.          . Multiple Vitamin (MULTI-VITAMINS) TABS   Oral   Take 1 tablet by mouth daily.          Marland Kitchen omeprazole (PRILOSEC) 20 MG capsule   Oral  Take 20 mg by mouth daily.         . pantoprazole (PROTONIX) 40 MG tablet   Oral   Take 40 mg by mouth daily.          . tamsulosin (FLOMAX) 0.4 MG CAPS capsule   Oral   Take 0.4 mg by mouth daily.          Marland Kitchen triamterene-hydrochlorothiazide (MAXZIDE-25) 37.5-25 MG tablet   Oral   Take 1 tablet by mouth daily.         Marland Kitchen oxyCODONE-acetaminophen (ROXICET) 5-325 MG tablet   Oral   Take 1 tablet by mouth every 6 (six) hours as needed for moderate pain or severe pain. Patient not taking: Reported on 02/23/2015   10 tablet   0     Allergies Aspirin  Family History  Problem Relation Age of Onset  . Diabetes Mother   . Prostate cancer Neg Hx   . Bladder Cancer Neg Hx     Social History Social History  Substance Use Topics  . Smoking status: Former Smoker    Types: Cigarettes, Pipe  . Smokeless tobacco: None  .  Alcohol Use: No    Review of Systems Constitutional: No fever/chills Eyes: No visual changes. ENT: No sore throat. Cardiovascular: Denies chest pain. Respiratory: Denies shortness of breath. Gastrointestinal: No abdominal pain.  No nausea, no vomiting.  No diarrhea.  No constipation. Genitourinary: Patient had dysuria earlier Musculoskeletal: Negative for back pain. Skin: Negative for rash. Neurological: Negative for headaches, focal weakness or numbness.  10-point ROS otherwise negative.  ____________________________________________   PHYSICAL EXAM:  VITAL SIGNS: ED Triage Vitals  Enc Vitals Group     BP 04/21/15 1705 118/58 mmHg     Pulse Rate 04/21/15 1705 76     Resp 04/21/15 1705 20     Temp 04/21/15 1705 98.4 F (36.9 C)     Temp Source 04/21/15 1705 Oral     SpO2 04/21/15 1705 96 %     Weight 04/21/15 1705 365 lb (165.563 kg)     Height 04/21/15 1705  (1.778 m)     Head Cir --      Peak Flow --      Pain Score 04/21/15 1706 9     Pain Loc --      Pain Edu? --      Excl. in GC? --     Constitutional: Alert and oriented. Well appearing and in no acute distress. Eyes: Conjunctivae are normal. PERRL. EOMI. Head: Atraumatic. Nose: No congestion/rhinnorhea. Mouth/Throat: Mucous membranes are moist.  Oropharynx non-erythematous. Neck: No stridor.   Cardiovascular: Normal rate, regular rhythm. Grossly normal heart sounds.  Good peripheral circulation. Respiratory: Normal respiratory effort.  No retractions. Lungs CTAB. Gastrointestinal: Soft and nontender. No distention. No abdominal bruits. No CVA tenderness. Patient has 2 overlapping pannus. Musculoskeletal: No lower extremity tenderness nor edema.  No joint effusions. Neurologic:  Normal speech and language. No gross focal neurologic deficits are appreciated. No gait instability. Skin:  Skin is warm, dry and intact. No rash noted. Psychiatric: Mood and affect are normal. Speech and behavior are  normal.  ____________________________________________   LABS (all labs ordered are listed, but only abnormal results are displayed)  Labs Reviewed  BASIC METABOLIC PANEL - Abnormal; Notable for the following:    Potassium 3.4 (*)    Glucose, Bld 111 (*)    BUN 21 (*)    Calcium 8.6 (*)    All other components within  normal limits  CBC WITH DIFFERENTIAL/PLATELET - Abnormal; Notable for the following:    RBC 4.10 (*)    Hemoglobin 12.7 (*)    HCT 36.3 (*)    Monocytes Absolute 1.1 (*)    All other components within normal limits  URINALYSIS COMPLETEWITH MICROSCOPIC ([facility] ONLY) - Abnormal; Notable for the following:    Color, Urine YELLOW (*)    APPearance HAZY (*)    Hgb urine dipstick 2+ (*)    Leukocytes, UA 1+ (*)    Squamous Epithelial / LPF 0-5 (*)    All other components within normal limits  PROTIME-INR  APTT   ____________________________________________  EKG   ____________________________________________  RADIOLOGY   ____________________________________________   PROCEDURES Bladder scan only reads a maximum of 31 or 33 cc of urine and what we think his bladder. Ultrasound in the ER is unable to find any distended fluid-filled structure that looks like a bladder. I will attempt to CT him one or 2 slices through the pelvis and discussed briefly with radiologist, to see if his bladder is distended.  ____________________________________________   INITIAL IMPRESSION / ASSESSMENT AND PLAN / ED COURSE  Pertinent labs & imaging results that were available during my care of the patient were reviewed by me and considered in my medical decision making (see chart for details).   ____________________________________________   FINAL CLINICAL IMPRESSION(S) / ED DIAGNOSES  Final diagnoses:  Urinary retention      Arnaldo NatalPaul F Kelten Enochs, MD 04/22/15 [phone]

## 2015-04-21 NOTE — ED Notes (Signed)
Pt reports last time this issue occurred urology had to place a suprapubic  catheter

## 2015-04-22 DIAGNOSIS — R339 Retention of urine, unspecified: Secondary | ICD-10-CM | POA: Diagnosis not present

## 2015-04-22 LAB — URINALYSIS COMPLETE WITH MICROSCOPIC (ARMC ONLY)
Bacteria, UA: NONE SEEN
Bilirubin Urine: NEGATIVE
Glucose, UA: NEGATIVE mg/dL
Ketones, ur: NEGATIVE mg/dL
Nitrite: NEGATIVE
PH: 5 (ref 5.0–8.0)
Protein, ur: NEGATIVE mg/dL
Specific Gravity, Urine: 1.016 (ref 1.005–1.030)

## 2015-04-22 MED ORDER — MIDAZOLAM HCL 2 MG/2ML IJ SOLN
2.0000 mg | Freq: Once | INTRAMUSCULAR | Status: AC
  Administered 2015-04-22: 2 mg via INTRAVENOUS

## 2015-04-22 MED ORDER — FENTANYL CITRATE (PF) 100 MCG/2ML IJ SOLN
50.0000 ug | Freq: Once | INTRAMUSCULAR | Status: AC
  Administered 2015-04-22: 50 ug via INTRAVENOUS

## 2015-04-22 NOTE — Discharge Instructions (Signed)
1. Continue and finish your antibiotic. 2. Return to the ER for worsening symptoms, persistent vomiting, fever or other concerns.  Acute Urinary Retention, Male Acute urinary retention is the temporary inability to urinate. This is a common problem in older men. As men age their prostates become larger and block the flow of urine from the bladder. This is usually a problem that has come on gradually.  HOME CARE INSTRUCTIONS If you are sent home with a Foley catheter and a drainage system, you will need to discuss the best course of action with your health care provider. While the catheter is in, maintain a good intake of fluids. Keep the drainage bag emptied and lower than your catheter. This is so that contaminated urine will not flow back into your bladder, which could lead to a urinary tract infection. There are two main types of drainage bags. One is a large bag that usually is used at night. It has a good capacity that will allow you to sleep through the night without having to empty it. The second type is called a leg bag. It has a smaller capacity, so it needs to be emptied more frequently. However, the main advantage is that it can be attached by a leg strap and can go underneath your clothing, allowing you the freedom to move about or leave your home. Only take over-the-counter or prescription medicines for pain, discomfort, or fever as directed by your health care provider.  SEEK MEDICAL CARE IF:  You develop a low-grade fever.  You experience spasms or leakage of urine with the spasms. SEEK IMMEDIATE MEDICAL CARE IF:   You develop chills or fever.  Your catheter stops draining urine.  Your catheter falls out.  You start to develop increased bleeding that does not respond to rest and increased fluid intake. MAKE SURE YOU:  Understand these instructions.  Will watch your condition.  Will get help right away if you are not doing well or get worse.   This information is not  intended to replace advice given to you by your health care provider. Make sure you discuss any questions you have with your health care provider.   Document Released: 04/21/2000 Document Revised: 05/30/2014 Document Reviewed: 06/24/2012 Elsevier Interactive Patient Education [employer]2016 [employer]

## 2015-04-22 NOTE — ED Provider Notes (Signed)
-----------------------------------------   2:04 AM on 04/22/2015 -----------------------------------------  Patient care was assumed from Dr. Darnelle CatalanMalinda. Patient had successful suprapubic catheter placement under moderate sedation by interventional radiology approximately 2 hours ago. He is alert, oriented, tolerating ice chips without emesis. States "I feel great" and is eager for discharge home. I directly visualize the superpubic catheter which is securely in place without active bleeding. He will continue and finish his current course of Keflex and follow-up with his urologist Dr. Sherryl BartersBudzyn. Strict return precautions given. Patient and spouse verbalize understanding and agree with plan of care.  Irean HongJade J Sung, MD 04/22/15 [phone]

## 2015-04-22 NOTE — ED Notes (Signed)
Reviewed d/c instructions, follow up care, pain management, catheter, continuation of current antibiotic/pain medication with pt. PT verbalized understanding.

## 2015-04-22 NOTE — Procedures (Signed)
Technically successful US and CT guided placed of a 10 Fr drainage catheter placement into the urinary bladder yielding >400 cc of urine.   EBL: None No immediate post procedural complications.   Katherina RightJay Jayr Lupercio, MD Pager #: [phone]

## 2015-04-30 ENCOUNTER — Telehealth: Payer: Self-pay | Admitting: Urology

## 2015-04-30 NOTE — Telephone Encounter (Signed)
Patient called because he never got a return call from us regarding his cath that was placed. He called twice wanting a return phone call. He has since had shoulder surgery. I offered him an appointment for this Friday with Dr. Sherryl BartersBudzyn but he said he does  Not want to see him since he wouldn't respond to the message or call him back? He is going to seek treatment elsewhere and will not be back to out clinic.  [name] DusterMichelle

## 2015-05-03 NOTE — Telephone Encounter (Signed)
I contacted the patient to discuss his concerns.  He indicated that it upset him that he was not told that a urine culture takes 3 days to receive urine culture results.  He was insisting that he be treated on the day that he gave his urine sample.  He ultimately went to the ED and was treated with an antibiotic.    I attempted to discuss his concerns regarding his catheter.  He explained that he did not have any concerns.  Patient explained that he would be receiving urology care elsewhere.  I apologized and offered for him to contact the office if there was anything that we could do for him.  Glenice BowLisa Hartley Practice Admnistrator

## 2015-05-03 NOTE — Telephone Encounter (Signed)
This is the first I am hearing of this? When was his catheter replaced? Why was I not notified sooner?

## 2015-05-09 DIAGNOSIS — M75101 Unspecified rotator cuff tear or rupture of right shoulder, not specified as traumatic: Secondary | ICD-10-CM

## 2015-05-09 DIAGNOSIS — M12811 Other specific arthropathies, not elsewhere classified, right shoulder: Secondary | ICD-10-CM | POA: Insufficient documentation

## 2015-06-18 ENCOUNTER — Other Ambulatory Visit: Payer: Self-pay | Admitting: Urology

## 2015-06-18 DIAGNOSIS — R339 Retention of urine, unspecified: Secondary | ICD-10-CM

## 2015-06-20 ENCOUNTER — Ambulatory Visit
Admission: RE | Admit: 2015-06-20 | Discharge: 2015-06-20 | Disposition: A | Discharge status: Home / Self Care | Payer: Medicare Other | Source: Ambulatory Visit | Attending: Urology | Admitting: Urology

## 2015-06-20 DIAGNOSIS — I872 Venous insufficiency (chronic) (peripheral): Secondary | ICD-10-CM | POA: Diagnosis not present

## 2015-06-20 DIAGNOSIS — Z833 Family history of diabetes mellitus: Secondary | ICD-10-CM | POA: Insufficient documentation

## 2015-06-20 DIAGNOSIS — Z6841 Body Mass Index (BMI) 40.0 and over, adult: Secondary | ICD-10-CM | POA: Diagnosis not present

## 2015-06-20 DIAGNOSIS — G4733 Obstructive sleep apnea (adult) (pediatric): Secondary | ICD-10-CM | POA: Insufficient documentation

## 2015-06-20 DIAGNOSIS — N401 Enlarged prostate with lower urinary tract symptoms: Secondary | ICD-10-CM | POA: Insufficient documentation

## 2015-06-20 DIAGNOSIS — Z96641 Presence of right artificial hip joint: Secondary | ICD-10-CM | POA: Insufficient documentation

## 2015-06-20 DIAGNOSIS — Z79899 Other long term (current) drug therapy: Secondary | ICD-10-CM | POA: Insufficient documentation

## 2015-06-20 DIAGNOSIS — N320 Bladder-neck obstruction: Secondary | ICD-10-CM | POA: Diagnosis not present

## 2015-06-20 DIAGNOSIS — R339 Retention of urine, unspecified: Secondary | ICD-10-CM

## 2015-06-20 DIAGNOSIS — Z9884 Bariatric surgery status: Secondary | ICD-10-CM | POA: Insufficient documentation

## 2015-06-20 DIAGNOSIS — K219 Gastro-esophageal reflux disease without esophagitis: Secondary | ICD-10-CM | POA: Insufficient documentation

## 2015-06-20 DIAGNOSIS — I10 Essential (primary) hypertension: Secondary | ICD-10-CM | POA: Insufficient documentation

## 2015-06-20 DIAGNOSIS — B9689 Other specified bacterial agents as the cause of diseases classified elsewhere: Secondary | ICD-10-CM | POA: Insufficient documentation

## 2015-06-20 DIAGNOSIS — N411 Chronic prostatitis: Secondary | ICD-10-CM | POA: Diagnosis not present

## 2015-06-20 DIAGNOSIS — Z87891 Personal history of nicotine dependence: Secondary | ICD-10-CM | POA: Diagnosis not present

## 2015-06-20 DIAGNOSIS — Z886 Allergy status to analgesic agent status: Secondary | ICD-10-CM | POA: Insufficient documentation

## 2015-06-20 HISTORY — DX: Idiopathic sleep related nonobstructive alveolar hypoventilation: G47.34

## 2015-06-20 MED ORDER — MIDAZOLAM HCL 5 MG/5ML IJ SOLN
INTRAMUSCULAR | Status: AC | PRN
  Administered 2015-06-20: 1 mg via INTRAVENOUS
  Administered 2015-06-20: 2 mg via INTRAVENOUS

## 2015-06-20 MED ORDER — LIDOCAINE VISCOUS 2 % MT SOLN
OROMUCOSAL | Status: AC
  Filled 2015-06-20: qty 15

## 2015-06-20 MED ORDER — SODIUM CHLORIDE 0.9 % IV SOLN
INTRAVENOUS | Status: DC
  Administered 2015-06-20: 09:00:00 via INTRAVENOUS

## 2015-06-20 MED ORDER — IOPAMIDOL (ISOVUE-300) INJECTION 61%
30.0000 mL | Freq: Once | INTRAVENOUS | Status: AC | PRN
  Administered 2015-06-20: 5 mL via INTRAVENOUS

## 2015-06-20 MED ORDER — LIDOCAINE HCL 2 % EX GEL
CUTANEOUS | Status: AC | PRN
  Administered 2015-06-20: 1 via TOPICAL

## 2015-06-20 MED ORDER — LIDOCAINE HCL (PF) 1 % IJ SOLN
INTRAMUSCULAR | Status: AC | PRN
  Administered 2015-06-20: 8 mL

## 2015-06-20 MED ORDER — FENTANYL CITRATE (PF) 100 MCG/2ML IJ SOLN
INTRAMUSCULAR | Status: AC | PRN
  Administered 2015-06-20: 50 ug via INTRAVENOUS
  Administered 2015-06-20: 25 ug via INTRAVENOUS

## 2015-06-20 NOTE — Procedures (Signed)
Interventional Radiology Procedure Note  Procedure: Replacement of supra-pubic catheter, with up-size to a 89F pigtail catheter.   Complications: None Recommendations:  - Ok to shower tomorrow - To gravity drain - Routine drain care   Signed,  Tyreka Henneke S. LYvone Neuoreta AveWagner, DO

## 2015-06-20 NOTE — H&P (Signed)
Chief Complaint: Urinary obstruction  Referring Physician(s): [name],[name] C  Supervising Physician: [name] MorWagner, [name] Antonacci  Patient Status: In-pt / Out-pt  History of Present Illness: [name] is a 70 y.o. male with a history of urinary outlet obstruction, with prior supra-pubic catheter placement.   An 4F tube was placed with CT/US 04/21/2015.  This tube was replaced by the ED on a recent visit to a 77F tube.    He presents today to up-size.    He has had no recent fevers.   Past Medical History  Diagnosis Date  . Cellulitis     foot  . GERD (gastroesophageal reflux disease)   . Hypertension   . BPH (benign prostatic hypertrophy) with urinary retention   . Chronic bacterial prostatitis   . Morbid obesity with BMI of 50.0-59.9, adult (HCC)   . Stasis dermatitis of both legs   . H/O: upper GI bleed   . Essential (primary) hypertension 02/21/2014  . Acid reflux 01/05/2013  . Stasis dermatitis of both legs   . Obstructive sleep apnea     uses C-Pap  . Oxygen desaturation during sleep at night-1 1/2 L    Past Surgical History  Procedure Laterality Date  . Hip surgery      right  . Gastric bypass    . Cholecystectomy    . Colonoscopy  2006  . Pelvic examination under anesthesia cystourethroscopy / sigmoidoscopy  2015  . Hip replacement Right 2005    Allergies: Aspirin  Medications: Prior to Admission medications   Medication Sig Start Date End Date Taking? Authorizing Provider  Ascorbic Acid (VITAMIN C) 1000 MG tablet Take 1,000 mg by mouth daily.    Yes Historical Provider, MD  B Complex Vitamins (VITAMIN-B COMPLEX) TABS Take 1 tablet by mouth daily.    Yes Historical Provider, MD  cephALEXin (KEFLEX) 500 MG capsule Take 1 capsule (500 mg total) by mouth 2 (two) times daily. 04/18/15  Yes [name] V Bacon Menshew, PA-C  ferrous sulfate 325 (65 FE) MG tablet Take 325 mg by mouth daily.    Yes Historical Provider, MD  Multiple Vitamin (MULTI-VITAMINS) TABS Take 1  tablet by mouth daily.    Yes Historical Provider, MD  omeprazole (PRILOSEC) 20 MG capsule Take 20 mg by mouth daily.   Yes Historical Provider, MD  oxyCODONE-acetaminophen (ROXICET) 5-325 MG tablet Take 1 tablet by mouth every 6 (six) hours as needed for moderate pain or severe pain. 01/16/15  Yes [name] SaranSital Mody, MD  tamsulosin (FLOMAX) 0.4 MG CAPS capsule Take 0.4 mg by mouth daily.    Yes Historical Provider, MD  triamterene-hydrochlorothiazide (MAXZIDE-25) 37.5-25 MG tablet Take 1 tablet by mouth daily.   Yes Historical Provider, MD  pantoprazole (PROTONIX) 40 MG tablet Take 40 mg by mouth daily. Reported on 06/20/2015    Historical Provider, MD     Family History  Problem Relation Age of Onset  . Diabetes Mother   . Prostate cancer Neg Hx   . Bladder Cancer Neg Hx     Social History   Social History  . Marital Status: Married    Spouse Name: N/A  . Number of Children: N/A  . Years of Education: N/A   Social History Main Topics  . Smoking status: Former Smoker    Types: Cigarettes, Pipe    Quit date: 06/19/1968  . Smokeless tobacco: None  . Alcohol Use: No  . Drug Use: No  . Sexual Activity: Not Asked   Other Topics Concern  .  None   Social History Narrative      Review of Systems: A 12 point ROS discussed and pertinent positives are indicated in the HPI above.  All other systems are negative.  Review of Systems  Vital Signs: BP 107/91 mmHg  Pulse 76  Temp(Src) 98.6 F (37 C) (Oral)  Resp 18  Ht  (1.778 m)  Wt 341 lb (154.677 kg)  BMI 48.93 kg/m2  SpO2 93%  Physical Exam  Mallampati Score:  2  Imaging: No results found.  Labs:  CBC:  Recent Labs  01/16/15 0134 03/28/15 1734 04/21/15 1728  WBC 14.1* 7.8 8.8  HGB 12.9* 13.1 12.7*  HCT 39.6* 38.2* 36.3*  PLT 228 186 178    COAGS:  Recent Labs  04/21/15 1729  INR 1.06  APTT 36    BMP:  Recent Labs  01/16/15 0134 03/28/15 1734 04/21/15 1728  NA 139 140 139  K 4.0 3.9 3.4*    CL 106 104 106  CO2 GLUCOSE 135* 109* 111*  BUN 29* 20 21*  CALCIUM 8.8* 8.8* 8.6*  CREATININE 1.28* 1.10 0.97  GFRNONAA 56* >60 >60  GFRAA >60 >60 >60    LIVER FUNCTION TESTS:  Recent Labs  01/16/15 0134  BILITOT 0.8  AST 14*  ALT 16*  ALKPHOS 84  PROT 8.1  ALBUMIN 4.0    TUMOR MARKERS: No results for input(s): AFPTM, CEA, CA199, CHROMGRNA in the last 8760 hours.  Assessment and Plan:  [name] is a 70 yo male presenting with urinary outlet obstruction managed with a supra-pubic catheter.    We are planning an up-size today from 35F catheter placed by the ED.  The goal is 31F, but this may take staging.   Risks and Benefits discussed with the patient including bleeding, infection, damage to adjacent structures, viscous perforation/fistula connection, and sepsis. All of the patient's questions were answered, patient is agreeable to proceed. Consent signed and in chart.    Thank you for this interesting consult.  I greatly enjoyed meeting [name] and look forward to participating in their care.  A copy of this report was sent to the requesting provider on this date.  Electronically Signed: Gilmer [name] 06/20/2015, 9:37 AM   I spent a total of    15 Minutes in face to face in clinical consultation, greater than 50% of which was counseling/coordinating care for suprapubic catheter management and exchange, with up-sizing.

## 2015-08-24 ENCOUNTER — Ambulatory Visit: Payer: Medicare Other

## 2015-10-04 ENCOUNTER — Ambulatory Visit: Payer: Medicare Other

## 2015-11-27 ENCOUNTER — Encounter: Payer: Medicare Other | Attending: Physician Assistant | Admitting: Physician Assistant

## 2015-11-27 DIAGNOSIS — L97312 Non-pressure chronic ulcer of right ankle with fat layer exposed: Secondary | ICD-10-CM | POA: Diagnosis not present

## 2015-11-27 DIAGNOSIS — S81811A Laceration without foreign body, right lower leg, initial encounter: Secondary | ICD-10-CM | POA: Diagnosis not present

## 2015-11-27 DIAGNOSIS — I872 Venous insufficiency (chronic) (peripheral): Secondary | ICD-10-CM | POA: Diagnosis not present

## 2015-11-27 DIAGNOSIS — Z6841 Body Mass Index (BMI) 40.0 and over, adult: Secondary | ICD-10-CM | POA: Diagnosis not present

## 2015-11-27 DIAGNOSIS — I10 Essential (primary) hypertension: Secondary | ICD-10-CM | POA: Diagnosis not present

## 2015-11-27 DIAGNOSIS — X58XXXA Exposure to other specified factors, initial encounter: Secondary | ICD-10-CM | POA: Diagnosis not present

## 2015-11-27 DIAGNOSIS — G4733 Obstructive sleep apnea (adult) (pediatric): Secondary | ICD-10-CM | POA: Diagnosis not present

## 2015-11-28 NOTE — Progress Notes (Signed)
[name], Neal R. ([id number]) Visit Report for 11/27/2015 Abuse/Suicide Risk Screen Details Patient Name: [name], [name] Date of Service: 11/27/2015 1:30 PM Medical Record Number: [id number] Patient Account Number: [id number] Date of Birth/Sex: 12/19/1945 11(70 y.o. Male) Treating RN: Clover MealyAfful, RN, BSN, [place] Sinkita Primary Care Physician: Etheleen NicksMACDONALD, KERI Other Clinician: Referring Physician: Etheleen NicksMACDONALD, KERI Treating Physician/Extender: Linwood DibblesSTONE III, HOYT Weeks in Treatment: 0 Abuse/Suicide Risk Screen Items Answer ABUSE/SUICIDE RISK SCREEN: Has anyone close to you tried to hurt or harm you recentlyo No Do you feel uncomfortable with anyone in your familyo No Has anyone forced you do things that you didnot want to doo No Do you have any thoughts of harming yourselfo No Patient displays signs or symptoms of abuse and/or neglect. No Electronic Signature(s) Signed: 11/27/2015 5:16:16 PM By: Elpidio EricAfful, Rita BSN, RN Entered By: Elpidio EricAfful, Rita on 11/27/2015 13:39:27 [name], [name] ([id number]) -------------------------------------------------------------------------------- Activities of Daily Living Details Patient Name: [name], [name] Date of Service: 11/27/2015 1:30 PM Medical Record Number: [id number] Patient Account Number: [id number] Date of Birth/Sex: 10/20/1945 [age] y.o. Male) Treating RN: Clover MealyAfful, RN, BSN, [place] Sinkita Primary Care Physician: Etheleen NicksMACDONALD, KERI Other Clinician: Referring Physician: Etheleen NicksMACDONALD, KERI Treating Physician/Extender: Linwood DibblesSTONE III, HOYT Weeks in Treatment: 0 Activities of Daily Living Items Answer Activities of Daily Living (Please select one for each item) Drive Automobile Completely Able Take Medications Completely Able Use Telephone Completely Able Care for Appearance Completely Able Use Toilet Completely Able Bath / Shower Completely Able Dress Self Completely Able Feed Self Completely Able Walk Completely Able Get In / Out Bed Completely Able Housework Completely Able Prepare  Meals Completely Able Handle Money Completely Able Shop for Self Completely Able Electronic Signature(s) Signed: 11/27/2015 5:16:16 PM By: Elpidio EricAfful, Rita BSN, RN Entered By: Elpidio EricAfful, Rita on 11/27/2015 13:39:47 [name], [name] ([id number]) -------------------------------------------------------------------------------- Education Assessment Details Patient Name: [name], [name] Date of Service: 11/27/2015 1:30 PM Medical Record Number: [id number] Patient Account Number: [id number] Date of Birth/Sex: 01/20/1946 [age] y.o. Male) Treating RN: Clover MealyAfful, RN, BSN, [place] Sinkita Primary Care Physician: Etheleen NicksMACDONALD, KERI Other Clinician: Referring Physician: Etheleen NicksMACDONALD, KERI Treating Physician/Extender: Linwood DibblesSTONE III, HOYT Weeks in Treatment: 0 Primary Learner Assessed: Patient Learning Preferences/Education Level/Primary Language Learning Preference: Explanation Highest Education Level: College or Above Preferred Language: English Cognitive Barrier Assessment/Beliefs Language Barrier: No Physical Barrier Assessment Impaired Vision: Yes Glasses Impaired Hearing: No Knowledge/Comprehension Assessment Knowledge Level: High Comprehension Level: High Ability to understand written High instructions: Ability to understand verbal High instructions: Motivation Assessment Anxiety Level: Calm Cooperation: Cooperative Education Importance: Acknowledges Need Interest in Health Problems: Asks Questions Perception: Coherent Willingness to Engage in Self- High Management Activities: Readiness to Engage in Self- High Management Activities: Electronic Signature(s) Signed: 11/27/2015 5:16:16 PM By: Elpidio EricAfful, Rita BSN, RN Entered By: Elpidio EricAfful, Rita on 11/27/2015 13:40:13 [name], [name] ([id number]) -------------------------------------------------------------------------------- Fall Risk Assessment Details Patient Name: [name], [name] Date of Service: 11/27/2015 1:30 PM Medical Record Number: [id number] Patient  Account Number: [id number] Date of Birth/Sex: 03/27/1945 [age] y.o. Male) Treating RN: Clover MealyAfful, RN, BSN, Rita Primary Care Physician: Etheleen NicksMACDONALD, KERI Other Clinician: Referring Physician: Etheleen NicksMACDONALD, KERI Treating Physician/Extender: Linwood DibblesSTONE III, HOYT Weeks in Treatment: 0 Fall Risk Assessment Items Have you had 2 or more falls in the last 12 monthso 0 No Have you had any fall that resulted in injury in the last 12 monthso 0 No FALL RISK ASSESSMENT: History of falling - immediate or within 3 months 0 No Secondary diagnosis 0 No Ambulatory aid None/bed rest/wheelchair/nurse 0 Yes Crutches/cane/walker 0 No Furniture 0 No IV Access/Saline Lock  0 No Gait/Training Normal/bed rest/immobile 0 No Weak 10 Yes Impaired 0 No Mental Status Oriented to own ability 0 Yes Electronic Signature(s) Signed: 11/27/2015 5:16:16 PM By: Elpidio EricAfful, Rita BSN, RN Entered By: Elpidio EricAfful, Rita on 11/27/2015 13:40:34 [name], [name] ([id number]) -------------------------------------------------------------------------------- Foot Assessment Details Patient Name: [name], [name] Date of Service: 11/27/2015 1:30 PM Medical Record Number: [id number] Patient Account Number: [id number] Date of Birth/Sex: 03/04/1945 [age] y.o. Male) Treating RN: Clover MealyAfful, RN, BSN, [place] Sinkita Primary Care Physician: Etheleen NicksMACDONALD, KERI Other Clinician: Referring Physician: Etheleen NicksMACDONALD, KERI Treating Physician/Extender: Linwood DibblesSTONE III, HOYT Weeks in Treatment: 0 Foot Assessment Items Site Locations + = Sensation present, - = Sensation absent, C = Callus, U = Ulcer R = Redness, W = Warmth, M = Maceration, PU = Pre-ulcerative lesion F = Fissure, S = Swelling, D = Dryness Assessment Right: Left: Other Deformity: No No Prior Foot Ulcer: No No Prior Amputation: No No Charcot Joint: No No Ambulatory Status: Ambulatory With Help Assistance Device: Cane Gait: Surveyor, miningUnsteady Electronic Signature(s) Signed: 11/27/2015 5:16:16 PM By: Elpidio EricAfful, Rita BSN, RN Entered By: Elpidio EricAfful,  Rita on 11/27/2015 13:40:58 Hosmer, [name] ([id number]) -------------------------------------------------------------------------------- Nutrition Risk Assessment Details Patient Name: [name], [name] Date of Service: 11/27/2015 1:30 PM Medical Record Number: [id number] Patient Account Number: [id number] Date of Birth/Sex: 11/04/1945 [age] y.o. Male) Treating RN: Clover MealyAfful, RN, BSN, Rita Primary Care Physician: Etheleen NicksMACDONALD, KERI Other Clinician: Referring Physician: Etheleen NicksMACDONALD, KERI Treating Physician/Extender: Linwood DibblesSTONE III, HOYT Weeks in Treatment: 0 Height (in): 70 Weight (lbs): 330 Body Mass Index (BMI): 47.3 Nutrition Risk Assessment Items NUTRITION RISK SCREEN: I have an illness or condition that made me change the kind and/or 0 No amount of food I eat I eat fewer than two meals per day 0 No I eat few fruits and vegetables, or milk products 0 No I have three or more drinks of beer, liquor or wine almost every day 0 No I have tooth or mouth problems that make it hard for me to eat 0 No I don't always have enough money to buy the food I need 0 No I eat alone most of the time 0 No I take three or more different prescribed or over-the-counter drugs a 0 No day Without wanting to, I have lost or gained 10 pounds in the last six 0 No months I am not always physically able to shop, cook and/or feed myself 0 No Nutrition Protocols Good Risk Protocol 0 No interventions needed Moderate Risk Protocol Electronic Signature(s) Signed: 11/27/2015 5:16:16 PM By: Elpidio EricAfful, Rita BSN, RN Entered By: Elpidio EricAfful, Rita on 11/27/2015 13:40:42

## 2015-11-28 NOTE — Progress Notes (Signed)
[name] ([id number]) Visit Report for 11/27/2015 Allergy List Details Patient Name: [name], [name]. Date of Service: 11/27/2015 1:30 PM Medical Record Number: [id number] Patient Account Number: [id number] Date of Birth/Sex: 08-27-1945 (70 y.o. Male) Treating RN: [name] Mealy, RN, BSN, [place] [name] Primary Care Physician: [name] Other Clinician: Referring Physician: Etheleen [name] Treating Physician/Extender: [name] Weeks in Treatment: 0 Allergies Active Allergies aspirin Allergy Notes Electronic Signature(s) Signed: 11/27/2015 5:16:16 PM By: [name] BSN, RN Entered By: [name] on 11/27/2015 13:42:12 [name] ([id number]) -------------------------------------------------------------------------------- Arrival Information Details Patient Name: [name]. Date of Service: 11/27/2015 1:30 PM Medical Record Number: [id number] Patient Account Number: [id number] Date of Birth/Sex: Mar 22, 1945 (70 y.o. Male) Treating RN: [name] Mealy, RN, BSN, [name] [name] Primary Care Physician: [name] Other Clinician: Referring Physician: Etheleen [name] Treating Physician/Extender: [name], [name] Weeks in Treatment: 0 Visit Information Patient Arrived: [name] Time: 13:34 Accompanied By: wife Transfer Assistance: None Patient Identification Verified: Yes Secondary Verification Process Completed: Yes Patient Requires Transmission-Based No Precautions: Patient Has Alerts: No History Since Last Visit All ordered tests and consults were completed: No Added or deleted any medications: No Any new allergies or adverse reactions: No Had a fall or experienced change in activities of daily living that may affect risk of falls: No Signs or symptoms of abuse/neglect since last visito No Hospitalized since last visit: No Has Dressing in Place as Prescribed: Yes Electronic Signature(s) Signed: 11/27/2015 5:16:16 PM By: [name] BSN, RN Entered By: [name] on  11/27/2015 13:38:15 [name] ([id number]) -------------------------------------------------------------------------------- Clinic Level of Care Assessment Details Patient Name: [name]. Date of Service: 11/27/2015 1:30 PM Medical Record Number: [id number] Patient Account Number: [id number] Date of Birth/Sex: [birth year] (70 y.o. Male) Treating RN: [name] Mealy, RN, BSN, [name] [name] Primary Care Physician: [name] Other Clinician: Referring Physician: Etheleen [name] Treating Physician/Extender: [name], [name] Weeks in Treatment: 0 Clinic Level of Care Assessment Items TOOL 1 Quantity Score []  - Use when EandM and Procedure is performed on INITIAL visit 0 ASSESSMENTS - Nursing Assessment / Reassessment X - General Physical Exam (combine w/ comprehensive assessment (listed just 1 20 below) when performed on new pt. evals) X - Comprehensive Assessment (HX, ROS, Risk Assessments, Wounds Hx, etc.) 1 25 ASSESSMENTS - Wound and Skin Assessment / Reassessment []  - Dermatologic / Skin Assessment (not related to wound area) 0 ASSESSMENTS - Ostomy and/or Continence Assessment and Care []  - Incontinence Assessment and Management 0 []  - Ostomy Care Assessment and Management (repouching, etc.) 0 PROCESS - Coordination of Care X - Simple Patient / Family Education for ongoing care 1 15 []  - Complex (extensive) Patient / Family Education for ongoing care 0 X - Staff obtains Chiropractor, Records, Test Results / Process Orders 1 10 []  - Staff telephones HHA, Nursing Homes / Clarify orders / etc 0 []  - Routine Transfer to another Facility (non-emergent condition) 0 []  - Routine Hospital Admission (non-emergent condition) 0 X - New Admissions / Manufacturing engineer / Ordering NPWT, Apligraf, etc. 1 15 []  - Emergency Hospital Admission (emergent condition) 0 PROCESS - Special Needs []  - Pediatric / Minor Patient Management 0 []  - Isolation Patient Management 0 [name], [name]  ([id number]) []  - Hearing / Language / Visual special needs 0 []  - Assessment of Community assistance (transportation, D/C planning, etc.) 0 []  - Additional assistance / Altered mentation 0 []  - Support Surface(s) Assessment (bed, cushion, seat, etc.) 0 INTERVENTIONS - Miscellaneous []  - External ear exam 0 []  -  Patient Transfer (multiple staff / Nurse, adult / Similar devices) 0 []  - Simple Staple / Suture removal (25 or less) 0 []  - Complex Staple / Suture removal (26 or more) 0 []  - Hypo/Hyperglycemic Management (do not check if billed separately) 0 X - Ankle / Brachial Index (ABI) - do not check if billed separately 1 15 Has the patient been seen at the hospital within the last three years: Yes Total Score: 100 Level Of Care: New/Established - Level 3 Electronic Signature(s) Signed: 11/27/2015 5:16:16 PM By: [name] BSN, RN Entered By: [name] on 11/27/2015 14:10:05 [name] ([id number]) -------------------------------------------------------------------------------- Encounter Discharge Information Details Patient Name: [name]. Date of Service: 11/27/2015 1:30 PM Medical Record Number: [id number] Patient Account Number: [id number] Date of Birth/Sex: 11/16/1945 (70 y.o. Male) Treating RN: [name] Mealy, RN, BSN, [name] Primary Care Physician: [name] Other Clinician: Referring Physician: Etheleen [name] Treating Physician/Extender: [name], [name] Weeks in Treatment: 0 Encounter Discharge Information Items Discharge Pain Level: 0 Discharge Condition: Stable Ambulatory Status: Cane Discharge Destination: Home Transportation: Private Auto Accompanied By: wife Schedule Follow-up Appointment: No Medication Reconciliation completed and provided to Patient/Care No Indalecio Malmstrom: Provided on Clinical Summary of Care: 11/27/2015 Form Type Recipient Paper Patient GS Electronic Signature(s) Signed: 11/27/2015 2:24:04 PM By: Gwenlyn Perking Entered By: Gwenlyn Perking  on 11/27/2015 14:24:04 [name] ([id number]) -------------------------------------------------------------------------------- Lower Extremity Assessment Details Patient Name: [name]. Date of Service: 11/27/2015 1:30 PM Medical Record Number: [id number] Patient Account Number: [id number] Date of Birth/Sex: 1945-04-16 (70 y.o. Male) Treating RN: Afful, RN, BSN, Rita Primary Care Physician: [name] Other Clinician: Referring Physician: Etheleen [name] Treating Physician/Extender: [name], [name] Weeks in Treatment: 0 Edema Assessment Assessed: [Left: No] [Right: No] E[Left: dema] [Right: :] Calf Left: Right: Point of Measurement: 35 cm From Medial Instep 40.5 cm 42.6 cm Ankle Left: Right: Point of Measurement: 9 cm From Medial Instep 23 cm 26.5 cm Vascular Assessment Claudication: Claudication Assessment [Left:None] [Right:None] Pulses: Popliteal Palpable: [Left:Yes] [Right:Yes] Posterior Tibial Palpable: [Left:Yes] [Right:Yes] Dorsalis Pedis Palpable: [Left:Yes] [Right:Yes] Extremity colors, hair growth, and conditions: Extremity Color: [Left:Mottled] [Right:Mottled] Hair Growth on Extremity: [Left:No] [Right:No] Temperature of Extremity: [Left:Warm] [Right:Warm] Capillary Refill: [Left:< 3 seconds] [Right:< 3 seconds] Dependent Rubor: [Left:No] [Right:No] Blanched when Elevated: [Left:No] [Right:No] Lipodermatosclerosis: [Left:No] [Right:No] Blood Pressure: Brachial: [Left:133] [Right:133] Dorsalis Pedis: 140 [Left:Dorsalis Pedis: 138] Ankle: Posterior Tibial: 135 [Left:Posterior Tibial: 140 1.05] [Right:1.05] Toe Nail Assessment [name], [name] ([id number]) Left: Right: Thick: Yes Yes Discolored: Yes Yes Deformed: No No Improper Length and Hygiene: No No Electronic Signature(s) Signed: 11/27/2015 5:16:16 PM By: [name] BSN, RN Entered By: [name] on 11/27/2015 13:54:32 Wentz, [name] Kitchen  ([id number]) -------------------------------------------------------------------------------- Multi Wound Chart Details Patient Name: [name] Pen R. Date of Service: 11/27/2015 1:30 PM Medical Record Number: [id number] Patient Account Number: [id number] Date of Birth/Sex: [birth year] (70 y.o. Male) Treating RN: [name] Mealy, RN, BSN, [place] [name] Primary Care Physician: [name] Other Clinician: Referring Physician: Etheleen [name] Treating Physician/Extender: [name], [name] Weeks in Treatment: 0 Vital Signs Height(in): 70 Pulse(bpm): 79 Weight(lbs): 330 Blood Pressure 133/70 (mmHg): Body Mass Index(BMI): 47 Temperature(F): 97.9 Respiratory Rate 18 (breaths/min): Photos: [5:No Photos] [N/A:N/A] Wound Location: [5:Right Malleolus - Lateral] [N/A:N/A] Wounding Event: [5:Gradually Appeared] [N/A:N/A] Primary Etiology: [5:Venous Leg Ulcer] [N/A:N/A] Comorbid History: [5:Anemia, Sleep Apnea, Hypertension] [N/A:N/A] Date Acquired: [5:11/06/2015] [N/A:N/A] Weeks of Treatment: [5:0] [N/A:N/A] Wound Status: [5:Open] [N/A:N/A] Measurements L x W x D 3.7x1.5x0.2 [N/A:N/A] (cm) Area (cm) : [5:4.359] [N/A:N/A] Volume (cm) : [5:0.872] [  N/A:N/A] Classification: [5:Full Thickness Without Exposed Support Structures] [N/A:N/A] Exudate Amount: [5:Medium] [N/A:N/A] Exudate Type: [5:Serosanguineous] [N/A:N/A] Exudate Color: [5:red, brown] [N/A:N/A] Wound Margin: [5:Distinct, outline attached] [N/A:N/A] Granulation Amount: [5:Medium (34-66%)] [N/A:N/A] Granulation Quality: [5:Pink] [N/A:N/A] Necrotic Amount: [5:Small (1-33%)] [N/A:N/A] Exposed Structures: [5:Fascia: No Fat: No Tendon: No Muscle: No Joint: No Bone: No] [N/A:N/A] Limited to Skin Breakdown Epithelialization: None N/A N/A Periwound Skin Texture: Edema: Yes N/A N/A Excoriation: No Induration: No Callus: No Crepitus: No Fluctuance: No Friable: No Rash: No Scarring: No Periwound Skin Moist: Yes N/A  N/A Moisture: Maceration: No Dry/Scaly: No Periwound Skin Color: Atrophie Blanche: No N/A N/A Cyanosis: No Ecchymosis: No Erythema: No Hemosiderin Staining: No Mottled: No Pallor: No Rubor: No Temperature: No Abnormality N/A N/A Tenderness on Yes N/A N/A Palpation: Wound Preparation: Ulcer Cleansing: N/A N/A Rinsed/Irrigated with Saline Topical Anesthetic Applied: Other: lidocaine 4% Treatment Notes Electronic Signature(s) Signed: 11/27/2015 5:16:16 PM By: [name] EricAfful, Rita BSN, RN Entered By: [name] EricAfful, Rita on 11/27/2015 13:56:01 [name] AlmaSHAFAR, [name] ([id number]) -------------------------------------------------------------------------------- Multi-Disciplinary Care Plan Details Patient Name: [name] AlmaSHAFAR, [name] Date of Service: 11/27/2015 1:30 PM Medical Record Number: [id number] Patient Account Number: [id number] Date of Birth/Sex: 08/06/1945 [age] y.o. Male) Treating RN: [name] MealyAfful, RN, BSN, [place] Sink[place] Primary Care Physician: [name] NicksMACDONALD, KERI Other Clinician: Referring Physician: Etheleen NicksMACDONALD, KERI Treating Physician/Extender: [name] DibblesSTONE III, [name] Weeks in Treatment: 0 Active Inactive Orientation to the Wound Care Program Nursing Diagnoses: Knowledge deficit related to the wound healing center program Goals: Patient/caregiver will verbalize understanding of the Wound Healing Center Program Date Initiated: 11/27/2015 Goal Status: Active Interventions: Provide education on orientation to the wound center Notes: Venous Leg Ulcer Nursing Diagnoses: Knowledge deficit related to disease process and management Potential for venous Insuffiency (use before diagnosis confirmed) Goals: Non-invasive venous studies are completed as ordered Date Initiated: 11/27/2015 Goal Status: Active Patient will maintain optimal edema control Date Initiated: 11/27/2015 Goal Status: Active Verify adequate tissue perfusion prior to therapeutic compression application Date Initiated: 11/27/2015 Goal Status:  Active Interventions: Assess peripheral edema status every visit. Compression as ordered Provide education on venous insufficiency [name], Ladislav R. ([id number]) Treatment Activities: Therapeutic compression applied : 11/27/2015 Notes: Wound/Skin Impairment Nursing Diagnoses: Impaired tissue integrity Knowledge deficit related to ulceration/compromised skin integrity Goals: Patient will have a decrease in wound volume by X% from date: (specify in notes) Date Initiated: 11/27/2015 Goal Status: Active Ulcer/skin breakdown will have a volume reduction of 30% by week 4 Date Initiated: 11/27/2015 Goal Status: Active Ulcer/skin breakdown will have a volume reduction of 50% by week 8 Date Initiated: 11/27/2015 Goal Status: Active Ulcer/skin breakdown will have a volume reduction of 80% by week 12 Date Initiated: 11/27/2015 Goal Status: Active Ulcer/skin breakdown will heal within 14 weeks Date Initiated: 11/27/2015 Goal Status: Active Interventions: Assess patient/caregiver ability to obtain necessary supplies Assess patient/caregiver ability to perform ulcer/skin care regimen upon admission and as needed Assess ulceration(s) every visit Provide education on ulcer and skin care Treatment Activities: Skin care regimen initiated : 11/27/2015 Topical wound management initiated : 11/27/2015 Notes: Electronic Signature(s) Signed: 11/27/2015 5:16:16 PM By: [name] EricAfful, Rita BSN, RN Entered By: [name] EricAfful, Rita on 11/27/2015 13:55:44 [name], [name] ([id number]) Charise KillianSHAFAR, [name] ([id number]) -------------------------------------------------------------------------------- Pain Assessment Details Patient Name: [name] AlmaSHAFAR, [name] Date of Service: 11/27/2015 1:30 PM Medical Record Number: [id number] Patient Account Number: [id number] Date of Birth/Sex: 07/08/1945 [age] y.o. Male) Treating RN: [name] MealyAfful, RN, BSN, [place] Sinkita Primary Care Physician: [name] NicksMACDONALD, KERI Other Clinician: Referring Physician: Etheleen NicksMACDONALD,  KERI Treating Physician/Extender: [name] DibblesSTONE III, [name] Weeks in Treatment: 0 Active  Problems Location of Pain Severity and Description of Pain Patient Has Paino Yes Site Locations Pain Location: Pain in Ulcers Rate the pain. Current Pain Level: 4 Character of Pain Describe the Pain: Tender Pain Management and Medication Current Pain Management: How does your pain impact your activities of daily livingo Sleep: No Bathing: No Appetite: No Relationship With Others: No Bladder Continence: No Emotions: No Bowel Continence: No Work: No Toileting: No Drive: No Dressing: No Hobbies: No Electronic Signature(s) Signed: 11/27/2015 5:16:16 PM By: [name] EricAfful, Rita BSN, RN Entered By: [name] EricAfful, Rita on 11/27/2015 13:38:30 [name] AlmaSHAFAR, Tyrese R. ([id number]) -------------------------------------------------------------------------------- Patient/Caregiver Education Details Patient Name: [name] AlmaSHAFAR, Derk R. Date of Service: 11/27/2015 1:30 PM Medical Record Number: [id number] Patient Account Number: [id number] Date of Birth/Gender: 04/05/1945 [age] y.o. Male) Treating RN: [name] MealyAfful, RN, BSN, [place] Sinkita Primary Care Physician: [name] NicksMACDONALD, KERI Other Clinician: Referring Physician: Etheleen NicksMACDONALD, KERI Treating Physician/Extender: Skeet SimmerSTONE III, [name] Weeks in Treatment: 0 Education Assessment Education Provided To: Patient Education Topics Provided Basic Hygiene: Methods: Explain/Verbal Responses: State content correctly Venous: Methods: Explain/Verbal Responses: State content correctly Welcome To The Wound Care Center: Methods: Explain/Verbal Responses: State content correctly Wound/Skin Impairment: Methods: Explain/Verbal Responses: State content correctly Electronic Signature(s) Signed: 11/27/2015 5:16:16 PM By: [name] EricAfful, Rita BSN, RN Entered By: [name] EricAfful, Rita on 11/27/2015 14:24:04 Poffenberger, [name] ([id number]) -------------------------------------------------------------------------------- Wound Assessment  Details Patient Name: [name], Eriel R. Date of Service: 11/27/2015 1:30 PM Medical Record Number: [id number] Patient Account Number: [id number] Date of Birth/Sex: 08/18/1945 [age] y.o. Male) Treating RN: [name] MealyAfful, RN, BSN, Rita Primary Care Physician: [name] NicksMACDONALD, KERI Other Clinician: Referring Physician: Etheleen NicksMACDONALD, KERI Treating Physician/Extender: [name] DibblesSTONE III, [name] Weeks in Treatment: 0 Wound Status Wound Number: 5 Primary Etiology: Venous Leg Ulcer Wound Location: Right Malleolus - Lateral Wound Status: Open Wounding Event: Gradually Appeared Comorbid Anemia, Sleep Apnea, History: Hypertension Date Acquired: 11/06/2015 Weeks Of Treatment: 0 Clustered Wound: No Photos Photo Uploaded By: [name] EricAfful, Rita on 11/27/2015 17:08:45 Wound Measurements Length: (cm) 3.7 Width: (cm) 1.5 Depth: (cm) 0.2 Area: (cm) 4.359 Volume: (cm) 0.872 % Reduction in Area: 0% % Reduction in Volume: 0% Epithelialization: None Tunneling: No Undermining: No Wound Description Full Thickness Without Exposed Classification: Support Structures Wound Margin: Distinct, outline attached Exudate Medium Amount: Exudate Type: Serosanguineous Exudate Color: red, brown Foul Odor After Cleansing: No Wound Bed Granulation Amount: Medium (34-66%) Exposed Structure Granulation Quality: Pink Fascia Exposed: No Necrotic Amount: Small (1-33%) Fat Layer Exposed: No Necrotic Quality: Adherent Slough Tendon Exposed: No Muscle Exposed: No [name], [name] ([id number]) Joint Exposed: No Bone Exposed: No Limited to Skin Breakdown Periwound Skin Texture Texture Color No Abnormalities Noted: No No Abnormalities Noted: No Callus: No Atrophie Blanche: No Crepitus: No Cyanosis: No Excoriation: No Ecchymosis: No Fluctuance: No Erythema: No Friable: No Hemosiderin Staining: Yes Induration: No Mottled: Yes Localized Edema: Yes Pallor: No Rash: No Rubor: No Scarring: No Temperature / Pain Moisture Temperature:  No Abnormality No Abnormalities Noted: No Tenderness on Palpation: Yes Dry / Scaly: No Maceration: No Moist: Yes Wound Preparation Ulcer Cleansing: Rinsed/Irrigated with Saline Topical Anesthetic Applied: Other: lidocaine 4%, Treatment Notes Wound #5 (Right, Lateral Malleolus) 1. Cleansed with: Cleanse wound with antibacterial soap and water 3. Peri-wound Care: Barrier cream 4. Dressing Applied: Aquacel Ag 5. Secondary Dressing Applied ABD Pad 7. Secured with 3 Layer Compression System - Right Lower Extremity Electronic Signature(s) Signed: 11/27/2015 5:16:16 PM By: [name] EricAfful, Rita BSN, RN Entered By: [name] EricAfful, Rita on 11/27/2015 14:03:30 Granzow, [name] ([id number]) -------------------------------------------------------------------------------- Vitals Details Patient Name: [name] AlmaSHAFAR, Jabari R. Date of Service:  11/27/2015 1:30 PM Medical Record Number: [id number] Patient Account Number: [id number] Date of Birth/Sex: 1946-01-03 (70 y.o. Male) Treating RN: Afful, RN, BSN, Rita Primary Care Physician: [name] Other Clinician: Referring Physician: Etheleen [name] Treating Physician/Extender: [name], [name] Weeks in Treatment: 0 Vital Signs Time Taken: 13:38 Temperature (F): 97.9 Height (in): 70 Pulse (bpm): 79 Source: Stated Respiratory Rate (breaths/min): 18 Weight (lbs): 330 Blood Pressure (mmHg): 133/70 Source: Stated Reference Range: 80 - 120 mg / dl Body Mass Index (BMI): 47.3 Electronic Signature(s) Signed: 11/27/2015 5:16:16 PM By: [name] BSN, RN Entered By: [name] on 11/27/2015 13:39:15

## 2015-11-29 NOTE — Progress Notes (Signed)
[name] AlmaSHAFAR, Lavonne R. ([id number]) Visit Report for 11/27/2015 Chief Complaint Document Details Patient Name: [name] AlmaSHAFAR, Rollins R. Date of Service: 11/27/2015 1:30 PM Medical Record Number: [id number] Patient Account Number: [id number] Date of Birth/Sex: 07/01/1945 8(70 y.o. Male) Treating RN: Clover MealyAfful, RN, BSN, [place] Sinkita Primary Care Physician: Etheleen NicksMACDONALD, KERI Other Clinician: Referring Physician: Etheleen NicksMACDONALD, KERI Treating Physician/Extender: Linwood DibblesSTONE III, HOYT Weeks in Treatment: 0 Information Obtained from: Patient Chief Complaint Patient presents for evaluation concerning open wound of his right lateral ankle after a cardboard box fell causing a laceration the first week of October 2017 Electronic Signature(s) Signed: 11/28/2015 1:37:38 AM By: Lenda KelpStone III, Hoyt PA-C Entered By: Lenda KelpStone III, Hoyt on 11/28/2015 01:25:14 [place], [name] ([id number]) -------------------------------------------------------------------------------- Debridement Details Patient Name: [name], Rocket R. Date of Service: 11/27/2015 1:30 PM Medical Record Number: [id number] Patient Account Number: [id number] Date of Birth/Sex: 02/21/1945 30(70 y.o. Male) Treating RN: Clover MealyAfful, RN, BSN, Rita Primary Care Physician: Etheleen NicksMACDONALD, KERI Other Clinician: Referring Physician: Etheleen NicksMACDONALD, KERI Treating Physician/Extender: Linwood DibblesSTONE III, HOYT Weeks in Treatment: 0 Debridement Performed for Wound #5 Right,Lateral Malleolus Assessment: Performed By: Physician STONE III, HOYT E., PA-C Debridement: Debridement Pre-procedure Yes - 14:05 Verification/Time Out Taken: Start Time: 14:05 Pain Control: Lidocaine 4% Topical Solution Level: Skin/Subcutaneous Tissue Total Area Debrided (L x 3.7 (cm) x 1.5 (cm) = 5.55 (cm) W): Tissue and other Non-Viable, Exudate, Fibrin/Slough, Subcutaneous material debrided: Instrument: Curette Bleeding: Minimum Hemostasis Achieved: Pressure End Time: 14:10 Procedural Pain: 0 Post Procedural Pain: 0 Response to  Treatment: Procedure was tolerated well Post Debridement Measurements of Total Wound Length: (cm) 3.7 Width: (cm) 1.5 Depth: (cm) 0.25 Volume: (cm) 1.09 Character of Wound/Ulcer Post Improved Debridement: Severity of Tissue Post Debridement: Fat layer exposed Post Procedure Diagnosis Same as Pre-procedure Electronic Signature(s) Signed: 11/28/2015 1:37:38 AM By: Lenda KelpStone III, Hoyt PA-C Signed: 11/28/2015 5:51:44 PM By: Elpidio EricAfful, Rita BSN, RN Previous Signature: 11/27/2015 5:16:16 PM Version By: Elpidio EricAfful, Rita BSN, RN Entered By: Lenda KelpStone III, Hoyt on 11/28/2015 01:10:43 Yeats, Terril R. ([id number]) Jester, Biff R. ([id number]) -------------------------------------------------------------------------------- HPI Details Patient Name: [name] AlmaSHAFAR, Tully R. Date of Service: 11/27/2015 1:30 PM Medical Record Number: [id number] Patient Account Number: [id number] Date of Birth/Sex: 05/14/1945 104(70 y.o. Male) Treating RN: Clover MealyAfful, RN, BSN, Rita Primary Care Physician: Etheleen NicksMACDONALD, KERI Other Clinician: Referring Physician: Etheleen NicksMACDONALD, KERI Treating Physician/Extender: Linwood DibblesSTONE III, HOYT Weeks in Treatment: 0 History of Present Illness Location: right lower extremity Quality: Patient has been experiencing a burning pain Severity: he relates this to be a 2 out of 10 Duration: Patient has had the wound for approximately 3 weeks prior to presenting for treatment Timing: Pain occurs most often with manipulation of the wound Context: Patient dropped a cardboard box which struck his leg causing the initial injury which hasn't healed since onset Modifying Factors: Currently patient has been using over-the-counter antibiotic ointment Associated Signs and Symptoms: Patient reports having difficulty standing for long periods. HPI Description: As noted above the patient had a injury due to working in the yard and this rapidly progressed to a ulcerated area with an infection. In October 2014 he did have some vascular  studies done but never saw a Careers advisersurgeon and never had any surgery for varicose veins. His past medical history is significant for hypertension, obstructive sleep apnea, morbid obesity, stasis dermatitis of both lower extremities and his past surgical history is suggestive of hip surgery on the right, gastric bypass surgery, cholecystectomy, hip replacement. He has recently been put on Levaquin 500 milligrams daily, Bactrim DS 1 twice a day and Bactroban 2%  topical ointment locally. No recent labs or vascular or imaging studies done. 07/11/2014 a venous duplex study was done on 07/05/2014 -- it showed no evidence of deep vein thrombosis or superficial thrombosis bilaterally. 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 The patient has an appointment to see the vascular surgeons on June 20. 07/18/2014 his appointment with the vascular surgeons is rescheduled for this afternoon. 07/25/2014 -- he saw the vascular surgeon and is going to have a endovenous procedure done on July 29. 08/01/2014 -- he was diagnosed with a UTI and is on ciprofloxacin for 10 days. readmission: 11/27/15 On evaluation today patient is having discomfort in the right ankle region following an open wound from having dropped a cardboard box striking his ankle. He tells me that this has not healed despite many of the measures that he learned from previous injuries when he was seen at our wound center. Therefore he didn't come in for reevaluation today. He has not noted any significant purulent discharge at this point in time. Electronic Signature(s) Signed: 11/28/2015 1:37:38 AM By: Belia Heman, Pharrell R. ([id number]) Entered By: Lenda Kelp on 11/28/2015 01:28:44 [name] ([id number]) -------------------------------------------------------------------------------- Physical Exam Details Patient Name: [name]. Date of  Service: 11/27/2015 1:30 PM Medical Record Number: [id number] Patient Account Number: [id number] Date of Birth/Sex: 06-30-1945 (70 y.o. Male) Treating RN: Clover Mealy, RN, BSN, [place] Sink Primary Care Physician: Etheleen Nicks Other Clinician: Referring Physician: Etheleen Nicks Treating Physician/Extender: STONE III, HOYT Weeks in Treatment: 0 Constitutional Well-nourished and well-hydrated in no acute distress. Respiratory normal breathing without difficulty. clear to auscultation bilaterally. Cardiovascular regular rate and rhythm with normal S1, S2. 2+ dorsalis pedis/posterior tibialis pulses. no clubbing, cyanosis, edema, <3 sec cap refill. Psychiatric this patient is able to make decisions and demonstrates good insight into disease process. Alert and Oriented x 3. pleasant and cooperative. Electronic Signature(s) Signed: 11/28/2015 1:37:38 AM By: Lenda Kelp PA-C Entered By: Lenda Kelp on 11/28/2015 08:65:78 [name] ([id number]) -------------------------------------------------------------------------------- Physician Orders Details Patient Name: [name]. Date of Service: 11/27/2015 1:30 PM Medical Record Number: [id number] Patient Account Number: [id number] Date of Birth/Sex: 06-14-1945 (70 y.o. Male) Treating RN: Clover Mealy, RN, BSN,  Sink Primary Care Physician: Etheleen Nicks Other Clinician: Referring Physician: Etheleen Nicks Treating Physician/Extender: Linwood Dibbles, HOYT Weeks in Treatment: 0 Verbal / Phone Orders: Yes Clinician: Afful, RN, BSN, Rita Read Back and Verified: Yes Diagnosis Coding Wound Cleansing Wound #5 Right,Lateral Malleolus o Clean wound with Normal Saline. o May shower with protection. o No tub bath. Anesthetic Wound #5 Right,Lateral Malleolus o Topical Lidocaine 4% cream applied to wound bed prior to debridement Primary Wound Dressing Wound #5 Right,Lateral Malleolus o Aquacel Ag Secondary Dressing Wound #5 Right,Lateral  Malleolus o ABD pad Dressing Change Frequency Wound #5 Right,Lateral Malleolus o Change dressing every week Follow-up Appointments Wound #5 Right,Lateral Malleolus o Return Appointment in 1 week. Edema Control Wound #5 Right,Lateral Malleolus o 3 Layer Compression System - Right Lower Extremity Additional Orders / Instructions Wound #5 Right,Lateral Malleolus o Increase protein intake. o Activity as tolerated o Other: - MVI, Vit A, C, Zinc KAEVON, COTTA ([id number]) Electronic Signature(s) Signed: 11/27/2015 5:16:16 PM By: Elpidio Eric BSN, RN Signed: 11/28/2015 1:37:38 AM By: Lenda Kelp PA-C Entered By: Elpidio Eric on 11/27/2015 14:09:31 Dearden, Jorje Guild ([id number]) -------------------------------------------------------------------------------- Problem List Details Patient Name: AKIL, HOOS R. Date of Service: 11/27/2015 1:30 PM Medical Record  Number: [id number] Patient Account Number: [id number] Date of Birth/Sex: [birth year] (70 y.o. 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 Date of  Service: 11/27/2015 1:30 PM Medical Record Number: [id number] Patient Account Number: [id number] Date of Birth/Sex: 06/16/1945 (70 y.o. Male) Treating RN: Clover Mealy, RN, BSN, Rita Primary Care Physician: Etheleen Nicks Other Clinician: Referring Physician: Etheleen Nicks Treating Physician/Extender: Linwood Dibbles, HOYT Weeks in Treatment: 0 Subjective Chief Complaint Information obtained from Patient Patient presents for evaluation concerning open wound of his right lateral ankle after a cardboard box fell causing a laceration the first week of October 2017 History of Present Illness (HPI) The following HPI elements were documented for the patient's wound: Location: right lower extremity Quality: Patient has been experiencing a burning pain Severity: he relates this to be a 2 out of 10 Duration: Patient has had the wound for approximately 3 weeks prior to presenting for treatment Timing: Pain occurs most often with manipulation of the wound Context: Patient dropped a cardboard box which struck his leg causing the initial injury which hasn't healed since onset Modifying Factors: Currently patient has been using over-the-counter antibiotic ointment Associated Signs and Symptoms: Patient reports having difficulty standing for long periods. As noted above the patient had a injury due to working in the yard and this rapidly progressed to a ulcerated area with an infection. In October 2014 he did have some vascular studies done but never saw a Careers adviser and never had any surgery for varicose veins. His past medical history is significant for hypertension, obstructive sleep apnea, morbid obesity, stasis dermatitis of both lower extremities and his past surgical history is suggestive of hip surgery on the right, gastric bypass surgery, cholecystectomy, hip replacement. He has recently been put on Levaquin 500 milligrams daily, Bactrim DS 1 twice a day and Bactroban 2% topical ointment locally. No  recent labs or vascular or imaging studies done. 07/11/2014 a venous duplex study was done on 07/05/2014 -- it showed no evidence of deep vein thrombosis or superficial thrombosis bilaterally. 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 The patient has an appointment to see the vascular surgeons on June 20. 07/18/2014 his appointment with the vascular surgeons is rescheduled for this afternoon. 07/25/2014 -- he saw the vascular surgeon and is going to have a endovenous procedure done on July 29. 08/01/2014 -- he was diagnosed with a UTI and is on ciprofloxacin for 10 days. readmission: COPE, MARTE ([id number]) 11/27/15 On evaluation today patient is having discomfort in the right ankle region following an open wound from having dropped a cardboard box striking his ankle. He tells me that this has not healed despite many of the measures that he learned from previous injuries when he was seen at our wound center. Therefore he didn't come in for reevaluation today. He has not noted any significant purulent  discharge at this point in time. Patient History Information obtained from Patient. Allergies aspirin Family History Cancer - Mother, Diabetes - Mother, Heart Disease - Maternal Grandparents, No family history of Hereditary Spherocytosis, Hypertension, Kidney Disease, Lung Disease, Seizures, Stroke, Thyroid Problems, Tuberculosis. Social History Former smoker, Marital Status - Married, Alcohol Use - Rarely, Drug Use - No History, Caffeine Use - Moderate. Medical History Hospitalization/Sur[place] [facility] [place]. [facility] [place]. Review of Systems (ROS) Eyes The patient has no complaints or symptoms. Ear/Nose/Mouth/Throat The patient has no complaints or symptoms. Hematologic/Lymphatic The  patient has no complaints or symptoms. Respiratory The patient has no complaints or symptoms. Cardiovascular Complains or has symptoms of LE edema. Gastrointestinal The patient has no complaints or symptoms. Endocrine The patient has no complaints or symptoms. Genitourinary The patient has no complaints or symptoms. Integumentary (Skin) Complains or has symptoms of Wounds, Breakdown, Swelling. Musculoskeletal The patient has no complaints or symptoms. Neurologic The patient has no complaints or symptoms. [name] AlmaSHAFAR, Kalvin R. ([id number]) Oncologic The patient has no complaints or symptoms. Psychiatric The patient has no complaints or symptoms. Objective Constitutional Well-nourished and well-hydrated in no acute distress. Vitals Time Taken: 1:38 PM, Height: 70 in, Source: Stated, Weight: 330 lbs, Source: Stated, BMI: 47.3, Temperature: 97.9 F, Pulse: 79 bpm, Respiratory Rate: 18 breaths/min, Blood Pressure: 133/70 mmHg. Respiratory normal breathing without difficulty. clear to auscultation bilaterally. Cardiovascular regular rate and rhythm with normal S1, S2. 2+ dorsalis pedis/posterior tibialis pulses. no clubbing, cyanosis, edema, Psychiatric this patient is able to make decisions and demonstrates good insight into disease process. Alert and Oriented x 3. pleasant and cooperative. Integumentary (Hair, Skin) Wound #5 status is Open. Original cause of wound was Gradually Appeared. The wound is located on the Right,Lateral Malleolus. The wound measures 3.7cm length x 1.5cm width x 0.2cm depth; 4.359cm^2 area and 0.872cm^3 volume. The wound is limited to skin breakdown. There is no tunneling or undermining noted. There is a medium amount of serosanguineous drainage noted. The wound margin is distinct with the outline attached to the wound base. There is medium (34-66%) pink granulation within the wound bed. There is a small (1-33%) amount of necrotic tissue within the wound bed  including Adherent Slough. The periwound skin appearance exhibited: Localized Edema, Moist, Hemosiderin Staining, Mottled. The periwound skin appearance did not exhibit: Callus, Crepitus, Excoriation, Fluctuance, Friable, Induration, Rash, Scarring, Dry/Scaly, Maceration, Atrophie Blanche, Cyanosis, Ecchymosis, Pallor, Rubor, Erythema. Periwound temperature was noted as No Abnormality. The periwound has tenderness on palpation. Assessment Active Problems [name] AlmaSHAFAR, Flint R. ([id number]) ICD-10 S81.811A - Laceration without foreign body, right lower leg, initial encounter I87.2 - Venous insufficiency (chronic) (peripheral) L97.312 - Non-pressure chronic ulcer of right ankle with fat layer exposed E66.01 - Morbid (severe) obesity due to excess calories Diagnoses ICD-10 S81.811A: Laceration without foreign body, right lower leg, initial encounter I87.2: Venous insufficiency (chronic) (peripheral) L97.312: Non-pressure chronic ulcer of right ankle with fat layer exposed E66.01: Morbid (severe) obesity due to excess calories Procedures Wound #5 Wound #5 is a Venous Leg Ulcer located on the Right,Lateral Malleolus . There was a Skin/Subcutaneous Tissue Debridement ([id number]) debridement with total area of 5.55 sq cm performed by STONE III, HOYT E., PA-C. with the following instrument(s): Curette to remove Non-Viable tissue/material including Exudate, Fibrin/Slough, and Subcutaneous after achieving pain control using Lidocaine 4% Topical Solution. A time out was conducted at 14:05, prior to the start of the  procedure. A Minimum amount of bleeding was controlled with Pressure. The procedure was tolerated well with a pain level of 0 throughout and a pain level of 0 following the procedure. Post Debridement Measurements: 3.7cm length x 1.5cm width x 0.25cm depth; 1.09cm^3 volume. Character of Wound/Ulcer Post Debridement is improved. Severity of Tissue Post Debridement is: Fat layer exposed. Post  procedure Diagnosis Wound #5: Same as Pre-Procedure Plan Wound Cleansing: Wound #5 Right,Lateral Malleolus: Clean wound with Normal Saline. May shower with protection. No tub bath. Anesthetic: Wound #5 Right,Lateral Malleolus: Topical Lidocaine 4% cream applied to wound bed prior to debridement Primary Wound Dressing: Wound #5 Right,Lateral Malleolus: Iacobucci, Jamin R. ([id number]) Aquacel Ag Secondary Dressing: Wound #5 Right,Lateral Malleolus: ABD pad Dressing Change Frequency: Wound #5 Right,Lateral Malleolus: Change dressing every week Follow-up Appointments: Wound #5 Right,Lateral Malleolus: Return Appointment in 1 week. Edema Control: Wound #5 Right,Lateral Malleolus: 3 Layer Compression System - Right Lower Extremity Additional Orders / Instructions: Wound #5 Right,Lateral Malleolus: Increase protein intake. Activity as tolerated Other: - MVI, Vit A, C, Zinc Follow-Up Appointments: A Patient Clinical Summary of Care was provided to GS At this point in time I'm going to recommend that we initiate Aquacel Ag patient's wound. He tolerated the debridement very well today and I was able to debride this down to decently did tissue in my opinion. He did have bleeding controlled with pressure. Coupled with the aqua cell we will cover this with the ABD past and I am ordering a 3 layer compression which I do think will be beneficial for him as well. We will see him for reevaluation in one week to see where things stand at that point in time. He is in agreement with this plan. If he has any other concerns or questions in the meantime he will contact the office and let me know. ootherwise all questions and concerns were answered to the best my ability today patient was discharged in stable condition. Electronic Signature(s) Signed: 11/28/2015 1:37:38 AM By: Lenda Kelp PA-C Entered By: Lenda Kelp on 11/28/2015 01:35:57 Asa, Jorje Guild  ([id number]) -------------------------------------------------------------------------------- ROS/PFSH Details Patient Name: [name]. Date of Service: 11/27/2015 1:30 PM Medical Record Number: [id number] Patient Account Number: [id number] Date of Birth/Sex: [birth year] (70 y.o. Male) Treating RN: Clover Mealy, RN, BSN, [place] Sink Primary Care Physician: Etheleen Nicks Other Clinician: Referring Physician: Etheleen Nicks Treating Physician/Extender: Linwood Dibbles, HOYT Weeks in Treatment: 0 Information Obtained From Patient Wound History Cardiovascular Complaints and Symptoms: Positive for: LE edema Medical History: Positive for: Hypertension Negative for: Angina; Arrhythmia; Congestive Heart Failure; Coronary Artery Disease; Deep Vein Thrombosis; Hypotension; Myocardial Infarction; Peripheral Arterial Disease; Peripheral Venous Disease; Phlebitis; Vasculitis Integumentary (Skin) Complaints and Symptoms: Positive for: Wounds; Breakdown; Swelling Medical History: Negative for: History of Burn; History of pressure wounds Eyes Complaints and Symptoms: No Complaints or Symptoms Medical History: Negative for: Cataracts; Glaucoma; Optic Neuritis Ear/Nose/Mouth/Throat Complaints and Symptoms: No Complaints or Symptoms Medical History: Negative for: Chronic sinus problems/congestion; Middle ear problems Hematologic/Lymphatic Complaints and Symptoms: No Complaints or Symptoms Thomann, Zayvion R. ([id number]) Medical History: Positive for: Anemia - iron pills Negative for: Hemophilia; Human Immunodeficiency Virus; Lymphedema; Sickle Cell Disease Respiratory Complaints and Symptoms: No Complaints or Symptoms Medical History: Positive for: Sleep Apnea - C-pap and oxygen 1.5L at night Negative for: Aspiration; Asthma; Chronic Obstructive Pulmonary Disease (COPD); Pneumothorax; Tuberculosis Gastrointestinal Complaints and Symptoms: No Complaints or Symptoms Medical History: Negative for:  Cirrhosis ; Colitis; Crohnos; Hepatitis A; Hepatitis B; Hepatitis C Endocrine Complaints  and Symptoms: No Complaints or Symptoms Medical History: Negative for: Type I Diabetes; Type II Diabetes Genitourinary Complaints and Symptoms: No Complaints or Symptoms Medical History: Negative for: End Stage Renal Disease Immunological Medical History: Negative for: Lupus Erythematosus; Raynaudos; Scleroderma Musculoskeletal Complaints and Symptoms: No Complaints or Symptoms Medical History: Negative for: Gout; Rheumatoid Arthritis; Osteoarthritis; Osteomyelitis Neurologic Bleicher, Jamarius R. ([id number]) Complaints and Symptoms: No Complaints or Symptoms Medical History: Negative for: Dementia; Neuropathy; Quadriplegia; Paraplegia; Seizure Disorder Oncologic Complaints and Symptoms: No Complaints or Symptoms Medical History: Negative for: Received Chemotherapy; Received Radiation Psychiatric Complaints and Symptoms: No Complaints or Symptoms Medical History: Negative for: Anorexia/bulimia; Confinement Anxiety Immunizations Pneumococcal Vaccine: Received Pneumococcal Vaccination: No Tetanus Vaccine: Last tetanus shot: 06/28/2011 Hospitalization / Surgery History Name of Hospital Purpose of Hospitalization/Surgery Date [place] cellulitis [facility] [place] [facility] 07/27/2005 [facility] [place] hip replacement 07/28/2003 [facility] cellulitis 06/30/2014 Family and Social History Cancer: Yes - Mother; Diabetes: Yes - Mother; Heart Disease: Yes - Maternal Grandparents; Hereditary Spherocytosis: No; Hypertension: No; Kidney Disease: No; Lung Disease: No; Seizures: No; Stroke: No; Thyroid Problems: No; Tuberculosis: No; Former smoker; Marital Status - Married; Alcohol Use: Rarely; Drug Use: No History; Caffeine Use: Moderate; Financial Concerns: No; Food, Clothing or Shelter Needs: No; Support System Lacking: No; Transportation Concerns: No; Advanced Directives: Yes (Not Provided); Patient does not  want information on Advanced Directives; Living Will: Yes (Not Provided); Medical Power of Attorney: Yes (Not Provided) Electronic Signature(s) Signed: 11/27/2015 5:16:16 PM By: Elpidio Eric BSN, RN Signed: 11/28/2015 1:37:38 AM By: Lenda Kelp PA-C Entered By: Elpidio Eric on 11/27/2015 13:42:00 Engelhard, Jorje Guild ([id number]) Stadler, Jorje Guild ([id number]) -------------------------------------------------------------------------------- SuperBill Details Patient Name: [name] Date of Service: 11/27/2015 Medical Record Number: [id number] Patient Account Number: [id number] Date of Birth/Sex: [birth year] (70 y.o. 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 Morbid (severe) obesity due to excess calories Facility Procedures CPT4 Code Description: 13244010 99213 - WOUND CARE VISIT-LEV 3 EST PT Modifier: Quantity: 1 CPT4 Code Description: 27253664 11042 - DEB SUBQ TISSUE 20 SQ CM/< ICD-10 Description Diagnosis L97.312 Non-pressure chronic ulcer of right ankle with fat lay Modifier: er exposed Quantity: 1 Physician Procedures CPT4 Code Description: 4034742 99213 - WC PHYS LEVEL 3 - EST PT ICD-10 Description Diagnosis S81.811A Laceration without foreign body, right lower leg, init 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 ([id number]) Entered By: Lenda Kelp on 11/28/2015 01:33:42

## 2015-12-04 ENCOUNTER — Encounter: Payer: Medicare Other | Attending: Physician Assistant | Admitting: Physician Assistant

## 2015-12-04 DIAGNOSIS — Z6841 Body Mass Index (BMI) 40.0 and over, adult: Secondary | ICD-10-CM | POA: Diagnosis not present

## 2015-12-04 DIAGNOSIS — I872 Venous insufficiency (chronic) (peripheral): Secondary | ICD-10-CM | POA: Insufficient documentation

## 2015-12-04 DIAGNOSIS — S81811S Laceration without foreign body, right lower leg, sequela: Secondary | ICD-10-CM | POA: Diagnosis present

## 2015-12-04 DIAGNOSIS — I10 Essential (primary) hypertension: Secondary | ICD-10-CM | POA: Diagnosis not present

## 2015-12-04 DIAGNOSIS — L97312 Non-pressure chronic ulcer of right ankle with fat layer exposed: Secondary | ICD-10-CM | POA: Diagnosis not present

## 2015-12-04 DIAGNOSIS — X58XXXS Exposure to other specified factors, sequela: Secondary | ICD-10-CM | POA: Insufficient documentation

## 2015-12-04 DIAGNOSIS — G4733 Obstructive sleep apnea (adult) (pediatric): Secondary | ICD-10-CM | POA: Diagnosis not present

## 2015-12-05 NOTE — Progress Notes (Signed)
[name], [name] ([id number]) Visit Report for 12/04/2015 Arrival Information Details Patient Name: [name], [name] Date of Service: 12/04/2015 2:15 PM Medical Record Number: [id number] Patient Account Number: [id number] Date of Birth/Sex: 07/29/1945 15(70 y.o. Male) Treating RN: Clover MealyAfful, RN, BSN, [place] Sinkita Primary Care Physician: Etheleen NicksMACDONALD, KERI Other Clinician: Referring Physician: Etheleen NicksMACDONALD, KERI Treating Physician/Extender: Linwood DibblesSTONE III, HOYT Weeks in Treatment: 1 Visit Information History Since Last Visit All ordered tests and consults were completed: No Patient Arrived: Gilmer MorCane Added or deleted any medications: No Arrival Time: 14:11 Any new allergies or adverse reactions: No Accompanied By: wife Had a fall or experienced change in No Transfer Assistance: None activities of daily living that may affect Patient Identification Verified: Yes risk of falls: Secondary Verification Process Completed: Yes Signs or symptoms of abuse/neglect since last No Patient Requires Transmission-Based No visito Precautions: Hospitalized since last visit: No Patient Has Alerts: No Has Dressing in Place as Prescribed: Yes Has Compression in Place as Prescribed: Yes Pain Present Now: No Electronic Signature(s) Signed: 12/04/2015 4:37:25 PM By: Elpidio EricAfful, Rita BSN, RN Entered By: Elpidio EricAfful, Rita on 12/04/2015 14:12:14 [name], [name] -------------------------------------------------------------------------------- Encounter Discharge Information Details Patient Name: [name], [name] Date of Service: 12/04/2015 2:15 PM Medical Record Number: [id number] Patient Account Number: [id number] Date of Birth/Sex: 03/31/1945 [age] y.o. Male) Treating RN: Clover MealyAfful, RN, BSN, [place] Sinkita Primary Care Physician: Etheleen NicksMACDONALD, KERI Other Clinician: Referring Physician: Etheleen NicksMACDONALD, KERI Treating Physician/Extender: Linwood DibblesSTONE III, HOYT Weeks in Treatment: 1 Encounter Discharge Information Items Discharge Pain Level: 0 Discharge  Condition: Stable Ambulatory Status: Cane Discharge Destination: Home Transportation: Private Auto Accompanied By: wife Schedule Follow-up Appointment: No Medication Reconciliation completed No and provided to Patient/Care [name] Provided on Clinical Summary of Care: 12/04/2015 Form Type Recipient Paper Patient GS Electronic Signature(s) Signed: 12/04/2015 2:54:42 PM By: Gwenlyn PerkingMoore, Shelia Entered By: Gwenlyn PerkingMoore, Shelia on 12/04/2015 14:54:42 [name], [name] ([id number]) -------------------------------------------------------------------------------- Lower Extremity Assessment Details Patient Name: [name], [name] Date of Service: 12/04/2015 2:15 PM Medical Record Number: [id number] Patient Account Number: [id number] Date of Birth/Sex: 06/24/1945 [age] y.o. Male) Treating RN: Clover MealyAfful, RN, BSN, [place] Sinkita Primary Care Physician: Etheleen NicksMACDONALD, KERI Other Clinician: Referring Physician: Etheleen NicksMACDONALD, KERI Treating Physician/Extender: Linwood DibblesSTONE III, HOYT Weeks in Treatment: 1 Edema Assessment Assessed: [Left: No] [Right: No] Edema: [Left: Ye] [Right: s] Calf Left: Right: Point of Measurement: 35 cm From Medial Instep cm 42.2 cm Ankle Left: Right: Point of Measurement: 9 cm From Medial Instep cm 26.2 cm Vascular Assessment Claudication: Claudication Assessment [Right:None] Pulses: Posterior Tibial Dorsalis Pedis Palpable: [Right:Yes] Extremity colors, hair growth, and conditions: Extremity Color: [Right:Hyperpigmented] Hair Growth on Extremity: [Right:No] Temperature of Extremity: [Right:Warm] Capillary Refill: [Right:< 3 seconds] Electronic Signature(s) Signed: 12/04/2015 4:37:25 PM By: Elpidio EricAfful, Rita BSN, RN Entered By: Elpidio EricAfful, Rita on 12/04/2015 14:16:09 [name], [name] ([id number]) -------------------------------------------------------------------------------- Multi Wound Chart Details Patient Name: [name], [name] Date of Service: 12/04/2015 2:15 PM Medical Record Number: [id number] Patient  Account Number: [id number] Date of Birth/Sex: 04/10/1945 42(70 y.o. Male) Treating RN: Clover MealyAfful, RN, BSN, [place] Sinkita Primary Care Physician: Etheleen NicksMACDONALD, KERI Other Clinician: Referring Physician: Etheleen NicksMACDONALD, KERI Treating Physician/Extender: Linwood DibblesSTONE III, HOYT Weeks in Treatment: 1 Vital Signs Height(in): 70 Pulse(bpm): 78 Weight(lbs): 330 Blood Pressure 146/86 (mmHg): Body Mass Index(BMI): 47 Temperature(F): 97.7 Respiratory Rate 18 (breaths/min): Photos: [5:No Photos] [N/A:N/A] Wound Location: [5:Right Malleolus - Lateral] [N/A:N/A] Wounding Event: [5:Gradually Appeared] [N/A:N/A] Primary Etiology: [5:Venous Leg Ulcer] [N/A:N/A] Comorbid History: [5:Anemia, Sleep Apnea, Hypertension] [N/A:N/A] Date Acquired: [5:11/06/2015] [N/A:N/A] Weeks of Treatment: [5:1] [N/A:N/A] Wound Status: [5:Open] [N/A:N/A] Measurements L x W  x D 3.5x1.4x0.2 [N/A:N/A] (cm) Area (cm) : [5:3.848] [N/A:N/A] Volume (cm) : [5:0.77] [N/A:N/A] % Reduction in Area: [5:11.70%] [N/A:N/A] % Reduction in Volume: 11.70% [N/A:N/A] Classification: [5:Full Thickness Without Exposed Support Structures] [N/A:N/A] Exudate Amount: [5:Medium] [N/A:N/A] Exudate Type: [5:Serosanguineous] [N/A:N/A] Exudate Color: [5:red, brown] [N/A:N/A] Wound Margin: [5:Distinct, outline attached] [N/A:N/A] Granulation Amount: [5:Large (67-100%)] [N/A:N/A] Granulation Quality: [5:Pink] [N/A:N/A] Necrotic Amount: [5:Small (1-33%)] [N/A:N/A] Exposed Structures: [5:Fascia: No Fat: No Tendon: No Muscle: No Joint: No] [N/A:N/A] Bone: No Limited to Skin Breakdown Epithelialization: None N/A N/A Periwound Skin Texture: Edema: Yes N/A N/A Excoriation: No Induration: No Callus: No Crepitus: No Fluctuance: No Friable: No Rash: No Scarring: No Periwound Skin Moist: Yes N/A N/A Moisture: Maceration: No Dry/Scaly: No Periwound Skin Color: Hemosiderin Staining: Yes N/A N/A Mottled: Yes Atrophie Blanche: No Cyanosis: No Ecchymosis:  No Erythema: No Pallor: No Rubor: No Temperature: No Abnormality N/A N/A Tenderness on Yes N/A N/A Palpation: Wound Preparation: Ulcer Cleansing: N/A N/A Rinsed/Irrigated with Saline, Other: surg scrub with water Topical Anesthetic Applied: Other: lidocaine 4% Treatment Notes Electronic Signature(s) Signed: 12/04/2015 4:37:25 PM By: Elpidio Eric BSN, RN Entered By: Elpidio Eric on 12/04/2015 14:25:48 [name] Alma ([id number]) -------------------------------------------------------------------------------- Multi-Disciplinary Care Plan Details Patient Name: [name] Alma. Date of Service: 12/04/2015 2:15 PM Medical Record Number: [id number] Patient Account Number: [id number] Date of Birth/Sex: 1945-03-17 (70 y.o. Male) Treating RN: Clover Mealy, RN, BSN,  Sink Primary Care Physician: Etheleen Nicks Other Clinician: Referring Physician: Etheleen Nicks Treating Physician/Extender: Linwood Dibbles, HOYT Weeks in Treatment: 1 Active Inactive Orientation to the Wound Care Program Nursing Diagnoses: Knowledge deficit related to the wound healing center program Goals: Patient/caregiver will verbalize understanding of the Wound Healing Center Program Date Initiated: 11/27/2015 Goal Status: Active Interventions: Provide education on orientation to the wound center Notes: Venous Leg Ulcer Nursing Diagnoses: Knowledge deficit related to disease process and management Potential for venous Insuffiency (use before diagnosis confirmed) Goals: Non-invasive venous studies are completed as ordered Date Initiated: 11/27/2015 Goal Status: Active Patient will maintain optimal edema control Date Initiated: 11/27/2015 Goal Status: Active Verify adequate tissue perfusion prior to therapeutic compression application Date Initiated: 11/27/2015 Goal Status: Active Interventions: Assess peripheral edema status every visit. Compression as ordered Provide education on venous insufficiency [name],  [name] ([id number]) Treatment Activities: Therapeutic compression applied : 11/27/2015 Notes: Wound/Skin Impairment Nursing Diagnoses: Impaired tissue integrity Knowledge deficit related to ulceration/compromised skin integrity Goals: Patient will have a decrease in wound volume by X% from date: (specify in notes) Date Initiated: 11/27/2015 Goal Status: Active Ulcer/skin breakdown will have a volume reduction of 30% by week 4 Date Initiated: 11/27/2015 Goal Status: Active Ulcer/skin breakdown will have a volume reduction of 50% by week 8 Date Initiated: 11/27/2015 Goal Status: Active Ulcer/skin breakdown will have a volume reduction of 80% by week 12 Date Initiated: 11/27/2015 Goal Status: Active Ulcer/skin breakdown will heal within 14 weeks Date Initiated: 11/27/2015 Goal Status: Active Interventions: Assess patient/caregiver ability to obtain necessary supplies Assess patient/caregiver ability to perform ulcer/skin care regimen upon admission and as needed Assess ulceration(s) every visit Provide education on ulcer and skin care Treatment Activities: Skin care regimen initiated : 11/27/2015 Topical wound management initiated : 11/27/2015 Notes: Electronic Signature(s) Signed: 12/04/2015 4:37:25 PM By: Elpidio Eric BSN, RN Entered By: Elpidio Eric on 12/04/2015 14:25:32 [place], Jorje Guild ([id number]) [name], [name] ([id number]) -------------------------------------------------------------------------------- Pain Assessment Details Patient Name: [name] Alma. Date of Service: 12/04/2015 2:15 PM Medical Record Number: [id number] Patient Account Number: [id number] Date of Birth/Sex: [birth year] (  70 y.o. Male) Treating RN: Afful, RN, BSN, [place] Sink Primary Care Physician: Etheleen Nicks Other Clinician: Referring Physician: Etheleen Nicks Treating Physician/Extender: Linwood Dibbles, HOYT Weeks in Treatment: 1 Active Problems Location of Pain Severity and Description of  Pain Patient Has Paino No Site Locations With Dressing Change: No Pain Management and Medication Current Pain Management: Electronic Signature(s) Signed: 12/04/2015 4:37:25 PM By: Elpidio Eric BSN, RN Entered By: Elpidio Eric on 12/04/2015 14:15:31 Fahey, Jorje Guild ([id number]) -------------------------------------------------------------------------------- Patient/Caregiver Education Details Patient Name: [name] Alma. Date of Service: 12/04/2015 2:15 PM Medical Record Number: [id number] Patient Account Number: [id number] Date of Birth/Gender: [birth year] (71 y.o. Male) Treating RN: Clover Mealy, RN, BSN, [place] Sink Primary Care Physician: Etheleen Nicks Other Clinician: Referring Physician: Etheleen Nicks Treating Physician/Extender: Skeet Simmer in Treatment: 1 Education Assessment Education Provided To: Patient Education Topics Provided Venous: Methods: Explain/Verbal Responses: State content correctly Welcome To The Wound Care Center: Methods: Explain/Verbal Responses: State content correctly Wound/Skin Impairment: Methods: Explain/Verbal Responses: State content correctly Electronic Signature(s) Signed: 12/04/2015 4:37:25 PM By: Elpidio Eric BSN, RN Entered By: Elpidio Eric on 12/04/2015 14:43:38 Bergey, Jorje Guild ([id number]) -------------------------------------------------------------------------------- Wound Assessment Details Patient Name: [name] Pen R. Date of Service: 12/04/2015 2:15 PM Medical Record Number: [id number] Patient Account Number: [id number] Date of Birth/Sex: [birth year] (69 y.o. Male) Treating RN: Clover Mealy, RN, BSN, Rita Primary Care Physician: Etheleen Nicks Other Clinician: Referring Physician: Etheleen Nicks Treating Physician/Extender: Linwood Dibbles, HOYT Weeks in Treatment: 1 Wound Status Wound Number: 5 Primary Etiology: Venous Leg Ulcer Wound Location: Right Malleolus - Lateral Wound Status: Open Wounding Event: Gradually Appeared Comorbid  Anemia, Sleep Apnea, History: Hypertension Date Acquired: 11/06/2015 Weeks Of Treatment: 1 Clustered Wound: No Photos Photo Uploaded By: Elpidio Eric on 12/04/2015 16:31:18 Wound Measurements Length: (cm) 3.5 % Reduction in Width: (cm) 1.4 % Reduction in Depth: (cm) 0.2 Epithelializat Area: (cm) 3.848 Tunneling: Volume: (cm) 0.77 Undermining: Area: 11.7% Volume: 11.7% ion: None No No Wound Description Full Thickness Without Exposed Classification: Support Structures Wound Margin: Distinct, outline attached Exudate Medium Amount: Exudate Type: Serosanguineous Exudate Color: red, brown Foul Odor After Cleansing: No Wound Bed Granulation Amount: Large (67-100%) Exposed Structure Granulation Quality: Pink Fascia Exposed: No Necrotic Amount: Small (1-33%) Fat Layer Exposed: No [place], Mikhi R. ([id number]) Necrotic Quality: Adherent Slough Tendon Exposed: No Muscle Exposed: No Joint Exposed: No Bone Exposed: No Limited to Skin Breakdown Periwound Skin Texture Texture Color No Abnormalities Noted: No No Abnormalities Noted: No Callus: No Atrophie Blanche: No Crepitus: No Cyanosis: No Excoriation: No Ecchymosis: No Fluctuance: No Erythema: No Friable: No Hemosiderin Staining: Yes Induration: No Mottled: Yes Localized Edema: Yes Pallor: No Rash: No Rubor: No Scarring: No Temperature / Pain Moisture Temperature: No Abnormality No Abnormalities Noted: No Tenderness on Palpation: Yes Dry / Scaly: No Maceration: No Moist: Yes Wound Preparation Ulcer Cleansing: Rinsed/Irrigated with Saline, Other: surg scrub with water, Topical Anesthetic Applied: Other: lidocaine 4%, Treatment Notes Wound #5 (Right, Lateral Malleolus) 1. Cleansed with: Cleanse wound with antibacterial soap and water 3. Peri-wound Care: Barrier cream Moisturizing lotion 4. Dressing Applied: Aquacel Ag 5. Secondary Dressing Applied ABD Pad 7. Secured with 3 Layer Compression  System - Right Lower Extremity Electronic Signature(s) Signed: 12/04/2015 4:37:25 PM By: Elpidio Eric BSN, RN Entered By: Elpidio Eric on 12/04/2015 14:25:24 [name] Alma ([id number]) [name], Jorje Guild ([id number]) -------------------------------------------------------------------------------- Vitals Details Patient Name: [name] Alma. Date of Service: 12/04/2015 2:15 PM Medical Record Number: [id number] Patient Account Number: [id number] Date of Birth/Sex: [birth year] (70 y.o. Male)  Treating RN: Afful, RN, BSN, Rita Primary Care Physician: Etheleen NicksMACDONALD, KERI Other Clinician: Referring Physician: Etheleen NicksMACDONALD, KERI Treating Physician/Extender: Linwood DibblesSTONE III, HOYT Weeks in Treatment: 1 Vital Signs Time Taken: 14:17 Temperature (F): 97.7 Height (in): 70 Pulse (bpm): 78 Weight (lbs): 330 Respiratory Rate (breaths/min): 18 Body Mass Index (BMI): 47.3 Blood Pressure (mmHg): 146/86 Reference Range: 80 - 120 mg / dl Electronic Signature(s) Signed: 12/04/2015 4:37:25 PM By: Elpidio EricAfful, Rita BSN, RN Entered By: Elpidio EricAfful, Rita on 12/04/2015 14:18:20

## 2015-12-06 NOTE — Progress Notes (Signed)
AMEET, SANDY ([id number]) Visit Report for 12/04/2015 Chief Complaint Document Details Patient Name: [name], [name]. Date of Service: 12/04/2015 2:15 PM Medical Record Number: [id number] Patient Account Number: [id number] Date of Birth/Sex: [birth year] (70 y.o. Male) Treating RN: Clover Mealy, RN, BSN, [place] Primary Care Physician: Etheleen Nicks Other Clinician: Referring Physician: Etheleen Nicks Treating Physician/Extender: Linwood Dibbles, HOYT Weeks in Treatment: 1 Information Obtained from: Patient Chief Complaint Patient presents for evaluation concerning open wound of his right lateral ankle after a cardboard box fell causing a laceration the first week of October 2017 Electronic Signature(s) Signed: 12/05/2015 12:52:14 AM By: Lenda Kelp PA-C Entered By: Lenda Kelp on 12/04/2015 19:42:58 [name], [name] ([id number]) -------------------------------------------------------------------------------- Debridement Details Patient Name: [name] Pen R. Date of Service: 12/04/2015 2:15 PM Medical Record Number: [id number] Patient Account Number: [id number] Date of Birth/Sex: 1945-02-16 (70 y.o. Male) Treating RN: Afful, RN, BSN, Rita Primary Care Physician: Etheleen Nicks Other Clinician: Referring Physician: Etheleen Nicks Treating Physician/Extender: Linwood Dibbles, HOYT Weeks in Treatment: 1 Debridement Performed for Wound #5 Right,Lateral Malleolus Assessment: Performed By: Physician STONE III, HOYT E., PA-C Debridement: Debridement Pre-procedure Yes - 14:39 Verification/Time Out Taken: Start Time: 14:39 Pain Control: Lidocaine 4% Topical Solution Level: Skin/Subcutaneous Tissue Total Area Debrided (L x 3.5 (cm) x 1.4 (cm) = 4.9 (cm) W): Tissue and other Non-Viable, Fibrin/Slough, Subcutaneous material debrided: Instrument: Curette Bleeding: Minimum Hemostasis Achieved: Pressure End Time: 14:44 Procedural Pain: 0 Post Procedural Pain: 0 Response to Treatment: Procedure  was tolerated well Post Debridement Measurements of Total Wound Length: (cm) 3.5 Width: (cm) 1.4 Depth: (cm) 0.25 Volume: (cm) 0.962 Character of Wound/Ulcer Post Requires Further Debridement Debridement: Severity of Tissue Post Debridement: Fat layer exposed Post Procedure Diagnosis Same as Pre-procedure Electronic Signature(s) Signed: 12/05/2015 12:52:14 AM By: Lenda Kelp PA-C Signed: 12/05/2015 5:08:11 PM By: Elpidio Eric BSN, RN Previous Signature: 12/04/2015 4:37:25 PM Version By: Elpidio Eric BSN, RN Entered By: Lenda Kelp on 12/04/2015 19:42:44 [name], [name] ([id number]) [name], [name] ([id number]) -------------------------------------------------------------------------------- HPI Details Patient Name: [name] Alma. Date of Service: 12/04/2015 2:15 PM Medical Record Number: [id number] Patient Account Number: [id number] Date of Birth/Sex: [birth year] (70 y.o. Male) Treating RN: Clover Mealy, RN, BSN, Rita Primary Care Physician: Etheleen Nicks Other Clinician: Referring Physician: Etheleen Nicks Treating Physician/Extender: Linwood Dibbles, HOYT Weeks in Treatment: 1 History of Present Illness Location: right lower extremity Quality: Patient has been experiencing much less pain currently Severity: 1 out of 10 Duration: Patient has had the wound for approximately 3 weeks prior to presenting for treatment Timing: Pain occurs most often with manipulation of the wound Context: Patient dropped a cardboard box which struck his leg causing the initial injury which hasn't healed since onset Modifying Factors: Currently patient has been using over-the-counter antibiotic ointment Associated Signs and Symptoms: Patient reports having difficulty standing for long periods. HPI Description: As noted above the patient had a injury due to working in the yard and this rapidly progressed to a ulcerated area with an infection. In October 2014 he did have some vascular studies done but  never saw a Careers adviser and never had any surgery for varicose veins. His past medical history is significant for hypertension, obstructive sleep apnea, morbid obesity, stasis dermatitis of both lower extremities and his past surgical history is suggestive of hip surgery on the right, gastric bypass surgery, cholecystectomy, hip replacement. He has recently been put on Levaquin 500 milligrams daily, Bactrim DS 1 twice a day and Bactroban 2% topical ointment locally. No  recent labs or vascular or imaging studies done. 07/11/2014 a venous duplex study was done on 07/05/2014 -- it showed no evidence of deep vein thrombosis or superficial thrombosis bilaterally. 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 The patient has an appointment to see the vascular surgeons on June 20. 07/18/2014 his appointment with the vascular surgeons is rescheduled for this afternoon. 07/25/2014 -- he saw the vascular surgeon and is going to have a endovenous procedure done on July 29. 08/01/2014 -- he was diagnosed with a UTI and is on ciprofloxacin for 10 days. readmission: 11/27/15 On evaluation today patient is having discomfort in the right ankle region following an open wound from having dropped a cardboard box striking his ankle. He tells me that this has not healed despite many of the measures that he learned from previous injuries when he was seen at our wound center. Therefore he didn't come in for reevaluation today. He has not noted any significant purulent discharge at this point in time. 12/14/15 patient appears to be doing somewhat better at this point in time today in regard to his ankle wound. it is measuring smaller and looking cleaner. [name], [name] ([id number]) Electronic Signature(s) Signed: 12/05/2015 12:52:14 AM By: Lenda Kelp PA-C Entered By: Lenda Kelp on 12/04/2015 19:44:50 [name], [name]  ([id number]) -------------------------------------------------------------------------------- Physical Exam Details Patient Name: [name] Alma. Date of Service: 12/04/2015 2:15 PM Medical Record Number: [id number] Patient Account Number: [id number] Date of Birth/Sex: 11/08/1945 (70 y.o. Male) Treating RN: Clover Mealy, RN, BSN, [place] Sink Primary Care Physician: Etheleen Nicks Other Clinician: Referring Physician: Etheleen Nicks Treating Physician/Extender: STONE III, HOYT Weeks in Treatment: 1 Constitutional Well-nourished and well-hydrated in no acute distress. Respiratory normal breathing without difficulty. Psychiatric this patient is able to make decisions and demonstrates good insight into disease process. Alert and Oriented x 3. pleasant and cooperative. Notes Patient's wound does still have necrotic slough noted over the surface that required debridement today. He tolerated debridement well without complication. There wasn't much better wound bed post debridement. This does seem to be improving and was smaller compared to last week. Electronic Signature(s) Signed: 12/05/2015 12:52:14 AM By: Lenda Kelp PA-C Entered By: Lenda Kelp on 12/04/2015 19:46:02 [name], [name] ([id number]) -------------------------------------------------------------------------------- Physician Orders Details Patient Name: [name] Alma. Date of Service: 12/04/2015 2:15 PM Medical Record Number: [id number] Patient Account Number: [id number] Date of Birth/Sex: [birth year] (70 y.o. Male) Treating RN: Clover Mealy, RN, BSN, [place] Sink Primary Care Physician: Etheleen Nicks Other Clinician: Referring Physician: Etheleen Nicks Treating Physician/Extender: Linwood Dibbles, HOYT Weeks in Treatment: 1 Verbal / Phone Orders: Yes Clinician: Afful, RN, BSN, [place] Read Back and Verified: Yes Diagnosis Coding Wound Cleansing Wound #5 Right,Lateral Malleolus o Clean wound with Normal Saline. o May shower with  protection. o No tub bath. Anesthetic Wound #5 Right,Lateral Malleolus o Topical Lidocaine 4% cream applied to wound bed prior to debridement Primary Wound Dressing Wound #5 Right,Lateral Malleolus o Aquacel Ag Secondary Dressing Wound #5 Right,Lateral Malleolus o ABD pad Dressing Change Frequency Wound #5 Right,Lateral Malleolus o Change dressing every week Follow-up Appointments Wound #5 Right,Lateral Malleolus o Return Appointment in 1 week. Edema Control Wound #5 Right,Lateral Malleolus o 3 Layer Compression System - Right Lower Extremity Additional Orders / Instructions Wound #5 Right,Lateral Malleolus o Increase protein intake. o Activity as tolerated o Other: - MVI, Vit A, C, Zinc [name], [name] ([id number]) Electronic Signature(s) Signed: 12/04/2015 4:37:25 PM By: Clover Mealy,  [place] Sink BSN, RN Signed: 12/05/2015 12:52:14 AM By: Lenda Kelp PA-C Entered By: Elpidio Eric on 12/04/2015 14:42:31 Hofferber, [name] ([id number]) -------------------------------------------------------------------------------- Problem List Details Patient Name: [name] Alma. Date of Service: 12/04/2015 2:15 PM Medical Record Number: [id number] Patient Account Number: [id number] Date of Birth/Sex: [birth year] (70 y.o. 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 ([id number]) -------------------------------------------------------------------------------- Progress Note Details Patient Name: [name] Alma. Date of Service: 12/04/2015 2:15 PM Medical Record Number: [id number] Patient Account Number: [id number] Date of Birth/Sex: [birth year] (70 y.o. Male) Treating RN: Clover Mealy, RN, BSN, [place] Primary Care Physician: Etheleen Nicks Other Clinician: Referring Physician: Etheleen Nicks Treating Physician/Extender: Linwood Dibbles, HOYT Weeks in Treatment: 1 Subjective Chief Complaint Information obtained from Patient Patient presents for evaluation concerning open wound of his right lateral ankle after a cardboard box fell causing a laceration the first week of October 2017 History of Present Illness (HPI) The following HPI elements were documented for the patient's wound: Location: right lower extremity Quality: Patient has been experiencing much less pain currently Severity: 1 out of 10 Duration: Patient has had the wound for approximately 3 weeks prior to presenting for treatment Timing: Pain occurs most often with manipulation of the wound Context: Patient dropped a cardboard box which struck his leg causing the initial injury which hasn't healed since onset Modifying Factors: Currently patient has been using over-the-counter antibiotic ointment Associated Signs and Symptoms: Patient reports having difficulty standing for long periods. As noted above the patient had a injury due to working in the yard and this rapidly progressed to a ulcerated area with an infection. In October 2014 he did have some vascular studies done but never saw a Careers adviser and never had any surgery for varicose veins. His past medical history is significant for hypertension, obstructive sleep apnea, morbid obesity, stasis dermatitis of both lower extremities and his past surgical history is  suggestive of hip surgery on the right, gastric bypass surgery, cholecystectomy, hip replacement. He has recently been put on Levaquin 500 milligrams daily, Bactrim DS 1 twice a day and Bactroban 2% topical ointment locally. No recent labs or vascular or imaging studies done. 07/11/2014 a venous duplex study was done on 07/05/2014 -- it showed no evidence of deep vein thrombosis or superficial thrombosis bilaterally. 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 The patient has an appointment to see the vascular surgeons on June 20. 07/18/2014 his appointment with the vascular surgeons is rescheduled for this afternoon. 07/25/2014 -- he saw the vascular surgeon and is going to have a endovenous procedure done on July 29. 08/01/2014 -- he was diagnosed with a UTI and is on ciprofloxacin for 10 days. readmission: [name], [name] ([id number]) 11/27/15 On evaluation today patient is having discomfort in the right ankle region following an open wound from having dropped a cardboard box striking his ankle. He tells me that this has not healed  despite many of the measures that he learned from previous injuries when he was seen at our wound center. Therefore he didn't come in for reevaluation today. He has not noted any significant purulent discharge at this point in time. 12/14/15 patient appears to be doing somewhat better at this point in time today in regard to his ankle wound. it is measuring smaller and looking cleaner. Objective Constitutional Well-nourished and well-hydrated in no acute distress. Vitals Time Taken: 2:17 PM, Height: 70 in, Weight: 330 lbs, BMI: 47.3, Temperature: 97.7 F, Pulse: 78 bpm, Respiratory Rate: 18 breaths/min, Blood Pressure: 146/86 mmHg. Respiratory normal breathing without difficulty. Psychiatric this patient is able to make decisions and demonstrates good insight into disease process.  Alert and Oriented x 3. pleasant and cooperative. General Notes: Patient's wound does still have necrotic slough noted over the surface that required debridement today. He tolerated debridement well without complication. There wasn't much better wound bed post debridement. This does seem to be improving and was smaller compared to last week. Integumentary (Hair, Skin) Wound #5 status is Open. Original cause of wound was Gradually Appeared. The wound is located on the Right,Lateral Malleolus. The wound measures 3.5cm length x 1.4cm width x 0.2cm depth; 3.848cm^2 area and 0.77cm^3 volume. The wound is limited to skin breakdown. There is no tunneling or undermining noted. There is a medium amount of serosanguineous drainage noted. The wound margin is distinct with the outline attached to the wound base. There is large (67-100%) pink granulation within the wound bed. There is a small (1-33%) amount of necrotic tissue within the wound bed including Adherent Slough. The periwound skin appearance exhibited: Localized Edema, Moist, Hemosiderin Staining, Mottled. The periwound skin appearance did not exhibit: Callus, Crepitus, Excoriation, Fluctuance, Friable, Induration, Rash, Scarring, Dry/Scaly, Maceration, Atrophie Blanche, Cyanosis, Ecchymosis, Pallor, Rubor, Erythema. Periwound temperature was noted as No Abnormality. The periwound has tenderness on palpation. [name] AlmaSHAFAR, [name] ([id number]) Assessment Active Problems ICD-10 S81.811S - Laceration without foreign body, right lower leg, sequela I87.2 - Venous insufficiency (chronic) (peripheral) L97.312 - Non-pressure chronic ulcer of right ankle with fat layer exposed E66.01 - Morbid (severe) obesity due to excess calories Procedures Wound #5 Wound #5 is a Venous Leg Ulcer located on the Right,Lateral Malleolus . There was a Skin/Subcutaneous Tissue Debridement ([id number]) debridement with total area of 4.9 sq cm performed by STONE III, HOYT  E., PA-C. with the following instrument(s): Curette to remove Non-Viable tissue/material including Fibrin/Slough and Subcutaneous after achieving pain control using Lidocaine 4% Topical Solution. A time out was conducted at 14:39, prior to the start of the procedure. A Minimum amount of bleeding was controlled with Pressure. The procedure was tolerated well with a pain level of 0 throughout and a pain level of 0 following the procedure. Post Debridement Measurements: 3.5cm length x 1.4cm width x 0.25cm depth; 0.962cm^3 volume. Character of Wound/Ulcer Post Debridement requires further debridement. Severity of Tissue Post Debridement is: Fat layer exposed. Post procedure Diagnosis Wound #5: Same as Pre-Procedure Plan Wound Cleansing: Wound #5 Right,Lateral Malleolus: Clean wound with Normal Saline. May shower with protection. No tub bath. Anesthetic: Wound #5 Right,Lateral Malleolus: Topical Lidocaine 4% cream applied to wound bed prior to debridement Primary Wound Dressing: Wound #5 Right,Lateral Malleolus: Aquacel Ag Secondary Dressing: [name], [name] ([id number]) Wound #5 Right,Lateral Malleolus: ABD pad Dressing Change Frequency: Wound #5 Right,Lateral Malleolus: Change dressing every week Follow-up Appointments: Wound #5 Right,Lateral Malleolus: Return Appointment in 1 week. Edema Control: Wound #5 Right,Lateral Malleolus: 3 Layer Compression  System - Right Lower Extremity Additional Orders / Instructions: Wound #5 Right,Lateral Malleolus: Increase protein intake. Activity as tolerated Other: - MVI, Vit A, C, Zinc Follow-Up Appointments: A Patient Clinical Summary of Care was provided to GS Curreently regard to the Aquasol AG dressing as well as the 3 layer compression wrap. Tolerating both well and I do believe that it is benefiting him currently. We will see him for reevaluation in 1 week. All questions were answered to the best of my ability during the office  visit. Electronic Signature(s) Signed: 12/05/2015 12:52:14 AM By: Lenda KelpStone III, Hoyt PA-C Entered By: Lenda KelpStone III, Hoyt on 12/04/2015 19:47:25 [name], [name] ([id number]) -------------------------------------------------------------------------------- SuperBill Details Patient Name: [name] AlmaSHAFAR, Kary R. Date of Service: 12/04/2015 Medical Record Number: [id number] Patient Account Number: [id number] Date of Birth/Sex: 10/27/1945 84(70 y.o. Male) Treating RN: Clover MealyAfful, RN, BSN, Rita Primary Care Physician: Etheleen NicksMACDONALD, KERI Other Clinician: Referring Physician: Etheleen NicksMACDONALD, KERI Treating Physician/Extender: Linwood DibblesSTONE III, HOYT Weeks in Treatment: 1 Diagnosis Coding ICD-10 Codes Code Description [id number] Laceration without foreign body, right lower leg, sequela I87.2 Venous insufficiency (chronic) (peripheral) L97.312 Non-pressure chronic ulcer of right ankle with fat layer exposed E66.01 Morbid (severe) obesity due to excess calories Facility Procedures CPT4 Code Description: [id number] 11042 - DEB SUBQ TISSUE 20 SQ CM/< ICD-10 Description Diagnosis L97.312 Non-pressure chronic ulcer of right ankle with fat Modifier: layer expose Quantity: 1 d Physician Procedures CPT4 Code Description: [id number] 11042 - WC PHYS SUBQ TISS 20 SQ CM ICD-10 Description Diagnosis L97.312 Non-pressure chronic ulcer of right ankle with fat Modifier: layer expose Quantity: 1 d Electronic Signature(s) Signed: 12/05/2015 12:52:14 AM By: Lenda KelpStone III, Hoyt PA-C Entered By: Lenda KelpStone III, Hoyt on 12/04/2015 19:47:38

## 2015-12-11 ENCOUNTER — Encounter: Payer: Medicare Other | Admitting: Internal Medicine

## 2015-12-11 DIAGNOSIS — S81811S Laceration without foreign body, right lower leg, sequela: Secondary | ICD-10-CM | POA: Diagnosis not present

## 2015-12-12 NOTE — Progress Notes (Signed)
[name], [name] ([id number]) Visit Report for 12/11/2015 Chief Complaint Document Details Patient Name: [name], [name]. Date of Service: 12/11/2015 3:00 PM Medical Record Number: [id number] Patient Account Number: [id number] Date of Birth/Sex: [birth year] (70 y.o. Male) Treating RN: Clover Mealy, RN, BSN, [place] Sink Primary Care Physician: Etheleen Nicks Other Clinician: Referring Physician: Etheleen Nicks Treating Physician/Extender: Maxwell Caul Weeks in Treatment: 2 Information Obtained from: Patient Chief Complaint Patient presents for evaluation concerning open wound of his right lateral ankle after a cardboard box fell causing a laceration the first week of October 2017 Electronic Signature(s) Signed: 12/11/2015 4:42:06 PM By: Baltazar Najjar MD Entered By: Baltazar Najjar on 12/11/2015 16:28:11 Brand, Jorje Guild ([id number]) -------------------------------------------------------------------------------- Debridement Details Patient Name: [name] Pen R. Date of Service: 12/11/2015 3:00 PM Medical Record Number: [id number] Patient Account Number: [id number] Date of Birth/Sex: 05-18-1945 (70 y.o. Male) Treating RN: Clover Mealy, RN, BSN, Rita Primary Care Physician: Etheleen Nicks Other Clinician: Referring Physician: Etheleen Nicks Treating Physician/Extender: Altamese Salt[place] in Treatment: 2 Debridement Performed for Wound #5 Right,Lateral Malleolus Assessment: Performed By: Physician Maxwell Caul, MD Debridement: Debridement Pre-procedure Yes - 15:11 Verification/Time Out Taken: Start Time: 15:12 Pain Control: Lidocaine 4% Topical Solution Level: Skin/Subcutaneous Tissue Total Area Debrided (L x 3 (cm) x 1 (cm) = 3 (cm) W): Tissue and other Non-Viable, Fibrin/Slough, Subcutaneous material debrided: Instrument: Curette Bleeding: Minimum Hemostasis Achieved: Pressure End Time: 15:15 Procedural Pain: 0 Post Procedural Pain: 0 Response to Treatment: Procedure was  tolerated well Post Debridement Measurements of Total Wound Length: (cm) 3 Width: (cm) 1 Depth: (cm) 0.2 Volume: (cm) 0.471 Character of Wound/Ulcer Post Requires Further Debridement Debridement: Severity of Tissue Post Debridement: Fat layer exposed Post Procedure Diagnosis Same as Pre-procedure Electronic Signature(s) Signed: 12/11/2015 4:40:19 PM By: Elpidio Eric BSN, RN Signed: 12/11/2015 4:42:06 PM By: Baltazar Najjar MD Entered By: Baltazar Najjar on 12/11/2015 16:27:56 Abel, Jorje Guild ([id number]) [name], Neymar RMarland Kitchen ([id number]) -------------------------------------------------------------------------------- HPI Details Patient Name: [name]. Date of Service: 12/11/2015 3:00 PM Medical Record Number: [id number] Patient Account Number: [id number] Date of Birth/Sex: Nov 24, 1945 (70 y.o. Male) Treating RN: Clover Mealy, RN, BSN, Rita Primary Care Physician: Etheleen Nicks Other Clinician: Referring Physician: Etheleen Nicks Treating Physician/Extender: Altamese Muniz in Treatment: 2 History of Present Illness Location: right lower extremity Quality: Patient has been experiencing much less pain currently Severity: 1 out of 10 Duration: Patient has had the wound for approximately 3 weeks prior to presenting for treatment Timing: Pain occurs most often with manipulation of the wound Context: Patient dropped a cardboard box which struck his leg causing the initial injury which hasn't healed since onset Modifying Factors: Currently patient has been using over-the-counter antibiotic ointment Associated Signs and Symptoms: Patient reports having difficulty standing for long periods. HPI Description: As noted above the patient had a injury due to working in the yard and this rapidly progressed to a ulcerated area with an infection. In October 2014 he did have some vascular studies done but never saw a Careers adviser and never had any surgery for varicose veins. His past medical  history is significant for hypertension, obstructive sleep apnea, morbid obesity, stasis dermatitis of both lower extremities and his past surgical history is suggestive of hip surgery on the right, gastric bypass surgery, cholecystectomy, hip replacement. He has recently been put on Levaquin 500 milligrams daily, Bactrim DS 1 twice a day and Bactroban 2% topical ointment locally. No recent labs or vascular or imaging studies done. 07/11/2014 a venous duplex study was done on  07/05/2014 -- it showed no evidence of deep vein thrombosis or superficial thrombosis bilaterally. 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 The patient has an appointment to see the vascular surgeons on June 20. 07/18/2014 his appointment with the vascular surgeons is rescheduled for this afternoon. 07/25/2014 -- he saw the vascular surgeon and is going to have a endovenous procedure done on July 29. 08/01/2014 -- he was diagnosed with a UTI and is on ciprofloxacin for 10 days. readmission: 11/27/15 On evaluation today patient is having discomfort in the right ankle region following an open wound from having dropped a cardboard box striking his ankle. He tells me that this has not healed despite many of the measures that he learned from previous injuries when he was seen at our wound center. Therefore he didn't come in for reevaluation today. He has not noted any significant purulent discharge at this point in time. 12/04/15 patient appears to be doing somewhat better at this point in time today in regard to his ankle wound. it is measuring smaller and looking cleaner. 12/11/15; patient wound continues to look improve this is on the right lateral ankle. Debrided of surface slough and nonviable subcutaneous tissue. Using silver alginate ABIE, CHEEK ([id number]) Electronic Signature(s) Signed: 12/11/2015 4:42:06 PM By: Baltazar Najjar  MD Entered By: Baltazar Najjar on 12/11/2015 16:29:03 [name] ([id number]) -------------------------------------------------------------------------------- Physical Exam Details Patient Name: [name]. Date of Service: 12/11/2015 3:00 PM Medical Record Number: [id number] Patient Account Number: [id number] Date of Birth/Sex: 11-11-1945 (70 y.o. Male) Treating RN: Clover Mealy, RN, BSN, [place] Sink Primary Care Physician: Etheleen Nicks Other Clinician: Referring Physician: Etheleen Nicks Treating Physician/Extender: Maxwell Caul Weeks in Treatment: 2 Constitutional Patient is hypertensive.. Pulse regular and within target range for patient.Marland Kitchen Respirations regular, non-labored and within target range.. Temperature is normal and within the target range for the patient.. Patient's appearance is neat and clean. Appears in no acute distress. Well nourished and well developed.. Cardiovascular Pedal pulses palpable and strong bilaterally.. Edema present in both extremities. Significant hemosiderin deposition.. Notes Wound exam; the patient's wound was debrided of surface slough and circumferential skin and subcutaneous tissue around the wound edge. This cleans up quite nicely Electronic Signature(s) Signed: 12/11/2015 4:42:06 PM By: Baltazar Najjar MD Entered By: Baltazar Najjar on 12/11/2015 16:29:54 [name] ([id number]) -------------------------------------------------------------------------------- Physician Orders Details Patient Name: [name]. Date of Service: 12/11/2015 3:00 PM Medical Record Number: [id number] Patient Account Number: [id number] Date of Birth/Sex: September 02, 1945 (70 y.o. Male) Treating RN: Clover Mealy, RN, BSN, [place] Sink Primary Care Physician: Etheleen Nicks Other Clinician: Referring Physician: Etheleen Nicks Treating Physician/Extender: Altamese [place]amp[place] in Treatment: 2 Verbal / Phone Orders: Yes Clinician: Afful, RN, BSN, Rita Read Back and  Verified: Yes Diagnosis Coding Wound Cleansing Wound #5 Right,Lateral Malleolus o Clean wound with Normal Saline. o May shower with protection. o No tub bath. Anesthetic Wound #5 Right,Lateral Malleolus o Topical Lidocaine 4% cream applied to wound bed prior to debridement Primary Wound Dressing Wound #5 Right,Lateral Malleolus o Aquacel Ag Secondary Dressing Wound #5 Right,Lateral Malleolus o ABD pad Dressing Change Frequency Wound #5 Right,Lateral Malleolus o Change dressing every week Follow-up Appointments Wound #5 Right,Lateral Malleolus o Return Appointment in 1 week. Edema Control Wound #5 Right,Lateral Malleolus o 3 Layer Compression System - Right Lower Extremity Additional Orders / Instructions Wound #5 Right,Lateral Malleolus o Increase protein intake. o Activity as tolerated o Other: - MVI, Vit A, C,  Zinc [name] AlmaSHAFAR, Rayford R. ([id number]) Electronic Signature(s) Signed: 12/11/2015 4:40:19 PM By: Elpidio EricAfful, Rita BSN, RN Signed: 12/11/2015 4:42:06 PM By: Baltazar Najjarobson, Michael MD Entered By: Elpidio EricAfful, Rita on 12/11/2015 15:16:49 Fedorchak, [name] ([id number]) -------------------------------------------------------------------------------- Problem List Details Patient Name: [name] AlmaSHAFAR, Elyon R. Date of Service: 12/11/2015 3:00 PM Medical Record Number: [id number] Patient Account Number: [id number] Date of Birth/Sex: 02/20/1945 5(70 y.o. Male) Treating RN: Clover MealyAfful, RN, BSN, 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 ([id number]) -------------------------------------------------------------------------------- Progress Note Details Patient Name: [name] AlmaSHAFAR, Mordechai R. Date of Service: 12/11/2015 3:00 PM Medical Record Number: [id number] Patient Account Number: [id number] Date of Birth/Sex: 02/27/1945 [age] y.o. Male) Treating RN: Clover MealyAfful, RN, BSN, Rita Primary Care Physician: Etheleen NicksMACDONALD, KERI Other Clinician: Referring Physician: Etheleen NicksMACDONALD, KERI Treating Physician/Extender: Altamese CarolinaOBSON, MICHAEL G Weeks in Treatment: 2 Subjective Chief Complaint Information obtained from Patient Patient presents for evaluation concerning open wound of his right lateral ankle after a cardboard box fell causing a laceration the first week of October 2017 History of Present Illness (HPI) The following HPI elements were documented for the patient's wound: Location: right lower extremity Quality: Patient has been experiencing much less pain currently Severity: 1 out of 10 Duration: Patient has had the wound for approximately 3 weeks prior to presenting for treatment Timing: Pain occurs most often with manipulation of the wound Context: Patient dropped a cardboard box which struck his leg causing the initial injury which hasn't healed since onset Modifying Factors: Currently patient has been using over-the-counter antibiotic ointment Associated Signs and Symptoms: Patient reports having difficulty standing for long periods. As noted above the patient had a injury due to working in the yard and this rapidly progressed to a ulcerated area with an infection. In October 2014 he did have some vascular studies done but never saw a Careers advisersurgeon and never had any surgery for varicose veins. His past medical history is  significant for hypertension, obstructive sleep apnea, morbid obesity, stasis dermatitis of both lower extremities and his past surgical history is suggestive of hip surgery on the right, gastric bypass surgery, cholecystectomy, hip replacement. He has recently been put on Levaquin 500 milligrams daily, Bactrim DS 1 twice a day and Bactroban 2% topical ointment locally. No recent labs or vascular or imaging studies done. 07/11/2014 a venous duplex study was done on 07/05/2014 -- it showed no evidence of deep vein thrombosis or superficial thrombosis bilaterally. 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 The patient has an appointment to see the vascular surgeons on June 20. 07/18/2014 his appointment with the vascular surgeons is rescheduled for this afternoon. 07/25/2014 -- he saw the vascular surgeon and is going to have a endovenous procedure done on July 29. 08/01/2014 -- he was diagnosed with a UTI and is on ciprofloxacin for 10 days. readmission: [name] AlmaSHAFAR, Quanell R. ([id number]) 11/27/15 On evaluation today patient is having discomfort in the right ankle region following an open wound from having dropped a cardboard box striking  his ankle. He tells me that this has not healed despite many of the measures that he learned from previous injuries when he was seen at our wound center. Therefore he didn't come in for reevaluation today. He has not noted any significant purulent discharge at this point in time. 12/04/15 patient appears to be doing somewhat better at this point in time today in regard to his ankle wound. it is measuring smaller and looking cleaner. 12/11/15; patient wound continues to look improve this is on the right lateral ankle. Debrided of surface slough and nonviable subcutaneous tissue. Using silver alginate Objective Constitutional Patient is hypertensive.. Pulse regular and within target range for  patient.Marland Kitchen Respirations regular, non-labored and within target range.. Temperature is normal and within the target range for the patient.. Patient's appearance is neat and clean. Appears in no acute distress. Well nourished and well developed.. Vitals Time Taken: 2:53 PM, Height: 70 in, Weight: 330 lbs, BMI: 47.3, Temperature: 97.7 F, Pulse: 75 bpm, Respiratory Rate: 18 breaths/min, Blood Pressure: 154/74 mmHg. Cardiovascular Pedal pulses palpable and strong bilaterally.. Edema present in both extremities. Significant hemosiderin deposition.. General Notes: Wound exam; the patient's wound was debrided of surface slough and circumferential skin and subcutaneous tissue around the wound edge. This cleans up quite nicely Integumentary (Hair, Skin) Wound #5 status is Open. Original cause of wound was Gradually Appeared. The wound is located on the Right,Lateral Malleolus. The wound measures 3cm length x 1cm width x 0.2cm depth; 2.356cm^2 area and 0.471cm^3 volume. The wound is limited to skin breakdown. There is no tunneling or undermining noted. There is a medium amount of serosanguineous drainage noted. The wound margin is distinct with the outline attached to the wound base. There is large (67-100%) pink granulation within the wound bed. There is a small (1-33%) amount of necrotic tissue within the wound bed including Adherent Slough. The periwound skin appearance exhibited: Localized Edema, Moist, Hemosiderin Staining, Mottled. The periwound skin appearance did not exhibit: Callus, Crepitus, Excoriation, Fluctuance, Friable, Induration, Rash, Scarring, Dry/Scaly, Maceration, Atrophie Blanche, Cyanosis, Ecchymosis, Pallor, Rubor, Erythema. Periwound temperature was noted as No Abnormality. The periwound has tenderness on palpation. EARLY, STEEL ([id number]) Assessment Active Problems ICD-10 S81.811S - Laceration without foreign body, right lower leg, sequela I87.2 - Venous insufficiency  (chronic) (peripheral) L97.312 - Non-pressure chronic ulcer of right ankle with fat layer exposed E66.01 - Morbid (severe) obesity due to excess calories Procedures Wound #5 Wound #5 is a Venous Leg Ulcer located on the Right,Lateral Malleolus . There was a Skin/Subcutaneous Tissue Debridement ([id number]) debridement with total area of 3 sq cm performed by Maxwell Caul, MD. with the following instrument(s): Curette to remove Non-Viable tissue/material including Fibrin/Slough and Subcutaneous after achieving pain control using Lidocaine 4% Topical Solution. A time out was conducted at 15:11, prior to the start of the procedure. A Minimum amount of bleeding was controlled with Pressure. The procedure was tolerated well with a pain level of 0 throughout and a pain level of 0 following the procedure. Post Debridement Measurements: 3cm length x 1cm width x 0.2cm depth; 0.471cm^3 volume. Character of Wound/Ulcer Post Debridement requires further debridement. Severity of Tissue Post Debridement is: Fat layer exposed. Post procedure Diagnosis Wound #5: Same as Pre-Procedure Plan Wound Cleansing: Wound #5 Right,Lateral Malleolus: Clean wound with Normal Saline. May shower with protection. No tub bath. Anesthetic: Wound #5 Right,Lateral Malleolus: Topical Lidocaine 4% cream applied to wound bed prior to debridement Primary Wound Dressing: Wound #5 Right,Lateral Malleolus: Aquacel Ag Secondary  Dressing: [name] AlmaSHAFAR, Chael R. ([id number]) Wound #5 Right,Lateral Malleolus: ABD pad Dressing Change Frequency: Wound #5 Right,Lateral Malleolus: Change dressing every week Follow-up Appointments: Wound #5 Right,Lateral Malleolus: Return Appointment in 1 week. Edema Control: Wound #5 Right,Lateral Malleolus: 3 Layer Compression System - Right Lower Extremity Additional Orders / Instructions: Wound #5 Right,Lateral Malleolus: Increase protein intake. Activity as tolerated Other: - MVI, Vit  A, C, Zinc #1 we continue the silver alginate based dressings under a Profore light wrap #2 depending on the wound dimensions next week I'll consider changing the primary dressing. #3 the patient has very significant venous insufficiency. I'm uncertain whether he is had reflux studies. The stocking he has on his left leg is certainly not sufficient for the degree of venous disease he appears to have. He tells me he has been to elastic therapy and [place] in the past but didn't know the exact compression that was required. Will make sure to given this information before he leaves the clinic after the wound is healed Electronic Signature(s) Signed: 12/11/2015 4:42:06 PM By: Baltazar Najjarobson, Michael MD Entered By: Baltazar Najjarobson, Michael on 12/11/2015 16:31:32 Stephens, [name] ([id number]) -------------------------------------------------------------------------------- SuperBill Details Patient Name: [name] PenSHAFAR, Coner R. Date of Service: 12/11/2015 Medical Record Number: [id number] Patient Account Number: [id number] Date of Birth/Sex: 02/05/1945 [age] y.o. Male) Treating RN: Afful, RN, BSN, Rita Primary Care Physician: Etheleen NicksMACDONALD, KERI Other Clinician: Referring Physician: Etheleen NicksMACDONALD, KERI Treating Physician/Extender: Maxwell CaulOBSON, MICHAEL G Weeks in Treatment: 2 Diagnosis Coding ICD-10 Codes Code Description [id number] Laceration without foreign body, right lower leg, sequela I87.2 Venous insufficiency (chronic) (peripheral) L97.312 Non-pressure chronic ulcer of right ankle with fat layer exposed E66.01 Morbid (severe) obesity due to excess calories Facility Procedures CPT4 Code Description: [id number] 11042 - DEB SUBQ TISSUE 20 SQ CM/< ICD-10 Description Diagnosis S81.811S Laceration without foreign body, right lower leg, s L97.312 Non-pressure chronic ulcer of right ankle with fat Modifier: equela layer expose Quantity: 1 d Physician Procedures CPT4 Code Description: [id number] 11042 - WC PHYS SUBQ TISS 20 SQ CM ICD-10  Description Diagnosis S81.811S Laceration without foreign body, right lower leg, s L97.312 Non-pressure chronic ulcer of right ankle with fat Modifier: equela layer expose Quantity: 1 d Electronic Signature(s) Signed: 12/11/2015 4:42:06 PM By: Baltazar Najjarobson, Michael MD Entered By: Baltazar Najjarobson, Michael on 12/11/2015 16:32:08

## 2015-12-12 NOTE — Progress Notes (Signed)
[name], [name] ([id number]) Visit Report for 12/11/2015 Arrival Information Details Patient Name: [name], [name]. Date of Service: 12/11/2015 3:00 PM Medical Record Number: [id number] Patient Account Number: [id number] Date of Birth/Sex: 11-27-1945 (70 y.o. Male) Treating RN: Clover Mealy, RN, BSN, [place] Sink Primary Care Physician: Etheleen Nicks Other Clinician: Referring Physician: Etheleen Nicks Treating Physician/Extender: Altamese [place] in Treatment: 2 Visit Information History Since Last Visit All ordered tests and consults were completed: No Patient Arrived: [name] Added or deleted any medications: No Arrival Time: 14:52 Any new allergies or adverse reactions: No Accompanied By: wife Had a fall or experienced change in No Transfer Assistance: None activities of daily living that may affect Patient Identification Verified: Yes risk of falls: Secondary Verification Process Completed: Yes Signs or symptoms of abuse/neglect since last No Patient Requires Transmission-Based No visito Precautions: Hospitalized since last visit: No Patient Has Alerts: No Has Dressing in Place as Prescribed: Yes Has Compression in Place as Prescribed: Yes Pain Present Now: No Electronic Signature(s) Signed: 12/11/2015 4:40:19 PM By: Elpidio Eric BSN, RN Entered By: Elpidio Eric on 12/11/2015 14:53:31 Schmieg, Jorje Guild ([id number]) -------------------------------------------------------------------------------- Encounter Discharge Information Details Patient Name: [name]. Date of Service: 12/11/2015 3:00 PM Medical Record Number: [id number] Patient Account Number: [id number] Date of Birth/Sex: 12/01/1945 (71 y.o. Male) Treating RN: Clover Mealy, RN, BSN, [place] Sink Primary Care Physician: Etheleen Nicks Other Clinician: Referring Physician: Etheleen Nicks Treating Physician/Extender: Altamese [place] in Treatment: 2 Encounter Discharge Information Items Discharge Pain Level: 0 Discharge  Condition: Stable Ambulatory Status: Cane Discharge Destination: Home Transportation: Private Auto Accompanied By: wife Schedule Follow-up Appointment: No Medication Reconciliation completed and provided to Patient/Care No Walter Grima: Provided on Clinical Summary of Care: 12/11/2015 Form Type Recipient Paper Patient GS Electronic Signature(s) Signed: 12/11/2015 3:28:03 PM By: Gwenlyn Perking Entered By: Gwenlyn Perking on 12/11/2015 15:28:02 Muscat, Jorje Guild ([id number]) -------------------------------------------------------------------------------- Lower Extremity Assessment Details Patient Name: [name]. Date of Service: 12/11/2015 3:00 PM Medical Record Number: [id number] Patient Account Number: [id number] Date of Birth/Sex: 08-Aug-1945 (70 y.o. Male) Treating RN: Clover Mealy, RN, BSN, [place] Sink Primary Care Physician: Etheleen Nicks Other Clinician: Referring Physician: Etheleen Nicks Treating Physician/Extender: Maxwell Caul Weeks in Treatment: 2 Edema Assessment Assessed: [Left: No] [Right: No] E[Left: dema] [Right: :] Calf Left: Right: Point of Measurement: 35 cm From Medial Instep cm 42.1 cm Ankle Left: Right: Point of Measurement: 9 cm From Medial Instep cm 26.1 cm Vascular Assessment Claudication: Claudication Assessment [Right:None] Pulses: Posterior Tibial Dorsalis Pedis Palpable: [Right:Yes] Extremity colors, hair growth, and conditions: Extremity Color: [Right:Hyperpigmented] Hair Growth on Extremity: [Right:Yes] Temperature of Extremity: [Right:Warm] Capillary Refill: [Right:< 3 seconds] Electronic Signature(s) Signed: 12/11/2015 4:40:19 PM By: Elpidio Eric BSN, RN Entered By: Elpidio Eric on 12/11/2015 14:58:36 Gahm, Timoth RMarland Kitchen ([id number]) -------------------------------------------------------------------------------- Multi Wound Chart Details Patient Name: [name] Pen R. Date of Service: 12/11/2015 3:00 PM Medical Record Number:  [id number] Patient Account Number: [id number] Date of Birth/Sex: 1945-04-27 (70 y.o. Male) Treating RN: Clover Mealy, RN, BSN, [place] Sink Primary Care Physician: Etheleen Nicks Other Clinician: Referring Physician: Etheleen Nicks Treating Physician/Extender: Maxwell Caul Weeks in Treatment: 2 Vital Signs Height(in): 70 Pulse(bpm): 75 Weight(lbs): 330 Blood Pressure 154/74 (mmHg): Body Mass Index(BMI): 47 Temperature(F): 97.7 Respiratory Rate 18 (breaths/min): Photos: [5:No Photos] [N/A:N/A] Wound Location: [5:Right Malleolus - Lateral] [N/A:N/A] Wounding Event: [5:Gradually Appeared] [N/A:N/A] Primary Etiology: [5:Venous Leg Ulcer] [N/A:N/A] Comorbid History: [5:Anemia, Sleep Apnea, Hypertension] [N/A:N/A] Date Acquired: [5:11/06/2015] [N/A:N/A] Weeks of Treatment: [5:2] [N/A:N/A] Wound Status: [5:Open] [N/A:N/A] Measurements L x W x  D 3x1x0.2 [N/A:N/A] (cm) Area (cm) : [5:2.356] [N/A:N/A] Volume (cm) : [5:0.471] [N/A:N/A] % Reduction in Area: [5:46.00%] [N/A:N/A] % Reduction in Volume: 46.00% [N/A:N/A] Classification: [5:Full Thickness Without Exposed Support Structures] [N/A:N/A] Exudate Amount: [5:Medium] [N/A:N/A] Exudate Type: [5:Serosanguineous] [N/A:N/A] Exudate Color: [5:red, brown] [N/A:N/A] Wound Margin: [5:Distinct, outline attached] [N/A:N/A] Granulation Amount: [5:Large (67-100%)] [N/A:N/A] Granulation Quality: [5:Pink] [N/A:N/A] Necrotic Amount: [5:Small (1-33%)] [N/A:N/A] Exposed Structures: [5:Fascia: No Fat: No Tendon: No Muscle: No Joint: No] [N/A:N/A] Bone: No Limited to Skin Breakdown Epithelialization: Small (1-33%) N/A N/A Periwound Skin Texture: Edema: Yes N/A N/A Excoriation: No Induration: No Callus: No Crepitus: No Fluctuance: No Friable: No Rash: No Scarring: No Periwound Skin Moist: Yes N/A N/A Moisture: Maceration: No Dry/Scaly: No Periwound Skin Color: Hemosiderin Staining: Yes N/A N/A Mottled: Yes Atrophie Blanche:  No Cyanosis: No Ecchymosis: No Erythema: No Pallor: No Rubor: No Temperature: No Abnormality N/A N/A Tenderness on Yes N/A N/A Palpation: Wound Preparation: Ulcer Cleansing: N/A N/A Rinsed/Irrigated with Saline, Other: surg scrub with water Topical Anesthetic Applied: Other: lidocaine 4% Treatment Notes Electronic Signature(s) Signed: 12/11/2015 4:40:19 PM By: Elpidio Eric BSN, RN Entered By: Elpidio Eric on 12/11/2015 15:06:33 [name] ([id number]) -------------------------------------------------------------------------------- Multi-Disciplinary Care Plan Details Patient Name: [name]. Date of Service: 12/11/2015 3:00 PM Medical Record Number: [id number] Patient Account Number: [id number] Date of Birth/Sex: [birth year] (70 y.o. Male) Treating RN: Clover Mealy, RN, BSN, [place] Sink Primary Care Physician: Etheleen Nicks Other Clinician: Referring Physician: Etheleen Nicks Treating Physician/Extender: Altamese [place] in Treatment: 2 Active Inactive Orientation to the Wound Care Program Nursing Diagnoses: Knowledge deficit related to the wound healing center program Goals: Patient/caregiver will verbalize understanding of the Wound Healing Center Program Date Initiated: 11/27/2015 Goal Status: Active Interventions: Provide education on orientation to the wound center Notes: Venous Leg Ulcer Nursing Diagnoses: Knowledge deficit related to disease process and management Potential for venous Insuffiency (use before diagnosis confirmed) Goals: Non-invasive venous studies are completed as ordered Date Initiated: 11/27/2015 Goal Status: Active Patient will maintain optimal edema control Date Initiated: 11/27/2015 Goal Status: Active Verify adequate tissue perfusion prior to therapeutic compression application Date Initiated: 11/27/2015 Goal Status: Active Interventions: Assess peripheral edema status every visit. Compression as ordered Provide education on  venous insufficiency Southard, Praise R. ([id number]) Treatment Activities: Therapeutic compression applied : 11/27/2015 Notes: Wound/Skin Impairment Nursing Diagnoses: Impaired tissue integrity Knowledge deficit related to ulceration/compromised skin integrity Goals: Patient will have a decrease in wound volume by X% from date: (specify in notes) Date Initiated: 11/27/2015 Goal Status: Active Ulcer/skin breakdown will have a volume reduction of 30% by week 4 Date Initiated: 11/27/2015 Goal Status: Active Ulcer/skin breakdown will have a volume reduction of 50% by week 8 Date Initiated: 11/27/2015 Goal Status: Active Ulcer/skin breakdown will have a volume reduction of 80% by week 12 Date Initiated: 11/27/2015 Goal Status: Active Ulcer/skin breakdown will heal within 14 weeks Date Initiated: 11/27/2015 Goal Status: Active Interventions: Assess patient/caregiver ability to obtain necessary supplies Assess patient/caregiver ability to perform ulcer/skin care regimen upon admission and as needed Assess ulceration(s) every visit Provide education on ulcer and skin care Treatment Activities: Skin care regimen initiated : 11/27/2015 Topical wound management initiated : 11/27/2015 Notes: Electronic Signature(s) Signed: 12/11/2015 4:40:19 PM By: Elpidio Eric BSN, RN Entered By: Elpidio Eric on 12/11/2015 15:05:27 Bagby, Jorje Guild ([id number]) [name], Aydeen R. ([id number]) -------------------------------------------------------------------------------- Pain Assessment Details Patient Name: [name]. Date of Service: 12/11/2015 3:00 PM Medical Record Number: [id number] Patient Account Number: [id number] Date of Birth/Sex: November 24, 1945 (  70 y.o. Male) Treating RN: Afful, RN, BSN, [place] Sink[place] Primary Care Physician: Etheleen NicksMACDONALD, KERI Other Clinician: Referring Physician: Etheleen NicksMACDONALD, KERI Treating Physician/Extender: Altamese CarolinaOBSON, MICHAEL G Weeks in Treatment: 2 Active Problems Location of Pain  Severity and Description of Pain Patient Has Paino No Site Locations Pain Management and Medication Current Pain Management: Electronic Signature(s) Signed: 12/11/2015 4:40:19 PM By: Elpidio EricAfful, Rita BSN, RN Entered By: Elpidio EricAfful, Rita on 12/11/2015 14:53:43 Trigo, [name] ([id number]) -------------------------------------------------------------------------------- Patient/Caregiver Education Details Patient Name: [name] AlmaSHAFAR, Rex R. Date of Service: 12/11/2015 3:00 PM Medical Record Number: [id number] Patient Account Number: [id number] Date of Birth/Gender: 12/21/1945 [age] y.o. Male) Treating RN: Clover MealyAfful, RN, BSN, [place] Sinkita Primary Care Physician: Etheleen NicksMACDONALD, KERI Other Clinician: Referring Physician: Etheleen NicksMACDONALD, KERI Treating Physician/Extender: Altamese CarolinaOBSON, MICHAEL G Weeks in Treatment: 2 Education Assessment Education Provided To: Patient Education Topics Provided Venous: Methods: Explain/Verbal Responses: State content correctly Welcome To The Wound Care Center: Methods: Explain/Verbal Responses: State content correctly Wound/Skin Impairment: Methods: Explain/Verbal Responses: State content correctly Electronic Signature(s) Signed: 12/11/2015 4:40:19 PM By: Elpidio EricAfful, Rita BSN, RN Entered By: Elpidio EricAfful, Rita on 12/11/2015 15:27:50 Galligan, [name] -------------------------------------------------------------------------------- Wound Assessment Details Patient Name: [name] PenSHAFAR, Lamarr R. Date of Service: 12/11/2015 3:00 PM Medical Record Number: [id number] Patient Account Number: [id number] Date of Birth/Sex: 11/06/1945 [age] y.o. Male) Treating RN: Clover MealyAfful, RN, BSN, [place] Sinkita Primary Care Physician: Etheleen NicksMACDONALD, KERI Other Clinician: Referring Physician: Etheleen NicksMACDONALD, KERI Treating Physician/Extender: Maxwell CaulOBSON, MICHAEL G Weeks in Treatment: 2 Wound Status Wound Number: 5 Primary Etiology: Venous Leg Ulcer Wound Location: Right Malleolus - Lateral Wound Status: Open Wounding Event: Gradually Appeared  Comorbid Anemia, Sleep Apnea, History: Hypertension Date Acquired: 11/06/2015 Weeks Of Treatment: 2 Clustered Wound: No Photos Photo Uploaded By: Elpidio EricAfful, Rita on 12/11/2015 16:30:58 Wound Measurements Length: (cm) 3 Width: (cm) 1 Depth: (cm) 0.2 Area: (cm) 2.356 Volume: (cm) 0.471 % Reduction in Area: 46% % Reduction in Volume: 46% Epithelialization: Small (1-33%) Tunneling: No Undermining: No Wound Description Full Thickness Without Exposed Foul Odor Af Classification: Support Structures Wound Margin: Distinct, outline attached Exudate Medium Amount: Exudate Type: Serosanguineous Exudate Color: red, brown ter Cleansing: No Wound Bed Granulation Amount: Large (67-100%) Exposed Structure Granulation Quality: Pink Fascia Exposed: No Necrotic Amount: Small (1-33%) Fat Layer Exposed: No Moseley, Verland R. ([id number]) Necrotic Quality: Adherent Slough Tendon Exposed: No Muscle Exposed: No Joint Exposed: No Bone Exposed: No Limited to Skin Breakdown Periwound Skin Texture Texture Color No Abnormalities Noted: No No Abnormalities Noted: No Callus: No Atrophie Blanche: No Crepitus: No Cyanosis: No Excoriation: No Ecchymosis: No Fluctuance: No Erythema: No Friable: No Hemosiderin Staining: Yes Induration: No Mottled: Yes Localized Edema: Yes Pallor: No Rash: No Rubor: No Scarring: No Temperature / Pain Moisture Temperature: No Abnormality No Abnormalities Noted: No Tenderness on Palpation: Yes Dry / Scaly: No Maceration: No Moist: Yes Wound Preparation Ulcer Cleansing: Rinsed/Irrigated with Saline, Other: surg scrub with water, Topical Anesthetic Applied: Other: lidocaine 4%, Treatment Notes Wound #5 (Right, Lateral Malleolus) 1. Cleansed with: Cleanse wound with antibacterial soap and water 3. Peri-wound Care: Barrier cream Moisturizing lotion 4. Dressing Applied: Aquacel Ag 5. Secondary Dressing Applied ABD Pad Dry Gauze 7. Secured  with 3 Layer Compression System - Right Lower Extremity Electronic Signature(s) Signed: 12/11/2015 4:40:19 PM By: Elpidio EricAfful, Rita BSN, RN Entered By: Elpidio EricAfful, Rita on 12/11/2015 15:05:23 [name] AlmaSHAFAR, Tariq R. ([id number]) Charise KillianSHAFAR, [name] ([id number]) -------------------------------------------------------------------------------- Vitals Details Patient Name: [name] AlmaSHAFAR, Sterlin R. Date of Service: 12/11/2015 3:00 PM Medical Record Number: [id number] Patient Account Number: [id number] Date of Birth/Sex: 02/15/1945 [age] y.o. Male) Treating  RN: Clover MealyAfful, RN, BSN, Rita Primary Care Physician: Etheleen NicksMACDONALD, KERI Other Clinician: Referring Physician: Etheleen NicksMACDONALD, KERI Treating Physician/Extender: Altamese CarolinaOBSON, MICHAEL G Weeks in Treatment: 2 Vital Signs Time Taken: 14:53 Temperature (F): 97.7 Height (in): 70 Pulse (bpm): 75 Weight (lbs): 330 Respiratory Rate (breaths/min): 18 Body Mass Index (BMI): 47.3 Blood Pressure (mmHg): 154/74 Reference Range: 80 - 120 mg / dl Electronic Signature(s) Signed: 12/11/2015 4:40:19 PM By: Elpidio EricAfful, Rita BSN, RN Entered By: Elpidio EricAfful, Rita on 12/11/2015 14:54:12

## 2015-12-18 ENCOUNTER — Encounter: Payer: Medicare Other | Admitting: Nurse Practitioner

## 2015-12-18 DIAGNOSIS — S81811S Laceration without foreign body, right lower leg, sequela: Secondary | ICD-10-CM | POA: Diagnosis not present

## 2015-12-19 NOTE — Progress Notes (Signed)
[name], [name] ([id number]) Visit Report for 12/18/2015 Arrival Information Details Patient Name: [name], [name] Date of Service: 12/18/2015 1:30 PM Medical Record Number: [id number] Patient Account Number: [id number] Date of Birth/Sex: 04/12/1945 [age] y.o. Male) Treating RN: Clover MealyAfful, RN, BSN, [place] Sinkita Primary Care Physician: Etheleen NicksMACDONALD, KERI Other Clinician: Referring Physician: Etheleen NicksMACDONALD, KERI Treating Physician/Extender: Kathreen Cosieroulter, Leah Weeks in Treatment: 3 Visit Information History Since Last Visit All ordered tests and consults were completed: No Patient Arrived: Ambulatory Added or deleted any medications: No Arrival Time: 13:42 Any new allergies or adverse reactions: No Accompanied By: wife Had a fall or experienced change in No Transfer Assistance: None activities of daily living that may affect Patient Identification Verified: Yes risk of falls: Secondary Verification Process Yes Signs or symptoms of abuse/neglect since last No Completed: visito Patient Requires Transmission-Based No Hospitalized since last visit: No Precautions: Has Dressing in Place as Prescribed: Yes Patient Has Alerts: No Has Compression in Place as Prescribed: Yes Pain Present Now: No Electronic Signature(s) Signed: 12/18/2015 4:22:45 PM By: Elpidio EricAfful, Rita BSN, RN Entered By: Elpidio EricAfful, Rita on 12/18/2015 13:43:17 Bamber, [name] ([id number]) -------------------------------------------------------------------------------- Encounter Discharge Information Details Patient Name: [name], [name] Date of Service: 12/18/2015 1:30 PM Medical Record Number: [id number] Patient Account Number: [id number] Date of Birth/Sex: 10/31/1945 [age] y.o. Male) Treating RN: Clover MealyAfful, RN, BSN, [place] Sinkita Primary Care Physician: Etheleen NicksMACDONALD, KERI Other Clinician: Referring Physician: Etheleen NicksMACDONALD, KERI Treating Physician/Extender: Kathreen Cosieroulter, Leah Weeks in Treatment: 3 Encounter Discharge Information Items Discharge Pain Level: 0 Discharge  Condition: Stable Ambulatory Status: Cane Discharge Destination: Home Transportation: Private Auto Accompanied By: wife Schedule Follow-up Appointment: No Medication Reconciliation completed and provided to Patient/Care No Kyna Blahnik: Provided on Clinical Summary of Care: 12/18/2015 Form Type Recipient Paper Patient GS Electronic Signature(s) Signed: 12/18/2015 2:39:52 PM By: Elpidio EricAfful, Rita BSN, RN Previous Signature: 12/18/2015 2:29:13 PM Version By: Gwenlyn PerkingMoore, Shelia Entered By: Elpidio EricAfful, Rita on 12/18/2015 14:39:51 [name], [name] ([id number]) -------------------------------------------------------------------------------- Lower Extremity Assessment Details Patient Name: [name], [name] Date of Service: 12/18/2015 1:30 PM Medical Record Number: [id number] Patient Account Number: [id number] Date of Birth/Sex: 11/15/1945 [age] y.o. Male) Treating RN: Clover MealyAfful, RN, BSN, [place] Sinkita Primary Care Physician: Etheleen NicksMACDONALD, KERI Other Clinician: Referring Physician: Etheleen NicksMACDONALD, KERI Treating Physician/Extender: Kathreen Cosieroulter, Leah Weeks in Treatment: 3 Edema Assessment Assessed: [Left: No] [Right: No] E[Left: dema] [Right: :] Calf Left: Right: Point of Measurement: 35 cm From Medial Instep cm 42.1 cm Ankle Left: Right: Point of Measurement: 9 cm From Medial Instep cm 26.1 cm Vascular Assessment Claudication: Claudication Assessment [Right:None] Pulses: Posterior Tibial Dorsalis Pedis Palpable: [Right:Yes] Extremity colors, hair growth, and conditions: Extremity Color: [Right:Mottled] Hair Growth on Extremity: [Right:No] Temperature of Extremity: [Right:Warm] Capillary Refill: [Right:< 3 seconds] Toe Nail Assessment Left: Right: Thick: Yes Discolored: Yes Deformed: No Improper Length and Hygiene: No Electronic Signature(s) Signed: 12/18/2015 4:22:45 PM By: Elpidio EricAfful, Rita BSN, RN Entered By: Elpidio EricAfful, Rita on 12/18/2015 13:44:32 [name], [name] [name], [name]  ([id number]) -------------------------------------------------------------------------------- Multi Wound Chart Details Patient Name: [name], [name] Date of Service: 12/18/2015 1:30 PM Medical Record Number: [id number] Patient Account Number: [id number] Date of Birth/Sex: 08/15/1945 [age] y.o. 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] [N/A:N/A] Comorbid History: [5:Anemia, Sleep Apnea, Hypertension] [N/A:N/A] Date Acquired: [5:11/06/2015] [N/A:N/A] Weeks of Treatment: [5:3] [N/A:N/A] Wound Status: [5:Open] [N/A:N/A] Measurements L x W x D 2.5x0.8x0.2 [N/A:N/A] (cm) Area (cm) : [5:1.571] [N/A:N/A] Volume (cm) : [5:0.314] [N/A:N/A] % Reduction in Area: [5:64.00%] [N/A:N/A] % Reduction in Volume: 64.00% [N/A:N/A] Classification: [5:Full Thickness Without Exposed Support Structures] [N/A:N/A] Exudate Amount: [5:Medium] [N/A:N/A] Exudate Type: [5:Serosanguineous] [N/A:N/A] Exudate Color: [5:red, brown] [N/A:N/A] Wound Margin: [5:Distinct, outline attached] [N/A:N/A] Granulation Amount: [5:Large (67-100%)] [N/A:N/A] Granulation Quality: [5:Pink] [N/A:N/A] Necrotic Amount: [5:Small (1-33%)] [N/A:N/A] Exposed Structures: [5:Fascia: No Fat: No Tendon: No Muscle: No Joint: No] [N/A:N/A] Bone: No Limited to Skin Breakdown Epithelialization: Medium (34-66%) N/A N/A Periwound Skin Texture: Edema: Yes N/A N/A Excoriation: No Induration: No Callus: No Crepitus: No Fluctuance:  No Friable: No Rash: No Scarring: No Periwound Skin Moist: Yes N/A N/A Moisture: Maceration: No Dry/Scaly: No Periwound Skin Color: Hemosiderin Staining: Yes N/A N/A Mottled: Yes Atrophie Blanche: No Cyanosis: No Ecchymosis: No Erythema: No Pallor: No Rubor: No Temperature: No Abnormality N/A N/A Tenderness on Yes N/A N/A Palpation: Wound Preparation: Ulcer Cleansing: N/A N/A Rinsed/Irrigated with Saline, Other: surg scrub with water Topical Anesthetic Applied: Other: lidocaine 4% Treatment Notes Electronic Signature(s) Signed: 12/18/2015 4:22:45 PM By: Elpidio Eric BSN, RN Entered By: Elpidio Eric on 12/18/2015 14:16:16 Herschberger, [name] ([id number]) -------------------------------------------------------------------------------- Multi-Disciplinary Care Plan Details Patient Name: [name] Alma. Date of Service: 12/18/2015 1:30 PM Medical Record Number: [id number] Patient Account Number: [id number] Date of Birth/Sex: 06-Sep-1945 (70 y.o. Male) Treating RN: Clover Mealy, RN, BSN, [place] Sink Primary Care Physician: Etheleen Nicks Other Clinician: Referring Physician: Etheleen Nicks Treating Physician/Extender: Kathreen Cosier in Treatment: 3 Active Inactive Orientation to the Wound Care Program Nursing Diagnoses: Knowledge deficit related to the wound healing center program Goals: Patient/caregiver will verbalize understanding of the Wound Healing Center Program Date Initiated: 11/27/2015 Goal Status: Active Interventions: Provide education on orientation to the wound center Notes: Venous Leg Ulcer Nursing Diagnoses: Knowledge deficit related to disease process and management Potential for venous Insuffiency (use before diagnosis confirmed) Goals: Non-invasive venous studies are completed as ordered Date Initiated: 11/27/2015 Goal Status: Active Patient will maintain optimal edema control Date Initiated: 11/27/2015 Goal Status: Active Verify adequate tissue  perfusion prior to therapeutic compression application Date Initiated: 11/27/2015 Goal Status: Active Interventions: Assess peripheral edema status every visit. Compression as ordered Provide education on venous insufficiency Jacquez, [name] ([id number]) Treatment Activities: Therapeutic compression applied : 11/27/2015 Notes: Wound/Skin Impairment Nursing Diagnoses: Impaired tissue integrity Knowledge deficit related to ulceration/compromised skin integrity Goals: Patient will have a decrease in wound volume by X% from date: (specify in notes) Date Initiated: 11/27/2015 Goal Status: Active Ulcer/skin breakdown will have a volume reduction of 30% by week 4 Date Initiated: 11/27/2015 Goal Status: Active Ulcer/skin breakdown will have a volume reduction of 50% by week 8 Date Initiated: 11/27/2015 Goal Status: Active Ulcer/skin breakdown will have a volume reduction of 80% by week 12 Date Initiated: 11/27/2015 Goal Status: Active Ulcer/skin breakdown will heal within 14 weeks Date Initiated: 11/27/2015 Goal Status: Active Interventions: Assess patient/caregiver ability to obtain necessary supplies Assess patient/caregiver ability to perform ulcer/skin care regimen upon admission and as needed Assess ulceration(s) every visit Provide education on ulcer and skin care Treatment Activities: Skin care regimen initiated : 11/27/2015 Topical wound management initiated : 11/27/2015 Notes: Electronic Signature(s) Signed: 12/18/2015 4:22:45 PM By: Elpidio Eric BSN, RN Entered By: Elpidio Eric on 12/18/2015 14:16:01 Irigoyen, [name] ([id number]) Veasey, [name] ([id number]) --------------------------------------------------------------------------------  Pain Assessment Details Patient Name: [name], [name] Date of Service: 12/18/2015 1:30 PM Medical Record Number: [id number] Patient Account Number: [id number] Date of Birth/Sex: 07/26/1945 [age] y.o. Male) Treating RN: Clover MealyAfful, RN, BSN,  [place] Sinkita Primary Care Physician: Etheleen NicksMACDONALD, KERI Other Clinician: Referring Physician: Etheleen NicksMACDONALD, KERI Treating Physician/Extender: Kathreen Cosieroulter, Leah Weeks in Treatment: 3 Active Problems Location of Pain Severity and Description of Pain Patient Has Paino No Site Locations With Dressing Change: No Pain Management and Medication Current Pain Management: Electronic Signature(s) Signed: 12/18/2015 4:22:45 PM By: Elpidio EricAfful, Rita BSN, RN Entered By: Elpidio EricAfful, Rita on 12/18/2015 13:43:26 Thede, [name] ([id number]) -------------------------------------------------------------------------------- Patient/Caregiver Education Details Patient Name: [name], [name] Date of Service: 12/18/2015 1:30 PM Medical Record Number: [id number] Patient Account Number: [id number] Date of Birth/Gender: 07/03/1945 [age] y.o. Male) Treating RN: Clover MealyAfful, RN, BSN, [place] Sinkita Primary Care Physician: Etheleen NicksMACDONALD, KERI Other Clinician: Referring Physician: Etheleen NicksMACDONALD, KERI Treating Physician/Extender: Kathreen Cosieroulter, Leah Weeks in Treatment: 3 Education Assessment Education Provided To: Patient Education Topics Provided Basic Hygiene: Methods: Explain/Verbal Responses: State content correctly Venous: Methods: Explain/Verbal Responses: State content correctly Welcome To The Wound Care Center: Methods: Explain/Verbal Responses: State content correctly Wound/Skin Impairment: Methods: Explain/Verbal Responses: State content correctly Electronic Signature(s) Signed: 12/18/2015 4:22:45 PM By: Elpidio EricAfful, Rita BSN, RN Entered By: Elpidio EricAfful, Rita on 12/18/2015 14:40:20 Gerdeman, [name] ([id number]) -------------------------------------------------------------------------------- Wound Assessment Details Patient Name: [name], [name] Date of Service: 12/18/2015 1:30 PM Medical Record Number: [id number] Patient Account Number: [id number] Date of Birth/Sex: 09/22/1945 [age] y.o. Male) Treating RN: Clover MealyAfful, RN, BSN, Rita Primary Care Physician: Etheleen NicksMACDONALD,  KERI Other Clinician: Referring Physician: Etheleen NicksMACDONALD, KERI Treating Physician/Extender: Kathreen Cosieroulter, Leah Weeks in Treatment: 3 Wound Status Wound Number: 5 Primary Etiology: Venous Leg Ulcer Wound Location: Right Malleolus - Lateral Wound Status: Open Wounding Event: Gradually Appeared Comorbid Anemia, Sleep Apnea, History: Hypertension Date Acquired: 11/06/2015 Weeks Of Treatment: 3 Clustered Wound: No Photos Photo Uploaded By: Elpidio EricAfful, Rita on 12/18/2015 16:20:04 Wound Measurements Length: (cm) 2.5 Width: (cm) 0.8 Depth: (cm) 0.2 Area: (cm) 1.571 Volume: (cm) 0.314 % Reduction in Area: 64% % Reduction in Volume: 64% Epithelialization: Medium (34-66%) Tunneling: No Undermining: No Wound Description Full Thickness Without Exposed Classification: Support Structures Wound Margin: Distinct, outline attached Exudate Medium Amount: Exudate Type: Serosanguineous Exudate Color: red, brown Foul Odor After Cleansing: No Wound Bed Granulation Amount: Large (67-100%) Exposed Structure Granulation Quality: Pink Fascia Exposed: No Necrotic Amount: Small (1-33%) Fat Layer Exposed: No [place], [place] R. ([id number]) Necrotic Quality: Adherent Slough Tendon Exposed: No Muscle Exposed: No Joint Exposed: No Bone Exposed: No Limited to Skin Breakdown Periwound Skin Texture Texture Color No Abnormalities Noted: No No Abnormalities Noted: No Callus: No Atrophie Blanche: No Crepitus: No Cyanosis: No Excoriation: No Ecchymosis: No Fluctuance: No Erythema: No Friable: No Hemosiderin Staining: Yes Induration: No Mottled: Yes Localized Edema: Yes Pallor: No Rash: No Rubor: No Scarring: No Temperature / Pain Moisture Temperature: No Abnormality No Abnormalities Noted: No Tenderness on Palpation: Yes Dry / Scaly: No Maceration: No Moist: Yes Wound Preparation Ulcer Cleansing: Rinsed/Irrigated with Saline, Other: surg scrub with water, Topical Anesthetic  Applied: Other: lidocaine 4%, Treatment Notes Wound #5 (Right, Lateral Malleolus) 1. Cleansed with: Clean wound with Normal Saline 4. Dressing Applied: Prisma Ag 5. Secondary Dressing Applied ABD Pad 7. Secured with 3 Layer Compression System - Right Lower Extremity Electronic Signature(s) Signed: 12/18/2015 4:22:45 PM By: Elpidio EricAfful, Rita BSN, RN Entered By: Elpidio EricAfful, Rita on 12/18/2015 14:15:17 Ferdinand, [name] ([id number]) -------------------------------------------------------------------------------- Vitals Details Patient Name: [name], Zyon R. Date of Service:  12/18/2015 1:30 PM Medical Record Number: [id number] Patient Account Number: [id number] Date of Birth/Sex: 06/22/1945 (69 y.o. Male) Treating RN: Afful, RN, BSN, Rita Primary Care Physician: Etheleen Nicks Other Clinician: Referring Physician: Etheleen Nicks Treating Physician/Extender: Kathreen Cosier in Treatment: 3 Vital Signs Time Taken: 13:43 Temperature (F): 97.8 Height (in): 70 Pulse (bpm): 77 Weight (lbs): 330 Respiratory Rate (breaths/min): 18 Body Mass Index (BMI): 47.3 Blood Pressure (mmHg): 132/73 Reference Range: 80 - 120 mg / dl Electronic Signature(s) Signed: 12/18/2015 4:22:45 PM By: Elpidio Eric BSN, RN Entered By: Elpidio Eric on 12/18/2015 13:43:47

## 2015-12-25 ENCOUNTER — Encounter: Payer: Medicare Other | Admitting: Internal Medicine

## 2015-12-25 DIAGNOSIS — S81811S Laceration without foreign body, right lower leg, sequela: Secondary | ICD-10-CM | POA: Diagnosis not present

## 2015-12-25 NOTE — Progress Notes (Signed)
[name], [name] ([id number]) Visit Report for 12/18/2015 Chief Complaint Document Details Patient Name: [name], [name] Date of Service: 12/18/2015 1:30 PM Medical Record Number: [id number] Patient Account Number: [id number] Date of Birth/Sex: 08/24/1945 [age] y.o. Male) Treating RN: Clover MealyAfful, RN, BSN, [place] Primary Care Physician: Etheleen NicksMACDONALD, KERI Other Clinician: Referring Physician: Etheleen NicksMACDONALD, KERI Treating Physician/Extender: Kathreen Cosieroulter, Araseli Sherry Weeks in Treatment: 3 Information Obtained from: Patient Chief Complaint presents for follow-up of right lower extremity venous ulceration Electronic Signature(s) Signed: 12/18/2015 3:11:53 PM By: Penne Lashoulter, NP, Cosby Proby Entered By: Penne Lashoulter, NP, Tyvion Edmondson on 12/18/2015 15:11:53 Schellinger, [name] ([id number]) -------------------------------------------------------------------------------- Debridement Details Patient Name: [name], [name] Date of Service: 12/18/2015 1:30 PM Medical Record Number: [id number] Patient Account Number: [id number] Date of Birth/Sex: 02/05/1945 [age] y.o. Male) Treating RN: Clover MealyAfful, RN, BSN, Rita Primary Care Physician: Etheleen NicksMACDONALD, KERI Other Clinician: Referring Physician: Etheleen NicksMACDONALD, KERI Treating Physician/Extender: Kathreen Cosieroulter, Zaryia Markel Weeks in Treatment: 3 Debridement Performed for Wound #5 Right,Lateral Malleolus Assessment: Performed By: Physician Bonnell Publicoulter, Nohealani Medinger, NP Debridement: Open Wound/Selective Debridement Selective Description: Pre-procedure Yes - 14:16 Verification/Time Out Taken: Start Time: 14:16 Pain Control: Lidocaine 4% Topical Solution Level: Non-Viable Tissue Total Area Debrided (L x 2.5 (cm) x 0.8 (cm) = 2 (cm) W): Tissue and other Non-Viable, Fibrin/Slough, Subcutaneous material debrided: Instrument: Curette Bleeding: Minimum Hemostasis Achieved: Pressure End Time: 14:18 Procedural Pain: 0 Post Procedural Pain: 0 Response to Treatment: Procedure was tolerated well Post Debridement Measurements of Total  Wound Length: (cm) 2.5 Width: (cm) 0.8 Depth: (cm) 0.2 Volume: (cm) 0.314 Character of Wound/Ulcer Post Stable Debridement: Severity of Tissue Post Debridement: Fat layer exposed Post Procedure Diagnosis Same as Pre-procedure Electronic Signature(s) Signed: 12/18/2015 4:22:45 PM By: Elpidio EricAfful, Rita BSN, RN Signed: 12/25/2015 9:25:32 AM By: Penne Lashoulter, NP, Damaris SchoonerLeah Matt, [name] ([id number]) Entered By: Elpidio EricAfful, Rita on 12/18/2015 14:18:01 [name], [name] ([id number]) -------------------------------------------------------------------------------- HPI Details Patient Name: [name], [name] Date of Service: 12/18/2015 1:30 PM Medical Record Number: [id number] Patient Account Number: [id number] Date of Birth/Sex: 11/01/1945 [age] y.o. Male) Treating RN: Clover MealyAfful, RN, BSN, Rita Primary Care Physician: Etheleen NicksMACDONALD, KERI Other Clinician: Referring Physician: Etheleen NicksMACDONALD, KERI Treating Physician/Extender: Kathreen Cosieroulter, Tiffine Henigan Weeks in Treatment: 3 History of Present Illness Location: right lower extremity Quality: Patient has been experiencing much less pain currently Severity: 1 out of 10 Duration: Patient has had the wound for approximately 3 weeks prior to presenting for treatment Timing: Pain occurs most often with manipulation of the wound Context: Patient dropped a cardboard box which struck his leg causing the initial injury which hasn't healed since onset Modifying Factors: Currently patient has been using over-the-counter antibiotic ointment Associated Signs and Symptoms: Patient reports having difficulty standing for long periods. HPI Description: As noted above the patient had a injury due to working in the yard and this rapidly progressed to a ulcerated area with an infection. In October 2014 he did have some vascular studies done but never saw a Careers advisersurgeon and never had any surgery for varicose veins. His past medical history is significant for hypertension, obstructive sleep apnea, morbid obesity,  stasis dermatitis of both lower extremities and his past surgical history is suggestive of hip surgery on the right, gastric bypass surgery, cholecystectomy, hip replacement. He has recently been put on Levaquin 500 milligrams daily, Bactrim DS 1 twice a day and Bactroban 2% topical ointment locally. No recent labs or vascular or imaging studies done. 07/11/2014 a venous duplex study was done on 07/05/2014 -- it showed no evidence of deep vein thrombosis or superficial thrombosis bilaterally. There was incompetence of  the greater saphenous veins bilaterally and incompetence of the right small saphenous vein. The patient has an appointment to see the vascular surgeons on June 20. 07/18/2014 his appointment with the vascular surgeons is rescheduled for this afternoon. 07/25/2014 -- he saw the vascular surgeon and is going to have a endovenous procedure done on July 29. 08/01/2014 -- he was diagnosed with a UTI and is on ciprofloxacin for 10 days. readmission: 11/27/15 On evaluation today patient is having discomfort in the right ankle region following an open wound from having dropped a cardboard box striking his ankle. He tells me that this has not healed despite many of the measures that he learned from previous injuries when he was seen at our wound center. Therefore he didn't come in for reevaluation today. He has not noted any significant purulent discharge at this point in time. 12/04/15 patient appears to be doing somewhat better at this point in time today in regard to his ankle wound. it is measuring smaller and looking cleaner. 12/11/15; patient wound continues to look improve this is on the right lateral ankle. Debrided of surface slough and nonviable subcutaneous tissue. Using silver alginate [name], [name] ([id number]) 12-18-15 Mr. Revelo presents today with his wife for follow-up on right lower extremity venous ulcer. He denies any changes or complications over the past week. He  is tolerating compression therapy to his right lower extremity and has a properly fitting compression hose to the left lower extremity. Electronic Signature(s) Signed: 12/18/2015 3:17:12 PM By: Penne Lash, NP, Reggie Pile By: Penne Lash, NP, Taylor Spilde on 12/18/2015 15:17:11 [name] ([id number]) -------------------------------------------------------------------------------- Physical Exam Details Patient Name: [name]. Date of Service: 12/18/2015 1:30 PM Medical Record Number: [id number] Patient Account Number: [id number] Date of Birth/Sex: 25-Apr-1945 (70 y.o. Male) Treating RN: Clover Mealy, RN, BSN, [place] Sink Primary Care Physician: Etheleen Nicks Other Clinician: Referring Physician: Etheleen Nicks Treating Physician/Extender: Bonnell Public Weeks in Treatment: 3 Constitutional afebrile. well nourished; well developed; appears stated age;Marland Kitchen Respiratory non-labored respiratory effort. Cardiovascular palpable DP and PT; palpable popliteal. hemosiderin staining, RLE warm and well perfused with Refill less than 3 seconds. Musculoskeletal ambulates with cane. Integumentary (Hair, Skin) no periwound erythema, granulation tissue throughout the wound with small amount of biofilm. no induration, no fluctuance, denies pain. Psychiatric appears to make sound judgement and have accurate insight regarding healthcare. oriented to time, place, person and situation. calm, pleasant, conversive. Electronic Signature(s) Signed: 12/18/2015 3:18:33 PM By: Penne Lash, NP, Reggie Pile By: Penne Lash, NP, Tricia Pledger on 12/18/2015 15:18:32 [name] ([id number]) -------------------------------------------------------------------------------- Physician Orders Details Patient Name: [name]. Date of Service: 12/18/2015 1:30 PM Medical Record Number: [id number] Patient Account Number: [id number] Date of Birth/Sex: 19-Feb-1945 (70 y.o. Male) Treating RN: Clover Mealy, RN, BSN, [place] Sink Primary Care Physician: Etheleen Nicks Other Clinician: Referring Physician: Etheleen Nicks Treating Physician/Extender: Kathreen Cosier in Treatment: 3 Verbal / Phone Orders: Yes Clinician: Afful, RN, BSN, Rita Read Back and Verified: Yes Diagnosis Coding Wound Cleansing Wound #5 Right,Lateral Malleolus o Clean wound with Normal Saline. o May shower with protection. o No tub bath. Anesthetic Wound #5 Right,Lateral Malleolus o Topical Lidocaine 4% cream applied to wound bed prior to debridement Primary Wound Dressing Wound #5 Right,Lateral Malleolus o Prisma Ag Secondary Dressing Wound #5 Right,Lateral Malleolus o ABD pad Dressing Change Frequency Wound #5 Right,Lateral Malleolus o Change dressing every week Follow-up Appointments Wound #5 Right,Lateral Malleolus o Return Appointment in 1 week. Edema Control Wound #5 Right,Lateral Malleolus o 3 Layer Compression System - Right Lower  Extremity Additional Orders / Instructions Wound #5 Right,Lateral Malleolus o Increase protein intake. o Activity as tolerated o Other: - MVI, Vit A, C, Zinc SLAYTER, [place] ([id number]) Electronic Signature(s) Signed: 12/18/2015 4:22:45 PM By: Elpidio Eric BSN, RN Signed: 12/25/2015 9:25:32 AM By: Penne Lash, NP, Tsuneo Faison Entered By: Elpidio Eric on 12/18/2015 14:18:31 Granberry, [name] ([id number]) -------------------------------------------------------------------------------- Problem List Details Patient Name: [name], [name] Date of Service: 12/18/2015 1:30 PM Medical Record Number: [id number] Patient Account Number: [id number] Date of Birth/Sex: July 19, 1945 (70 y.o. 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 Date of Service: 12/18/2015 1:30 PM Medical Record Number: [id number] Patient Account Number: [id number] Date of Birth/Sex: [birth year] (70 y.o. Male) Treating RN: Clover Mealy, RN, BSN, Rita Primary Care Physician: Etheleen Nicks Other Clinician: Referring Physician: Etheleen Nicks Treating Physician/Extender: Kathreen Cosier in Treatment: 3 Subjective Chief Complaint Information obtained from Patient presents for follow-up of right lower extremity venous ulceration History of Present Illness (HPI) The following HPI elements were documented for the patient's wound: Location: right lower extremity Quality: Patient has been experiencing much less pain currently Severity: 1 out of 10 Duration: Patient has had the wound for approximately 3 weeks prior to presenting for treatment Timing: Pain occurs most often with manipulation of the wound Context: Patient dropped a cardboard box which struck his leg causing the initial injury which hasn't healed since onset Modifying Factors: Currently patient has been using over-the-counter antibiotic ointment Associated Signs and Symptoms: Patient reports having difficulty standing for long periods. As noted above the patient had a injury due to working in the yard and this rapidly progressed to a ulcerated area with an infection. In October 2014 he did have some vascular studies done but  never saw a Careers adviser and never had any surgery for varicose veins. His past medical history is significant for hypertension, obstructive sleep apnea, morbid obesity, stasis dermatitis of both lower extremities and his past surgical history is suggestive of hip surgery on the right, gastric bypass surgery, cholecystectomy, hip replacement. He has recently been put on Levaquin 500 milligrams daily, Bactrim DS 1 twice a day and Bactroban 2% topical ointment locally. No recent labs or vascular or imaging studies done. 07/11/2014 a venous duplex study was done on 07/05/2014 -- it showed no evidence of deep vein thrombosis or superficial thrombosis bilaterally. 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 The patient has an appointment to see the vascular surgeons on June 20. 07/18/2014 his appointment with the vascular surgeons is rescheduled for this afternoon. 07/25/2014 -- he saw the vascular surgeon and is going to have a endovenous procedure done on July 29. 08/01/2014 -- he was diagnosed with a UTI and is on ciprofloxacin for 10 days. readmission: 11/27/15 On evaluation today patient is having discomfort in the right ankle region following an open wound [name], [name] ([id number]) from  having dropped a cardboard box striking his ankle. He tells me that this has not healed despite many of the measures that he learned from previous injuries when he was seen at our wound center. Therefore he didn't come in for reevaluation today. He has not noted any significant purulent discharge at this point in time. 12/04/15 patient appears to be doing somewhat better at this point in time today in regard to his ankle wound. it is measuring smaller and looking cleaner. 12/11/15; patient wound continues to look improve this is on the right lateral ankle. Debrided of surface slough and nonviable subcutaneous tissue. Using silver  alginate 12-18-15 [name] presents today with his wife for follow-up on right lower extremity venous ulcer. He denies any changes or complications over the past week. He is tolerating compression therapy to his right lower extremity and has a properly fitting compression hose to the left lower extremity. Objective Constitutional afebrile. well nourished; well developed; appears stated age;Marland Kitchen. Vitals Time Taken: 1:43 PM, Height: 70 in, Weight: 330 lbs, BMI: 47.3, Temperature: 97.8 F, Pulse: 77 bpm, Respiratory Rate: 18 breaths/min, Blood Pressure: 132/73 mmHg. Respiratory non-labored respiratory effort. Cardiovascular palpable DP and PT; palpable popliteal. hemosiderin staining, RLE warm and well perfused with Refill less than 3 seconds. Musculoskeletal ambulates with cane. Psychiatric appears to make sound judgement and have accurate insight regarding healthcare. oriented to time, place, person and situation. calm, pleasant, conversive. Integumentary (Hair, Skin) no periwound erythema, granulation tissue throughout the wound with small amount of biofilm. no induration, no fluctuance, denies pain. Wound #5 status is Open. Original cause of wound was Gradually Appeared. The wound is located on the Right,Lateral Malleolus. The wound measures 2.5cm length x 0.8cm width x 0.2cm depth; 1.571cm^2 area and 0.314cm^3 volume. The wound is limited to skin breakdown. There is no tunneling or undermining [name], Jibril R. ([id number]) noted. There is a medium amount of serosanguineous drainage noted. The wound margin is distinct with the outline attached to the wound base. There is large (67-100%) pink granulation within the wound bed. There is a small (1-33%) amount of necrotic tissue within the wound bed including Adherent Slough. The periwound skin appearance exhibited: Localized Edema, Moist, Hemosiderin Staining, Mottled. The periwound skin appearance did not exhibit: Callus, Crepitus,  Excoriation, Fluctuance, Friable, Induration, Rash, Scarring, Dry/Scaly, Maceration, Atrophie Blanche, Cyanosis, Ecchymosis, Pallor, Rubor, Erythema. Periwound temperature was noted as No Abnormality. The periwound has tenderness on palpation. Assessment Active Problems ICD-10 S81.811S - Laceration without foreign body, right lower leg, sequela I87.2 - Venous insufficiency (chronic) (peripheral) L97.312 - Non-pressure chronic ulcer of right ankle with fat layer exposed E66.01 - Morbid (severe) obesity due to excess calories Procedures Wound #5 Wound #5 is a Venous Leg Ulcer located on the Right,Lateral Malleolus . There was a Non-Viable Tissue Open Wound/Selective [phone]) debridement with total area of 2 sq cm performed by Bonnell Publicoulter, Tyke Outman, NP. with the following instrument(s): Curette to remove Non-Viable tissue/material including Fibrin/Slough and Subcutaneous after achieving pain control using Lidocaine 4% Topical Solution. A time out was conducted at 14:16, prior to the start of the procedure. A Minimum amount of bleeding was controlled with Pressure. The procedure was tolerated well with a pain level of 0 throughout and a pain level of 0 following the procedure. Post Debridement Measurements: 2.5cm length x 0.8cm width x 0.2cm depth; 0.314cm^3 volume. Character of Wound/Ulcer Post Debridement is stable. Severity of Tissue Post Debridement is: Fat layer exposed. Post procedure Diagnosis Wound #5: Same as Pre-Procedure Plan Wound  Cleansing: Heidinger, Lucia R. ([id number]) Wound #5 Right,Lateral Malleolus: Clean wound with Normal Saline. May shower with protection. No tub bath. Anesthetic: Wound #5 Right,Lateral Malleolus: Topical Lidocaine 4% cream applied to wound bed prior to debridement Primary Wound Dressing: Wound #5 Right,Lateral Malleolus: Prisma Ag Secondary Dressing: Wound #5 Right,Lateral Malleolus: ABD pad Dressing Change Frequency: Wound #5 Right,Lateral  Malleolus: Change dressing every week Follow-up Appointments: Wound #5 Right,Lateral Malleolus: Return Appointment in 1 week. Edema Control: Wound #5 Right,Lateral Malleolus: 3 Layer Compression System - Right Lower Extremity Additional Orders / Instructions: Wound #5 Right,Lateral Malleolus: Increase protein intake. Activity as tolerated Other: - MVI, Vit A, C, Zinc Follow-Up Appointments: A Patient Clinical Summary of Care was provided to GS 1. will change from alginate to Prisma 2. continue with weekly follow-ups 3. Continue with compression therapy bilaterally with stockings to left lower extremity and multilayer wraps the right lower extremity Electronic Signature(s) Signed: 12/18/2015 3:19:32 PM By: Penne Lash, NP, [place] Saber Entered By: Penne Lash, NP, Anselmo Reihl on 12/18/2015 15:19:32 Liew, [name] ([id number]) [name], [name] ([id number]) -------------------------------------------------------------------------------- SuperBill Details Patient Name: [name] Date of Service: 12/18/2015 Medical Record Number: [id number] Patient Account Number: [id number] Date of Birth/Sex: [birth year] (70 y.o. Male) Treating RN: Afful, RN, BSN, Rita Primary Care Physician: Etheleen Nicks Other Clinician: Referring Physician: Etheleen Nicks Treating Physician/Extender: Kathreen Cosier in Treatment: 3 Diagnosis Coding ICD-10 Codes Code Description [phone] Laceration without foreign body, right lower leg, sequela I87.2 Venous insufficiency (chronic) (peripheral) L97.312 Non-pressure chronic ulcer of right ankle with fat layer exposed E66.01 Morbid (severe) obesity due to excess calories Facility Procedures CPT4 Code: [id number] Description: [phone] - DEBRIDE WOUND 1ST 20 SQ CM OR < ICD-10 Description Diagnosis S81.811S Laceration without foreign body, right lower leg, I87.2 Venous insufficiency (chronic) (peripheral) Modifier: sequela Quantity: 1 Physician Procedures CPT4 Code:  [id number] Description: 97597 - WC PHYS DEBR WO ANESTH 20 SQ CM ICD-10 Description Diagnosis S81.811S Laceration without foreign body, right lower leg, I87.2 Venous insufficiency (chronic) (peripheral) Modifier: sequela Quantity: 1 Electronic Signature(s) Signed: 12/18/2015 3:19:47 PM By: Penne Lash, NP, Markos Theil Entered By: Penne Lash, NP, Demba Nigh on 12/18/2015 15:19:47

## 2015-12-26 NOTE — Progress Notes (Signed)
[name], Burle R. ([id number]) Visit Report for 12/25/2015 Arrival Information Details Patient Name: [name], [name] Date of Service: 12/25/2015 8:15 AM Medical Record Number: [id number] Patient Account Number: [id number] Date of Birth/Sex: 06/12/1945 [age] y.o. Male) Treating RN: Clover MealyAfful, RN, BSN, [place] Primary Care Physician: Etheleen NicksMACDONALD, KERI Other Clinician: Referring Physician: Etheleen NicksMACDONALD, KERI Treating Physician/Extender: Altamese CarolinaOBSON, MICHAEL G Weeks in Treatment: 4 Visit Information History Since Last Visit All ordered tests and consults were completed: No Patient Arrived: [name] Added or deleted any medications: No Arrival Time: 08:20 Any new allergies or adverse reactions: No Accompanied By: wife Had a fall or experienced change in No Transfer Assistance: None activities of daily living that may affect Patient Identification Verified: Yes risk of falls: Secondary Verification Process Completed: Yes Signs or symptoms of abuse/neglect since last No Patient Requires Transmission-Based No visito Precautions: Hospitalized since last visit: No Patient Has Alerts: No Has Dressing in Place as Prescribed: Yes Has Compression in Place as Prescribed: Yes Pain Present Now: No Electronic Signature(s) Signed: 12/25/2015 10:46:49 AM By: Elpidio EricAfful, Rita BSN, RN Entered By: Elpidio EricAfful, Rita on 12/25/2015 08:20:43 [name], [name] ([id number]) -------------------------------------------------------------------------------- Encounter Discharge Information Details Patient Name: [name], [name] Date of Service: 12/25/2015 8:15 AM Medical Record Number: [id number] Patient Account Number: [id number] Date of Birth/Sex: 12/17/1945 [age] y.o. Male) Treating RN: Clover MealyAfful, RN, BSN, [place] Primary Care Physician: Etheleen NicksMACDONALD, KERI Other Clinician: Referring Physician: Etheleen NicksMACDONALD, KERI Treating Physician/Extender: Altamese CarolinaOBSON, MICHAEL G Weeks in Treatment: 4 Encounter Discharge Information Items Discharge Pain Level: 0 Discharge  Condition: Stable Ambulatory Status: Cane Discharge Destination: Home Transportation: Private Auto Accompanied By: wife Schedule Follow-up Appointment: No Medication Reconciliation completed and provided to Patient/Care No Ola Fawver: Provided on Clinical Summary of Care: 12/25/2015 Form Type Recipient Paper Patient GS Electronic Signature(s) Signed: 12/25/2015 8:52:22 AM By: Gwenlyn PerkingMoore, Shelia Entered By: Gwenlyn PerkingMoore, Shelia on 12/25/2015 08:52:22 Skoczylas, [name] ([id number]) -------------------------------------------------------------------------------- Lower Extremity Assessment Details Patient Name: [name], [name] Date of Service: 12/25/2015 8:15 AM Medical Record Number: [id number] Patient Account Number: [id number] Date of Birth/Sex: 06/13/1945 [age] y.o. Male) Treating RN: Clover MealyAfful, RN, BSN, Rita Primary Care Physician: Etheleen NicksMACDONALD, KERI Other Clinician: Referring Physician: Etheleen NicksMACDONALD, KERI Treating Physician/Extender: Maxwell CaulOBSON, MICHAEL G Weeks in Treatment: 4 Edema Assessment Assessed: [Left: No] [Right: No] Edema: [Left: N] [Right: o] Calf Left: Right: Point of Measurement: 35 cm From Medial Instep cm 42 cm Ankle Left: Right: Point of Measurement: 9 cm From Medial Instep cm 26 cm Vascular Assessment Claudication: Claudication Assessment [Right:None] Pulses: Posterior Tibial Dorsalis Pedis Palpable: [Right:Yes] Extremity colors, hair growth, and conditions: Extremity Color: [Right:Mottled] Temperature of Extremity: [Right:Warm] Capillary Refill: [Right:< 3 seconds] Toe Nail Assessment Left: Right: Thick: No Discolored: No Deformed: No Improper Length and Hygiene: No Electronic Signature(s) Signed: 12/25/2015 10:46:49 AM By: Elpidio EricAfful, Rita BSN, RN Entered By: Elpidio EricAfful, Rita on 12/25/2015 08:24:36 [name], [name] ([id number]) [name], [name] -------------------------------------------------------------------------------- Multi Wound Chart Details Patient Name:  [name], [name] Date of Service: 12/25/2015 8:15 AM Medical Record Number: [id number] Patient Account Number: [id number] Date of Birth/Sex: 06/29/1945 [age] y.o. Male) Treating RN: Clover MealyAfful, RN, BSN, [place] Primary Care Physician: Etheleen NicksMACDONALD, KERI Other Clinician: Referring Physician: Etheleen NicksMACDONALD, KERI Treating Physician/Extender: Maxwell CaulOBSON, MICHAEL G Weeks in Treatment: 4 Vital Signs Height(in): 70 Pulse(bpm): 75 Weight(lbs): 330 Blood Pressure 153/77 (mmHg): Body Mass Index(BMI): 47 Temperature(F): 97.6 Respiratory Rate 18 (breaths/min): Photos: [5:No Photos] [N/A:N/A] Wound Location: [5:Right Malleolus - Lateral] [N/A:N/A] Wounding Event: [5:Gradually Appeared] [N/A:N/A] Primary Etiology: [5:Venous Leg Ulcer] [N/A:N/A] Comorbid History: [5:Anemia, Sleep Apnea, Hypertension] [N/A:N/A] Date Acquired: [  5:11/06/2015] [N/A:N/A] Weeks of Treatment: [5:4] [N/A:N/A] Wound Status: [5:Open] [N/A:N/A] Measurements L x W x D 2x0.5x0.2 [N/A:N/A] (cm) Area (cm) : [5:0.785] [N/A:N/A] Volume (cm) : [5:0.157] [N/A:N/A] % Reduction in Area: [5:82.00%] [N/A:N/A] % Reduction in Volume: 82.00% [N/A:N/A] Classification: [5:Full Thickness Without Exposed Support Structures] [N/A:N/A] Exudate Amount: [5:Medium] [N/A:N/A] Exudate Type: [5:Serosanguineous] [N/A:N/A] Exudate Color: [5:red, brown] [N/A:N/A] Wound Margin: [5:Distinct, outline attached] [N/A:N/A] Granulation Amount: [5:Large (67-100%)] [N/A:N/A] Granulation Quality: [5:Pink] [N/A:N/A] Necrotic Amount: [5:Small (1-33%)] [N/A:N/A] Exposed Structures: [5:Fascia: No Fat: No Tendon: No Muscle: No Joint: No] [N/A:N/A] Bone: No Limited to Skin Breakdown Epithelialization: Medium (34-66%) N/A N/A Periwound Skin Texture: Edema: Yes N/A N/A Excoriation: No Induration: No Callus: No Crepitus: No Fluctuance: No Friable: No Rash: No Scarring: No Periwound Skin Moist: Yes N/A N/A Moisture: Maceration: No Dry/Scaly: No Periwound Skin  Color: Hemosiderin Staining: Yes N/A N/A Mottled: Yes Atrophie Blanche: No Cyanosis: No Ecchymosis: No Erythema: No Pallor: No Rubor: No Temperature: No Abnormality N/A N/A Tenderness on Yes N/A N/A Palpation: Wound Preparation: Ulcer Cleansing: N/A N/A Rinsed/Irrigated with Saline, Other: surg scrub with water Topical Anesthetic Applied: Other: lidocaine 4% Treatment Notes Electronic Signature(s) Signed: 12/25/2015 10:46:49 AM By: Elpidio Eric BSN, RN Entered By: Elpidio Eric on 12/25/2015 08:37:59 [name], [name] ([id number]) -------------------------------------------------------------------------------- Multi-Disciplinary Care Plan Details Patient Name: [name] Alma. Date of Service: 12/25/2015 8:15 AM Medical Record Number: [id number] Patient Account Number: [id number] Date of Birth/Sex: 11-07-1945 (70 y.o. Male) Treating RN: Clover Mealy, RN, BSN, [place] Primary Care Physician: Etheleen Nicks Other Clinician: Referring Physician: Etheleen Nicks Treating Physician/Extender: Altamese [place] in Treatment: 4 Active Inactive Orientation to the Wound Care Program Nursing Diagnoses: Knowledge deficit related to the wound healing center program Goals: Patient/caregiver will verbalize understanding of the Wound Healing Center Program Date Initiated: 11/27/2015 Goal Status: Active Interventions: Provide education on orientation to the wound center Notes: Venous Leg Ulcer Nursing Diagnoses: Knowledge deficit related to disease process and management Potential for venous Insuffiency (use before diagnosis confirmed) Goals: Non-invasive venous studies are completed as ordered Date Initiated: 11/27/2015 Goal Status: Active Patient will maintain optimal edema control Date Initiated: 11/27/2015 Goal Status: Active Verify adequate tissue perfusion prior to therapeutic compression application Date Initiated: 11/27/2015 Goal Status: Active Interventions: Assess  peripheral edema status every visit. Compression as ordered Provide education on venous insufficiency Elsayed, Paarth R. ([id number]) Treatment Activities: Therapeutic compression applied : 11/27/2015 Notes: Wound/Skin Impairment Nursing Diagnoses: Impaired tissue integrity Knowledge deficit related to ulceration/compromised skin integrity Goals: Patient will have a decrease in wound volume by X% from date: (specify in notes) Date Initiated: 11/27/2015 Goal Status: Active Ulcer/skin breakdown will have a volume reduction of 30% by week 4 Date Initiated: 11/27/2015 Goal Status: Active Ulcer/skin breakdown will have a volume reduction of 50% by week 8 Date Initiated: 11/27/2015 Goal Status: Active Ulcer/skin breakdown will have a volume reduction of 80% by week 12 Date Initiated: 11/27/2015 Goal Status: Active Ulcer/skin breakdown will heal within 14 weeks Date Initiated: 11/27/2015 Goal Status: Active Interventions: Assess patient/caregiver ability to obtain necessary supplies Assess patient/caregiver ability to perform ulcer/skin care regimen upon admission and as needed Assess ulceration(s) every visit Provide education on ulcer and skin care Treatment Activities: Skin care regimen initiated : 11/27/2015 Topical wound management initiated : 11/27/2015 Notes: Electronic Signature(s) Signed: 12/25/2015 10:46:49 AM By: Elpidio Eric BSN, RN Entered By: Elpidio Eric on 12/25/2015 08:37:51 Ragas, [name] ([id number]) Spadafora, [name] ([id number]) -------------------------------------------------------------------------------- Pain Assessment Details Patient Name: [name] Alma. Date of  Service: 12/25/2015 8:15 AM Medical Record Number: [id number] Patient Account Number: [id number] Date of Birth/Sex: 08/26/1945 (70 y.o. Male) Treating RN: Clover Mealy, RN, BSN, [place] Primary Care Physician: Etheleen Nicks Other Clinician: Referring Physician: Etheleen Nicks Treating  Physician/Extender: Altamese [place] in Treatment: 4 Active Problems Location of Pain Severity and Description of Pain Patient Has Paino No Site Locations With Dressing Change: No Pain Management and Medication Current Pain Management: Electronic Signature(s) Signed: 12/25/2015 10:46:49 AM By: Elpidio Eric BSN, RN Entered By: Elpidio Eric on 12/25/2015 08:21:02 [name] Alma ([id number]) -------------------------------------------------------------------------------- Patient/Caregiver Education Details Patient Name: [name] Alma. Date of Service: 12/25/2015 8:15 AM Medical Record Number: [id number] Patient Account Number: [id number] Date of Birth/Gender: 1945-03-26 (70 y.o. Male) Treating RN: Clover Mealy, RN, BSN, [place] Primary Care Physician: Etheleen Nicks Other Clinician: Referring Physician: Etheleen Nicks Treating Physician/Extender: Altamese [place] in Treatment: 4 Education Assessment Education Provided To: Patient Education Topics Provided Venous: Methods: Explain/Verbal Responses: State content correctly Welcome To The Wound Care Center: Methods: Explain/Verbal Responses: State content correctly Wound/Skin Impairment: Methods: Explain/Verbal Responses: State content correctly Electronic Signature(s) Signed: 12/25/2015 10:46:49 AM By: Elpidio Eric BSN, RN Entered By: Elpidio Eric on 12/25/2015 08:41:06 Rouillard, [name] ([id number]) -------------------------------------------------------------------------------- Wound Assessment Details Patient Name: [name] Pen R. Date of Service: 12/25/2015 8:15 AM Medical Record Number: [id number] Patient Account Number: [id number] Date of Birth/Sex: [birth year] (70 y.o. Male) Treating RN: Clover Mealy, RN, BSN, Rita Primary Care Physician: Etheleen Nicks Other Clinician: Referring Physician: Etheleen Nicks Treating Physician/Extender: Maxwell Caul Weeks in Treatment: 4 Wound Status Wound Number: 5 Primary  Etiology: Venous Leg Ulcer Wound Location: Right Malleolus - Lateral Wound Status: Open Wounding Event: Gradually Appeared Comorbid Anemia, Sleep Apnea, History: Hypertension Date Acquired: 11/06/2015 Weeks Of Treatment: 4 Clustered Wound: No Photos Photo Uploaded By: Elpidio Eric on 12/25/2015 17:40:08 Wound Measurements Length: (cm) 2 Width: (cm) 0.5 Depth: (cm) 0.2 Area: (cm) 0.785 Volume: (cm) 0.157 % Reduction in Area: 82% % Reduction in Volume: 82% Epithelialization: Medium (34-66%) Tunneling: No Undermining: No Wound Description Full Thickness Without Exposed Classification: Support Structures Wound Margin: Distinct, outline attached Exudate Medium Amount: Exudate Type: Serosanguineous Exudate Color: red, brown Foul Odor After Cleansing: No Wound Bed Granulation Amount: Large (67-100%) Exposed Structure Granulation Quality: Pink Fascia Exposed: No Necrotic Amount: Small (1-33%) Fat Layer Exposed: No Louderback, Montez R. ([id number]) Necrotic Quality: Adherent Slough Tendon Exposed: No Muscle Exposed: No Joint Exposed: No Bone Exposed: No Limited to Skin Breakdown Periwound Skin Texture Texture Color No Abnormalities Noted: No No Abnormalities Noted: No Callus: No Atrophie Blanche: No Crepitus: No Cyanosis: No Excoriation: No Ecchymosis: No Fluctuance: No Erythema: No Friable: No Hemosiderin Staining: Yes Induration: No Mottled: Yes Localized Edema: Yes Pallor: No Rash: No Rubor: No Scarring: No Temperature / Pain Moisture Temperature: No Abnormality No Abnormalities Noted: No Tenderness on Palpation: Yes Dry / Scaly: No Maceration: No Moist: Yes Wound Preparation Ulcer Cleansing: Rinsed/Irrigated with Saline, Other: surg scrub with water, Topical Anesthetic Applied: Other: lidocaine 4%, Treatment Notes Wound #5 (Right, Lateral Malleolus) 1. Cleansed with: Cleanse wound with antibacterial soap and water 3. Peri-wound Care: Barrier  cream Moisturizing lotion 4. Dressing Applied: Prisma Ag 5. Secondary Dressing Applied ABD Pad 7. Secured with 3 Layer Compression System - Right Lower Extremity Electronic Signature(s) Signed: 12/25/2015 10:46:49 AM By: Elpidio Eric BSN, RN Entered By: Elpidio Eric on 12/25/2015 08:30:59 [name] Alma ([id number]) [name], [name] ([id number]) -------------------------------------------------------------------------------- Vitals Details Patient Name: [name] Alma. Date of Service: 12/25/2015 8:15  AM Medical Record Number: [id number] Patient Account Number: [id number] Date of Birth/Sex: 01/05/1946 [age] y.o. Male) Treating RN: Afful, RN, BSN, Rita Primary Care Physician: Etheleen NicksMACDONALD, KERI Other Clinician: Referring Physician: Etheleen NicksMACDONALD, KERI Treating Physician/Extender: Altamese CarolinaOBSON, MICHAEL G Weeks in Treatment: 4 Vital Signs Time Taken: 08:21 Temperature (F): 97.6 Height (in): 70 Pulse (bpm): 75 Weight (lbs): 330 Respiratory Rate (breaths/min): 18 Body Mass Index (BMI): 47.3 Blood Pressure (mmHg): 153/77 Reference Range: 80 - 120 mg / dl Electronic Signature(s) Signed: 12/25/2015 10:46:49 AM By: Elpidio EricAfful, Rita BSN, RN Entered By: Elpidio EricAfful, Rita on 12/25/2015 65:[id number]:4608:22:26

## 2015-12-26 NOTE — Progress Notes (Signed)
[name], [name] ([id number]) Visit Report for 12/25/2015 Chief Complaint Document Details Patient Name: [name], [name]. Date of Service: 12/25/2015 8:15 AM Medical Record Number: [id number] Patient Account Number: [id number] Date of Birth/Sex: [birth year] (70 y.o. Male) Treating [name]: [name] Mealy, [name], BSN, [name] Primary Care Physician: [name] Other Clinician: Referring Physician: Etheleen [name] Treating Physician/Extender: [name] in Treatment: 4 Information Obtained from: Patient Chief Complaint presents for follow-up of right lower extremity venous ulceration Electronic Signature(s) Signed: 12/25/2015 6:11:07 PM By: [name] Najjar MD Entered By: [name] on 12/25/2015 08:46:17 [name], [name] ([id number]) -------------------------------------------------------------------------------- Debridement Details Patient Name: [name]. Date of Service: 12/25/2015 8:15 AM Medical Record Number: [id number] Patient Account Number: [id number] Date of Birth/Sex: [birth year] (70 y.o. Male) Treating [name]: [name] Mealy, [name], BSN, Rita Primary Care Physician: [name] Other Clinician: Referring Physician: Etheleen [name] Treating Physician/Extender: [name] in Treatment: 4 Debridement Performed for Wound #5 Right,Lateral Malleolus Assessment: Performed By: Physician [name] Caul, MD Debridement: Debridement Pre-procedure Yes - 08:37 Verification/Time Out Taken: Start Time: 08:37 Pain Control: Lidocaine 4% Topical Solution Level: Skin/Subcutaneous Tissue Total Area Debrided ([name] x 2 (cm) x 0.5 (cm) = 1 (cm) W): Tissue and other Non-Viable, Fibrin/Slough, Subcutaneous material debrided: Instrument: Curette Bleeding: Minimum Hemostasis Achieved: Pressure End Time: 08:42 Procedural Pain: 0 Post Procedural Pain: 0 Response to Treatment: Procedure was tolerated well Post Debridement Measurements of Total Wound Length: (cm) 2 Width: (cm)  0.5 Depth: (cm) 0.2 Volume: (cm) 0.157 Character of Wound/Ulcer Post Stable Debridement: Severity of Tissue Post Debridement: Fat layer exposed Post Procedure Diagnosis Same as Pre-procedure Electronic Signature(s) Signed: 12/25/2015 10:46:49 AM By: [name] Signed: 12/25/2015 6:11:07 PM By: [name] Najjar MD Entered By: [name] on 12/25/2015 08:45:40 [name], [name] ([id number]) [name], [name] Kitchen ([id number]) -------------------------------------------------------------------------------- HPI Details Patient Name: [name]. Date of Service: 12/25/2015 8:15 AM Medical Record Number: [id number] Patient Account Number: [id number] Date of Birth/Sex: Nov 14, 1945 (70 y.o. Male) Treating [name]: [name] Mealy, [name], BSN, Rita Primary Care Physician: [name] Other Clinician: Referring Physician: Etheleen [name] Treating Physician/Extender: [name] [place] [name] in Treatment: 4 History of Present Illness Location: right lower extremity Quality: Patient has been experiencing much less pain currently Severity: 1 out of 10 Duration: Patient has had the wound for approximately 3 weeks prior to presenting for treatment Timing: Pain occurs most often with manipulation of the wound Context: Patient dropped a cardboard box which struck his leg causing the initial injury which hasn't healed since onset Modifying Factors: Currently patient has been using over-the-counter antibiotic ointment Associated Signs and Symptoms: Patient reports having difficulty standing for long periods. HPI Description: As noted above the patient had a injury due to working in the yard and this rapidly progressed to a ulcerated area with an infection. In October 2014 he did have some vascular studies done but never saw a Careers adviser and never had any surgery for varicose veins. His past medical history is significant for hypertension, obstructive sleep apnea, morbid obesity, stasis dermatitis of both  lower extremities and his past surgical history is suggestive of hip surgery on the right, gastric bypass surgery, cholecystectomy, hip replacement. He has recently been put on Levaquin 500 milligrams daily, Bactrim DS 1 twice a day and Bactroban 2% topical ointment locally. No recent labs or vascular or imaging studies done. 07/11/2014 a venous duplex study was done on 07/05/2014 -- it showed no evidence of deep vein thrombosis or superficial thrombosis bilaterally. There was incompetence of the  greater saphenous veins bilaterally and incompetence of the right small saphenous vein. The patient has an appointment to see the vascular surgeons on June 20. 07/18/2014 his appointment with the vascular surgeons is rescheduled for this afternoon. 07/25/2014 -- he saw the vascular surgeon and is going to have a endovenous procedure done on July 29. 08/01/2014 -- he was diagnosed with a UTI and is on ciprofloxacin for 10 days. readmission: 11/27/15 On evaluation today patient is having discomfort in the right ankle region following an open wound from having dropped a cardboard box striking his ankle. He tells me that this has not healed despite many of the measures that he learned from previous injuries when he was seen at our wound center. Therefore he didn't come in for reevaluation today. He has not noted any significant purulent discharge at this point in time. 12/04/15 patient appears to be doing somewhat better at this point in time today in regard to his ankle wound. it is measuring smaller and looking cleaner. 12/11/15; patient wound continues to look improve this is on the right lateral ankle. Debrided of surface slough and nonviable subcutaneous tissue. Using silver alginate [name], [name] ([id number]) 12-18-15 [name] presents today with his wife for follow-up on right lower extremity venous ulcer. He denies any changes or complications over the past week. He is tolerating compression  therapy to his right lower extremity and has a properly fitting compression hose to the left lower extremity. 12/25/15; patient with a wound on his right lateral lower extremity in the setting of severe venous insufficiency [originally trauma against the box]. His edema is well controlled he is using Prisma. Electronic Signature(s) Signed: 12/25/2015 6:11:07 PM By: [name] Najjarobson, Tarrell Debes MD Entered By: [name] Najjarobson, Shawntell Dixson on 12/25/2015 08:47:09 [name], [name] ([id number]) -------------------------------------------------------------------------------- Physical Exam Details Patient Name: [name], Jodi R. Date of Service: 12/25/2015 8:15 AM Medical Record Number: [id number] Patient Account Number: [id number] Date of Birth/Sex: 08/24/1945 [age] y.o. Male) Treating [name]: [name] MealyAfful, [name], BSN, [place] Primary Care Physician: [name] NicksMACDONALD, KERI Other Clinician: Referring Physician: Etheleen NicksMACDONALD, KERI Treating Physician/Extender: [name] CaulOBSON, Okey Zelek G Weeks in Treatment: 4 Constitutional Patient is hypertensive.. Pulse regular and within target range for patient.Marland Kitchen. Respirations regular, non-labored and within target range.. Temperature is normal and within the target range for the patient.. Patient's appearance is neat and clean. Appears in no acute distress. Well nourished and well developed.. Cardiovascular Pedal pulses palpable and strong bilaterally.. Edema present in both extremities. Severe bilateral venous insufficiency with hemosiderin deposition. Notes Wound exam; the area presented covered in a thick adherent surface which I think is a combination of eschar, drainage, dressing. Removed with a curet was some difficulty. After debridement the area looks to be mostly epithelialized. Still some small open areas. The area appears to be healing in a small divot Electronic Signature(s) Signed: 12/25/2015 6:11:07 PM By: [name] Najjarobson, Graylin Sperling MD Entered By: [name] Najjarobson, Samba Cumba on 12/25/2015 08:48:55 [name], [name]  ([id number]) -------------------------------------------------------------------------------- Physician Orders Details Patient Name: [name], Rock R. Date of Service: 12/25/2015 8:15 AM Medical Record Number: [id number] Patient Account Number: [id number] Date of Birth/Sex: 12/20/1945 [age] y.o. Male) Treating [name]: [name] MealyAfful, [name], BSN, [place] Primary Care Physician: [name] NicksMACDONALD, KERI Other Clinician: Referring Physician: Etheleen NicksMACDONALD, KERI Treating Physician/Extender: [name] CarolinaOBSON, Penny Frisbie G Weeks in Treatment: 4 Verbal / Phone Orders: Yes Clinician: Afful, [name], BSN, Rita Read Back and Verified: Yes Diagnosis Coding Wound Cleansing Wound #5 Right,Lateral Malleolus o Clean wound with Normal Saline. o May shower with protection. o No tub bath. Anesthetic Wound #5 Right,Lateral Malleolus o  Topical Lidocaine 4% cream applied to wound bed prior to debridement Primary Wound Dressing Wound #5 Right,Lateral Malleolus o Prisma Ag Secondary Dressing Wound #5 Right,Lateral Malleolus o ABD pad Dressing Change Frequency Wound #5 Right,Lateral Malleolus o Change dressing every week Follow-up Appointments Wound #5 Right,Lateral Malleolus o Return Appointment in 1 week. Edema Control Wound #5 Right,Lateral Malleolus o 3 Layer Compression System - Right Lower Extremity Additional Orders / Instructions Wound #5 Right,Lateral Malleolus o Increase protein intake. o Activity as tolerated o Other: - MVI, Vit A, C, Zinc [name], LUCKOW ([id number]) Electronic Signature(s) Signed: 12/25/2015 10:46:49 AM By: [name] Signed: 12/25/2015 6:11:07 PM By: [name] Najjar MD Entered By: [name] on 12/25/2015 08:39:46 [name], [name] ([id number]) -------------------------------------------------------------------------------- Problem List Details Patient Name: TEONDRE, JAROSZ. Date of Service: 12/25/2015 8:15 AM Medical Record Number: [id number] Patient Account Number:  [id number] Date of Birth/Sex: [birth year] (70 y.o. 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 Date of Service: 12/25/2015 8:15 AM Medical Record Number: [id number] Patient Account Number: [id number] Date of Birth/Sex: 1946/01/12 (70 y.o. Male) Treating [name]: [name] Mealy, [name], BSN, Rita Primary Care Physician: [name] Other Clinician: Referring Physician: Etheleen [name] Treating Physician/Extender: [name] Door in Treatment: 4 Subjective Chief Complaint Information obtained from Patient presents for follow-up of right lower extremity venous ulceration History of Present Illness (HPI) The following HPI elements were documented for the patient's wound: Location: right lower extremity Quality: Patient has been experiencing much less pain currently Severity: 1 out of 10 Duration: Patient has had the wound for approximately 3 weeks prior to presenting for treatment Timing: Pain occurs most often with manipulation of the wound Context: Patient dropped a cardboard box which struck his leg causing the initial injury which hasn't  healed since onset Modifying Factors: Currently patient has been using over-the-counter antibiotic ointment Associated Signs and Symptoms: Patient reports having difficulty standing for long periods. As noted above the patient had a injury due to working in the yard and this rapidly progressed to a ulcerated area with an infection. In October 2014 he did have some vascular studies done but never saw a Careers adviser and never had any surgery for varicose veins. His past medical history is significant for hypertension, obstructive sleep apnea, morbid obesity, stasis dermatitis of both lower extremities and his past surgical history is suggestive of hip surgery on the right, gastric bypass surgery, cholecystectomy, hip replacement. He has recently been put on Levaquin 500 milligrams daily, Bactrim DS 1 twice a day and Bactroban 2% topical ointment locally. No recent labs or vascular or imaging studies done. 07/11/2014 a venous duplex study was done on 07/05/2014 -- it showed no evidence of deep vein thrombosis or superficial thrombosis bilaterally. 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 The patient has an appointment to see the vascular surgeons on June 20.  07/18/2014 his appointment with the vascular surgeons is rescheduled for this afternoon. 07/25/2014 -- he saw the vascular surgeon and is going to have a endovenous procedure done on July 29. 08/01/2014 -- he was diagnosed with a UTI and is on ciprofloxacin for 10 days. readmission: 11/27/15 On evaluation today patient is having discomfort in the right ankle region following an open wound [name], [name] ([id number]) from having dropped a cardboard box striking his ankle. He tells me that this has not healed despite many of the measures that he learned from previous injuries when he was seen at our wound center. Therefore he didn't come in for reevaluation today. He has  not noted any significant purulent discharge at this point in time. 12/04/15 patient appears to be doing somewhat better at this point in time today in regard to his ankle wound. it is measuring smaller and looking cleaner. 12/11/15; patient wound continues to look improve this is on the right lateral ankle. Debrided of surface slough and nonviable subcutaneous tissue. Using silver alginate 12-18-15 Mr. Borre presents today with his wife for follow-up on right lower extremity venous ulcer. He denies any changes or complications over the past week. He is tolerating compression therapy to his right lower extremity and has a properly fitting compression hose to the left lower extremity. 12/25/15; patient with a wound on his right lateral lower extremity in the setting of severe venous insufficiency [originally trauma against the box]. His edema is well controlled he is using Prisma. Objective Constitutional Patient is hypertensive.. Pulse regular and within target range for patient.Marland Kitchen Respirations regular, non-labored and within target range.. Temperature is normal and within the target range for the patient.. Patient's appearance is neat and clean. Appears in no acute distress. Well nourished and well developed.. Vitals Time Taken: 8:21 AM, Height: 70 in, Weight: 330 lbs, BMI: 47.3, Temperature: 97.6 F, Pulse: 75 bpm, Respiratory Rate: 18 breaths/min, Blood Pressure: 153/77 mmHg. Cardiovascular Pedal pulses palpable and strong bilaterally.. Edema present in both extremities. Severe bilateral venous insufficiency with hemosiderin deposition. General Notes: Wound exam; the area presented covered in a thick adherent surface which I think is a combination of eschar, drainage, dressing. Removed with a curet was some difficulty. After debridement the area looks to be mostly epithelialized. Still some small open areas. The area appears to be healing in a small divot Integumentary (Hair, Skin) Wound  #5 status is Open. Original cause of wound was Gradually Appeared. The wound is located on the Right,Lateral Malleolus. The wound measures 2cm length x 0.5cm width x 0.2cm depth; 0.785cm^2 area and 0.157cm^3 volume. The wound is limited to skin breakdown. There is no tunneling or undermining noted. There is a medium amount of serosanguineous drainage noted. The wound margin is distinct with the outline attached to the wound base. There is large (67-100%) pink granulation within the wound bed. There is a small (1-33%) amount of necrotic tissue within the wound bed including Adherent Slough. The periwound skin appearance exhibited: Localized Edema, Moist, Hemosiderin Staining, Mottled. The periwound skin appearance did not exhibit: Callus, Crepitus, Excoriation, Fluctuance, Friable, Induration, Kye, Arpan R. ([id number]) Rash, Scarring, Dry/Scaly, Maceration, Atrophie Blanche, Cyanosis, Ecchymosis, Pallor, Rubor, Erythema. Periwound temperature was noted as No Abnormality. The periwound has tenderness on palpation. Assessment Active Problems ICD-10 S81.811S - Laceration without foreign body, right lower leg, sequela I87.2 - Venous insufficiency (chronic) (peripheral) L97.312 - Non-pressure chronic ulcer of right ankle with fat layer exposed E66.01 - Morbid (severe) obesity due to excess calories  Procedures Wound #5 Wound #5 is a Venous Leg Ulcer located on the Right,Lateral Malleolus . There was a Skin/Subcutaneous Tissue Debridement ([id number]) debridement with total area of 1 sq cm performed by [name] CaulOBSON, Kataya Guimont G, MD. with the following instrument(s): Curette to remove Non-Viable tissue/material including Fibrin/Slough and Subcutaneous after achieving pain control using Lidocaine 4% Topical Solution. A time out was conducted at 08:37, prior to the start of the procedure. A Minimum amount of bleeding was controlled with Pressure. The procedure was tolerated well with a pain level of 0  throughout and a pain level of 0 following the procedure. Post Debridement Measurements: 2cm length x 0.5cm width x 0.2cm depth; 0.157cm^3 volume. Character of Wound/Ulcer Post Debridement is stable. Severity of Tissue Post Debridement is: Fat layer exposed. Post procedure Diagnosis Wound #5: Same as Pre-Procedure Plan Wound Cleansing: Wound #5 Right,Lateral Malleolus: Clean wound with Normal Saline. May shower with protection. No tub bath. Anesthetic: [name], Darry R. ([id number]) Wound #5 Right,Lateral Malleolus: Topical Lidocaine 4% cream applied to wound bed prior to debridement Primary Wound Dressing: Wound #5 Right,Lateral Malleolus: Prisma Ag Secondary Dressing: Wound #5 Right,Lateral Malleolus: ABD pad Dressing Change Frequency: Wound #5 Right,Lateral Malleolus: Change dressing every week Follow-up Appointments: Wound #5 Right,Lateral Malleolus: Return Appointment in 1 week. Edema Control: Wound #5 Right,Lateral Malleolus: 3 Layer Compression System - Right Lower Extremity Additional Orders / Instructions: Wound #5 Right,Lateral Malleolus: Increase protein intake. Activity as tolerated Other: - MVI, Vit A, C, Zinc #1 at this point I see no reason to change the collagen based dressings #2 continue 3 layer compression #3 good chance of being healed next week Electronic Signature(s) Signed: 12/25/2015 6:11:07 PM By: [name] Najjarobson, Colie Josten MD Entered By: [name] Najjarobson, Laisa Larrick on 12/25/2015 08:49:48 Qu[place]one, [name] ([id number]) -------------------------------------------------------------------------------- SuperBill Details Patient Name: [name], Ryott R. Date of Service: 12/25/2015 Medical Record Number: [id number] Patient Account Number: [id number] Date of Birth/Sex: 03/12/1945 13(70 y.o. Male) Treating [name]: Afful, [name], BSN, Rita Primary Care Physician: [name] NicksMACDONALD, KERI Other Clinician: Referring Physician: Etheleen NicksMACDONALD, KERI Treating Physician/Extender: [name] CarolinaOBSON, Treylen Gibbs G Weeks in  Treatment: 4 Diagnosis Coding ICD-10 Codes Code Description [phone] Laceration without foreign body, right lower leg, sequela I87.2 Venous insufficiency (chronic) (peripheral) L97.312 Non-pressure chronic ulcer of right ankle with fat layer exposed E66.01 Morbid (severe) obesity due to excess calories Facility Procedures CPT4 Code Description: [id number] 11042 - DEB SUBQ TISSUE 20 SQ CM/< ICD-10 Description Diagnosis L97.312 Non-pressure chronic ulcer of right ankle with fat S81.811S Laceration without foreign body, right lower leg, s Modifier: layer expose equela Quantity: 1 d Physician Procedures CPT4 Code Description: [id number] 11042 - WC PHYS SUBQ TISS 20 SQ CM ICD-10 Description Diagnosis L97.312 Non-pressure chronic ulcer of right ankle with fat S81.811S Laceration without foreign body, right lower leg, s Modifier: layer expose equela Quantity: 1 d Electronic Signature(s) Signed: 12/25/2015 6:11:07 PM By: [name] Najjarobson, Josaphine Shimamoto MD Entered By: [name] Najjarobson, Harika Laidlaw on 12/25/2015 08:50:10

## 2016-01-01 ENCOUNTER — Encounter: Payer: Medicare Other | Attending: Internal Medicine | Admitting: Internal Medicine

## 2016-01-01 DIAGNOSIS — X58XXXS Exposure to other specified factors, sequela: Secondary | ICD-10-CM | POA: Diagnosis not present

## 2016-01-01 DIAGNOSIS — G4733 Obstructive sleep apnea (adult) (pediatric): Secondary | ICD-10-CM | POA: Diagnosis not present

## 2016-01-01 DIAGNOSIS — S81811S Laceration without foreign body, right lower leg, sequela: Secondary | ICD-10-CM | POA: Diagnosis not present

## 2016-01-01 DIAGNOSIS — Z6841 Body Mass Index (BMI) 40.0 and over, adult: Secondary | ICD-10-CM | POA: Insufficient documentation

## 2016-01-01 DIAGNOSIS — L97312 Non-pressure chronic ulcer of right ankle with fat layer exposed: Secondary | ICD-10-CM | POA: Diagnosis not present

## 2016-01-01 DIAGNOSIS — I10 Essential (primary) hypertension: Secondary | ICD-10-CM | POA: Insufficient documentation

## 2016-01-01 DIAGNOSIS — I872 Venous insufficiency (chronic) (peripheral): Secondary | ICD-10-CM | POA: Insufficient documentation

## 2016-01-02 NOTE — Progress Notes (Signed)
[name], [name] ([id number]) Visit Report for 01/01/2016 Chief Complaint Document Details Patient Name: [name], [name] Date of Service: 01/01/2016 10:15 AM Medical Record Number: [id number] Patient Account Number: [id number] Date of Birth/Sex: 09/16/1945 [age] y.o. Male) Treating RN: Clover MealyAfful, RN, BSN, [place] Primary Care Physician: Etheleen NicksMACDONALD, KERI Other Clinician: Referring Physician: Etheleen NicksMACDONALD, KERI Treating Physician/Extender: Altamese CarolinaOBSON, Kaelynn Igo G Weeks in Treatment: 5 Information Obtained from: Patient Chief Complaint presents for follow-up of right lower extremity venous ulceration Electronic Signature(s) Signed: 01/01/2016 5:08:56 PM By: Baltazar Najjarobson, Evon Lopezperez MD Entered By: Baltazar Najjarobson, Dinita Migliaccio on 01/01/2016 11:01:52 [place], [name] ([id number]) -------------------------------------------------------------------------------- Debridement Details Patient Name: [name] PenSHAFAR, [name] Date of Service: 01/01/2016 10:15 AM Medical Record Number: [id number] Patient Account Number: [id number] Date of Birth/Sex: 03/21/1945 [age] y.o. Male) Treating RN: Afful, RN, BSN, Rita Primary Care Physician: Etheleen NicksMACDONALD, KERI Other Clinician: Referring Physician: Etheleen NicksMACDONALD, KERI Treating Physician/Extender: Altamese CarolinaOBSON, Gagan Dillion G Weeks in Treatment: 5 Debridement Performed for Wound #5 Right,Lateral Malleolus Assessment: Performed By: Physician Maxwell CaulOBSON, Kordell Jafri G, MD Debridement: Debridement Pre-procedure Yes - 10:46 Verification/Time Out Taken: Start Time: 10:46 Pain Control: Lidocaine 4% Topical Solution Level: Skin/Subcutaneous Tissue Total Area Debrided (L x 1 (cm) x 0.3 (cm) = 0.3 (cm) W): Tissue and other Non-Viable, Fibrin/Slough, Subcutaneous material debrided: Instrument: Curette Bleeding: Minimum Hemostasis Achieved: Pressure End Time: 10:48 Procedural Pain: 0 Post Procedural Pain: 0 Response to Treatment: Procedure was tolerated well Post Debridement Measurements of Total Wound Length: (cm) 1 Width: (cm)  0.3 Depth: (cm) 0.1 Volume: (cm) 0.024 Character of Wound/Ulcer Post Stable Debridement: Severity of Tissue Post Debridement: Fat layer exposed Post Procedure Diagnosis Same as Pre-procedure Electronic Signature(s) Signed: 01/01/2016 4:42:11 PM By: Elpidio EricAfful, Rita BSN, RN Signed: 01/01/2016 5:08:56 PM By: Baltazar Najjarobson, Tyrin Herbers MD Entered By: Baltazar Najjarobson, Zahniya Zellars on 01/01/2016 10:56:35 Oconnor, [name] [name] KillianSHAFAR, [name] ([id number]) -------------------------------------------------------------------------------- HPI Details Patient Name: [name], [name] Date of Service: 01/01/2016 10:15 AM Medical Record Number: [id number] Patient Account Number: [id number] Date of Birth/Sex: 08/26/1945 [age] y.o. Male) Treating RN: Clover MealyAfful, RN, BSN, Rita Primary Care Physician: Etheleen NicksMACDONALD, KERI Other Clinician: Referring Physician: Etheleen NicksMACDONALD, KERI Treating Physician/Extender: Altamese CarolinaOBSON, Bekki Tavenner G Weeks in Treatment: 5 History of Present Illness Location: right lower extremity Quality: Patient has been experiencing much less pain currently Severity: 1 out of 10 Duration: Patient has had the wound for approximately 3 weeks prior to presenting for treatment Timing: Pain occurs most often with manipulation of the wound Context: Patient dropped a cardboard box which struck his leg causing the initial injury which hasn't healed since onset Modifying Factors: Currently patient has been using over-the-counter antibiotic ointment Associated Signs and Symptoms: Patient reports having difficulty standing for long periods. HPI Description: As noted above the patient had a injury due to working in the yard and this rapidly progressed to a ulcerated area with an infection. In October 2014 he did have some vascular studies done but never saw a Careers advisersurgeon and never had any surgery for varicose veins. His past medical history is significant for hypertension, obstructive sleep apnea, morbid obesity, stasis dermatitis of both  lower extremities and his past surgical history is suggestive of hip surgery on the right, gastric bypass surgery, cholecystectomy, hip replacement. He has recently been put on Levaquin 500 milligrams daily, Bactrim DS 1 twice a day and Bactroban 2% topical ointment locally. No recent labs or vascular or imaging studies done. 07/11/2014 a venous duplex study was done on 07/05/2014 -- it showed no evidence of deep vein thrombosis or superficial thrombosis bilaterally. There was incompetence of the  greater saphenous veins bilaterally and incompetence of the right small saphenous vein. The patient has an appointment to see the vascular surgeons on June 20. 07/18/2014 his appointment with the vascular surgeons is rescheduled for this afternoon. 07/25/2014 -- he saw the vascular surgeon and is going to have a endovenous procedure done on July 29. 08/01/2014 -- he was diagnosed with a UTI and is on ciprofloxacin for 10 days. readmission: 11/27/15 On evaluation today patient is having discomfort in the right ankle region following an open wound from having dropped a cardboard box striking his ankle. He tells me that this has not healed despite many of the measures that he learned from previous injuries when he was seen at our wound center. Therefore he didn't come in for reevaluation today. He has not noted any significant purulent discharge at this point in time. 12/04/15 patient appears to be doing somewhat better at this point in time today in regard to his ankle wound. it is measuring smaller and looking cleaner. 12/11/15; patient wound continues to look improve this is on the right lateral ankle. Debrided of surface slough and nonviable subcutaneous tissue. Using silver alginate TREVYN, LUMPKIN ([id number]) 12-18-15 Mr. Upchurch presents today with his wife for follow-up on right lower extremity venous ulcer. He denies any changes or complications over the past week. He is tolerating compression  therapy to his right lower extremity and has a properly fitting compression hose to the left lower extremity. 12/25/15; patient with a wound on his right lateral lower extremity in the setting of severe venous insufficiency [originally trauma against the box]. His edema is well controlled he is using Prisma. 01/01/16; continued improvement using Prisma. He has severe venous insufficiency however the wound was originally trauma against a box. Electronic Signature(s) Signed: 01/01/2016 5:08:56 PM By: Baltazar Najjar MD Entered By: Baltazar Najjar on 01/01/2016 11:03:25 [name] Alma ([id number]) -------------------------------------------------------------------------------- Physical Exam Details Patient Name: [name] Alma. Date of Service: 01/01/2016 10:15 AM Medical Record Number: [id number] Patient Account Number: [id number] Date of Birth/Sex: 1945/04/29 (70 y.o. Male) Treating RN: Clover Mealy, RN, BSN, [place] Sink Primary Care Physician: Etheleen Nicks Other Clinician: Referring Physician: Etheleen Nicks Treating Physician/Extender: Maxwell Caul Weeks in Treatment: 5 Constitutional Sitting or standing Blood Pressure is within target range for patient.. Pulse regular and within target range for patient.Marland Kitchen Respirations regular, non-labored and within target range.. Temperature is normal and within the target range for the patient.. Patient's appearance is neat and clean. Appears in no acute distress. Well nourished and well developed.. Cardiovascular Pedal pulses palpable and strong bilaterally.. Notes 01/01/16; significant lipodermatosclerosis. Nevertheless his wound is really nicely contracting. Debrided with a #3 curet removing surface eschar and nonviable subcutaneous tissue. Underneath only a small wound remains this looks very healthy and hopefully will be closed by next week Electronic Signature(s) Signed: 01/01/2016 5:08:56 PM By: Baltazar Najjar MD Entered By: Baltazar Najjar on  01/01/2016 11:04:53 Ludke, Jorje Guild ([id number]) -------------------------------------------------------------------------------- Physician Orders Details Patient Name: [name] Alma. Date of Service: 01/01/2016 10:15 AM Medical Record Number: [id number] Patient Account Number: [id number] Date of Birth/Sex: [birth year] (70 y.o. Male) Treating RN: Clover Mealy, RN, BSN, [place] Sink Primary Care Physician: Etheleen Nicks Other Clinician: Referring Physician: Etheleen Nicks Treating Physician/Extender: Altamese [place] in Treatment: 5 Verbal / Phone Orders: Yes Clinician: Afful, RN, BSN, Rita Read Back and Verified: Yes Diagnosis Coding Wound Cleansing Wound #5 Right,Lateral Malleolus o Clean wound with Normal Saline. o May shower with protection. o No tub bath. Anesthetic Wound #5  Right,Lateral Malleolus o Topical Lidocaine 4% cream applied to wound bed prior to debridement Primary Wound Dressing Wound #5 Right,Lateral Malleolus o Prisma Ag Secondary Dressing Wound #5 Right,Lateral Malleolus o ABD pad Dressing Change Frequency Wound #5 Right,Lateral Malleolus o Change dressing every week Follow-up Appointments Wound #5 Right,Lateral Malleolus o Return Appointment in 1 week. Edema Control Wound #5 Right,Lateral Malleolus o 3 Layer Compression System - Right Lower Extremity Additional Orders / Instructions Wound #5 Right,Lateral Malleolus o Increase protein intake. o Activity as tolerated o Other: - MVI, Vit A, C, Zinc HEZAKIAH, CHAMPEAU ([id number]) Electronic Signature(s) Signed: 01/01/2016 4:42:11 PM By: Elpidio Eric BSN, RN Signed: 01/01/2016 5:08:56 PM By: Baltazar Najjar MD Entered By: Elpidio Eric on 01/01/2016 10:49:41 Clauss, Jorje Guild ([id number]) -------------------------------------------------------------------------------- Problem List Details Patient Name: DOVID, BARTKO. Date of Service: 01/01/2016 10:15 AM Medical Record Number:  [id number] Patient Account Number: [id number] Date of Birth/Sex: [birth year] (70 y.o. 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 Date of Service: 01/01/2016 10:15 AM Medical Record Number: [id number] Patient Account Number: [id number] Date of Birth/Sex: 12-30-1945 (70 y.o. Male) Treating RN: Clover Mealy, RN, BSN, Rita Primary Care Physician: Etheleen Nicks Other Clinician: Referring Physician: Etheleen Nicks Treating Physician/Extender: Altamese [place] in Treatment: 5 Subjective Chief Complaint Information obtained from Patient presents for follow-up of right lower extremity venous ulceration History of Present Illness (HPI) The following HPI elements were documented for the patient's wound: Location: right lower extremity Quality: Patient has been experiencing much less pain currently Severity: 1 out of 10 Duration: Patient has had the wound for approximately 3 weeks prior to presenting for treatment Timing: Pain occurs most often with manipulation of the wound Context: Patient dropped a cardboard box which struck his leg causing  the initial injury which hasn't healed since onset Modifying Factors: Currently patient has been using over-the-counter antibiotic ointment Associated Signs and Symptoms: Patient reports having difficulty standing for long periods. As noted above the patient had a injury due to working in the yard and this rapidly progressed to a ulcerated area with an infection. In October 2014 he did have some vascular studies done but never saw a Careers adviser and never had any surgery for varicose veins. His past medical history is significant for hypertension, obstructive sleep apnea, morbid obesity, stasis dermatitis of both lower extremities and his past surgical history is suggestive of hip surgery on the right, gastric bypass surgery, cholecystectomy, hip replacement. He has recently been put on Levaquin 500 milligrams daily, Bactrim DS 1 twice a day and Bactroban 2% topical ointment locally. No recent labs or vascular or imaging studies done. 07/11/2014 a venous duplex study was done on 07/05/2014 -- it showed no evidence of deep vein thrombosis or superficial thrombosis bilaterally. 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 The patient has an appointment to see the vascular surgeons  on June 20. 07/18/2014 his appointment with the vascular surgeons is rescheduled for this afternoon. 07/25/2014 -- he saw the vascular surgeon and is going to have a endovenous procedure done on July 29. 08/01/2014 -- he was diagnosed with a UTI and is on ciprofloxacin for 10 days. readmission: 11/27/15 On evaluation today patient is having discomfort in the right ankle region following an open wound Traister, [place] R. ([id number]) from having dropped a cardboard box striking his ankle. He tells me that this has not healed despite many of the measures that he learned from previous injuries when he was seen at our wound center. Therefore he didn't come  in for reevaluation today. He has not noted any significant purulent discharge at this point in time. 12/04/15 patient appears to be doing somewhat better at this point in time today in regard to his ankle wound. it is measuring smaller and looking cleaner. 12/11/15; patient wound continues to look improve this is on the right lateral ankle. Debrided of surface slough and nonviable subcutaneous tissue. Using silver alginate 12-18-15 Mr. Balaban presents today with his wife for follow-up on right lower extremity venous ulcer. He denies any changes or complications over the past week. He is tolerating compression therapy to his right lower extremity and has a properly fitting compression hose to the left lower extremity. 12/25/15; patient with a wound on his right lateral lower extremity in the setting of severe venous insufficiency [originally trauma against the box]. His edema is well controlled he is using Prisma. 01/01/16; continued improvement using Prisma. He has severe venous insufficiency however the wound was originally trauma against a box. Objective Constitutional Sitting or standing Blood Pressure is within target range for patient.. Pulse regular and within target range for patient.Marland Kitchen Respirations regular, non-labored and within target range.. Temperature is normal and within the target range for the patient.. Patient's appearance is neat and clean. Appears in no acute distress. Well nourished and well developed.. Vitals Time Taken: 10:28 AM, Height: 70 in, Weight: 330 lbs, BMI: 47.3, Temperature: 98.3 F, Pulse: 75 bpm, Respiratory Rate: 18 breaths/min, Blood Pressure: 134/81 mmHg. Cardiovascular Pedal pulses palpable and strong bilaterally.. General Notes: 01/01/16; significant lipodermatosclerosis. Nevertheless his wound is really nicely contracting. Debrided with a #3 curet removing surface eschar and nonviable subcutaneous tissue. Underneath only a small wound remains this looks  very healthy and hopefully will be closed by next week Integumentary (Hair, Skin) Wound #5 status is Open. Original cause of wound was Gradually Appeared. The wound is located on the Right,Lateral Malleolus. The wound measures 0.5cm length x 0.3cm width x 0.1cm depth; 0.118cm^2 area and 0.012cm^3 volume. The wound is limited to skin breakdown. There is no tunneling or undermining noted. There is a medium amount of serosanguineous drainage noted. The wound margin is distinct with the outline attached to the wound base. There is large (67-100%) pink granulation within the wound bed. There is a small (1-33%) amount of necrotic tissue within the wound bed including Adherent Slough. The periwound skin appearance exhibited: Localized Edema, Moist, Hemosiderin Staining, Mottled. [facility], Meng R. ([id number]) periwound skin appearance did not exhibit: Callus, Crepitus, Excoriation, Fluctuance, Friable, Induration, Rash, Scarring, Dry/Scaly, Maceration, Atrophie Blanche, Cyanosis, Ecchymosis, Pallor, Rubor, Erythema. Periwound temperature was noted as No Abnormality. The periwound has tenderness on palpation. Assessment Active Problems ICD-10 S81.811S - Laceration without foreign body, right lower leg, sequela I87.2 - Venous insufficiency (chronic) (peripheral) L97.312 - Non-pressure chronic ulcer of right ankle with fat layer exposed E66.01 - Morbid (  severe) obesity due to excess calories Procedures Wound #5 Wound #5 is a Venous Leg Ulcer located on the Right,Lateral Malleolus . There was a Skin/Subcutaneous Tissue Debridement ([id number]) debridement with total area of 0.3 sq cm performed by Maxwell CaulOBSON, Mariamawit Depaoli G, MD. with the following instrument(s): Curette to remove Non-Viable tissue/material including Fibrin/Slough and Subcutaneous after achieving pain control using Lidocaine 4% Topical Solution. A time out was conducted at 10:46, prior to the start of the procedure. A Minimum amount of  bleeding was controlled with Pressure. The procedure was tolerated well with a pain level of 0 throughout and a pain level of 0 following the procedure. Post Debridement Measurements: 1cm length x 0.3cm width x 0.1cm depth; 0.024cm^3 volume. Character of Wound/Ulcer Post Debridement is stable. Severity of Tissue Post Debridement is: Fat layer exposed. Post procedure Diagnosis Wound #5: Same as Pre-Procedure Plan Wound Cleansing: Wound #5 Right,Lateral Malleolus: Clean wound with Normal Saline. May shower with protection. No tub bath. [name], Rollo R. ([id number]) Anesthetic: Wound #5 Right,Lateral Malleolus: Topical Lidocaine 4% cream applied to wound bed prior to debridement Primary Wound Dressing: Wound #5 Right,Lateral Malleolus: Prisma Ag Secondary Dressing: Wound #5 Right,Lateral Malleolus: ABD pad Dressing Change Frequency: Wound #5 Right,Lateral Malleolus: Change dressing every week Follow-up Appointments: Wound #5 Right,Lateral Malleolus: Return Appointment in 1 week. Edema Control: Wound #5 Right,Lateral Malleolus: 3 Layer Compression System - Right Lower Extremity Additional Orders / Instructions: Wound #5 Right,Lateral Malleolus: Increase protein intake. Activity as tolerated Other: - MVI, Vit A, C, Zinc #1 I think we can continue the Prisma, ABD,3layer compression Electronic Signature(s) Signed: 01/01/2016 5:08:56 PM By: Baltazar Najjarobson, Shawntina Diffee MD Entered By: Baltazar Najjarobson, Cullen Vanallen on 01/01/2016 11:06:06 Vanwey, [name] ([id number]) -------------------------------------------------------------------------------- SuperBill Details Patient Name: [name], Kharee R. Date of Service: 01/01/2016 Medical Record Number: [id number] Patient Account Number: [id number] Date of Birth/Sex: 10/15/1945 [age] y.o. Male) Treating RN: Afful, RN, BSN, Rita Primary Care Physician: Etheleen NicksMACDONALD, KERI Other Clinician: Referring Physician: Etheleen NicksMACDONALD, KERI Treating Physician/Extender: Altamese CarolinaOBSON, Tanor Glaspy  G Weeks in Treatment: 5 Diagnosis Coding ICD-10 Codes Code Description [phone] Laceration without foreign body, right lower leg, sequela I87.2 Venous insufficiency (chronic) (peripheral) L97.312 Non-pressure chronic ulcer of right ankle with fat layer exposed E66.01 Morbid (severe) obesity due to excess calories Facility Procedures CPT4 Code Description: [id number] 11042 - DEB SUBQ TISSUE 20 SQ CM/< ICD-10 Description Diagnosis S81.811S Laceration without foreign body, right lower leg, s L97.312 Non-pressure chronic ulcer of right ankle with fat Modifier: equela layer expose Quantity: 1 d Physician Procedures CPT4 Code Description: [id number] 11042 - WC PHYS SUBQ TISS 20 SQ CM ICD-10 Description Diagnosis S81.811S Laceration without foreign body, right lower leg, s L97.312 Non-pressure chronic ulcer of right ankle with fat Modifier: equela layer expose Quantity: 1 d Electronic Signature(s) Signed: 01/01/2016 5:08:56 PM By: Baltazar Najjarobson, Phil Michels MD Entered By: Baltazar Najjarobson, Vallerie Hentz on 01/01/2016 11:06:55

## 2016-01-02 NOTE — Progress Notes (Signed)
CAMILLO, QUADROS ([id number]) Visit Report for 01/01/2016 Arrival Information Details Patient Name: [name]. Date of Service: 01/01/2016 10:15 AM Medical Record Number: [id number] Patient Account Number: [id number] Date of Birth/Sex: Aug 31, 1945 (70 y.o. Male) Treating RN: [name] Mealy, RN, BSN, [name] Primary Care Physician: [name] Other Clinician: Referring Physician: Etheleen [name] Treating Physician/Extender: [name] in Treatment: 5 Visit Information History Since Last Visit All ordered tests and consults were completed: No Patient Arrived: [name] Added or deleted any medications: No Arrival Time: 10:28 Any new allergies or adverse reactions: No Accompanied By: wife Had a fall or experienced change in No Transfer Assistance: None activities of daily living that may affect Patient Identification Verified: Yes risk of falls: Secondary Verification Process Completed: Yes Signs or symptoms of abuse/neglect since last No Patient Requires Transmission-Based No visito Precautions: Hospitalized since last visit: No Patient Has Alerts: No Has Dressing in Place as Prescribed: Yes Pain Present Now: No Electronic Signature(s) Signed: 01/01/2016 4:42:11 PM By: [name] BSN, RN Entered By: [name] on 01/01/2016 10:28:20 [name] ([id number]) -------------------------------------------------------------------------------- Encounter Discharge Information Details Patient Name: [name]. Date of Service: 01/01/2016 10:15 AM Medical Record Number: [id number] Patient Account Number: [id number] Date of Birth/Sex: Mar 04, 1945 (70 y.o. Male) Treating RN: [name] Mealy, RN, BSN, [place] [name] Primary Care Physician: [name] Other Clinician: Referring Physician: Etheleen [name] Treating Physician/Extender: [name] in Treatment: 5 Encounter Discharge Information Items Discharge Pain Level: 0 Discharge Condition: Stable Ambulatory Status:  Cane Discharge Destination: Home Transportation: Private Auto Accompanied By: wife Schedule Follow-up Appointment: No Medication Reconciliation completed No and provided to Patient/Care [name]: Provided on Clinical Summary of Care: 01/01/2016 Form Type Recipient Paper Patient GS Electronic Signature(s) Signed: 01/01/2016 11:03:48 AM By: [name] Entered By: [name] on 01/01/2016 11:03:48 [name], [name] ([id number]) -------------------------------------------------------------------------------- Lower Extremity Assessment Details Patient Name: [name]. Date of Service: 01/01/2016 10:15 AM Medical Record Number: [id number] Patient Account Number: [id number] Date of Birth/Sex: 17-Oct-1945 (70 y.o. Male) Treating RN: [name] Mealy, RN, BSN,  [name] Primary Care Physician: [name] Other Clinician: Referring Physician: Etheleen [name] Treating Physician/Extender: [name]  in Treatment: 5 Edema Assessment Assessed: [Left: No] [Right: No] E[Left: dema] [Right: :] Calf Left: Right: Point of Measurement: 35 cm From Medial Instep cm 42 cm Ankle Left: Right: Point of Measurement: 9 cm From Medial Instep cm 26 cm Vascular Assessment Claudication: Claudication Assessment [Right:None] Pulses: Posterior Tibial Dorsalis Pedis Palpable: [Right:Yes] Extremity colors, hair growth, and conditions: Extremity Color: [Right:Mottled] Hair Growth on Extremity: [Right:No] Temperature of Extremity: [Right:Warm] Capillary Refill: [Right:< 3 seconds] Toe Nail Assessment Left: Right: Thick: Yes Discolored: Yes Deformed: No Improper Length and Hygiene: No Electronic Signature(s) Signed: 01/01/2016 4:42:11 PM By: [name] BSN, RN Entered By: [name] on 01/01/2016 10:36:02 [name] ([id number]) [name] Killian, [name] ([id number]) -------------------------------------------------------------------------------- Multi Wound Chart Details Patient Name:  [name] Pen R. Date of Service: 01/01/2016 10:15 AM Medical Record Number: [id number] Patient Account Number: [id number] Date of Birth/Sex: 09/15/1945 (70 y.o. Male) Treating RN: [name] Mealy, RN, BSN,  [name] Primary Care Physician: [name] Other Clinician: Referring Physician: Etheleen [name] Treating Physician/Extender: Maxwell Caul Weeks in Treatment: 5 Vital Signs Height(in): 70 Pulse(bpm): 75 Weight(lbs): 330 Blood Pressure 134/81 (mmHg): Body Mass Index(BMI): 47 Temperature(F): 98.3 Respiratory Rate 18 (breaths/min): Photos: [5:No Photos] [N/A:N/A] Wound Location: [5:Right Malleolus - Lateral] [N/A:N/A] Wounding Event: [5:Gradually Appeared] [N/A:N/A] Primary Etiology: [5:Venous Leg Ulcer] [N/A:N/A] Comorbid History: [5:Anemia, Sleep Apnea, Hypertension] [N/A:N/A] Date Acquired: [5:11/06/2015] [N/A:N/A] Weeks  of Treatment: [5:5] [N/A:N/A] Wound Status: [5:Open] [N/A:N/A] Measurements L x W x D 1x0.3x0.1 [N/A:N/A] (cm) Area (cm) : [5:0.236] [N/A:N/A] Volume (cm) : [5:0.024] [N/A:N/A] % Reduction in Area: [5:94.60%] [N/A:N/A] % Reduction in Volume: 97.20% [N/A:N/A] Classification: [5:Full Thickness Without Exposed Support Structures] [N/A:N/A] Exudate Amount: [5:Medium] [N/A:N/A] Exudate Type: [5:Serosanguineous] [N/A:N/A] Exudate Color: [5:red, brown] [N/A:N/A] Wound Margin: [5:Distinct, outline attached] [N/A:N/A] Granulation Amount: [5:Large (67-100%)] [N/A:N/A] Granulation Quality: [5:Pink] [N/A:N/A] Necrotic Amount: [5:Small (1-33%)] [N/A:N/A] Exposed Structures: [5:Fascia: No Fat: No Tendon: No Muscle: No Joint: No] [N/A:N/A] Bone: No Limited to Skin Breakdown Epithelialization: Medium (34-66%) N/A N/A Periwound Skin Texture: Edema: Yes N/A N/A Excoriation: No Induration: No Callus: No Crepitus: No Fluctuance: No Friable: No Rash: No Scarring: No Periwound Skin Moist: Yes N/A N/A Moisture: Maceration: No Dry/Scaly: No Periwound Skin  Color: Hemosiderin Staining: Yes N/A N/A Mottled: Yes Atrophie Blanche: No Cyanosis: No Ecchymosis: No Erythema: No Pallor: No Rubor: No Temperature: No Abnormality N/A N/A Tenderness on Yes N/A N/A Palpation: Wound Preparation: Ulcer Cleansing: N/A N/A Rinsed/Irrigated with Saline, Other: surg scrub with water Topical Anesthetic Applied: Other: lidocaine 4% Treatment Notes Electronic Signature(s) Signed: 01/01/2016 4:42:11 PM By: [name] EricAfful, Rita BSN, RN Entered By: [name] EricAfful, Rita on 01/01/2016 10:47:30 [name], [name] ([id number]) -------------------------------------------------------------------------------- Multi-Disciplinary Care Plan Details Patient Name: [name], [name] Date of Service: 01/01/2016 10:15 AM Medical Record Number: [id number] Patient Account Number: [id number] Date of Birth/Sex: 05/06/1945 3(70 y.o. Male) Treating RN: [name] MealyAfful, RN, BSN, [place] Sinkita Primary Care Physician: [name] NicksMACDONALD, KERI Other Clinician: Referring Physician: Etheleen NicksMACDONALD, KERI Treating Physician/Extender: [name] CarolinaOBSON, MICHAEL G Weeks in Treatment: 5 Active Inactive Orientation to the Wound Care Program Nursing Diagnoses: Knowledge deficit related to the wound healing center program Goals: Patient/caregiver will verbalize understanding of the Wound Healing Center Program Date Initiated: 11/27/2015 Goal Status: Active Interventions: Provide education on orientation to the wound center Notes: Venous Leg Ulcer Nursing Diagnoses: Knowledge deficit related to disease process and management Potential for venous Insuffiency (use before diagnosis confirmed) Goals: Non-invasive venous studies are completed as ordered Date Initiated: 11/27/2015 Goal Status: Active Patient will maintain optimal edema control Date Initiated: 11/27/2015 Goal Status: Active Verify adequate tissue perfusion prior to therapeutic compression application Date Initiated: 11/27/2015 Goal Status: Active Interventions: Assess peripheral  edema status every visit. Compression as ordered Provide education on venous insufficiency Ladouceur, Arnoldo R. ([id number]) Treatment Activities: Therapeutic compression applied : 11/27/2015 Notes: Wound/Skin Impairment Nursing Diagnoses: Impaired tissue integrity Knowledge deficit related to ulceration/compromised skin integrity Goals: Patient will have a decrease in wound volume by X% from date: (specify in notes) Date Initiated: 11/27/2015 Goal Status: Active Ulcer/skin breakdown will have a volume reduction of 30% by week 4 Date Initiated: 11/27/2015 Goal Status: Active Ulcer/skin breakdown will have a volume reduction of 50% by week 8 Date Initiated: 11/27/2015 Goal Status: Active Ulcer/skin breakdown will have a volume reduction of 80% by week 12 Date Initiated: 11/27/2015 Goal Status: Active Ulcer/skin breakdown will heal within 14 weeks Date Initiated: 11/27/2015 Goal Status: Active Interventions: Assess patient/caregiver ability to obtain necessary supplies Assess patient/caregiver ability to perform ulcer/skin care regimen upon admission and as needed Assess ulceration(s) every visit Provide education on ulcer and skin care Treatment Activities: Skin care regimen initiated : 11/27/2015 Topical wound management initiated : 11/27/2015 Notes: Electronic Signature(s) Signed: 01/01/2016 4:42:11 PM By: [name] EricAfful, Rita BSN, RN Entered By: [name] EricAfful, Rita on 01/01/2016 10:47:20 Behnken, [name] ([id number]) [name] KillianSHAFAR, Terril R. ([id number]) -------------------------------------------------------------------------------- Pain Assessment Details Patient Name: [name], [name] Date of Service: 01/01/2016 10:15  AM Medical Record Number: [id number] Patient Account Number: [id number] Date of Birth/Sex: 05/07/1945 96(70 y.o. Male) Treating RN: [name] MealyAfful, RN, BSN, [place] Sinkita Primary Care Physician: [name] NicksMACDONALD, KERI Other Clinician: Referring Physician: Etheleen NicksMACDONALD, KERI Treating Physician/Extender: [name] CarolinaOBSON,  MICHAEL G Weeks in Treatment: 5 Active Problems Location of Pain Severity and Description of Pain Patient Has Paino No Site Locations With Dressing Change: No Pain Management and Medication Current Pain Management: Electronic Signature(s) Signed: 01/01/2016 4:42:11 PM By: [name] EricAfful, Rita BSN, RN Entered By: [name] EricAfful, Rita on 01/01/2016 10:28:27 [name], [name] ([id number]) -------------------------------------------------------------------------------- Patient/Caregiver Education Details Patient Name: [name], [name] Date of Service: 01/01/2016 10:15 AM Medical Record Number: [id number] Patient Account Number: [id number] Date of Birth/Gender: 02/27/1945 [age] y.o. Male) Treating RN: [name] MealyAfful, RN, BSN, [place] Sinkita Primary Care Physician: [name] NicksMACDONALD, KERI Other Clinician: Referring Physician: Etheleen NicksMACDONALD, KERI Treating Physician/Extender: [name] CarolinaOBSON, MICHAEL G Weeks in Treatment: 5 Education Assessment Education Provided To: Patient Education Topics Provided Venous: Methods: Explain/Verbal Responses: State content correctly Welcome To The Wound Care Center: Methods: Explain/Verbal Responses: State content correctly Wound/Skin Impairment: Methods: Explain/Verbal Responses: State content correctly Electronic Signature(s) Signed: 01/01/2016 4:42:11 PM By: [name] EricAfful, Rita BSN, RN Entered By: [name] EricAfful, Rita on 01/01/2016 10:51:04 Carvalho, [name] ([id number]) -------------------------------------------------------------------------------- Wound Assessment Details Patient Name: [name] PenSHAFAR, Winifred R. Date of Service: 01/01/2016 10:15 AM Medical Record Number: [id number] Patient Account Number: [id number] Date of Birth/Sex: 02/10/1945 [age] y.o. Male) Treating RN: [name] MealyAfful, RN, BSN, Rita Primary Care Physician: [name] NicksMACDONALD, KERI Other Clinician: Referring Physician: Etheleen NicksMACDONALD, KERI Treating Physician/Extender: Maxwell CaulOBSON, MICHAEL G Weeks in Treatment: 5 Wound Status Wound Number: 5 Primary Etiology: Venous Leg Ulcer Wound  Location: Right, Lateral Malleolus Wound Status: Open Wounding Event: Gradually Appeared Comorbid Anemia, Sleep Apnea, History: Hypertension Date Acquired: 11/06/2015 Weeks Of Treatment: 5 Clustered Wound: No Photos Photo Uploaded By: [name] EricAfful, Rita on 01/01/2016 16:38:37 Wound Measurements Length: (cm) 0.5 Width: (cm) 0.3 Depth: (cm) 0.1 Area: (cm) 0.118 Volume: (cm) 0.012 % Reduction in Area: 97.3% % Reduction in Volume: 98.6% Epithelialization: Medium (34-66%) Tunneling: No Undermining: No Wound Description Full Thickness Without Exposed Classification: Support Structures Wound Margin: Distinct, outline attached Exudate Medium Amount: Exudate Type: Serosanguineous Exudate Color: red, brown Foul Odor After Cleansing: No Wound Bed Granulation Amount: Large (67-100%) Exposed Structure Granulation Quality: Pink Fascia Exposed: No Necrotic Amount: Small (1-33%) Fat Layer Exposed: No Tetrault, Jaydrian R. ([id number]) Necrotic Quality: Adherent Slough Tendon Exposed: No Muscle Exposed: No Joint Exposed: No Bone Exposed: No Limited to Skin Breakdown Periwound Skin Texture Texture Color No Abnormalities Noted: No No Abnormalities Noted: No Callus: No Atrophie Blanche: No Crepitus: No Cyanosis: No Excoriation: No Ecchymosis: No Fluctuance: No Erythema: No Friable: No Hemosiderin Staining: Yes Induration: No Mottled: Yes Localized Edema: Yes Pallor: No Rash: No Rubor: No Scarring: No Temperature / Pain Moisture Temperature: No Abnormality No Abnormalities Noted: No Tenderness on Palpation: Yes Dry / Scaly: No Maceration: No Moist: Yes Wound Preparation Ulcer Cleansing: Rinsed/Irrigated with Saline, Other: surg scrub with water, Topical Anesthetic Applied: Other: lidocaine 4%, Treatment Notes Wound #5 (Right, Lateral Malleolus) 1. Cleansed with: Cleanse wound with antibacterial soap and water No shower or tub bath May Shower, gently pat wound dry  prior to applying new dressing. 2. Anesthetic Topical Lidocaine 4% cream to wound bed prior to debridement 3. Peri-wound Care: Barrier cream Moisturizing lotion 4. Dressing Applied: Prisma Ag 5. Secondary Dressing Applied ABD Pad 7. Secured with 3 Layer Compression System - Right Lower Extremity Electronic Signature(s) [name], Jujhar R. ([id number]) Signed: 01/01/2016 4:42:11 PM By: [name] EricAfful, Rita BSN, RN  Entered By: [name] on 01/01/2016 10:52:36 Withers, [name] ([id number]) -------------------------------------------------------------------------------- Vitals Details Patient Name: [name]. Date of Service: 01/01/2016 10:15 AM Medical Record Number: [id number] Patient Account Number: [id number] Date of Birth/Sex: Apr 15, 1945 (70 y.o. Male) Treating RN: [name] Mealy, RN, BSN, Rita Primary Care Physician: [name] Other Clinician: Referring Physician: Etheleen [name] Treating Physician/Extender: [name] Isabela in Treatment: 5 Vital Signs Time Taken: 10:28 Temperature (F): 98.3 Height (in): 70 Pulse (bpm): 75 Weight (lbs): 330 Respiratory Rate (breaths/min): 18 Body Mass Index (BMI): 47.3 Blood Pressure (mmHg): 134/81 Reference Range: 80 - 120 mg / dl Electronic Signature(s) Signed: 01/01/2016 4:42:11 PM By: [name] BSN, RN Entered By: [name] on 01/01/2016 10:31:50

## 2016-01-08 ENCOUNTER — Encounter: Payer: Medicare Other | Admitting: Internal Medicine

## 2016-01-08 DIAGNOSIS — S81811S Laceration without foreign body, right lower leg, sequela: Secondary | ICD-10-CM | POA: Diagnosis not present

## 2016-01-10 NOTE — Progress Notes (Signed)
[name], Dhanush R. ([id number]) Visit Report for 01/08/2016 Chief Complaint Document Details Patient Name: [name], [name] Date of Service: 01/08/2016 1:30 PM Medical Record Number: [id number] Patient Account Number: [id number] Date of Birth/Sex: 01/29/1945 [age] y.o. Male) Treating RN: Clover MealyAfful, RN, BSN, [place] Sinkita Primary Care Physician: Etheleen NicksMACDONALD, KERI Other Clinician: Referring Physician: Etheleen NicksMACDONALD, KERI Treating Physician/Extender: Altamese CarolinaOBSON, Rodricus Candelaria G Weeks in Treatment: 6 Information Obtained from: Patient Chief Complaint presents for follow-up of right lower extremity venous ulceration Electronic Signature(s) Signed: 01/09/2016 7:52:22 AM By: Baltazar Najjarobson, Matia Zelada MD Entered By: Baltazar Najjarobson, Quindon Denker on 01/09/2016 07:37:47 [name], [name] ([id number]) -------------------------------------------------------------------------------- HPI Details Patient Name: [name], [name] Date of Service: 01/08/2016 1:30 PM Medical Record Number: [id number] Patient Account Number: [id number] Date of Birth/Sex: 10/19/1945 [age] y.o. Male) Treating RN: Clover MealyAfful, RN, BSN, Rita Primary Care Physician: Etheleen NicksMACDONALD, KERI Other Clinician: Referring Physician: Etheleen NicksMACDONALD, KERI Treating Physician/Extender: Altamese CarolinaOBSON, Rozann Holts G Weeks in Treatment: 6 History of Present Illness Location: right lower extremity Quality: Patient has been experiencing much less pain currently Severity: 1 out of 10 Duration: Patient has had the wound for approximately 3 weeks prior to presenting for treatment Timing: Pain occurs most often with manipulation of the wound Context: Patient dropped a cardboard box which struck his leg causing the initial injury which hasn't healed since onset Modifying Factors: Currently patient has been using over-the-counter antibiotic ointment Associated Signs and Symptoms: Patient reports having difficulty standing for long periods. HPI Description: As noted above the patient had a injury due to working in the yard and this  rapidly progressed to a ulcerated area with an infection. In October 2014 he did have some vascular studies done but never saw a Careers advisersurgeon and never had any surgery for varicose veins. His past medical history is significant for hypertension, obstructive sleep apnea, morbid obesity, stasis dermatitis of both lower extremities and his past surgical history is suggestive of hip surgery on the right, gastric bypass surgery, cholecystectomy, hip replacement. He has recently been put on Levaquin 500 milligrams daily, Bactrim DS 1 twice a day and Bactroban 2% topical ointment locally. No recent labs or vascular or imaging studies done. 07/11/2014 a venous duplex study was done on 07/05/2014 -- it showed no evidence of deep vein thrombosis or superficial thrombosis bilaterally. 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 The patient has an appointment to see the vascular surgeons on June 20. 07/18/2014 his appointment with the vascular surgeons is rescheduled for this afternoon. 07/25/2014 -- he saw the vascular surgeon and is going to have a endovenous procedure done on July 29. 08/01/2014 -- he was diagnosed with a UTI and is on ciprofloxacin for 10 days. readmission: 11/27/15 On evaluation today patient is having discomfort in the right ankle region following an open wound from having dropped a cardboard box striking his ankle. He tells me that this has not healed despite many of the measures that he learned from previous injuries when he was seen at our wound center. Therefore he didn't come in for reevaluation today. He has not noted any significant purulent discharge at this point in time. 12/04/15 patient appears to be doing somewhat better at this point in time today in regard to his ankle wound. it is measuring smaller and looking cleaner. 12/11/15; patient wound continues to look improve this is on the right lateral  ankle. Debrided of surface slough and nonviable subcutaneous tissue. Using silver alginate [name], [name] ([id number]) 12-18-15 [name] presents today with his  wife for follow-up on right lower extremity venous ulcer. He denies any changes or complications over the past week. He is tolerating compression therapy to his right lower extremity and has a properly fitting compression hose to the left lower extremity. 12/25/15; patient with a wound on his right lateral lower extremity in the setting of severe venous insufficiency [originally trauma against the box]. His edema is well controlled he is using Prisma. 01/01/16; continued improvement using Prisma. He has severe venous insufficiency however the wound was originally trauma against a box. 01/08/16; his wound is totally epithelialized. We will give him measurements for graded pressure stockings which I think he is going to get at elastic therapy in [facility] Electronic Signature(s) Signed: 01/09/2016 7:52:22 AM By: Baltazar Najjar MD Entered By: Baltazar Najjar on 01/09/2016 07:38:41 [name], [name] ([id number]) -------------------------------------------------------------------------------- Physical Exam Details Patient Name: [name] Pen R. Date of Service: 01/08/2016 1:30 PM Medical Record Number: [id number] Patient Account Number: [id number] Date of Birth/Sex: [birth year] (70 y.o. Male) Treating RN: Clover Mealy, RN, BSN, [place] Sink Primary Care Physician: Etheleen Nicks Other Clinician: Referring Physician: Etheleen Nicks Treating Physician/Extender: Maxwell Caul Weeks in Treatment: 6 Constitutional Sitting or standing Blood Pressure is within target range for patient.. Pulse regular and within target range for patient.Marland Kitchen Respirations regular, non-labored and within target range.. Temperature is normal and within the target range for the patient.. Patient's appearance is neat and clean. Appears in no acute distress.  Well nourished and well developed.. Eyes No scleral icterus. Respiratory Respiratory effort is easy and symmetric bilaterally. Rate is normal at rest and on room air.. Cardiovascular Femoral arteries without bruits and pulses strong.. Pedal pulses are intact. Lymphatic Nonpalpable in the popliteal or inguinal area. Integumentary (Hair, Skin) Severe bilateral venous insufficiency. Psychiatric No evidence of depression, anxiety, or agitation. Calm, cooperative, and communicative. Appropriate interactions and affect.. Notes Wound exam; very severe venous insufficiency nevertheless the wound has totally epithelialized. No specific intervention today is required Electronic Signature(s) Signed: 01/09/2016 7:52:22 AM By: Baltazar Najjar MD Entered By: Baltazar Najjar on 01/09/2016 07:41:30 [name], [name] ([id number]) -------------------------------------------------------------------------------- Physician Orders Details Patient Name: [name] Alma. Date of Service: 01/08/2016 1:30 PM Medical Record Number: [id number] Patient Account Number: [id number] Date of Birth/Sex: 01-May-1945 (70 y.o. Male) Treating RN: Clover Mealy, RN, BSN, [place] Sink Primary Care Physician: Etheleen Nicks Other Clinician: Referring Physician: Etheleen Nicks Treating Physician/Extender: Altamese [place] in Treatment: 6 Verbal / Phone Orders: Yes Clinician: Afful, RN, BSN, Rita Read Back and Verified: Yes Diagnosis Coding Edema Control o Patient to wear own compression stockings Discharge From [facility] o Discharge from Wound Care Center - Treatment Completed Electronic Signature(s) Signed: 01/09/2016 7:52:22 AM By: Baltazar Najjar MD Signed: 01/09/2016 5:11:17 PM By: Elpidio Eric BSN, RN Entered By: Elpidio Eric on 01/08/2016 13:48:14 [name], [name] ([id number]) -------------------------------------------------------------------------------- Problem List Details Patient Name: DONATO, STUDLEY R. Date  of Service: 01/08/2016 1:30 PM Medical Record Number: [id number] Patient Account Number: [id number] Date of Birth/Sex: [birth year] (70 y.o. 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 Date of Service: 01/08/2016 1:30 PM Medical Record Number: [id number] Patient Account Number: [id number] Date of Birth/Sex: 29-Dec-1945 (70 y.o. Male) Treating RN: Clover Mealy, RN, BSN, Rita Primary Care Physician: Etheleen Nicks Other Clinician: Referring Physician: Etheleen Nicks Treating Physician/Extender: Altamese [place] in Treatment: 6 Subjective Chief Complaint Information obtained from Patient presents for follow-up of right lower extremity venous ulceration History of Present Illness (HPI) The following HPI elements were documented for the patient's wound: Location: right lower extremity Quality: Patient has been experiencing much less pain currently Severity: 1 out of 10 Duration: Patient has had the wound for approximately 3 weeks prior to presenting for treatment Timing: Pain occurs most often with manipulation of the wound Context:  Patient dropped a cardboard box which struck his leg causing the initial injury which hasn't healed since onset Modifying Factors: Currently patient has been using over-the-counter antibiotic ointment Associated Signs and Symptoms: Patient reports having difficulty standing for long periods. As noted above the patient had a injury due to working in the yard and this rapidly progressed to a ulcerated area with an infection. In October 2014 he did have some vascular studies done but never saw a Careers adviser and never had any surgery for varicose veins. His past medical history is significant for hypertension, obstructive sleep apnea, morbid obesity, stasis dermatitis of both lower extremities and his past surgical history is suggestive of hip surgery on the right, gastric bypass surgery, cholecystectomy, hip replacement. He has recently been put on Levaquin 500 milligrams daily, Bactrim DS 1 twice a day and Bactroban 2% topical ointment locally. No recent labs or vascular or imaging studies done. 07/11/2014 a venous duplex study was done on 07/05/2014 -- it showed no evidence of deep vein thrombosis or superficial thrombosis bilaterally. 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 The patient has an appointment to see the vascular surgeons on June 20. 07/18/2014 his appointment with the vascular surgeons is rescheduled for this afternoon. 07/25/2014 -- he saw the vascular surgeon and is going to have a endovenous procedure done on July 29. 08/01/2014 -- he was diagnosed with a UTI and is on ciprofloxacin for 10 days. readmission: 11/27/15 On evaluation today patient is having discomfort in the right ankle region following an open wound [name], [name] ([id number]) from having dropped a cardboard box striking his ankle. He tells me that this has not healed despite many of the measures that he learned from previous injuries when  he was seen at our wound center. Therefore he didn't come in for reevaluation today. He has not noted any significant purulent discharge at this point in time. 12/04/15 patient appears to be doing somewhat better at this point in time today in regard to his ankle wound. it is measuring smaller and looking cleaner. 12/11/15; patient wound continues to look improve this is on the right lateral ankle. Debrided of surface slough and nonviable subcutaneous tissue. Using silver alginate 12-18-15 [name] presents today with his wife for follow-up on right lower extremity venous ulcer. He denies any changes or complications over the past week. He is tolerating compression therapy to his right lower extremity and has a properly fitting compression hose to the left lower extremity. 12/25/15; patient with a wound on his right lateral lower extremity in the setting of severe venous insufficiency [  originally trauma against the box]. His edema is well controlled he is using Prisma. 01/01/16; continued improvement using Prisma. He has severe venous insufficiency however the wound was originally trauma against a box. 01/08/16; his wound is totally epithelialized. We will give him measurements for graded pressure stockings which I think he is going to get at elastic therapy in [facility] [place] Objective Constitutional Sitting or standing Blood Pressure is within target range for patient.. Pulse regular and within target range for patient.Marland Kitchen. Respirations regular, non-labored and within target range.. Temperature is normal and within the target range for the patient.. Patient's appearance is neat and clean. Appears in no acute distress. Well nourished and well developed.. Vitals Time Taken: 1:28 PM, Height: 70 in, Weight: 330 lbs, BMI: 47.3, Temperature: 97.7 F, Pulse: 75 bpm, Respiratory Rate: 18 breaths/min, Blood Pressure: 136/63 mmHg. Eyes No scleral icterus. Respiratory Respiratory effort is  easy and symmetric bilaterally. Rate is normal at rest and on room air.. Cardiovascular Femoral arteries without bruits and pulses strong.. Pedal pulses are intact. Lymphatic Nonpalpable in the popliteal or inguinal area. Psychiatric No evidence of depression, anxiety, or agitation. Calm, cooperative, and communicative. Appropriate [name], [name] ([id number]) interactions and affect.. General Notes: Wound exam; very severe venous insufficiency nevertheless the wound has totally epithelialized. No specific intervention today is required Integumentary (Hair, Skin) Severe bilateral venous insufficiency. Wound #5 status is Healed - Epithelialized. Original cause of wound was Gradually Appeared. The wound is located on the Right,Lateral Malleolus. The wound measures 0cm length x 0cm width x 0cm depth; 0cm^2 area and 0cm^3 volume. The wound is limited to skin breakdown. There is no tunneling or undermining noted. There is a none present amount of drainage noted. The wound margin is distinct with the outline attached to the wound base. There is no granulation within the wound bed. There is a small (1-33%) amount of necrotic tissue within the wound bed including Adherent Slough. The periwound skin appearance exhibited: Localized Edema, Moist, Hemosiderin Staining, Mottled. The periwound skin appearance did not exhibit: Callus, Crepitus, Excoriation, Fluctuance, Friable, Induration, Rash, Scarring, Dry/Scaly, Maceration, Atrophie Blanche, Cyanosis, Ecchymosis, Pallor, Rubor, Erythema. Periwound temperature was noted as No Abnormality. The periwound has tenderness on palpation. Assessment Active Problems ICD-10 S81.811S - Laceration without foreign body, right lower leg, sequela I87.2 - Venous insufficiency (chronic) (peripheral) L97.312 - Non-pressure chronic ulcer of right ankle with fat layer exposed E66.01 - Morbid (severe) obesity due to excess calories Plan Edema Control: Patient to wear  own compression stockings [facility] - Treatment Completed [name], Kaien R. ([id number]) #1 the patient was discharged today #2 20-30 mm below-knee stockings from elastic therapy and [place]. We gave him measurements to get this done #3 he has an over-the-counter stocking on the left leg which is probably a 15-20 mm stocking but nevertheless should suffice until he can get proper compression stockings as noted. Electronic Signature(s) Signed: 01/09/2016 7:52:22 AM By: Baltazar Najjarobson, Annamay Laymon MD Entered By: Baltazar Najjarobson, Senai Ramnath on 01/09/2016 07:42:28 [name], [name] ([id number]) -------------------------------------------------------------------------------- SuperBill Details Patient Name: [name], Kay R. Date of Service: 01/08/2016 Medical Record Number: [id number] Patient Account Number: [id number] Date of Birth/Sex: 07/11/1945 [age] y.o. Male) Treating RN: Clover MealyAfful, RN, BSN, [place] Sinkita Primary Care Physician: Etheleen NicksMACDONALD, KERI Other Clinician: Referring Physician: Etheleen NicksMACDONALD, KERI Treating Physician/Extender: Altamese CarolinaOBSON, Plummer Matich G Weeks in Treatment: 6 Diagnosis Coding ICD-10 Codes Code Description [id number] Laceration without foreign body, right lower leg, sequela I87.2 Venous insufficiency (chronic) (peripheral) L97.312 Non-pressure chronic ulcer of  right ankle with fat layer exposed E66.01 Morbid (severe) obesity due to excess calories Facility Procedures CPT4 Code: [id number] Description: [phone] - WOUND CARE VISIT-LEV 2 EST PT Modifier: Quantity: 1 Physician Procedures CPT4 Code Description: [id number] - WC PHYS LEVEL 3 - EST PT ICD-10 Description Diagnosis L97.312 Non-pressure chronic ulcer of right ankle with fat S81.811S Laceration without foreign body, right lower leg, s Modifier: layer expose equela Quantity: 1 d Electronic Signature(s) Signed: 01/09/2016 7:52:22 AM By: Baltazar Najjar MD Entered By: Baltazar Najjar on 01/09/2016 07:42:51

## 2016-01-10 NOTE — Progress Notes (Signed)
[name] ([id number]) Visit Report for 01/08/2016 Arrival Information Details Patient Name: [name], [name]. Date of Service: 01/08/2016 1:30 PM Medical Record Number: [id number] Patient Account Number: [id number] Date of Birth/Sex: Dec 07, 1945 (70 y.o. Male) Treating RN: Clover Mealy, RN, BSN, Brogan Sink Primary Care Physician: Etheleen Nicks Other Clinician: Referring Physician: Etheleen Nicks Treating Physician/Extender: Altamese [place] in Treatment: 6 Visit Information History Since Last Visit All ordered tests and consults were completed: No Patient Arrived: [name] Added or deleted any medications: No Arrival Time: 13:28 Any new allergies or adverse reactions: No Accompanied By: wife Had a fall or experienced change in No Transfer Assistance: None activities of daily living that may affect Patient Identification Verified: Yes risk of falls: Secondary Verification Process Completed: Yes Signs or symptoms of abuse/neglect since last No Patient Requires Transmission-Based No visito Precautions: Hospitalized since last visit: No Patient Has Alerts: No Has Dressing in Place as Prescribed: Yes Has Compression in Place as Prescribed: Yes Pain Present Now: No Electronic Signature(s) Signed: 01/09/2016 5:11:17 PM By: Elpidio Eric BSN, RN Entered By: Elpidio Eric on 01/08/2016 13:28:42 Ramaker, [name] ([id number]) -------------------------------------------------------------------------------- Clinic Level of Care Assessment Details Patient Name: [name]. Date of Service: 01/08/2016 1:30 PM Medical Record Number: [id number] Patient Account Number: [id number] Date of Birth/Sex: [birth year] (70 y.o. Male) Treating RN: Clover Mealy, RN, BSN, Rita Primary Care Physician: Etheleen Nicks Other Clinician: Referring Physician: Etheleen Nicks Treating Physician/Extender: Altamese [place] in Treatment: 6 Clinic Level of Care Assessment Items TOOL 4 Quantity Score []  - Use when  only an EandM is performed on FOLLOW-UP visit 0 ASSESSMENTS - Nursing Assessment / Reassessment X - Reassessment of Co-morbidities (includes updates in patient status) 1 10 X - Reassessment of Adherence to Treatment Plan 1 5 ASSESSMENTS - Wound and Skin Assessment / Reassessment X - Simple Wound Assessment / Reassessment - one wound 1 5 []  - Complex Wound Assessment / Reassessment - multiple wounds 0 []  - Dermatologic / Skin Assessment (not related to wound area) 0 ASSESSMENTS - Focused Assessment []  - Circumferential Edema Measurements - multi extremities 0 []  - Nutritional Assessment / Counseling / Intervention 0 X - Lower Extremity Assessment (monofilament, tuning fork, pulses) 1 5 []  - Peripheral Arterial Disease Assessment (using hand held doppler) 0 ASSESSMENTS - Ostomy and/or Continence Assessment and Care []  - Incontinence Assessment and Management 0 []  - Ostomy Care Assessment and Management (repouching, etc.) 0 PROCESS - Coordination of Care X - Simple Patient / Family Education for ongoing care 1 15 []  - Complex (extensive) Patient / Family Education for ongoing care 0 []  - Staff obtains Chiropractor, Records, Test Results / Process Orders 0 []  - Staff telephones HHA, Nursing Homes / Clarify orders / etc 0 []  - Routine Transfer to another Facility (non-emergent condition) 0 [name], [name] ([id number]) []  - Routine Hospital Admission (non-emergent condition) 0 []  - New Admissions / Manufacturing engineer / Ordering NPWT, Apligraf, etc. 0 []  - Emergency Hospital Admission (emergent condition) 0 []  - Simple Discharge Coordination 0 []  - Complex (extensive) Discharge Coordination 0 PROCESS - Special Needs []  - Pediatric / Minor Patient Management 0 []  - Isolation Patient Management 0 []  - Hearing / Language / Visual special needs 0 []  - Assessment of Community assistance (transportation, D/C planning, etc.) 0 []  - Additional assistance / Altered mentation 0 []  - Support  Surface(s) Assessment (bed, cushion, seat, etc.) 0 INTERVENTIONS - Wound Cleansing / Measurement X - Simple Wound Cleansing - one wound 1 5 []  -  Complex Wound Cleansing - multiple wounds 0 X - Wound Imaging (photographs - any number of wounds) 1 5 []  - Wound Tracing (instead of photographs) 0 X - Simple Wound Measurement - one wound 1 5 []  - Complex Wound Measurement - multiple wounds 0 INTERVENTIONS - Wound Dressings X - Small Wound Dressing one or multiple wounds 1 10 []  - Medium Wound Dressing one or multiple wounds 0 []  - Large Wound Dressing one or multiple wounds 0 []  - Application of Medications - topical 0 []  - Application of Medications - injection 0 INTERVENTIONS - Miscellaneous []  - External ear exam 0 [name], [name] ([id number]) []  - Specimen Collection (cultures, biopsies, blood, body fluids, etc.) 0 []  - Specimen(s) / Culture(s) sent or taken to Lab for analysis 0 []  - Patient Transfer (multiple staff / Michiel SitesHoyer Lift / Similar devices) 0 []  - Simple Staple / Suture removal (25 or less) 0 []  - Complex Staple / Suture removal (26 or more) 0 []  - Hypo / Hyperglycemic Management (close monitor of Blood Glucose) 0 []  - Ankle / Brachial Index (ABI) - do not check if billed separately 0 X - Vital Signs 1 5 Has the patient been seen at the hospital within the last three years: Yes Total Score: 70 Level Of Care: New/Established - Level 2 Electronic Signature(s) Signed: 01/09/2016 5:11:17 PM By: Elpidio EricAfful, Rita BSN, RN Entered By: Elpidio EricAfful, Rita on 01/08/2016 13:48:44 [name], [name] ([id number]) -------------------------------------------------------------------------------- Encounter Discharge Information Details Patient Name: [name] AlmaSHAFAR, [name] Date of Service: 01/08/2016 1:30 PM Medical Record Number: [id number] Patient Account Number: [id number] Date of Birth/Sex: 02/12/1945 [age] y.o. Male) Treating RN: Clover MealyAfful, RN, BSN, [place] Sinkita Primary Care Physician: Etheleen NicksMACDONALD, KERI Other  Clinician: Referring Physician: Etheleen NicksMACDONALD, KERI Treating Physician/Extender: Altamese CarolinaOBSON, MICHAEL G Weeks in Treatment: 6 Encounter Discharge Information Items Discharge Pain Level: 0 Discharge Condition: Stable Ambulatory Status: Cane Discharge Destination: Home Transportation: Private Auto Accompanied By: wife Schedule Follow-up Appointment: No Medication Reconciliation completed and provided to Patient/Care No Jonn Chaikin: Provided on Clinical Summary of Care: 01/08/2016 Form Type Recipient Paper Patient GS Electronic Signature(s) Signed: 01/08/2016 1:55:22 PM By: Gwenlyn PerkingMoore, Shelia Entered By: Gwenlyn PerkingMoore, Shelia on 01/08/2016 13:55:21 [name], [name] ([id number]) -------------------------------------------------------------------------------- Lower Extremity Assessment Details Patient Name: [name] AlmaSHAFAR, [name] Date of Service: 01/08/2016 1:30 PM Medical Record Number: [id number] Patient Account Number: [id number] Date of Birth/Sex: 09/08/1945 [age] y.o. Male) Treating RN: Clover MealyAfful, RN, BSN, [place] Sinkita Primary Care Physician: Etheleen NicksMACDONALD, KERI Other Clinician: Referring Physician: Etheleen NicksMACDONALD, KERI Treating Physician/Extender: Maxwell CaulOBSON, MICHAEL G Weeks in Treatment: 6 Edema Assessment Assessed: [Left: No] [Right: No] Edema: [Left: N] [Right: o] Calf Left: Right: Point of Measurement: 35 cm From Medial Instep cm 38.7 cm Ankle Left: Right: Point of Measurement: 9 cm From Medial Instep cm 25.6 cm Vascular Assessment Claudication: Claudication Assessment [Right:None] Pulses: Posterior Tibial Dorsalis Pedis Palpable: [Right:Yes] Extremity colors, hair growth, and conditions: Extremity Color: [Right:Mottled] Hair Growth on Extremity: [Right:Yes] Temperature of Extremity: [Right:Warm] Capillary Refill: [Right:< 3 seconds] Toe Nail Assessment Left: Right: Thick: Yes Discolored: Yes Deformed: No Improper Length and Hygiene: No Electronic Signature(s) Signed: 01/09/2016 5:11:17 PM By: Elpidio EricAfful, Rita  BSN, RN Entered By: Elpidio EricAfful, Rita on 01/08/2016 13:34:36 [place], [name] ([id number]) Wickwire, [place] R. ([id number]) -------------------------------------------------------------------------------- Multi Wound Chart Details Patient Name: [name] PenSHAFAR, [name] Date of Service: 01/08/2016 1:30 PM Medical Record Number: [id number] Patient Account Number: [id number] Date of Birth/Sex: 11/20/1945 [age] y.o. Male) Treating RN: Clover MealyAfful, RN, BSN, [place] Sinkita Primary Care Physician: Etheleen NicksMACDONALD, KERI Other Clinician: Referring Physician: Etheleen NicksMACDONALD, KERI Treating  Physician/Extender: Maxwell Caul Weeks in Treatment: 6 Vital Signs Height(in): 70 Pulse(bpm): 75 Weight(lbs): 330 Blood Pressure 136/63 (mmHg): Body Mass Index(BMI): 47 Temperature(F): 97.7 Respiratory Rate 18 (breaths/min): Photos: [5:No Photos] [N/A:N/A] Wound Location: [5:Right Malleolus - Lateral] [N/A:N/A] Wounding Event: [5:Gradually Appeared] [N/A:N/A] Primary Etiology: [5:Venous Leg Ulcer] [N/A:N/A] Comorbid History: [5:Anemia, Sleep Apnea, Hypertension] [N/A:N/A] Date Acquired: [5:11/06/2015] [N/A:N/A] Weeks of Treatment: [5:6] [N/A:N/A] Wound Status: [5:Healed - Epithelialized] [N/A:N/A] Measurements L x W x D 0x0x0 [N/A:N/A] (cm) Area (cm) : [5:0] [N/A:N/A] Volume (cm) : [5:0] [N/A:N/A] % Reduction in Area: [5:100.00%] [N/A:N/A] % Reduction in Volume: 100.00% [N/A:N/A] Classification: [5:Full Thickness Without Exposed Support Structures] [N/A:N/A] Exudate Amount: [5:None Present] [N/A:N/A] Wound Margin: [5:Distinct, outline attached] [N/A:N/A] Granulation Amount: [5:None Present (0%)] [N/A:N/A] Necrotic Amount: [5:Small (1-33%)] [N/A:N/A] Exposed Structures: [5:Fascia: No Fat: No Tendon: No Muscle: No Joint: No Bone: No Limited to Skin Breakdown] [N/A:N/A] Epithelialization: Large (67-100%) N/A N/A Periwound Skin Texture: Edema: Yes N/A N/A Excoriation: No Induration: No Callus: No Crepitus: No Fluctuance:  No Friable: No Rash: No Scarring: No Periwound Skin Moist: Yes N/A N/A Moisture: Maceration: No Dry/Scaly: No Periwound Skin Color: Hemosiderin Staining: Yes N/A N/A Mottled: Yes Atrophie Blanche: No Cyanosis: No Ecchymosis: No Erythema: No Pallor: No Rubor: No Temperature: No Abnormality N/A N/A Tenderness on Yes N/A N/A Palpation: Wound Preparation: Ulcer Cleansing: N/A N/A Rinsed/Irrigated with Saline, Other: surg scrub with water Topical Anesthetic Applied: Other: lidocaine 4% Treatment Notes Electronic Signature(s) Signed: 01/09/2016 5:11:17 PM By: Elpidio Eric BSN, RN Entered By: Elpidio Eric on 01/08/2016 13:47:05 Minervini, [name] ([id number]) -------------------------------------------------------------------------------- Multi-Disciplinary Care Plan Details Patient Name: [name]. Date of Service: 01/08/2016 1:30 PM Medical Record Number: [id number] Patient Account Number: [id number] Date of Birth/Sex: 1945/09/26 (70 y.o. Male) Treating RN: Clover Mealy, RN, BSN, [place] Sink Primary Care Physician: Etheleen Nicks Other Clinician: Referring Physician: Etheleen Nicks Treating Physician/Extender: Altamese [place] in Treatment: 6 Active Inactive Electronic Signature(s) Signed: 01/08/2016 5:03:47 PM By: Elpidio Eric BSN, RN Entered By: Elpidio Eric on 01/08/2016 17:03:46 Rufo, [name] ([id number]) -------------------------------------------------------------------------------- Pain Assessment Details Patient Name: [name]. Date of Service: 01/08/2016 1:30 PM Medical Record Number: [id number] Patient Account Number: [id number] Date of Birth/Sex: Jul 12, 1945 (70 y.o. Male) Treating RN: Clover Mealy, RN, BSN, [place] Sink Primary Care Physician: Etheleen Nicks Other Clinician: Referring Physician: Etheleen Nicks Treating Physician/Extender: Altamese [place] in Treatment: 6 Active Problems Location of Pain Severity and Description of Pain Patient Has Paino  No Site Locations With Dressing Change: No Pain Management and Medication Current Pain Management: Electronic Signature(s) Signed: 01/09/2016 5:11:17 PM By: Elpidio Eric BSN, RN Entered By: Elpidio Eric on 01/08/2016 13:28:49 Farney, [name] ([id number]) -------------------------------------------------------------------------------- Patient/Caregiver Education Details Patient Name: [name]. Date of Service: 01/08/2016 1:30 PM Medical Record Number: [id number] Patient Account Number: [id number] Date of Birth/Gender: 1945-04-16 (71 y.o. Male) Treating RN: Clover Mealy, RN, BSN,  Sink Primary Care Physician: Etheleen Nicks Other Clinician: Referring Physician: Etheleen Nicks Treating Physician/Extender: Altamese  in Treatment: 6 Education Assessment Education Provided To: Patient Education Topics Provided Venous: Methods: Explain/Verbal Responses: State content correctly [facility]: Methods: Explain/Verbal Responses: State content correctly Wound/Skin Impairment: Methods: Explain/Verbal Responses: State content correctly Electronic Signature(s) Signed: 01/09/2016 5:11:17 PM By: Elpidio Eric BSN, RN Entered By: Elpidio Eric on 01/08/2016 13:54:44 Jaimes, [name] ([id number]) -------------------------------------------------------------------------------- Wound Assessment Details Patient Name: [name] R. Date of Service: 01/08/2016 1:30 PM Medical Record Number: [id number] Patient Account Number: [id number] Date of Birth/Sex: 01/06/1946 (  70 y.o. Male) Treating RN: Afful, RN, BSN, Psychologist, clinicalita Primary Care Physician: Etheleen NicksMACDONALD, KERI Other Clinician: Referring Physician: Etheleen NicksMACDONALD, KERI Treating Physician/Extender: Altamese CarolinaOBSON, MICHAEL G Weeks in Treatment: 6 Wound Status Wound Number: 5 Primary Etiology: Venous Leg Ulcer Wound Location: Right Malleolus - Lateral Wound Status: Healed - Epithelialized Wounding Event: Gradually Appeared Comorbid  Anemia, Sleep Apnea, History: Hypertension Date Acquired: 11/06/2015 Weeks Of Treatment: 6 Clustered Wound: No Photos Photo Uploaded By: Elpidio EricAfful, Rita on 01/08/2016 16:19:05 Wound Measurements Length: (cm) 0 % Reduction Width: (cm) 0 % Reduction Depth: (cm) 0 Epitheliali Area: (cm) 0 Tunneling: Volume: (cm) 0 Underminin in Area: 100% in Volume: 100% zation: Large (67-100%) No g: No Wound Description Full Thickness Without Exposed Classification: Support Structures Wound Margin: Distinct, outline attached Exudate None Present Amount: Foul Odor After Cleansing: No Wound Bed Granulation Amount: None Present (0%) Exposed Structure Necrotic Amount: Small (1-33%) Fascia Exposed: No Necrotic Quality: Adherent Slough Fat Layer Exposed: No Tendon Exposed: No Muscle Exposed: No [name], [name] ([id number]) Joint Exposed: No Bone Exposed: No Limited to Skin Breakdown Periwound Skin Texture Texture Color No Abnormalities Noted: No No Abnormalities Noted: No Callus: No Atrophie Blanche: No Crepitus: No Cyanosis: No Excoriation: No Ecchymosis: No Fluctuance: No Erythema: No Friable: No Hemosiderin Staining: Yes Induration: No Mottled: Yes Localized Edema: Yes Pallor: No Rash: No Rubor: No Scarring: No Temperature / Pain Moisture Temperature: No Abnormality No Abnormalities Noted: No Tenderness on Palpation: Yes Dry / Scaly: No Maceration: No Moist: Yes Wound Preparation Ulcer Cleansing: Rinsed/Irrigated with Saline, Other: surg scrub with water, Topical Anesthetic Applied: Other: lidocaine 4%, Electronic Signature(s) Signed: 01/09/2016 5:11:17 PM By: Elpidio EricAfful, Rita BSN, RN Entered By: Elpidio EricAfful, Rita on 01/08/2016 13:39:44 Fix, [name] ([id number]) -------------------------------------------------------------------------------- Vitals Details Patient Name: [name] AlmaSHAFAR, [name] Date of Service: 01/08/2016 1:30 PM Medical Record Number: [id number] Patient  Account Number: [id number] Date of Birth/Sex: 06/07/1945 107(70 y.o. Male) Treating RN: Clover MealyAfful, RN, BSN, Rita Primary Care Physician: Etheleen NicksMACDONALD, KERI Other Clinician: Referring Physician: Etheleen NicksMACDONALD, KERI Treating Physician/Extender: Altamese CarolinaOBSON, MICHAEL G Weeks in Treatment: 6 Vital Signs Time Taken: 13:28 Temperature (F): 97.7 Height (in): 70 Pulse (bpm): 75 Weight (lbs): 330 Respiratory Rate (breaths/min): 18 Body Mass Index (BMI): 47.3 Blood Pressure (mmHg): 136/63 Reference Range: 80 - 120 mg / dl Electronic Signature(s) Signed: 01/09/2016 5:11:17 PM By: Elpidio EricAfful, Rita BSN, RN Entered By: Elpidio EricAfful, Rita on 01/08/2016 13:31:07

## 2016-02-29 IMAGING — CT CT ABD-PELV W/O CM
1 of 3 series · 13 of 32 positions shown, 18 images · non-contrast
Comparison: None.

CLINICAL DATA: 69-year-old male with abdominal pain. History of
gastric bypass surgery.

EXAM:
CT ABDOMEN AND PELVIS WITHOUT CONTRAST
TECHNIQUE: Multidetector CT imaging of the abdomen and pelvis was performed
following the standard protocol without IV contrast.

[Series 2: routine abd pel without · axial · non-contrast · 0.94mm/px · z∈[-460,-24]mm · 13 of 97 slices shown, 18 images]
[im 5/97  soft-tissue]
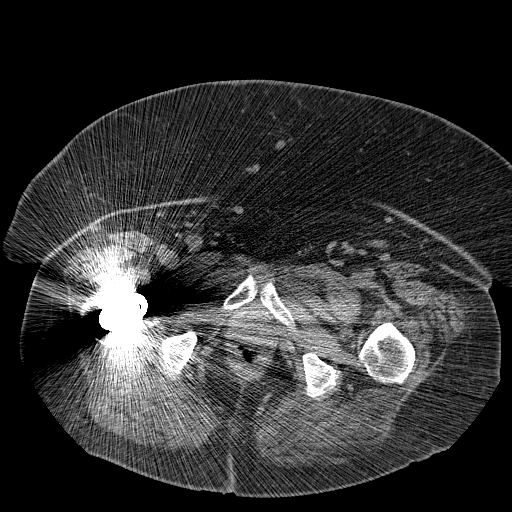
[im 5/97  bone]
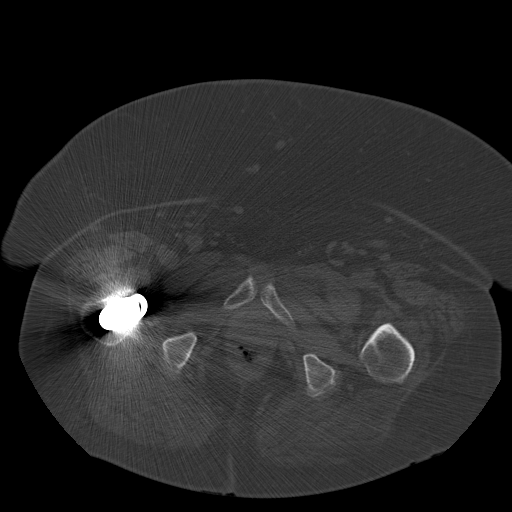
[im 15/97  soft-tissue]
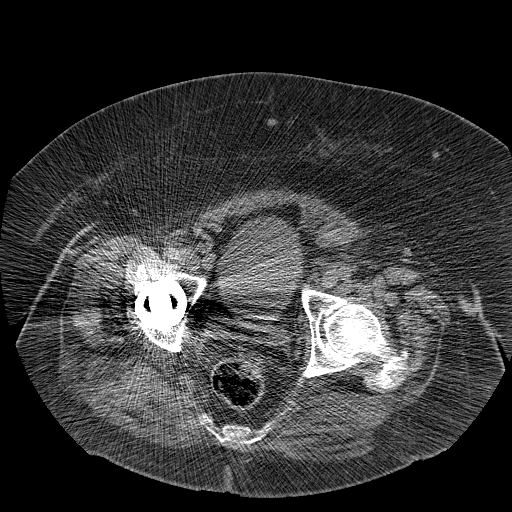
[im 20/97  soft-tissue]
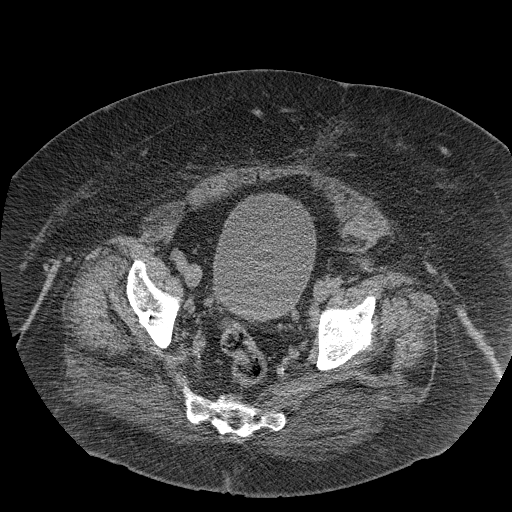
[im 29/97  soft-tissue]
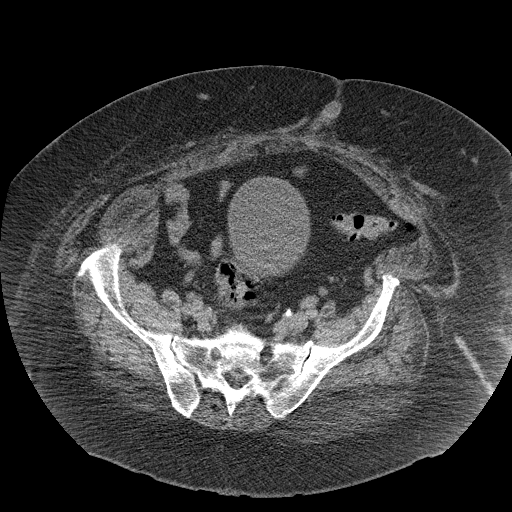
[im 39/97  soft-tissue]
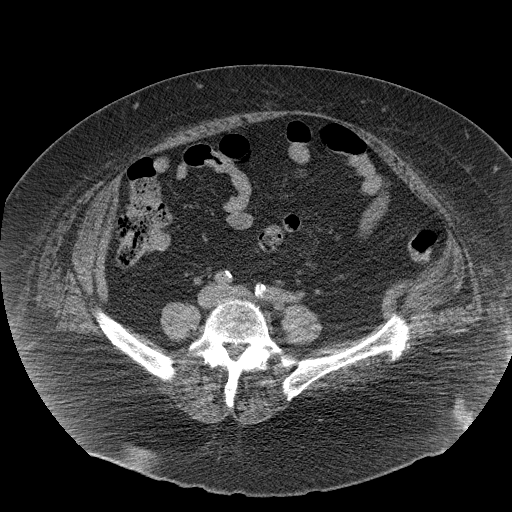
[im 44/97  soft-tissue]
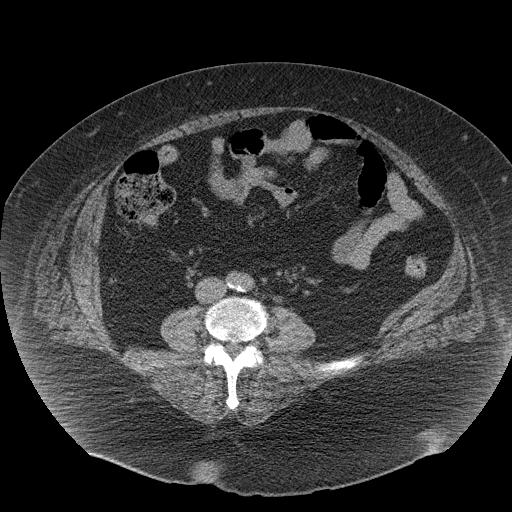
[im 53/97  soft-tissue]
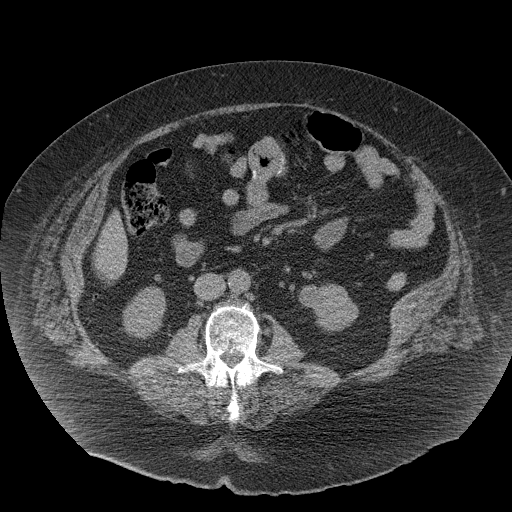
[im 58/97  soft-tissue]
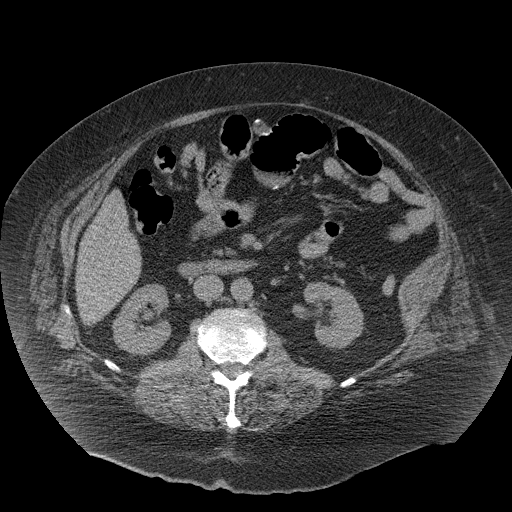
[im 68/97  soft-tissue]
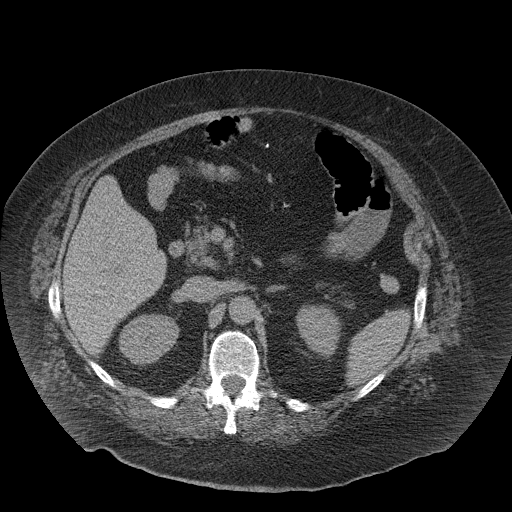
[im 68/97  bone]
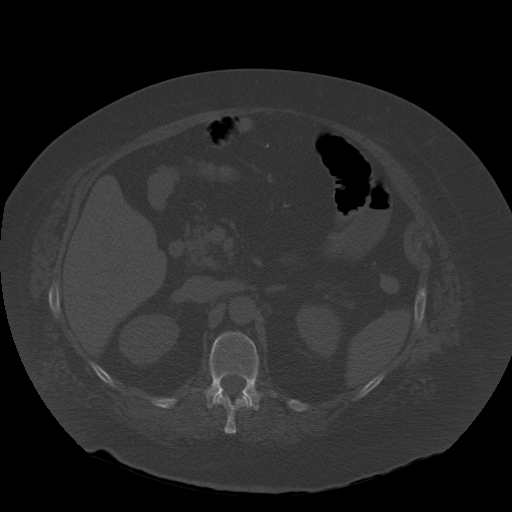
[im 77/97  soft-tissue]
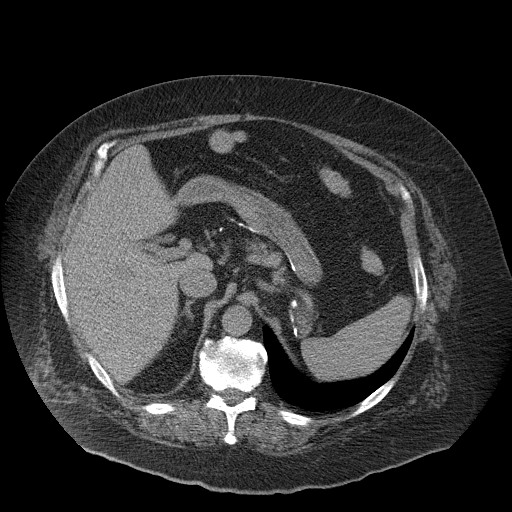
[im 77/97  lung]
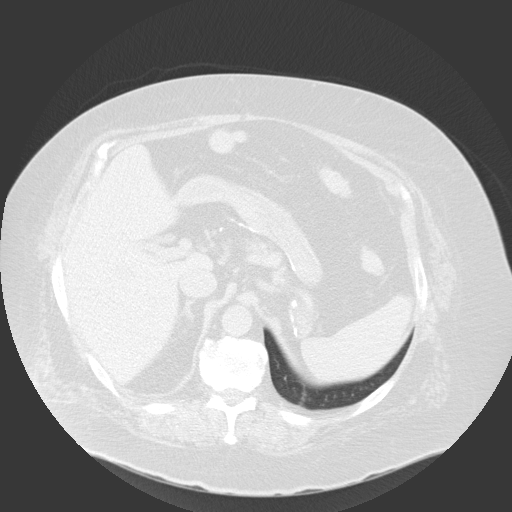
[im 82/97  soft-tissue]
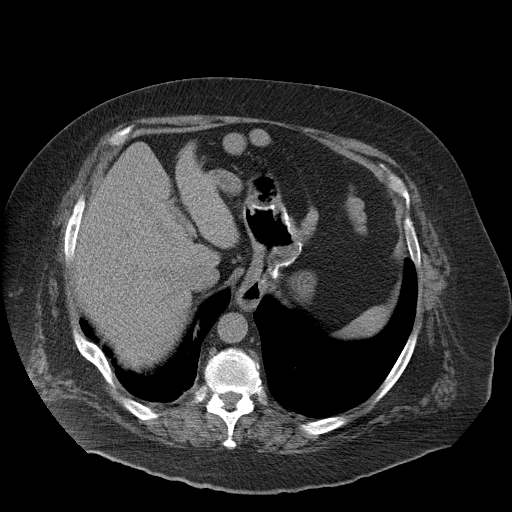
[im 82/97  lung]
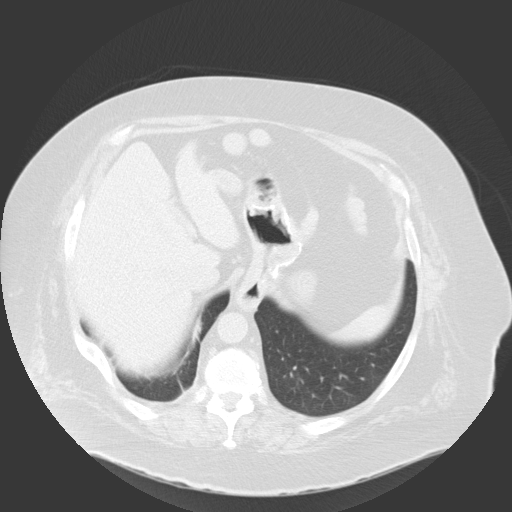
[im 87/97  lung]
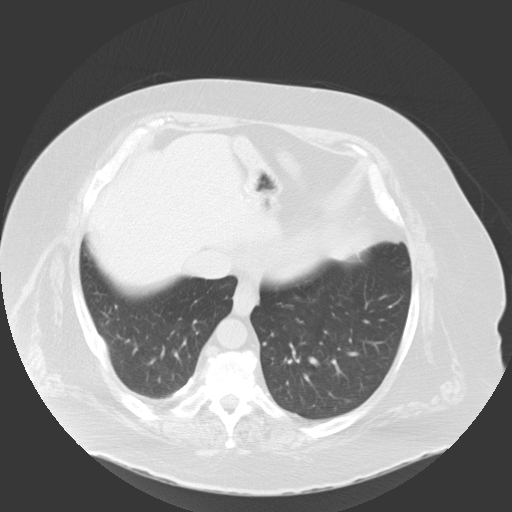
[im 92/97  soft-tissue]
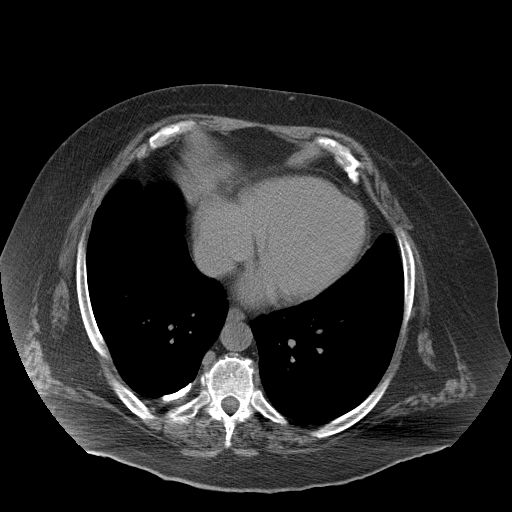
[im 92/97  lung]
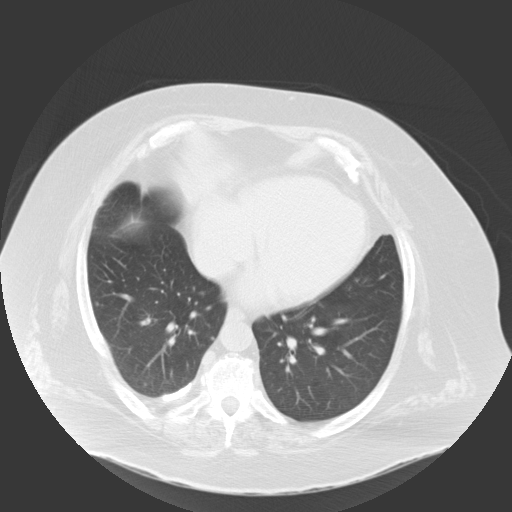

[13 of 32 positions shown; findings below may reference images not displayed]

FINDINGS: Evaluation of this exam is limited in the absence of intravenous
contrast. Evaluation of the pelvis is limited due to streak artifact
caused by right hip arthroplasty.

There are small scattered nodular densities at the right lung base
measuring up to 4 mm, likely post inflammatory/infectious.
Developing pneumonia is not excluded. Clinical correlation is
recommended. Focal right posterior pleural calcification, likely
sequela of prior asbestos exposure. There is coronary vascular
calcification. No intra-abdominal free air or free fluid identified.

Cholecystectomy. The liver, pancreas, spleen, adrenal glands appear
unremarkable. A 2 cm left renal inferior pole hypodense lesion is
not characterized on this noncontrast CT. Ultrasound is recommended
for further evaluation. There is no hydronephrosis or
nephrolithiasis on either side. The urinary bladder appears
unremarkable. The prostate gland is grossly unremarkable.

There postsurgical changes of gastric bypass surgery. Evaluation of
the bowel is limited in the absence of oral contrast. There is a
mildly dilated air-filled segment of small bowel in the left upper
abdomen anteriorly measuring 4.5 cm in diameter. There is abrupt
caliber change at the adjacent anastomotic suture which may
represent a degree of narrowing at the suture. Evaluation is however
limited in the absence of oral contrast. No other bowel dilatation
identified. No inflammatory changes noted. The appendix appears
unremarkable.

There is aortoiliac atherosclerotic disease. The abdominal aorta and
IVC appear grossly unremarkable on this noncontrast study. No portal
venous gas identified. There is no adenopathy.

The abdominal wall soft tissues appear grossly unremarkable. There
is multilevel degenerative changes of the spine. Old healed right
posterior rib fractures. There is a right hip arthroplasty. No acute
fracture.
IMPRESSION: No hydronephrosis or nephrolithiasis. Left renal inferior pole
hypodense lesion, incompletely characterized. Ultrasound is
recommended for further evaluation.

Postsurgical changes of the bowel and gastric bypass. A short
segment mildly dilated and air-filled loop of small bowel in the
left upper abdomen proximal to an anastomotic suture may represent a
degree of narrowing at the anastomosis. Clinical correlation is
recommended. Follow-up with CT with oral contrast or serial small
bowel follow-through may provide additional information.

## 2017-05-06 ENCOUNTER — Other Ambulatory Visit: Payer: Self-pay

## 2017-05-06 ENCOUNTER — Encounter: Payer: Self-pay | Admitting: Emergency Medicine

## 2017-05-06 ENCOUNTER — Emergency Department
Admission: EM | Admit: 2017-05-06 | Discharge: 2017-05-06 | Disposition: A | Discharge status: Home / Self Care | Payer: Medicare Other | Attending: Emergency Medicine | Admitting: Emergency Medicine

## 2017-05-06 DIAGNOSIS — Y69 Unspecified misadventure during surgical and medical care: Secondary | ICD-10-CM | POA: Insufficient documentation

## 2017-05-06 DIAGNOSIS — Z79899 Other long term (current) drug therapy: Secondary | ICD-10-CM | POA: Insufficient documentation

## 2017-05-06 DIAGNOSIS — Z96641 Presence of right artificial hip joint: Secondary | ICD-10-CM | POA: Diagnosis not present

## 2017-05-06 DIAGNOSIS — Z87891 Personal history of nicotine dependence: Secondary | ICD-10-CM | POA: Diagnosis not present

## 2017-05-06 DIAGNOSIS — T83090A Other mechanical complication of cystostomy catheter, initial encounter: Secondary | ICD-10-CM | POA: Insufficient documentation

## 2017-05-06 DIAGNOSIS — I10 Essential (primary) hypertension: Secondary | ICD-10-CM | POA: Insufficient documentation

## 2017-05-06 DIAGNOSIS — T83098A Other mechanical complication of other indwelling urethral catheter, initial encounter: Secondary | ICD-10-CM | POA: Diagnosis present

## 2017-05-06 DIAGNOSIS — R197 Diarrhea, unspecified: Secondary | ICD-10-CM | POA: Diagnosis not present

## 2017-05-06 LAB — URINALYSIS, COMPLETE (UACMP) WITH MICROSCOPIC
Bilirubin Urine: NEGATIVE
GLUCOSE, UA: NEGATIVE mg/dL
Ketones, ur: NEGATIVE mg/dL
Nitrite: POSITIVE — AB
PH: 6 (ref 5.0–8.0)
Protein, ur: 30 mg/dL — AB
SPECIFIC GRAVITY, URINE: 1.015 (ref 1.005–1.030)

## 2017-05-06 LAB — BASIC METABOLIC PANEL
Anion gap: 10 (ref 5–15)
BUN: 28 mg/dL — ABNORMAL HIGH (ref 6–20)
CALCIUM: 8.6 mg/dL — AB (ref 8.9–10.3)
CO2: 22 mmol/L (ref 22–32)
Chloride: 107 mmol/L (ref 101–111)
Creatinine, Ser: 1.15 mg/dL (ref 0.61–1.24)
GFR calc Af Amer: >60 mL/min (ref >60)
GFR calc non Af Amer: >60 mL/min (ref >60)
GLUCOSE: 113 mg/dL — AB (ref 65–99)
Potassium: 4 mmol/L (ref 3.5–5.1)
Sodium: 139 mmol/L (ref 135–145)

## 2017-05-06 LAB — CBC
HEMATOCRIT: 39.2 % — AB (ref 40.0–52.0)
Hemoglobin: 13.5 g/dL (ref 13.0–18.0)
MCH: 31.4 pg (ref 26.0–34.0)
MCHC: 34.5 g/dL (ref 32.0–36.0)
MCV: 90.9 fL (ref 80.0–100.0)
Platelets: 234 10*3/uL (ref 150–440)
RBC: 4.31 MIL/uL — ABNORMAL LOW (ref 4.40–5.90)
RDW: 12.4 % (ref 11.5–14.5)
WBC: 9.7 10*3/uL (ref 3.8–10.6)

## 2017-05-06 MED ORDER — ACETAMINOPHEN 325 MG PO TABS
650.0000 mg | ORAL_TABLET | Freq: Once | ORAL | Status: AC
  Administered 2017-05-06: 650 mg via ORAL
  Filled 2017-05-06: qty 2

## 2017-05-06 NOTE — ED Notes (Signed)
Urine noted to urine bag.

## 2017-05-06 NOTE — Discharge Instructions (Signed)
Follow closely with your primary care doctor for your diarrhea and your urologist for your urinary catheter.  We did put a 14-gauge catheter in which is slightly smaller than what you have had before but we had a little difficulty passing the 16-gauge.  There are any complications with catheter please return to the emergency room.  Follow closely with PCP

## 2017-05-06 NOTE — ED Provider Notes (Addendum)
[facility] Emergency Department Provider Note  ____________________________________________   I have reviewed the triage vital signs and the nursing notes. Where available I have reviewed prior notes and, if possible and indicated, outside hospital notes.    HISTORY  Chief Complaint Urinary Catheter Problem    HPI [name] is a 72 y.o. male  with a history of multiple medical problems who has a suprapubic catheter for the last 2 years.  He rolled over in bed and pulled out.  Did not hurt.  No other complaints.  He does have incidentally some mild diarrhea today.  That is not why is here.  Denies any abdominal pain.  He thinks that the catheters is taking loosely in there.  Is a 38 [place].      Past Medical History:  Diagnosis Date  . Acid reflux 01/05/2013  . BPH (benign prostatic hypertrophy) with urinary retention   . Cellulitis    foot  . Chronic bacterial prostatitis   . Essential (primary) hypertension 02/21/2014  . GERD (gastroesophageal reflux disease)   . H/O: upper GI bleed   . Hypertension   . Morbid obesity with BMI of 50.0-59.9, adult (HCC)   . Obstructive sleep apnea    uses C-Pap  . Oxygen desaturation during sleep at night-1 1/2 L  . Stasis dermatitis of both legs   . Stasis dermatitis of both legs     Patient Active Problem List   Diagnosis Date Noted  . Urinary retention 01/16/2015  . Pain in shoulder 11/28/2014  . Arthralgia of hip 11/28/2014  . BPH (benign prostatic hypertrophy) with urinary retention   . Chronic bacterial prostatitis   . Morbid obesity with BMI of 50.0-59.9, adult (HCC)   . Stasis dermatitis of both legs   . H/O: upper GI bleed   . Essential (primary) hypertension 02/21/2014  . 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 Bladder retention 12/07/2013  . Benign prostatic hyperplasia with urinary obstruction 10/24/2013  . Body mass index (BMI) of 50-59.9 in adult (HCC) 10/11/2013  . Lower urinary tract  infection 09/14/2013  . Acid reflux 01/05/2013  . History of cholecystectomy 01/05/2013  . Adiposity 11/12/2012  . History of surgical procedure 11/12/2012  . Apnea, sleep 06/14/2012    Past Surgical History:  Procedure Laterality Date  . CHOLECYSTECTOMY    . COLONOSCOPY  2006  . GASTRIC BYPASS    . hip replacement Right 2005  . HIP SURGERY     right  . PELVIC EXAMINATION UNDER ANESTHESIA CYSTOURETHROSCOPY / SIGMOIDOSCOPY  2015    Prior to Admission medications   Medication Sig Start Date End Date Taking? Authorizing Provider  Ascorbic Acid (VITAMIN C) 1000 MG tablet Take 1,000 mg by mouth daily.     [provider]  B Complex Vitamins (VITAMIN-B COMPLEX) TABS Take 1 tablet by mouth daily.     [provider]  cephALEXin (KEFLEX) 500 MG capsule Take 1 capsule (500 mg total) by mouth 2 (two) times daily. 04/18/15   Menshew, Charlesetta Ivory, PA-C  ferrous sulfate 325 (65 FE) MG tablet Take 325 mg by mouth daily.     [provider]  Multiple Vitamin (MULTI-VITAMINS) TABS Take 1 tablet by mouth daily.     [provider]  omeprazole (PRILOSEC) 20 MG capsule Take 20 mg by mouth daily.    [provider]  oxyCODONE-acetaminophen (ROXICET) 5-325 MG tablet Take 1 tablet by mouth every 6 (six) hours as needed for moderate pain  or severe pain. 01/16/15   Adrian Saran, MD  pantoprazole (PROTONIX) 40 MG tablet Take 40 mg by mouth daily. Reported on 06/20/2015    [provider]  tamsulosin (FLOMAX) 0.4 MG CAPS capsule Take 0.4 mg by mouth daily.     [provider]  triamterene-hydrochlorothiazide (MAXZIDE-25) 37.5-25 MG tablet Take 1 tablet by mouth daily.    [provider]    Allergies Aspirin  Family History  Problem Relation Age of Onset  . Diabetes Mother   . Prostate cancer Neg Hx   . Bladder Cancer Neg Hx     Social History Social History   Tobacco Use  . Smoking status: Former Smoker    Types:  Cigarettes, Pipe    Last attempt to quit: 06/19/1968    Years since quitting: 48.9  . Smokeless tobacco: Never Used  Substance Use Topics  . Alcohol use: No  . Drug use: No    Review of Systems Constitutional: No fever/chills Eyes: No visual changes. ENT: No sore throat. No stiff neck no neck pain Cardiovascular: Denies chest pain. Respiratory: Denies shortness of breath. Gastrointestinal:   no vomiting.  + diarrhea.  No constipation. Genitourinary: Negative for dysuria. Musculoskeletal: Negative lower extremity swelling Skin: Negative for rash. Neurological: Negative for severe headaches, focal weakness or numbness.   ____________________________________________   PHYSICAL EXAM:  VITAL SIGNS: ED Triage Vitals [05/06/17 1435]  Enc Vitals Group     BP      Pulse      Resp      Temp      Temp src      SpO2      Weight (!) 327 lb (148.3 kg)     Height 5\' 10"  (1.778 m)     Head Circumference      Peak Flow      Pain Score 10     Pain Loc      Pain Edu?      Excl. in GC?     Constitutional: Alert and oriented. Well appearing and in no acute distress. Eyes: Conjunctivae are normal Head: Atraumatic HEENT: No congestion/rhinnorhea. Mucous membranes are moist.  Oropharynx non-erythematous Neck:   Nontender with no meningismus, no masses, no stridor Cardiovascular: Normal rate, regular rhythm. Grossly normal heart sounds.  Good peripheral circulation. Respiratory: Normal respiratory effort.  No retractions. Lungs CTAB. Abdominal: Soft and nontender. No distention. No guarding no rebound orbitally obese, catheter site clean dry and intact catheter is hanging out, only an inch in we did remove it Back:  There is no focal tenderness or step off.  there is no midline tenderness there are no lesions noted. there is no CVA tenderness Musculoskeletal: No lower extremity tenderness, no upper extremity tenderness. No joint effusions, no DVT signs strong distal pulses no  edema Neurologic:  Normal speech and language. No gross focal neurologic deficits are appreciated.  Skin:  Skin is warm, dry and intact. No rash noted. Psychiatric: Mood and affect are normal. Speech and behavior are normal.  ____________________________________________   LABS (all labs ordered are listed, but only abnormal results are displayed)  Labs Reviewed  URINALYSIS, COMPLETE (UACMP) WITH MICROSCOPIC - Abnormal; Notable for the following components:      Result Value   Color, Urine AMBER (*)    APPearance CLOUDY (*)    Hgb urine dipstick LARGE (*)    Protein, ur 30 (*)    Nitrite POSITIVE (*)    Leukocytes, UA LARGE (*)  Bacteria, UA FEW (*)    Squamous Epithelial / LPF 0-5 (*)    All other components within normal limits  CBC - Abnormal; Notable for the following components:   RBC 4.31 (*)    HCT 39.2 (*)    All other components within normal limits  BASIC METABOLIC PANEL - Abnormal; Notable for the following components:   Glucose, Bld 113 (*)    BUN 28 (*)    Calcium 8.6 (*)    All other components within normal limits    Pertinent labs  results that were available during my care of the patient were reviewed by me and considered in my medical decision making (see chart for details). ____________________________________________  EKG  I personally interpreted any EKGs ordered by me or triage  ____________________________________________  RADIOLOGY  Pertinent labs & imaging results that were available during my care of the patient were reviewed by me and considered in my medical decision making (see chart for details). If possible, patient and/or family made aware of any abnormal findings.  No results found. ____________________________________________    PROCEDURES  Procedure(s) performed: Suprapubic catheter replacement Verbal consent, sterile prep, see below, initially tried with a 4216 [place] which seemed to pass but I could not inflate the cuff very  easily, so we replaced it with a 7514 [place] which went in with no difficulty cuff inflated with no difficulty and we will irrigate to validate placement.  Procedures  Critical Care performed: None  ____________________________________________   INITIAL IMPRESSION / ASSESSMENT AND PLAN / ED COURSE  Pertinent labs & imaging results that were available during my care of the patient were reviewed by me and considered in my medical decision making (see chart for details).  I did discuss with urology, Dr. Apolinar JunesBrandon, she did advise replacing the catheter and downsizing if needed I did attempt to place a 16 French catheter and there which is what came out, that was able to pass apparently easily but there was no urinary return possibly because the patient has been having some leakage, and it was a little bit hard to inflate the cuff so I took it out and replaced in his stay with a 11014 [place] which went in easily the cuff inflated with no difficulty.  We will irrigated to make sure is in the right place.  We are giving him limits, patient is in no acute distress.  He will follow closely with urology if this urinary catheter seems to be working analysis results noted, urology aware, they do not feel any further intervention or culture needed which is not unreasonable as he is likely colonized    ____________________________________________   FINAL CLINICAL IMPRESSION(S) / ED DIAGNOSES  Final diagnoses:  None      This chart was dictated using voice recognition software.  Despite best efforts to proofread,  errors can occur which can change meaning.      Jeanmarie PlantMcShane, Adilene Areola A, MD 05/06/17 1807    Jeanmarie PlantMcShane, Nelson Noone A, MD 05/06/17 [phone]

## 2017-05-06 NOTE — ED Notes (Signed)
14 f suprapubic catheter placed by Dr. Alphonzo LemmingsMcShane

## 2017-05-06 NOTE — ED Triage Notes (Addendum)
Pt arrived via POV from home, with reports of suprapubic catheter problem. Pt states he continues to urinate occasionally through his urethra, but states the suprapubic catheter is falling out. Pt states he is due to see urologist - in June.  Pt states he has not had suprapubic catheter changed in several months.  Pt has leg bag attached.   Pt denies any signs or sxs of infection at this time.

## 2017-05-06 NOTE — ED Notes (Signed)
Pt incontinent of stool. Cleaned with warm wipes. Linen changed. Clean chucks placed under pt.

## 2017-05-06 NOTE — ED Notes (Signed)
Pt states this morning when he got up he noticed suprapubic cath pulled out about 6 inches. States he accidentally hooked a blanket around it. Has had cath x 2 years. Denies pain or redness at insertion site. Noticed small amount of blood in tube after tube being pulled out. Alert, oriented, in wheelchair. Pt states "kinda nauseous." states when he "can't urinate through penis it just hurts." states he still urinates some through penis even with cath. Pt has urinated through penis since being in ED.

## 2017-05-07 ENCOUNTER — Encounter: Payer: Self-pay | Admitting: Emergency Medicine

## 2017-05-07 ENCOUNTER — Emergency Department
Admission: EM | Admit: 2017-05-07 | Discharge: 2017-05-07 | Disposition: A | Discharge status: Home / Self Care | Payer: Medicare Other | Attending: Emergency Medicine | Admitting: Emergency Medicine

## 2017-05-07 ENCOUNTER — Other Ambulatory Visit: Payer: Self-pay

## 2017-05-07 DIAGNOSIS — N471 Phimosis: Secondary | ICD-10-CM | POA: Diagnosis not present

## 2017-05-07 DIAGNOSIS — Z87891 Personal history of nicotine dependence: Secondary | ICD-10-CM | POA: Diagnosis not present

## 2017-05-07 DIAGNOSIS — R338 Other retention of urine: Secondary | ICD-10-CM | POA: Diagnosis not present

## 2017-05-07 DIAGNOSIS — I10 Essential (primary) hypertension: Secondary | ICD-10-CM | POA: Diagnosis not present

## 2017-05-07 DIAGNOSIS — Z436 Encounter for attention to other artificial openings of urinary tract: Secondary | ICD-10-CM | POA: Insufficient documentation

## 2017-05-07 DIAGNOSIS — N365 Urethral false passage: Secondary | ICD-10-CM | POA: Diagnosis not present

## 2017-05-07 DIAGNOSIS — T839XXA Unspecified complication of genitourinary prosthetic device, implant and graft, initial encounter: Secondary | ICD-10-CM

## 2017-05-07 DIAGNOSIS — R339 Retention of urine, unspecified: Secondary | ICD-10-CM | POA: Insufficient documentation

## 2017-05-07 DIAGNOSIS — N401 Enlarged prostate with lower urinary tract symptoms: Secondary | ICD-10-CM | POA: Insufficient documentation

## 2017-05-07 DIAGNOSIS — T83028A Displacement of other indwelling urethral catheter, initial encounter: Secondary | ICD-10-CM | POA: Diagnosis not present

## 2017-05-07 NOTE — ED Provider Notes (Signed)
[facility] Emergency Department Provider Note       Time seen: ----------------------------------------- 2:00 PM on 05/07/2017 -----------------------------------------   I have reviewed the triage vital signs and the nursing notes.  HISTORY   Chief Complaint Urinary Retention    HPI [name] is a 72 y.o. male with a history of GERD, cellulitis, prostatitis, morbid obesity, urinary retention with recent suprapubic catheter placement who presents to the ED for difficulty urinating.  Patient states he came here yesterday for suprapubic catheter placement.  Today it was noted that the catheter was not draining well.  Patient states he did not feel full of urine.  He denies fevers, chills or other complaints.  Past Medical History:  Diagnosis Date  . Acid reflux 01/05/2013  . BPH (benign prostatic hypertrophy) with urinary retention   . Cellulitis    foot  . Chronic bacterial prostatitis   . Essential (primary) hypertension 02/21/2014  . GERD (gastroesophageal reflux disease)   . H/O: upper GI bleed   . Hypertension   . Morbid obesity with BMI of 50.0-59.9, adult (HCC)   . Obstructive sleep apnea    uses C-Pap  . Oxygen desaturation during sleep at night-1 1/2 L  . Stasis dermatitis of both legs   . Stasis dermatitis of both legs     Patient Active Problem List   Diagnosis Date Noted  . Urinary retention 01/16/2015  . Pain in shoulder 11/28/2014  . Arthralgia of hip 11/28/2014  . BPH (benign prostatic hypertrophy) with urinary retention   . Chronic bacterial prostatitis   . Morbid obesity with BMI of 50.0-59.9, adult (HCC)   . Stasis dermatitis of both legs   . H/O: upper GI bleed   . Essential (primary) hypertension 02/21/2014  . 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 Bladder retention 12/07/2013  . Benign prostatic hyperplasia with urinary obstruction 10/24/2013  . Body mass index (BMI) of 50-59.9 in adult (HCC) 10/11/2013  . Lower  urinary tract infection 09/14/2013  . Acid reflux 01/05/2013  . History of cholecystectomy 01/05/2013  . Adiposity 11/12/2012  . History of surgical procedure 11/12/2012  . Apnea, sleep 06/14/2012    Past Surgical History:  Procedure Laterality Date  . CHOLECYSTECTOMY    . COLONOSCOPY  2006  . GASTRIC BYPASS    . hip replacement Right 2005  . HIP SURGERY     right  . PELVIC EXAMINATION UNDER ANESTHESIA CYSTOURETHROSCOPY / SIGMOIDOSCOPY  2015    Allergies Aspirin  Social History Social History   Tobacco Use  . Smoking status: Former Smoker    Types: Cigarettes, Pipe    Last attempt to quit: 06/19/1968    Years since quitting: 48.9  . Smokeless tobacco: Never Used  Substance Use Topics  . Alcohol use: No  . Drug use: No    Review of Systems Constitutional: Negative for fever. Cardiovascular: Negative for chest pain. Respiratory: Negative for shortness of breath. Gastrointestinal: Negative for abdominal pain, vomiting and diarrhea. Genitourinary: Positive for oliguria Musculoskeletal: Negative for back pain. Skin: Negative for rash. All systems negative/normal/unremarkable except as stated in the HPI  ____________________________________________   PHYSICAL EXAM:  VITAL SIGNS: ED Triage Vitals  Enc Vitals Group     BP 05/07/17 1112 117/66     Pulse Rate 05/07/17 1112 66     Resp 05/07/17 1112 16     Temp 05/07/17 1112 98.9 F (37.2 C)     Temp Source 05/07/17 1112 Oral     SpO2  05/07/17 1112 97 %     Weight 05/07/17 1114 (!) 327 lb (148.3 kg)     Height 05/07/17 1114 5\' 10"  (1.778 m)     Head Circumference --      Peak Flow --      Pain Score 05/07/17 1113 4     Pain Loc --      Pain Edu? --      Excl. in GC? --    Constitutional: Alert and oriented.  Morbidly obese Eyes: Conjunctivae are normal. Normal extraocular movements. Cardiovascular: Normal rate, regular rhythm. No murmurs, rubs, or gallops. Respiratory: Normal respiratory effort without  tachypnea nor retractions. Breath sounds are clear and equal bilaterally. No wheezes/rales/rhonchi. Gastrointestinal: Soft and nontender. Normal bowel sounds Genitourinary: There is some yeast present near the suprapubic catheter site, suprapubic catheter appears to be in place Musculoskeletal: Nontender with normal range of motion in extremities. No lower extremity tenderness nor edema. Neurologic:  Normal speech and language. No gross focal neurologic deficits are appreciated.  Skin:  Skin is warm, dry and intact.  Erythema noted to the suprapubic area ____________________________________________  ED COURSE:  As part of my medical decision making, I reviewed the following data within the electronic MEDICAL RECORD NUMBER History obtained from family if available, nursing notes, old chart and ekg, as well as notes from prior ED visits. Patient presented for Foley catheter dysfunction, we will assess with labs and imaging as indicated at this time.   We attempted to place a 16 French Foley catheter which appeared to slide in easily but he had pain with insufflation of the balloon.  We then attempted to irrigate which caused more pain.  I subsequently deflated the balloon and withdrew the catheter.  I will discussed with urology for placement.   Procedures ____________________________________________   LABS (pertinent positives/negatives)  Labs Reviewed  URINALYSIS, COMPLETE (UACMP) WITH MICROSCOPIC    RADIOLOGY Bladder scan Did not reveal any significant urinary retention ____________________________________________  DIFFERENTIAL DIAGNOSIS   Foley catheter dysfunction, clogged Foley catheter, dislodgment, UTI  FINAL ASSESSMENT AND PLAN  Catheter dysfunction   Plan: The patient had presented for decreased urine output.  I was unable to replace his suprapubic catheter.  I discussed the case with Dr. Alvester MorinBell from urology.  He was also unable to pass the suprapubic catheter.  He then  attempted to place a traditional Foley catheter but was unsuccessful.  He discussed with interventional radiology and he will have an IR placed Foley catheter tomorrow.  He is currently not retaining, on bladder scan there is not significant fluid accumulation and he can urinate through his penis at this point.   Ulice DashJohnathan E Matraca Hunkins, MD   Note: This note was generated in part or whole with voice recognition software. Voice recognition is usually quite accurate but there are transcription errors that can and very often do occur. I apologize for any typographical errors that were not detected and corrected.     Emily FilbertWilliams, Roger Kettles E, MD 05/07/17 [id number]

## 2017-05-07 NOTE — ED Notes (Signed)
Pt. Verbalizes understanding of d/c instructions and follow-up. VS stable. Pt. In NAD at time of d/c and denies further concerns regarding this visit. Pt. Stable at the time of departure from the unit, departing unit by the safest and most appropriate manner per that pt condition and limitations with all belongings accounted for. Pt advised to return to the ED at any time for emergent concerns, or for new/worsening symptoms.   

## 2017-05-07 NOTE — Consult Note (Signed)
H&P Physician requesting consult: Daryel November, MD  Chief Complaint: Displaced suprapubic tube  History of Present Illness: 72 year old male who is morbidly obese with an acquired buried penis with a history of inability to place a urethral catheter 2 years ago.  He therefore had undergo interventional radiology guided placement of a suprapubic tube.  This was performed in 2017.  He states that he had a suprapubic tube, the same tube, for 2 years.  He states it has not been changed.  Yesterday, the tube became dislodged and it was replaced in the emergency department.  At that time it seemed to be noting well and he was discharged.  It has however not really been draining and he presented back to the emergency department today.  He is voiding small amounts at a time per urethra.  Past Medical History:  Diagnosis Date  . Acid reflux 01/05/2013  . BPH (benign prostatic hypertrophy) with urinary retention   . Cellulitis    foot  . Chronic bacterial prostatitis   . Essential (primary) hypertension 02/21/2014  . GERD (gastroesophageal reflux disease)   . H/O: upper GI bleed   . Hypertension   . Morbid obesity with BMI of 50.0-59.9, adult (HCC)   . Obstructive sleep apnea    uses C-Pap  . Oxygen desaturation during sleep at night-1 1/2 L  . Stasis dermatitis of both legs   . Stasis dermatitis of both legs    Past Surgical History:  Procedure Laterality Date  . CHOLECYSTECTOMY    . COLONOSCOPY  2006  . GASTRIC BYPASS    . hip replacement Right 2005  . HIP SURGERY     right  . PELVIC EXAMINATION UNDER ANESTHESIA CYSTOURETHROSCOPY / SIGMOIDOSCOPY  2015    Home Medications:   (Not in a hospital admission) Allergies:  Allergies  Allergen Reactions  . Aspirin Other (See Comments)    Stomach bleed    Family History  Problem Relation Age of Onset  . Diabetes Mother   . Prostate cancer Neg Hx   . Bladder Cancer Neg Hx    Social History:  reports that he quit smoking about 48  years ago. His smoking use included cigarettes and pipe. He has never used smokeless tobacco. He reports that he does not drink alcohol or use drugs.  ROS: A complete review of systems was performed.  All systems are negative except for pertinent findings as noted. ROS   Physical Exam:  Vital signs in last 24 hours: Temp:  [98.2 F (36.8 C)-98.9 F (37.2 C)] 98.9 F (37.2 C) (04/11 1112) Pulse Rate:  [61-76] 61 (04/11 1634) Resp:  [16-18] 18 (04/11 1634) BP: (117-144)/(66-72) 126/72 (04/11 1634) SpO2:  [96 %-98 %] 98 % (04/11 1634) Weight:  [148.3 kg (327 lb)] 148.3 kg (327 lb) (04/11 1114) General:  Alert and oriented, No acute distress HEENT: Normocephalic, atraumatic Neck: No JVD or lymphadenopathy Cardiovascular: Regular rate and rhythm Lungs: Regular rate and effort Abdomen: Soft, nontender, nondistended, no abdominal masses, suprapubic tract without erythema or drainage.  He is morbidly obese.  He has a large suprapubic fat pad causing acquired buried penis Back: No CVA tenderness Extremities: No edema Neurologic: Grossly intact  Laboratory Data:  No results found for this or any previous visit (from the past 24 hour(s)). No results found for this or any previous visit (from the past 240 hour(s)). Creatinine: Recent Labs    05/06/17 1442  CREATININE 1.15   Procedure: Cystoscopy of suprapubic tract attempted ureteroscopy I  first tried to place a suprapubic tube with a 16 French catheter.  This was unsuccessful.  A 17 French flexible cystoscope was advanced into the suprapubic fat pad area of leading to the penis.  This revealed phimosis and therefore I was unable to access the glans/meatus.  Retrograde access to the bladder was therefore aborted.  I then advanced the scope into the suprapubic tract.  I attempted to navigate into the bladder but a false passage had been created and I was unable to navigate the scope into the bladder.  The patient was experiencing a great  amount of pain from water filling his faults passage space and therefore the procedure was aborted.  Bedside bladder ultrasound revealed a minimally distended bladder with only a small amount of urine in it.  Impression/Assessment:  History urinary retention requiring suprapubic tube placement: The patient is currently voiding small amounts per urethra and his bladder is emptying.  Not safe to try to place a suprapubic catheter at the bedside or in the operating room with a sharp trocar.  Recommend interventional radiology placement under imaging guidance.  Given that he is voiding, this does not need to be done tonight and he can be discharged.  I spoke with interventional radiology who plans to put him on the schedule for tomorrow.  I informed the patient that he should remain n.p.o. at midnight.  He expressed understanding.  I was told that interventional radiology will be contacting him tomorrow.   Ray ChurchEugene D Theresia Pree, III 05/07/2017, 6:31 PM

## 2017-05-07 NOTE — ED Triage Notes (Signed)
Said he did not feel full of urine.  Says he can void normally, but it doesn't always work.

## 2017-05-07 NOTE — ED Triage Notes (Signed)
Came her yesterday for suprapubic cath replacement.  Today it is not draining.

## 2017-05-07 NOTE — ED Notes (Signed)
This RN attempted to irrigate foley. Unsuccessful. Unable to push syringe. Got approx 5 cc of fluid in and noticed it was leaking out at suprapubic site.

## 2017-05-07 NOTE — ED Notes (Addendum)
Pt states "I think there is a clog" in his suprapubic catheter. Pt was seen here yesterday s/p pulling out catheter while sleeping. Dr. Alphonzo LemmingsMcShane saw pt yesterday and replaced with 14 F catheter. Pt originally had 16 F catheter in but EDP was unable to place same size catheter. Pt states it was working well until late last night and it hasn't drained.  Malodorous blood-tinged drainage noted around suprapubic catheter site.

## 2017-05-07 NOTE — ED Notes (Signed)
Spoke with pt about wait times and what to expect next. Advised pt that I am available for further questions if needed.  

## 2017-05-08 ENCOUNTER — Telehealth: Payer: Self-pay | Admitting: Urology

## 2017-05-08 ENCOUNTER — Other Ambulatory Visit: Payer: Self-pay | Admitting: Radiology

## 2017-05-08 ENCOUNTER — Other Ambulatory Visit: Payer: Self-pay | Admitting: Student

## 2017-05-08 ENCOUNTER — Ambulatory Visit
Admission: RE | Admit: 2017-05-08 | Discharge: 2017-05-08 | Disposition: A | Discharge status: Home / Self Care | Payer: Medicare Other | Source: Ambulatory Visit | Attending: Urology | Admitting: Urology

## 2017-05-08 DIAGNOSIS — R339 Retention of urine, unspecified: Secondary | ICD-10-CM | POA: Insufficient documentation

## 2017-05-08 DIAGNOSIS — Z436 Encounter for attention to other artificial openings of urinary tract: Secondary | ICD-10-CM | POA: Diagnosis present

## 2017-05-08 HISTORY — PX: IR CATHETER TUBE CHANGE: IMG717

## 2017-05-08 LAB — URINALYSIS, ROUTINE W REFLEX MICROSCOPIC
BILIRUBIN URINE: NEGATIVE
Glucose, UA: NEGATIVE mg/dL
Ketones, ur: NEGATIVE mg/dL
NITRITE: POSITIVE — AB
Protein, ur: NEGATIVE mg/dL
SPECIFIC GRAVITY, URINE: 1.015 (ref 1.005–1.030)
pH: 5 (ref 5.0–8.0)

## 2017-05-08 MED ORDER — SODIUM CHLORIDE 0.9 % IV SOLN
INTRAVENOUS | Status: DC
  Administered 2017-05-08: 12:00:00 via INTRAVENOUS

## 2017-05-08 MED ORDER — LIDOCAINE VISCOUS 2 % MT SOLN
OROMUCOSAL | Status: AC
  Administered 2017-05-08: 12:00:00
  Filled 2017-05-08: qty 45

## 2017-05-08 MED ORDER — FENTANYL CITRATE (PF) 100 MCG/2ML IJ SOLN
INTRAMUSCULAR | Status: AC
  Filled 2017-05-08: qty 2

## 2017-05-08 MED ORDER — IOPAMIDOL (ISOVUE-300) INJECTION 61%
30.0000 mL | Freq: Once | INTRAVENOUS | Status: AC | PRN
  Administered 2017-05-08: 12:00:00 18 mL

## 2017-05-08 MED ORDER — MIDAZOLAM HCL 2 MG/2ML IJ SOLN
INTRAMUSCULAR | Status: AC
  Filled 2017-05-08: qty 2

## 2017-05-08 MED ORDER — CEFAZOLIN SODIUM-DEXTROSE 2-4 GM/100ML-% IV SOLN
2.0000 g | INTRAVENOUS | Status: AC
Start: 2017-05-08 — End: 2017-05-08
  Administered 2017-05-08: 2 g via INTRAVENOUS

## 2017-05-08 NOTE — OR Nursing (Signed)
[facility] call regarding insertion of new Suprapubic cather. Urine cloudy. Darryl LentKeri MacDonald OKed order for UA and office will be calling in antibiotic prescription to [facility] [place]. (order placed from medical director of [facility] as instructed0

## 2017-05-08 NOTE — Discharge Instructions (Signed)
Suprapubic Catheter Home Guide °A suprapubic catheter is a rubber tube used to drain urine from the bladder into a collection bag. The catheter is inserted into the bladder through a small opening in the in the lower abdomen, near the center of the body, above the pubic bone (suprapubic area). There is a tiny balloon filled with germ-free (sterile) water on the end of the catheter that is in the bladder. The balloon helps to keep the catheter in place. °Your suprapubic catheter may need to be replaced every 4-6 weeks, or as often as recommended by your health care provider. The collection bag must be emptied every day and cleaned every 2-3 days. The collection bag can be put beside your bed at night and attached to your leg during the day. You may have a large collection bag to use at night and a smaller one to use during the day. °What are the risks? °· Urine flow can become blocked. This can happen if the catheter is not working correctly, or if you have a blood clot in your bladder or in the catheter. °· Tissue near the catheter may can become irritated and bleed. °· Bacteria may get into your bladder and cause a urinary tract infection. °How do I change the catheter? °Supplies needed °· Two pairs of sterile gloves. °· Catheter. °· Two syringes. °· Sterile water. °· Sterile cleaning solution. °· Lubricant. °· Collection bags. °Changing the catheter °To replace your catheter, take the following steps: °1. Drink plenty of fluids during the hours before you plan to change the catheter. °2. Wash your hands with soap and water. If soap and water are not available, use hand sanitizer. °3. Lie on your back and put on sterile gloves. °4. Clean the skin around the catheter opening using the sterile cleaning solution. °5. Remove the water from the balloon using a syringe. °6. Slowly remove the catheter. °? Do not pull on the catheter if it seems stuck. °? Call your health care provider immediately if you have difficulty  removing the catheter. °7. Take off the used gloves, and put on a new pair. °8. Put lubricant on the end of the new catheter that will go into your bladder. °9. Gently slide the catheter through the opening in your abdomen and into your bladder. °10. Wait for some urine to start flowing through the catheter. When urine starts to flow through the catheter, use a new syringe to fill the balloon with sterile water. °11. Attach the collection bag to the end of the catheter. Make sure the connection is tight. °12. Remove the gloves and wash your hands with soap and water. ° °How do I care for my skin around the catheter? °Use a clean washcloth and soapy water to clean the skin around your catheter every day. Pat the area dry with a clean towel. °· Do not pull on the catheter. °· Do not use ointment or lotion on this area unless told by your health care provider. °· Check your skin around the catheter every day for signs of infection. Check for: °? Redness, swelling, or pain. °? Fluid or blood. °? Warmth. °? Pus or a bad smell. ° °How do I clean and empty the collection bag? °Clean the collection bag every 2-3 days, or as often as told by your health care provider. To do this, take the following steps: °· Wash your hands with soap and water. If soap and water are not available, use hand sanitizer. °· Disconnect the bag   from the catheter and immediately attach a new bag to the catheter. °· Empty the used bag completely. °· Clean the used bag using one of the following methods: °? Rinse the used bag with warm water and soap. °? Fill the bag with water and add 1 tsp of vinegar. Let it sit for about 30 minutes, then empty the bag. °· Let the bag dry completely, and put it in a clean plastic bag before storing it. ° °Empty the large collection bag every 8 hours. Empty the small collection bag when it is about ? full. To empty your large or small collection bag, take the following steps: °· Always keep the bag below the level  of the catheter. This keeps urine from flowing backwards into the catheter. °· Hold the bag over the toilet or another container. Turn the valve (spigot) at the bottom of the bag to empty the urine. °? Do not touch the opening of the spigot. °? Do not let the opening touch the toilet or container. °· Close the spigot tightly when the bag is empty. ° °What are some general tips? °· Always wash your hands before and after caring for your catheter and collection bag. Use a mild, fragrance-free soap. If soap and water are not available, use hand sanitizer. °· Clean the catheter with soap and water as often as told by your health care provider. °· Always make sure there are no twists or curls (kinks) in the catheter tube. °· Always make sure there are no leaks in the catheter or collection bag. °· Drink enough fluid to keep your urine clear or pale yellow. °· Do not take baths, swim, or use a hot tub. °When should I seek medical care? °Seek medical care if: °· You leak urine. °· You have redness, swelling, or pain around your catheter opening. °· You have fluid or blood coming from your catheter opening. °· Your catheter opening feels warm to the touch. °· You have pus or a bad smell coming from your catheter opening. °· You have a fever or chills. °· Your urine flow slows down. °· Your urine becomes cloudy or smelly. ° °When should I seek immediate medical care? °Seek immediate medical care if your catheter comes out, or if you have: °· Nausea. °· Back pain. °· Difficulty changing your catheter. °· Blood in your urine. °· No urine flow for 1 hour. ° °This information is not intended to replace advice given to you by your health care provider. Make sure you discuss any questions you have with your health care provider. °Document Released: 10/01/2010 Document Revised: 09/12/2015 Document Reviewed: 09/26/2014 °Elsevier Interactive Patient Education © 2018 [employer] ° °

## 2017-05-08 NOTE — Procedures (Signed)
Pre procedural Dx: Urinary Retention Post procedural Dx: Same  Technically successful fluoro guided replacement of a new 16 Fr drainage catheter placement into the urinary bladder yielding slightly cloudy though non-foul smelling urine.    EBL: None  Complications: None immediate  Katherina RightJay Ewen Varnell, MD Pager #: [phone]

## 2017-05-08 NOTE — Telephone Encounter (Signed)
App made and mailed ° °[name] ° °

## 2017-05-08 NOTE — Telephone Encounter (Signed)
-----   Message from Crista ElliotEugene D Bell III, MD sent at 05/07/2017  7:03 PM EDT ----- Schedule follow-up with Dr. Sherryl BartersBudzyn in 1 month

## 2017-05-10 LAB — URINE CULTURE: Special Requests: NORMAL

## 2017-05-15 ENCOUNTER — Ambulatory Visit
Admission: RE | Admit: 2017-05-15 | Discharge: 2017-05-15 | Disposition: A | Discharge status: Home / Self Care | Payer: Medicare Other | Source: Ambulatory Visit | Attending: Interventional Radiology | Admitting: Interventional Radiology

## 2017-05-15 ENCOUNTER — Other Ambulatory Visit (HOSPITAL_COMMUNITY): Payer: Self-pay | Admitting: Interventional Radiology

## 2017-05-15 ENCOUNTER — Ambulatory Visit: Payer: Medicare Other

## 2017-05-15 DIAGNOSIS — Z466 Encounter for fitting and adjustment of urinary device: Secondary | ICD-10-CM | POA: Diagnosis not present

## 2017-05-15 DIAGNOSIS — R339 Retention of urine, unspecified: Secondary | ICD-10-CM | POA: Diagnosis not present

## 2017-05-15 MED ORDER — IOPAMIDOL (ISOVUE-300) INJECTION 61%
10.0000 mL | Freq: Once | INTRAVENOUS | Status: AC | PRN
  Administered 2017-05-15: 10 mL

## 2017-06-11 ENCOUNTER — Encounter: Payer: Self-pay | Admitting: Urology

## 2017-06-11 ENCOUNTER — Ambulatory Visit (INDEPENDENT_AMBULATORY_CARE_PROVIDER_SITE_OTHER): Payer: Medicare Other | Admitting: Urology

## 2017-06-11 VITALS — BP 157/81 | HR 72 | Ht 70.0 in | Wt 325.7 lb

## 2017-06-11 DIAGNOSIS — R339 Retention of urine, unspecified: Secondary | ICD-10-CM | POA: Diagnosis not present

## 2017-06-11 NOTE — Progress Notes (Signed)
06/11/2017 11:49 AM   [name] 11-20-1945 [id number]  Referring provider: Etheleen Nicks, NP [address] [name], [place] [address]  Chief Complaint  Patient presents with  . 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  Previous attempts at catheter placement dating back to 2005 were unsuccessful due to the patient's anatomy.  He was last seen in our office on May 03, 2015.  He was then lost to follow-up and not seen again by this practice until he was admitted to the hospital in April 2019 after his suprapubic catheter fell out.  He apparently had not had his catheter exchanged in 2 years. It was replaced by interventional radiology in April 2019.   PMH: Past Medical History:  Diagnosis Date  . Acid reflux 01/05/2013  . BPH (benign prostatic hypertrophy) with urinary retention   . Cellulitis    foot  . Chronic bacterial prostatitis   . Essential (primary) hypertension 02/21/2014  . GERD (gastroesophageal reflux disease)   . H/O: upper GI bleed   . Hypertension   . Morbid obesity with BMI of 50.0-59.9, adult (HCC)   . Obstructive sleep apnea    uses C-Pap  . Oxygen desaturation during sleep at night-1 1/2 L  . Stasis dermatitis of both legs   . Stasis dermatitis of both legs     Surgical History: Past Surgical History:  Procedure Laterality Date  . CHOLECYSTECTOMY    . COLONOSCOPY  2006  . GASTRIC BYPASS    . hip replacement Right 2005  . HIP SURGERY     right  . IR CATHETER TUBE CHANGE  05/08/2017  . PELVIC EXAMINATION UNDER ANESTHESIA CYSTOURETHROSCOPY / SIGMOIDOSCOPY  2015    Home Medications:  Allergies as of 06/11/2017      Reactions   Aspirin Other (See  Comments)   Stomach bleed      Medication List        Accurate as of 06/11/17 11:49 AM. Always use your most recent med list.          ferrous sulfate 325 (65 FE) MG tablet Take 325 mg by mouth daily.   MULTI-VITAMINS Tabs Take 1 tablet by mouth daily.   omeprazole 20 MG capsule Commonly known as:  PRILOSEC Take 20 mg by mouth daily.   PROTONIX 40 MG tablet Generic drug:  pantoprazole Take 40 mg by mouth daily. Reported on 06/20/2015   tamsulosin 0.4 MG Caps capsule Commonly known as:  FLOMAX Take 0.4 mg by mouth daily.   triamterene-hydrochlorothiazide 37.5-25 MG tablet Commonly known as:  MAXZIDE-25 Take 1 tablet by mouth daily.   vitamin C 1000 MG tablet Take 1,000 mg by mouth daily.   Vitamin-B Complex Tabs Take 1 tablet by mouth daily.       Allergies:  Allergies  Allergen Reactions  . Aspirin Other (See Comments)    Stomach bleed    Family History: Family History  Problem Relation Age of Onset  . Diabetes Mother   . Prostate cancer Neg Hx   . Bladder Cancer Neg Hx     Social History:  reports that he quit smoking about 49 years ago. His smoking use included cigarettes and pipe. He has never used smokeless tobacco. He reports  that he does not drink alcohol or use drugs.  ROS: UROLOGY Frequent Urination?: No Hard to postpone urination?: No Burning/pain with urination?: No Get up at night to urinate?: No Leakage of urine?: No Urine stream starts and stops?: No Trouble starting stream?: No Do you have to strain to urinate?: No Blood in urine?: No Urinary tract infection?: No Sexually transmitted disease?: No Injury to kidneys or bladder?: No Painful intercourse?: No Weak stream?: No Erection problems?: No Penile pain?: No  Gastrointestinal Nausea?: No Vomiting?: No Indigestion/heartburn?: No Diarrhea?: No Constipation?: No  Constitutional Fever: No Night sweats?: No Weight loss?: No Fatigue?: No  Skin Skin rash/lesions?:  No Itching?: No  Eyes Blurred vision?: No Double vision?: No  Ears/Nose/Throat Sore throat?: No Sinus problems?: No  Hematologic/Lymphatic Swollen glands?: No Easy bruising?: No  Cardiovascular Leg swelling?: Yes Chest pain?: No  Respiratory Cough?: No Shortness of breath?: No  Endocrine Excessive thirst?: No  Musculoskeletal Back pain?: No Joint pain?: No  Neurological Headaches?: No Dizziness?: No  Psychologic Depression?: No Anxiety?: No  Physical Exam: BP (!) 157/81 (BP Location: Right Arm, Patient Position: Sitting, Cuff Size: Large)   Pulse 72   Ht  (1.778 m)   Wt (!) 325 lb 11.2 oz (147.7 kg)   BMI 46.73 kg/m   Constitutional:  Alert and oriented, No acute distress. HEENT: [place] AT, moist mucus membranes.  Trachea midline, no masses. Cardiovascular: No clubbing, cyanosis, or edema. Respiratory: Normal respiratory effort, no increased work of breathing. GI: Abdomen is soft, nontender, nondistended, no abdominal masses GU: No CVA tenderness.  16 French pigtail SP tube in place draining clear yellow urine Skin: No rashes, bruises or suspicious lesions. Lymph: No cervical or inguinal adenopathy. Neurologic: Grossly intact, no focal deficits, moving all 4 extremities. Psychiatric: Normal mood and affect.  Laboratory Data: Lab Results  Component Value Date   WBC 9.7 05/06/2017   HGB 13.5 05/06/2017   HCT 39.2 (L) 05/06/2017   MCV 90.9 05/06/2017   PLT 234 05/06/2017    Lab Results  Component Value Date   CREATININE 1.15 05/06/2017    No results found for: PSA  No results found for: TESTOSTERONE  Lab Results  Component Value Date   HGBA1C 5.1 01/16/2015    Urinalysis    Component Value Date/Time   COLORURINE YELLOW (A) 05/08/2017 1252   APPEARANCEUR CLOUDY (A) 05/08/2017 1252   APPEARANCEUR Cloudy (A) 04/17/2015 1518   LABSPEC 1.015 05/08/2017 1252   LABSPEC 1.020 07/24/2013 1225   PHURINE 5.0 05/08/2017 1252   GLUCOSEU  NEGATIVE 05/08/2017 1252   GLUCOSEU NEGATIVE 07/24/2013 1225   HGBUR MODERATE (A) 05/08/2017 1252   BILIRUBINUR NEGATIVE 05/08/2017 1252   BILIRUBINUR Negative 04/17/2015 1518   BILIRUBINUR NEGATIVE 07/24/2013 1225   KETONESUR NEGATIVE 05/08/2017 1252   PROTEINUR NEGATIVE 05/08/2017 1252   NITRITE POSITIVE (A) 05/08/2017 1252   LEUKOCYTESUR LARGE (A) 05/08/2017 1252   LEUKOCYTESUR 1+ (A) 04/17/2015 1518   LEUKOCYTESUR 1+ 07/24/2013 1225    Procedure:  The patient's current 54 French pigtail SP tube with carefully removed.  A resh 16 French Foley catheter was then placed with return of clear yellow urine.  Assessment & Plan:    1.  Urinary retention Patient suprapubic catheter was exchanged today.  We had a long discussion about the importance of having it changed on a monthly basis.  He will follow-up in our office in 1 month for SP tube exchange.  He will follow-up with the provider on  an annual basis.   Return for monthly NV for SP tube change, annually with provider.  Hildred Laser, MD  [facility] [address] [place], [place] [address] [phone]

## 2017-07-16 ENCOUNTER — Ambulatory Visit: Payer: Medicare Other

## 2017-07-22 ENCOUNTER — Ambulatory Visit (INDEPENDENT_AMBULATORY_CARE_PROVIDER_SITE_OTHER): Payer: Medicare Other

## 2017-07-22 DIAGNOSIS — R339 Retention of urine, unspecified: Secondary | ICD-10-CM

## 2017-07-22 NOTE — Progress Notes (Signed)
Suprapubic Cath Change  Patient is present today for a suprapubic catheter change due to urinary retention.  8ml of water was drained from the balloon, a 16FR foley cath was removed from the tract with out difficulty.  Site was cleaned and prepped in a sterile fashion with betadine.  A 16FR foley cath was replaced into the tract no complications were noted. Urine return was noted, 10 ml of sterile water was inflated into the balloon and a leg bag was attached for drainage.  Patient tolerated well. A night bag was given to patient and proper instruction was given on how to switch bags.    Preformed by: Eligha BridegroomSarah Nashaun Hillmer, CMA  Follow up: 4-6wk cath change

## 2017-08-17 ENCOUNTER — Ambulatory Visit (INDEPENDENT_AMBULATORY_CARE_PROVIDER_SITE_OTHER): Payer: Medicare Other | Admitting: Urology

## 2017-08-17 ENCOUNTER — Encounter: Payer: Self-pay | Admitting: Urology

## 2017-08-17 VITALS — BP 136/78 | HR 66 | Ht 70.0 in | Wt 325.3 lb

## 2017-08-17 DIAGNOSIS — R339 Retention of urine, unspecified: Secondary | ICD-10-CM | POA: Diagnosis not present

## 2017-08-17 NOTE — Progress Notes (Signed)
Suprapubic Cath Change  Patient is present today for a suprapubic catheter change due to urinary retention.  10ml of water was drained from the balloon, a 16FR foley cath was removed from the tract with out difficulty.  Site was cleaned and prepped in a sterile fashion with betadine.  A 16FR foley cath was replaced into the tract no complications were noted. Urine return was noted, 10 ml of sterile water was inflated into the balloon and a leg bag was attached with an extender for drainage.  Patient tolerated well.   Preformed by: Teressa Lowerarrie Malinda Mayden, CMA  Follow up: 1 month

## 2017-08-17 NOTE — Progress Notes (Signed)
08/17/2017 1:31 PM   [name] 1945-11-05 [id number]  Referring provider: Etheleen Nicks, NP [address] [name], [place] [address]  No chief complaint on file.   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  Previous attempts at catheter placement dating back to 2005 were unsuccessful due to the patient's anatomy.  He was last seen in our office on May 03, 2015.  He was then lost to follow-up and not seen again by this practice until he was admitted to the hospital in April 2019 after his suprapubic catheter fell out.  He apparently had not had his catheter exchanged in 2 years. It was replaced by interventional radiology in April 2019.  Today I was asked to meet the patient and he is aware that our new urologist will likely be taking over his chronic surveillance.  The patient did not want to see other physicians.  Catheter is draining well.  Clinically not infected.  Incomplete bladder emptying stable    PMH: Past Medical History:  Diagnosis Date  . Acid reflux 01/05/2013  . BPH (benign prostatic hypertrophy) with urinary retention   . Cellulitis    foot  . Chronic bacterial prostatitis   . Essential (primary) hypertension 02/21/2014  . GERD (gastroesophageal reflux disease)   . H/O: upper GI bleed   . Hypertension   . Morbid obesity with BMI of 50.0-59.9, adult (HCC)   . Obstructive sleep apnea    uses C-Pap  . Oxygen desaturation during sleep at night-1 1/2 L  . Stasis dermatitis of both legs   . Stasis dermatitis of both legs     Surgical History: Past Surgical History:  Procedure Laterality Date  . CHOLECYSTECTOMY    . COLONOSCOPY  2006  . GASTRIC BYPASS    . hip replacement  Right 2005  . HIP SURGERY     right  . IR CATHETER TUBE CHANGE  05/08/2017  . PELVIC EXAMINATION UNDER ANESTHESIA CYSTOURETHROSCOPY / SIGMOIDOSCOPY  2015    Home Medications:  Allergies as of 08/17/2017      Reactions   Aspirin Other (See Comments)   Stomach bleed      Medication List        Accurate as of 08/17/17  1:31 PM. Always use your most recent med list.          ferrous sulfate 325 (65 FE) MG tablet Take 325 mg by mouth daily.   MULTI-VITAMINS Tabs Take 1 tablet by mouth daily.   omeprazole 20 MG capsule Commonly known as:  PRILOSEC Take 20 mg by mouth daily.   PROTONIX 40 MG tablet Generic drug:  pantoprazole Take 40 mg by mouth daily. Reported on 06/20/2015   tamsulosin 0.4 MG Caps capsule Commonly known as:  FLOMAX Take 0.4 mg by mouth daily.   triamterene-hydrochlorothiazide 37.5-25 MG tablet Commonly known as:  MAXZIDE-25 Take 1 tablet by mouth daily.   vitamin C 1000 MG tablet Take 1,000 mg by mouth daily.   Vitamin-B Complex Tabs Take 1 tablet by mouth daily.       Allergies:  Allergies  Allergen Reactions  . Aspirin Other (See Comments)    Stomach bleed    Family History: Family History  Problem Relation Age of Onset  .  Diabetes Mother   . Prostate cancer Neg Hx   . Bladder Cancer Neg Hx     Social History:  reports that he quit smoking about 49 years ago. His smoking use included cigarettes and pipe. He has never used smokeless tobacco. He reports that he does not drink alcohol or use drugs.  ROS:                                        Physical Exam: There were no vitals taken for this visit.  Constitutional:  Alert and oriented, No acute distress.  Laboratory Data: Lab Results  Component Value Date   WBC 9.7 05/06/2017   HGB 13.5 05/06/2017   HCT 39.2 (L) 05/06/2017   MCV 90.9 05/06/2017   PLT 234 05/06/2017    Lab Results  Component Value Date   CREATININE 1.15 05/06/2017    No results  found for: PSA  No results found for: TESTOSTERONE  Lab Results  Component Value Date   HGBA1C 5.1 01/16/2015    Urinalysis    Component Value Date/Time   COLORURINE YELLOW (A) 05/08/2017 1252   APPEARANCEUR CLOUDY (A) 05/08/2017 1252   APPEARANCEUR Cloudy (A) 04/17/2015 1518   LABSPEC 1.015 05/08/2017 1252   LABSPEC 1.020 07/24/2013 1225   PHURINE 5.0 05/08/2017 1252   GLUCOSEU NEGATIVE 05/08/2017 1252   GLUCOSEU NEGATIVE 07/24/2013 1225   HGBUR MODERATE (A) 05/08/2017 1252   BILIRUBINUR NEGATIVE 05/08/2017 1252   BILIRUBINUR Negative 04/17/2015 1518   BILIRUBINUR NEGATIVE 07/24/2013 1225   KETONESUR NEGATIVE 05/08/2017 1252   PROTEINUR NEGATIVE 05/08/2017 1252   NITRITE POSITIVE (A) 05/08/2017 1252   LEUKOCYTESUR LARGE (A) 05/08/2017 1252   LEUKOCYTESUR 1+ (A) 04/17/2015 1518   LEUKOCYTESUR 1+ 07/24/2013 1225    Pertinent Imaging:   Assessment & Plan: Nurse suprapubic tube changes monthly and see urology in approximately 1 year  There are no diagnoses linked to this encounter.  No follow-ups on file.  [name],[name] A, MD  [facility] [address], [address] [place], [place] [id number] [phone]

## 2017-09-21 ENCOUNTER — Ambulatory Visit (INDEPENDENT_AMBULATORY_CARE_PROVIDER_SITE_OTHER): Payer: Medicare Other

## 2017-09-21 DIAGNOSIS — R339 Retention of urine, unspecified: Secondary | ICD-10-CM

## 2017-09-21 NOTE — Progress Notes (Signed)
Suprapubic Cath Change  Patient is present today for a suprapubic catheter change due to urinary retention.  10ml of water was drained from the balloon, a 16FR foley cath was removed from the tract with out difficulty.  Site was cleaned and prepped in a sterile fashion with betadine.  A 16FR foley cath was replaced into the tract with extender no complications were noted. Urine return was noted, 10 ml of sterile water was inflated into the balloon and a leg bag was attached for drainage.  Patient tolerated well. A night bag was given to patient and proper instruction was given on how to switch bags.    Preformed by: Nydia Boutonhristopher Thompson, CMA  Follow up: 1 Month for SPT change

## 2017-10-19 ENCOUNTER — Ambulatory Visit (INDEPENDENT_AMBULATORY_CARE_PROVIDER_SITE_OTHER): Payer: Medicare Other

## 2017-10-19 DIAGNOSIS — R339 Retention of urine, unspecified: Secondary | ICD-10-CM | POA: Diagnosis not present

## 2017-10-19 NOTE — Progress Notes (Signed)
Suprapubic Cath Change  Patient is present today for a suprapubic catheter change due to urinary retention.  10ml of water was drained from the balloon, a 16FR foley cath was removed from the tract with out difficulty.  Site was cleaned and prepped in a sterile fashion with betadine.  A 16FR foley cath was replaced into the tract no complications were noted. Urine return was noted, 10 ml of sterile water was inflated into the balloon and a leg bag was attached for drainage.  Patient tolerated well. A night bag was given to patient and proper instruction was given on how to switch bags.    Preformed by: Nydia Boutonhristopher Thompson, CMA  Follow up: 1 month for SPT change.

## 2017-10-30 ENCOUNTER — Other Ambulatory Visit
Admission: RE | Admit: 2017-10-30 | Discharge: 2017-10-30 | Disposition: A | Discharge status: Home / Self Care | Payer: Medicare Other | Source: Ambulatory Visit | Attending: Physician Assistant | Admitting: Physician Assistant

## 2017-10-30 ENCOUNTER — Encounter: Payer: Medicare Other | Attending: Physician Assistant | Admitting: Physician Assistant

## 2017-10-30 DIAGNOSIS — Z8249 Family history of ischemic heart disease and other diseases of the circulatory system: Secondary | ICD-10-CM | POA: Diagnosis not present

## 2017-10-30 DIAGNOSIS — Z87891 Personal history of nicotine dependence: Secondary | ICD-10-CM | POA: Diagnosis not present

## 2017-10-30 DIAGNOSIS — I872 Venous insufficiency (chronic) (peripheral): Secondary | ICD-10-CM | POA: Insufficient documentation

## 2017-10-30 DIAGNOSIS — I890 Lymphedema, not elsewhere classified: Secondary | ICD-10-CM | POA: Insufficient documentation

## 2017-10-30 DIAGNOSIS — I10 Essential (primary) hypertension: Secondary | ICD-10-CM | POA: Diagnosis not present

## 2017-10-30 DIAGNOSIS — B999 Unspecified infectious disease: Secondary | ICD-10-CM | POA: Diagnosis present

## 2017-10-30 DIAGNOSIS — Z886 Allergy status to analgesic agent status: Secondary | ICD-10-CM | POA: Diagnosis not present

## 2017-10-30 DIAGNOSIS — Z833 Family history of diabetes mellitus: Secondary | ICD-10-CM | POA: Insufficient documentation

## 2017-10-30 DIAGNOSIS — E11622 Type 2 diabetes mellitus with other skin ulcer: Secondary | ICD-10-CM | POA: Insufficient documentation

## 2017-10-30 DIAGNOSIS — Z809 Family history of malignant neoplasm, unspecified: Secondary | ICD-10-CM | POA: Insufficient documentation

## 2017-10-30 DIAGNOSIS — M1612 Unilateral primary osteoarthritis, left hip: Secondary | ICD-10-CM | POA: Insufficient documentation

## 2017-10-30 DIAGNOSIS — G4733 Obstructive sleep apnea (adult) (pediatric): Secondary | ICD-10-CM | POA: Diagnosis not present

## 2017-10-30 DIAGNOSIS — L97312 Non-pressure chronic ulcer of right ankle with fat layer exposed: Secondary | ICD-10-CM | POA: Diagnosis present

## 2017-11-01 NOTE — Progress Notes (Signed)
RECTOR, DEVONSHIRE ([id number]) Visit Report for 10/30/2017 Abuse/Suicide Risk Screen Details Patient Name: [name], [name]. Date of Service: 10/30/2017 10:30 AM Medical Record Number: [id number] Patient Account Number: [id number] Date of Birth/Sex: [birth year] (72 y.o. M) Treating RN: Renne Crigler Primary Care Kourtlyn Charlet: Etheleen Nicks Other Clinician: Referring Maykayla Highley: Referral, Self Treating Amberrose Friebel/Extender: STONE III, HOYT Weeks in Treatment: 0 Abuse/Suicide Risk Screen Items Answer ABUSE/SUICIDE RISK SCREEN: Has anyone close to you tried to hurt or harm you recentlyo No Do you feel uncomfortable with anyone in your familyo No Has anyone forced you do things that you didnot want to doo No Do you have any thoughts of harming yourselfo No Patient displays signs or symptoms of abuse and/or neglect. No Electronic Signature(s) Signed: 10/30/2017 3:59:43 PM By: Renne Crigler Entered By: Renne Crigler on 10/30/2017 10:57:53 [name] ([id number]) -------------------------------------------------------------------------------- Activities of Daily Living Details Patient Name: [name], [name] Date of Service: 10/30/2017 10:30 AM Medical Record Number: [id number] Patient Account Number: [id number] Date of Birth/Sex: [birth year] (72 y.o. M) Treating RN: Renne Crigler Primary Care Kaikoa Magro: Etheleen Nicks Other Clinician: Referring Amillya Chavira: Referral, Self Treating Kohei Antonellis/Extender: Linwood Dibbles, HOYT Weeks in Treatment: 0 Activities of Daily Living Items Answer Activities of Daily Living (Please select one for each item) Drive Automobile Completely Able Take Medications Completely Able Use Telephone Completely Able Care for Appearance Completely Able Use Toilet Completely Able Bath / Shower Completely Able Dress Self Completely Able Feed Self Completely Able Walk Need Assistance Get In / Out Bed Completely Able Housework Completely Able Prepare Meals Completely  Able Handle Money Completely Able Shop for Self Completely Able Electronic Signature(s) Signed: 10/30/2017 3:59:43 PM By: Renne Crigler Entered By: Renne Crigler on 10/30/2017 10:58:29 [name] ([id number]) -------------------------------------------------------------------------------- Education Assessment Details Patient Name: [name] Alma. Date of Service: 10/30/2017 10:30 AM Medical Record Number: [id number] Patient Account Number: [id number] Date of Birth/Sex: [birth year] (72 y.o. M) Treating RN: Renne Crigler Primary Care Renuka Farfan: Etheleen Nicks Other Clinician: Referring Lexee Brashears: Referral, Self Treating Doniqua Saxby/Extender: Linwood Dibbles, HOYT Weeks in Treatment: 0 Primary Learner Assessed: Patient Learning Preferences/Education Level/Primary Language Learning Preference: Explanation Highest Education Level: College or Above Preferred Language: English Cognitive Barrier Assessment/Beliefs Language Barrier: No Translator Needed: No Memory Deficit: No Emotional Barrier: No Cultural/Religious Beliefs Affecting Medical Care: No Physical Barrier Assessment Impaired Vision: Yes Glasses Impaired Hearing: No Decreased Hand dexterity: No Knowledge/Comprehension Assessment Knowledge Level: High Comprehension Level: High Ability to understand written High instructions: Ability to understand verbal High instructions: Motivation Assessment Anxiety Level: Calm Cooperation: Cooperative Education Importance: Acknowledges Need Interest in Health Problems: Asks Questions Perception: Coherent Willingness to Engage in Self- High Management Activities: Readiness to Engage in Self- High Management Activities: Electronic Signature(s) Signed: 10/30/2017 3:59:43 PM By: Renne Crigler Entered By: Renne Crigler on 10/30/2017 10:58:54 [name] ([id number]) -------------------------------------------------------------------------------- Fall Risk Assessment  Details Patient Name: [name] Alma. Date of Service: 10/30/2017 10:30 AM Medical Record Number: [id number] Patient Account Number: [id number] Date of Birth/Sex: 07/07/1945 (72 y.o. M) Treating RN: Renne Crigler Primary Care Tajuan Dufault: Etheleen Nicks Other Clinician: Referring Janie Strothman: Referral, Self Treating [name]/Extender: Linwood Dibbles, HOYT Weeks in Treatment: 0 Fall Risk Assessment Items Have you had 2 or more falls in the last 12 monthso 0 Yes Have you had any fall that resulted in injury in the last 12 monthso 0 No FALL RISK ASSESSMENT: History of falling - immediate or within 3 months 0 No Secondary diagnosis 0 No Ambulatory aid None/bed rest/wheelchair/nurse 0 No Crutches/cane/walker 0 No Furniture  0 No IV Access/Saline Lock 0 No Gait/Training Normal/bed rest/immobile 0 No Weak 0 No Impaired 0 No Mental Status Oriented to own ability 0 No Electronic Signature(s) Signed: 10/30/2017 3:59:43 PM By: Renne Crigler Entered By: Renne Crigler on 10/30/2017 10:59:18 [name] ([id number]) -------------------------------------------------------------------------------- Nutrition Risk Assessment Details Patient Name: [name] Alma. Date of Service: 10/30/2017 10:30 AM Medical Record Number: [id number] Patient Account Number: [id number] Date of Birth/Sex: October 02, 1945 (72 y.o. M) Treating RN: Renne Crigler Primary Care Morrell Fluke: Etheleen Nicks Other Clinician: Referring Devonne Lalani: Referral, Self Treating Sumie Remsen/Extender: STONE III, HOYT Weeks in Treatment: 0 Height (in): Weight (lbs): Body Mass Index (BMI): Nutrition Risk Assessment Items NUTRITION RISK SCREEN: I have an illness or condition that made me change the kind and/or amount of 0 No food I eat I eat fewer than two meals per day 0 No I eat few fruits and vegetables, or milk products 0 No I have three or more drinks of beer, liquor or wine almost every day 0 No I have tooth or mouth problems that  make it hard for me to eat 0 No I don't always have enough money to buy the food I need 0 No I eat alone most of the time 0 No I take three or more different prescribed or over-the-counter drugs a day 0 No Without wanting to, I have lost or gained 10 pounds in the last six months 0 No I am not always physically able to shop, cook and/or feed myself 0 No Nutrition Protocols Good Risk Protocol 0 No interventions needed Moderate Risk Protocol Electronic Signature(s) Signed: 10/30/2017 3:59:43 PM By: Renne Crigler Entered By: Renne Crigler on 10/30/2017 10:59:31

## 2017-11-02 LAB — AEROBIC CULTURE W GRAM STAIN (SUPERFICIAL SPECIMEN)

## 2017-11-02 LAB — AEROBIC CULTURE  (SUPERFICIAL SPECIMEN)

## 2017-11-03 DIAGNOSIS — E11622 Type 2 diabetes mellitus with other skin ulcer: Secondary | ICD-10-CM | POA: Diagnosis not present

## 2017-11-04 NOTE — Progress Notes (Signed)
KAELOB, PERSKY ([id number]) Visit Report for 10/30/2017 Chief Complaint Document Details Patient Name: [name], [name]. Date of Service: 10/30/2017 10:30 AM Medical Record Number: [id number] Patient Account Number: [id number] Date of Birth/Sex: 1946-01-28 (72 y.o. M) Treating RN: Curtis Sites Primary Care Provider: Etheleen Nicks Other Clinician: Referring Provider: Referral, Self Treating Provider/Extender: Linwood Dibbles, HOYT Weeks in Treatment: 0 Information Obtained from: Patient Chief Complaint Right lateral ankle ulcer Electronic Signature(s) Signed: 11/02/2017 1:32:29 PM By: Lenda Kelp PA-C Entered By: Lenda Kelp on 10/30/2017 11:26:14 Staszewski, [name] ([id number]) -------------------------------------------------------------------------------- Debridement Details Patient Name: [name]. Date of Service: 10/30/2017 10:30 AM Medical Record Number: [id number] Patient Account Number: [id number] Date of Birth/Sex: 1945/08/09 (72 y.o. M) Treating RN: Curtis Sites Primary Care Provider: Etheleen Nicks Other Clinician: Referring Provider: Referral, Self Treating Provider/Extender: Linwood Dibbles, HOYT Weeks in Treatment: 0 Debridement Performed for Wound #6 Right,Lateral Malleolus Assessment: Performed By: Physician STONE III, HOYT E., PA-C Debridement Type: Debridement Level of Consciousness (Pre- Awake and Alert procedure): Pre-procedure Verification/Time Yes - 11:32 Out Taken: Start Time: 11:32 Pain Control: Lidocaine 4% Topical Solution Total Area Debrided (L x W): 2.8 (cm) x 2 (cm) = 5.6 (cm) Tissue and other material Viable, Non-Viable, Slough, Subcutaneous, Skin: Dermis , Skin: Epidermis, Fibrin/Exudate, debrided: Slough Level: Skin/Subcutaneous Tissue Debridement Description: Excisional Instrument: Curette Specimen: Swab, Number of Specimens Taken: 1 Bleeding: Minimum Hemostasis Achieved: Pressure End Time: 11:40 Procedural Pain: 0 Post Procedural  Pain: 0 Response to Treatment: Procedure was tolerated well Level of Consciousness Awake and Alert (Post-procedure): Post Debridement Measurements of Total Wound Length: (cm) 2.8 Width: (cm) 2 Depth: (cm) 0.3 Volume: (cm) 1.319 Character of Wound/Ulcer Post Debridement: Improved Post Procedure Diagnosis Same as Pre-procedure Electronic Signature(s) Signed: 10/30/2017 5:05:51 PM By: Curtis Sites Signed: 11/02/2017 1:32:29 PM By: Lenda Kelp PA-C Entered By: Curtis Sites on 10/30/2017 11:41:19 [name], [name] ([id number]) -------------------------------------------------------------------------------- HPI Details Patient Name: [name]. Date of Service: 10/30/2017 10:30 AM Medical Record Number: [id number] Patient Account Number: [id number] Date of Birth/Sex: 01/16/1946 (71 y.o. M) Treating RN: Curtis Sites Primary Care Provider: Etheleen Nicks Other Clinician: Referring Provider: Referral, Self Treating Provider/Extender: Linwood Dibbles, HOYT Weeks in Treatment: 0 History of Present Illness Location: right lower extremity Quality: Patient has been experiencing much less pain currently Severity: 1 out of 10 Duration: Patient has had the wound for approximately 3 weeks prior to presenting for treatment Timing: Pain occurs most often with manipulation of the wound Context: Patient dropped a cardboard box which struck his leg causing the initial injury which hasn't healed since onset Modifying Factors: Currently patient has been using over-the-counter antibiotic ointment Associated Signs and Symptoms: Patient reports having difficulty standing for long periods. HPI Description: As noted above the patient had a injury due to working in the yard and this rapidly progressed to a ulcerated area with an infection. In October 2014 he did have some vascular studies done but never saw a Careers adviser and never had any surgery for varicose veins. His past medical history is significant  for hypertension, obstructive sleep apnea, morbid obesity, stasis dermatitis of both lower extremities and his past surgical history is suggestive of hip surgery on the right, gastric bypass surgery, cholecystectomy, hip replacement. He has recently been put on Levaquin 500 milligrams daily, Bactrim DS 1 twice a day and Bactroban 2% topical ointment locally. No recent labs or vascular or imaging studies done. 07/11/2014 a venous duplex study was done on 07/05/2014 -- it showed no evidence  of deep vein thrombosis or superficial thrombosis bilaterally. 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 The patient has an appointment to see the vascular surgeons on June 20. 07/18/2014 his appointment with the vascular surgeons is rescheduled for this afternoon. 07/25/2014 -- he saw the vascular surgeon and is going to have a endovenous procedure done on July 29. 08/01/2014 -- he was diagnosed with a UTI and is on ciprofloxacin for 10 days. readmission: 11/27/15 On evaluation today patient is having discomfort in the right ankle region following an open wound from having dropped a cardboard box striking his ankle. He tells me that this has not healed despite many of the measures that he learned from previous injuries when he was seen at our wound center. Therefore he didn't come in for reevaluation today. He has not noted any significant purulent discharge at this point in time. 12/04/15 patient appears to be doing somewhat better at this point in time today in regard to his ankle wound. it is measuring smaller and looking cleaner. 12/11/15; patient wound continues to look improve this is on the right lateral ankle. Debrided of surface slough and nonviable subcutaneous tissue. Using silver alginate 12-18-15 Mr. Haese presents today with his wife for follow-up on right lower extremity venous ulcer. He denies any changes or  complications over the past week. He is tolerating compression therapy to his right lower extremity and has a properly fitting compression hose to the left lower extremity. 12/25/15; patient with a wound on his right lateral lower extremity in the setting of severe venous insufficiency [originally trauma against the box]. His edema is well controlled he is using Prisma. 01/01/16; continued improvement using Prisma. He has severe venous insufficiency however the wound was originally trauma against a box. 01/08/16; his wound is totally epithelialized. We will give him measurements for graded pressure stockings which I think he is going to get at elastic therapy in [facility], [place]. ([id number]) Readmission: 10/30/17 patient presents today for reevaluation in our clinic although it has been since 2017 that we've last seen him. He has a ulcer on the right lateral ankle which he tells me began roughly 2 months ago. He states that he had one similar on the left ankle although this completely healed without any complication. However the one on the right as continue to give him a lot of trouble and is not healing appropriately. He really was not sure what the best thing to put on the area would be and therefore he's just been putting advantage to cover it. Another treatment has been utilized at this time. Fortunately there is not appear to be any evidence of significant infection necessarily although there is some evidence of abnormal discoloration in regard to the base of the wound that does not look as healthy as I was on the lives life. He does have erythema surrounding but I'm not sure if this is just due to chronic venous stasis/lymphedema or if it truly is anything infection wise going on at this point. Nonetheless in general the patient seems to be doing fairly well he's not having too much pain although he does have some discomfort. No fevers, chills, nausea, or vomiting noted  at this time. Electronic Signature(s) Signed: 11/02/2017 1:32:29 PM By: Lenda Kelp PA-C Entered By: Lenda Kelp on 11/02/2017 13:29:08 [name] ([id number]) -------------------------------------------------------------------------------- Physical Exam Details Patient Name: [name]. Date of Service: 10/30/2017 10:30  AM Medical Record Number: [id number] Patient Account Number: [id number] Date of Birth/Sex: 06/28/1945 (72 y.o. M) Treating RN: Curtis Sites Primary Care Provider: Etheleen Nicks Other Clinician: Referring Provider: Referral, Self Treating Provider/Extender: STONE III, HOYT Weeks in Treatment: 0 Constitutional patient is hypertensive.. pulse regular and within target range for patient.Marland Kitchen respirations regular, non-labored and within target range for patient.Marland Kitchen temperature within target range for patient.. Obese and well-hydrated in no acute distress. Eyes conjunctiva clear no eyelid edema noted. pupils equal round and reactive to light and accommodation. Ears, Nose, Mouth, and Throat no gross abnormality of ear auricles or external auditory canals. normal hearing noted during conversation. mucus membranes moist. Respiratory normal breathing without difficulty. clear to auscultation bilaterally. Cardiovascular regular rate and rhythm with normal S1, S2. 2+ dorsalis pedis/posterior tibialis pulses. 1+ pitting edema of the bilateral lower extremities. Gastrointestinal (GI) soft, non-tender, non-distended, +BS. no ventral hernia noted. Musculoskeletal normal gait and posture. Psychiatric this patient is able to make decisions and demonstrates good insight into disease process. Alert and Oriented x 3. pleasant and cooperative. Notes At this point in regard to the patient's wound bed at this time he seems to be exhibiting some signs of necrotic tissue on the surface of the wound that is going to require sharp debridement. I did go ahead and recommend  debridement today which he tolerated well without complication. I was able to remove necrotic tissue from the surface the wound as well as dried actually date from around the edge of the wound freeing up those edges which should help with epithelialization and healing as well. We won general see were things stand at follow-up. Electronic Signature(s) Signed: 11/02/2017 1:32:29 PM By: Lenda Kelp PA-C Entered By: Lenda Kelp on 11/02/2017 13:30:53 Drennan, [name] ([id number]) -------------------------------------------------------------------------------- Physician Orders Details Patient Name: [name]. Date of Service: 10/30/2017 10:30 AM Medical Record Number: [id number] Patient Account Number: [id number] Date of Birth/Sex: 22-Apr-1945 (72 y.o. M) Treating RN: Curtis Sites Primary Care Provider: Etheleen Nicks Other Clinician: Referring Provider: Referral, Self Treating Provider/Extender: Linwood Dibbles, HOYT Weeks in Treatment: 0 Verbal / Phone Orders: No Diagnosis Coding ICD-10 Coding Code Description I87.2 Venous insufficiency (chronic) (peripheral) I89.0 Lymphedema, not elsewhere classified L97.312 Non-pressure chronic ulcer of right ankle with fat layer exposed E66.01 Morbid (severe) obesity due to excess calories Wound Cleansing Wound #6 Right,Lateral Malleolus o Clean wound with Normal Saline. o May Shower, gently pat wound dry prior to applying new dressing. Anesthetic (add to Medication List) Wound #6 Right,Lateral Malleolus o Topical Lidocaine 4% cream applied to wound bed prior to debridement (In Clinic Only). Primary Wound Dressing Wound #6 Right,Lateral Malleolus o Iodoflex Secondary Dressing Wound #6 Right,Lateral Malleolus o ABD pad o XtraSorb Dressing Change Frequency Wound #6 Right,Lateral Malleolus o Change dressing every week Follow-up Appointments Wound #6 Right,Lateral Malleolus o Other: - nurse visit on 11/03/17 and follow up  the following Tuesday Edema Control Wound #6 Right,Lateral Malleolus o 3 Layer Compression System - Right Lower Extremity Additional Orders / Instructions Wound #6 Right,Lateral Malleolus o Increase protein intake. MATTOX, SCHORR ([id number]) o Activity as tolerated Laboratory o Bacteria identified in Wound by Culture (MICRO) oooo LOINC Code: [id number] oooo Convenience Name: Wound culture routine Patient Medications Allergies: aspirin Notifications Medication Indication Start End Bactrim DS 10/30/2017 DOSE 1 - oral 800 mg-160 mg tablet - 1 tablet oral taken 2 times a day for 10 days Electronic Signature(s) Signed: 10/30/2017 11:49:31 AM By: Lenda Kelp PA-C Entered By: Lenda Kelp  on 10/30/2017 11:49:30 Patchell, Jimmy R. ([id number]) -------------------------------------------------------------------------------- Problem List Details Patient Name: ANTOLIN, BELSITO. Date of Service: 10/30/2017 10:30 AM Medical Record Number: [id number] Patient Account Number: [id number] Date of Birth/Sex: [birth year] (72 y.o. M) Treating RN: Curtis Sites Primary Care Provider: Etheleen Nicks Other Clinician: Referring Provider: Referral, Self Treating Provider/Extender: Linwood Dibbles, HOYT Weeks in Treatment: 0 Active Problems ICD-10 Evaluated Encounter Code Description Active Date Today Diagnosis I87.2 Venous insufficiency (chronic) (peripheral) 10/30/2017 No Yes I89.0 Lymphedema, not elsewhere classified 10/30/2017 No Yes L97.312 Non-pressure chronic ulcer of right ankle with fat layer 10/30/2017 No Yes exposed E66.01 Morbid (severe) obesity due to excess calories 10/30/2017 No Yes Inactive Problems Resolved Problems Electronic Signature(s) Signed: 11/02/2017 1:32:29 PM By: Lenda Kelp PA-C Entered By: Lenda Kelp on 10/30/2017 11:25:57 [name], [name] ([id number]) -------------------------------------------------------------------------------- Progress Note Details Patient  Name: [name]. Date of Service: 10/30/2017 10:30 AM Medical Record Number: [id number] Patient Account Number: [id number] Date of Birth/Sex: [birth year] (72 y.o. M) Treating RN: Curtis Sites Primary Care Provider: Etheleen Nicks Other Clinician: Referring Provider: Referral, Self Treating Provider/Extender: Linwood Dibbles, HOYT Weeks in Treatment: 0 Subjective Chief Complaint Information obtained from Patient Right lateral ankle ulcer History of Present Illness (HPI) The following HPI elements were documented for the patient's wound: Location: right lower extremity Quality: Patient has been experiencing much less pain currently Severity: 1 out of 10 Duration: Patient has had the wound for approximately 3 weeks prior to presenting for treatment Timing: Pain occurs most often with manipulation of the wound Context: Patient dropped a cardboard box which struck his leg causing the initial injury which hasn't healed since onset Modifying Factors: Currently patient has been using over-the-counter antibiotic ointment Associated Signs and Symptoms: Patient reports having difficulty standing for long periods. As noted above the patient had a injury due to working in the yard and this rapidly progressed to a ulcerated area with an infection. In October 2014 he did have some vascular studies done but never saw a Careers adviser and never had any surgery for varicose veins. His past medical history is significant for hypertension, obstructive sleep apnea, morbid obesity, stasis dermatitis of both lower extremities and his past surgical history is suggestive of hip surgery on the right, gastric bypass surgery, cholecystectomy, hip replacement. He has recently been put on Levaquin 500 milligrams daily, Bactrim DS 1 twice a day and Bactroban 2% topical ointment locally. No recent labs or vascular or imaging studies done. 07/11/2014 a venous duplex study was done on 07/05/2014 -- it showed no evidence of  deep vein thrombosis or superficial thrombosis bilaterally. 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 The patient has an appointment to see the vascular surgeons on June 20. 07/18/2014 his appointment with the vascular surgeons is rescheduled for this afternoon. 07/25/2014 -- he saw the vascular surgeon and is going to have a endovenous procedure done on July 29. 08/01/2014 -- he was diagnosed with a UTI and is on ciprofloxacin for 10 days. readmission: 11/27/15 On evaluation today patient is having discomfort in the right ankle region following an open wound from having dropped a cardboard box striking his ankle. He tells me that this has not healed despite many of the measures that he learned from previous injuries when he was seen at our wound center. Therefore he didn't come in for reevaluation today. He has not noted any significant purulent discharge at this point in time. 12/04/15 patient appears to  be doing somewhat better at this point in time today in regard to his ankle wound. it is measuring smaller and looking cleaner. 12/11/15; patient wound continues to look improve this is on the right lateral ankle. Debrided of surface slough and nonviable subcutaneous tissue. Using silver alginate 12-18-15 [name] presents today with his wife for follow-up on right lower extremity venous ulcer. He denies any changes or complications over the past week. He is tolerating compression therapy to his right lower extremity and has a properly Kersting, Standley R. ([id number]) fitting compression hose to the left lower extremity. 12/25/15; patient with a wound on his right lateral lower extremity in the setting of severe venous insufficiency [originally trauma against the box]. His edema is well controlled he is using Prisma. 01/01/16; continued improvement using Prisma. He has severe venous insufficiency however the wound was  originally trauma against a box. 01/08/16; his wound is totally epithelialized. We will give him measurements for graded pressure stockings which I think he is going to get at elastic therapy in [facility] Readmission: 10/30/17 patient presents today for reevaluation in our clinic although it has been since 2017 that we've last seen him. He has a ulcer on the right lateral ankle which he tells me began roughly 2 months ago. He states that he had one similar on the left ankle although this completely healed without any complication. However the one on the right as continue to give him a lot of trouble and is not healing appropriately. He really was not sure what the best thing to put on the area would be and therefore he's just been putting advantage to cover it. Another treatment has been utilized at this time. Fortunately there is not appear to be any evidence of significant infection necessarily although there is some evidence of abnormal discoloration in regard to the base of the wound that does not look as healthy as I was on the lives life. He does have erythema surrounding but I'm not sure if this is just due to chronic venous stasis/lymphedema or if it truly is anything infection wise going on at this point. Nonetheless in general the patient seems to be doing fairly well he's not having too much pain although he does have some discomfort. No fevers, chills, nausea, or vomiting noted at this time. Wound History Patient presents with 1 open wound that has been present for approximately 1 month +. Patient has been treating wound in the following manner: bandaid. Laboratory tests have not been performed in the last month. Patient reportedly has not tested positive for an antibiotic resistant organism. Patient reportedly has not tested positive for osteomyelitis. Patient reportedly has not had testing performed to evaluate circulation in the legs. Patient History Information  obtained from Patient. Allergies aspirin Family History Cancer - Mother, Diabetes - Mother, Heart Disease - Maternal Grandparents, No family history of Hereditary Spherocytosis, Hypertension, Kidney Disease, Lung Disease, Seizures, Stroke, Thyroid Problems, Tuberculosis. Social History Former smoker, Marital Status - Married, Alcohol Use - Rarely, Drug Use - No History, Caffeine Use - Moderate. Medical History Musculoskeletal Patient has history of Osteoarthritis - left hip Hospitalization/Surgery History - 06/28/2003, [place], cellulitis. - 07/27/2005, [facility] [place], cellulitis. - 07/28/2003, [facility], hip replacement. - 06/30/2014, [facility], cellulitis. 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 Denies complaints or symptoms of Dry Eyes, Vision Changes. Ear/Nose/Mouth/Throat Denies  complaints or symptoms of Difficult clearing ears. JAMAURION, SLEMMER ([id number]) Hematologic/Lymphatic Denies complaints or symptoms of Bleeding / Clotting Disorders, Human Immunodeficiency Virus. Respiratory Denies complaints or symptoms of Chronic or frequent coughs, Shortness of Breath. Cardiovascular Denies complaints or symptoms of Chest pain, LE edema. Gastrointestinal Denies complaints or symptoms of Frequent diarrhea, Nausea, Vomiting. Endocrine Denies complaints or symptoms of Hepatitis, Thyroid disease, Polydypsia (Excessive Thirst). Genitourinary Denies complaints or symptoms of Kidney failure/ Dialysis, Incontinence/dribbling, supra pubic catheter in place due to retention Immunological Denies complaints or symptoms of Hives, Itching. Integumentary (Skin) Complains or has symptoms of Wounds. Denies complaints or symptoms of Bleeding or bruising tendency, Breakdown, Swelling. Musculoskeletal Denies complaints or symptoms of Muscle Pain, Muscle  Weakness. Neurologic Denies complaints or symptoms of Numbness/parasthesias, Focal/Weakness. Oncologic The patient has no complaints or symptoms. Psychiatric Denies complaints or symptoms of Anxiety, Claustrophobia. Objective Constitutional patient is hypertensive.. pulse regular and within target range for patient.Marland Kitchen respirations regular, non-labored and within target range for patient.Marland Kitchen temperature within target range for patient.. Obese and well-hydrated in no acute distress. Vitals Time Taken: 10:40 AM, Temperature: 98.3 F, Pulse: 68 bpm, Respiratory Rate: 18 breaths/min, Blood Pressure: 138/66 mmHg. Eyes conjunctiva clear no eyelid edema noted. pupils equal round and reactive to light and accommodation. Ears, Nose, Mouth, and Throat no gross abnormality of ear auricles or external auditory canals. normal hearing noted during conversation. mucus membranes moist. Respiratory normal breathing without difficulty. clear to auscultation bilaterally. Cardiovascular regular rate and rhythm with normal S1, S2. 2+ dorsalis pedis/posterior tibialis pulses. 1+ pitting edema of the bilateral lower extremities. [name], [name] ([id number]) Gastrointestinal (GI) soft, non-tender, non-distended, +BS. no ventral hernia noted. Musculoskeletal normal gait and posture. Psychiatric this patient is able to make decisions and demonstrates good insight into disease process. Alert and Oriented x 3. pleasant and cooperative. General Notes: At this point in regard to the patient's wound bed at this time he seems to be exhibiting some signs of necrotic tissue on the surface of the wound that is going to require sharp debridement. I did go ahead and recommend debridement today which he tolerated well without complication. I was able to remove necrotic tissue from the surface the wound as well as dried actually date from around the edge of the wound freeing up those edges which should help with  epithelialization and healing as well. We won general see were things stand at follow-up. Integumentary (Hair, Skin) Wound #6 status is Open. Original cause of wound was Gradually Appeared. The wound is located on the Right,Lateral Malleolus. The wound measures 2.8cm length x 2cm width x 0.2cm depth; 4.398cm^2 area and 0.88cm^3 volume. There is Fat Layer (Subcutaneous Tissue) Exposed exposed. There is no tunneling or undermining noted. There is a small amount of serosanguineous drainage noted. The wound margin is flat and intact. There is medium (34-66%) red, pink granulation within the wound bed. There is a medium (34-66%) amount of necrotic tissue within the wound bed including Adherent Slough. The periwound skin appearance exhibited: Dry/Scaly, Ecchymosis. The periwound skin appearance did not exhibit: Callus, Crepitus, Excoriation, Induration, Rash, Scarring, Maceration, Atrophie Blanche, Cyanosis, Hemosiderin Staining, Mottled, Pallor, Rubor, Erythema. Periwound temperature was noted as No Abnormality. Assessment Active Problems ICD-10 Venous insufficiency (chronic) (peripheral) Lymphedema, not elsewhere classified Non-pressure chronic ulcer of right ankle with fat layer exposed Morbid (severe) obesity due to excess calories Procedures Wound #6 Pre-procedure diagnosis of Wound #6 is a Lymphedema located on the Right,Lateral Malleolus . There was a Excisional Skin/Subcutaneous  Tissue Debridement with a total area of 5.6 sq cm performed by STONE III, HOYT E., PA-C. With the following instrument(s): Curette to remove Viable and Non-Viable tissue/material. Material removed includes Subcutaneous Tissue, Slough, Skin: Dermis, Skin: Epidermis, and Fibrin/Exudate after achieving pain control using Lidocaine 4% Topical Solution. 1 specimen was taken by a Swab and sent to the lab per facility protocol. A time out was conducted at 11:32, prior to the start of the procedure. A Minimum amount of  bleeding was controlled with Pressure. The procedure was tolerated well with a pain level of 0 throughout and a pain level of 0 following the procedure. Post Debridement Measurements: 2.8cm length x 2cm width x 0.3cm depth; 1.319cm^3 volume. Character of Wound/Ulcer Post Debridement is improved. Post procedure Diagnosis Wound #6: Same as Pre-Procedure Weisensel, Rilee R. ([id number]) Plan Wound Cleansing: Wound #6 Right,Lateral Malleolus: Clean wound with Normal Saline. May Shower, gently pat wound dry prior to applying new dressing. Anesthetic (add to Medication List): Wound #6 Right,Lateral Malleolus: Topical Lidocaine 4% cream applied to wound bed prior to debridement (In Clinic Only). Primary Wound Dressing: Wound #6 Right,Lateral Malleolus: Iodoflex Secondary Dressing: Wound #6 Right,Lateral Malleolus: ABD pad XtraSorb Dressing Change Frequency: Wound #6 Right,Lateral Malleolus: Change dressing every week Follow-up Appointments: Wound #6 Right,Lateral Malleolus: Other: - nurse visit on 11/03/17 and follow up the following Tuesday Edema Control: Wound #6 Right,Lateral Malleolus: 3 Layer Compression System - Right Lower Extremity Additional Orders / Instructions: Wound #6 Right,Lateral Malleolus: Increase protein intake. Activity as tolerated Laboratory ordered were: Wound culture routine The following medication(s) was prescribed: Bactrim DS oral 800 mg-160 mg tablet 1 1 tablet oral taken 2 times a day for 10 days starting 10/30/2017 At this time my suggestion is gonna be that we initiate the above wound care measures for the next week. The patient is in agreement the plan. We will subsequently see were things stand at follow-up. If anything changes or worsens in the interim the patient will let me know. Otherwise I am going to go ahead and place him on an antibiotic, Bactrim DS, over the next 10 days. We will see were the culture shows and if we need to make any changes or  adjustments we will otherwise I'm hopeful that he'll continue to show signs of good improvement. Please see above for specific wound care orders. We will see patient for re-evaluation in 2 week(s) here in the clinic. If anything worsens or changes patient will contact our office for additional recommendations. Electronic Signature(s) Signed: 11/02/2017 1:32:29 PM By: Lenda Kelp PA-C Entered By: Lenda Kelp on 11/02/2017 13:31:19 [name], [name] ([id number]) [name], [name] ([id number]) -------------------------------------------------------------------------------- ROS/PFSH Details Patient Name: [name]. Date of Service: 10/30/2017 10:30 AM Medical Record Number: [id number] Patient Account Number: [id number] Date of Birth/Sex: May 07, 1945 (71 y.o. M) Treating RN: Renne Crigler Primary Care Provider: Etheleen Nicks Other Clinician: Referring Provider: Referral, Self Treating Provider/Extender: Linwood Dibbles, HOYT Weeks in Treatment: 0 Information Obtained From Patient Wound History Do you currently have one or more open woundso Yes How many open wounds do you currently haveo 1 Approximately how long have you had your woundso 1 month + How have you been treating your wound(s) until nowo bandaid Has your wound(s) ever healed and then re-openedo No Have you had any lab work done in the past montho No Have you tested positive for an antibiotic resistant organism (MRSA, VRE)o No Have you tested positive for osteomyelitis (bone infection)o No Have you had  any tests for circulation on your legso No Eyes Complaints and Symptoms: Positive for: Glasses / Contacts - glasses Negative for: Dry Eyes; Vision Changes Medical History: Negative for: Cataracts; Glaucoma; Optic Neuritis Ear/Nose/Mouth/Throat Complaints and Symptoms: Negative for: Difficult clearing ears Medical History: Negative for: Chronic sinus problems/congestion; Middle ear  problems Hematologic/Lymphatic Complaints and Symptoms: Negative for: Bleeding / Clotting Disorders; Human Immunodeficiency Virus Medical History: Positive for: Anemia - iron pills Negative for: Hemophilia; Human Immunodeficiency Virus; Lymphedema; Sickle Cell Disease Respiratory Complaints and Symptoms: Negative for: Chronic or frequent coughs; Shortness of Breath Medical History: Positive for: Sleep Apnea - C-pap and oxygen 1.5L at night Negative for: Aspiration; Asthma; Chronic Obstructive Pulmonary Disease (COPD); Pneumothorax; Tuberculosis Cardiovascular [name], [name] ([id number]) Complaints and Symptoms: Negative for: Chest pain; LE edema Medical History: Positive for: Hypertension Negative for: Angina; Arrhythmia; Congestive Heart Failure; Coronary Artery Disease; Deep Vein Thrombosis; Hypotension; Myocardial Infarction; Peripheral Arterial Disease; Peripheral Venous Disease; Phlebitis; Vasculitis Gastrointestinal Complaints and Symptoms: Negative for: Frequent diarrhea; Nausea; Vomiting Medical History: Negative for: Cirrhosis ; Colitis; Crohnos; Hepatitis A; Hepatitis B; Hepatitis C Endocrine Complaints and Symptoms: Negative for: Hepatitis; Thyroid disease; Polydypsia (Excessive Thirst) Medical History: Negative for: Type I Diabetes; Type II Diabetes Genitourinary Complaints and Symptoms: Negative for: Kidney failure/ Dialysis; Incontinence/dribbling Review of System Notes: supra pubic catheter in place due to retention Medical History: Negative for: End Stage Renal Disease Immunological Complaints and Symptoms: Negative for: Hives; Itching Medical History: Negative for: Lupus Erythematosus; Raynaudos; Scleroderma Integumentary (Skin) Complaints and Symptoms: Positive for: Wounds Negative for: Bleeding or bruising tendency; Breakdown; Swelling Medical History: Negative for: History of Burn; History of pressure wounds Musculoskeletal Complaints and  Symptoms: Negative for: Muscle Pain; Muscle Weakness Medical History: Positive for: Osteoarthritis - left hip Negative for: Gout; Rheumatoid Arthritis; Osteomyelitis Past Medical History Notes: [name], [name] ([id number]) has artificial right hip and right shoulder Neurologic Complaints and Symptoms: Negative for: Numbness/parasthesias; Focal/Weakness Medical History: Negative for: Dementia; Neuropathy; Quadriplegia; Paraplegia; Seizure Disorder Psychiatric Complaints and Symptoms: Negative for: Anxiety; Claustrophobia Medical History: Negative for: Anorexia/bulimia; Confinement Anxiety Oncologic Complaints and Symptoms: No Complaints or Symptoms Medical History: Negative for: Received Chemotherapy; Received Radiation Immunizations Pneumococcal Vaccine: Received Pneumococcal Vaccination: No Tetanus Vaccine: Last tetanus shot: 06/28/2011 Implantable Devices Hospitalization / Surgery History Name of Hospital Purpose of Hospitalization/Surgery Date [place] cellulitis 06/28/2003 [facility] [place] cellulitis 07/27/2005 [facility] hip replacement 07/28/2003 [facility] ED cellulitis 06/30/2014 Family and Social History Cancer: Yes - Mother; Diabetes: Yes - Mother; Heart Disease: Yes - Maternal Grandparents; Hereditary Spherocytosis: No; Hypertension: No; Kidney Disease: No; Lung Disease: No; Seizures: No; Stroke: No; Thyroid Problems: No; Tuberculosis: No; Former smoker; Marital Status - Married; Alcohol Use: Rarely; Drug Use: No History; Caffeine Use: Moderate; Financial Concerns: No; Food, Clothing or Shelter Needs: No; Support System Lacking: No; Transportation Concerns: No; Advanced Directives: Yes (Not Provided); Patient does not want information on Advanced Directives; Living Will: Yes (Not Provided); Medical Power of Attorney: Yes (Not Provided) Electronic Signature(s) Signed: 10/30/2017 3:59:43 PM By: Renne Crigler Signed: 11/02/2017 1:32:29 PM By: Lenda Kelp PA-C Entered By: Renne Crigler on 10/30/2017 10:57:45 [name], [name] ([id number]) -------------------------------------------------------------------------------- SuperBill Details Patient Name: [name]. Date of Service: 10/30/2017 Medical Record Number: [id number] Patient Account Number: [id number] Date of Birth/Sex: 1946/01/17 (72 y.o. M) Treating RN: Curtis Sites Primary Care Provider: Etheleen Nicks Other Clinician: Referring Provider: Referral, Self Treating Provider/Extender: STONE III, HOYT Weeks in Treatment: 0 Diagnosis Coding ICD-10 Codes Code Description I87.2 Venous insufficiency (  chronic) (peripheral) I89.0 Lymphedema, not elsewhere classified L97.312 Non-pressure chronic ulcer of right ankle with fat layer exposed E66.01 Morbid (severe) obesity due to excess calories Facility Procedures CPT4 Code: 32440102 Description: 99213 - WOUND CARE VISIT-LEV 3 EST PT Modifier: Quantity: 1 CPT4 Code: 72536644 Description: 11042 - DEB SUBQ TISSUE 20 SQ CM/< ICD-10 Diagnosis Description L97.312 Non-pressure chronic ulcer of right ankle with fat layer ex Modifier: posed Quantity: 1 Physician Procedures CPT4 Code: 0347425 Description: 99214 - WC PHYS LEVEL 4 - EST PT ICD-10 Diagnosis Description I87.2 Venous insufficiency (chronic) (peripheral) I89.0 Lymphedema, not elsewhere classified L97.312 Non-pressure chronic ulcer of right ankle with fat layer ex E66.01 Morbid  (severe) obesity due to excess calories Modifier: 25 posed Quantity: 1 CPT4 Code: [id number] Description: 11042 - WC PHYS SUBQ TISS 20 SQ CM ICD-10 Diagnosis Description L97.312 Non-pressure chronic ulcer of right ankle with fat layer ex Modifier: posed Quantity: 1 Electronic Signature(s) Signed: 11/02/2017 1:32:29 PM By: Lenda Kelp PA-C Entered By: Lenda Kelp on 11/02/2017 13:31:56

## 2017-11-04 NOTE — Progress Notes (Signed)
PHILLP, DOLORES ([id number]) Visit Report for 10/30/2017 Allergy List Details Patient Name: [name], [name]. Date of Service: 10/30/2017 10:30 AM Medical Record Number: [id number] Patient Account Number: [id number] Date of Birth/Sex: 30-Jan-1945 (72 y.o. M) Treating RN: Renne Crigler Primary Care Brittnae Aschenbrenner: Etheleen Nicks Other Clinician: Referring Cecillia Menees: Referral, Self Treating Kalisa Girtman/Extender: STONE III, HOYT Weeks in Treatment: 0 Allergies Active Allergies aspirin Allergy Notes Electronic Signature(s) Signed: 10/30/2017 3:59:43 PM By: Renne Crigler Entered By: Renne Crigler on 10/30/2017 10:51:48 Faughn, Jorje Guild ([id number]) -------------------------------------------------------------------------------- Arrival Information Details Patient Name: [name] Date of Service: 10/30/2017 10:30 AM Medical Record Number: [id number] Patient Account Number: [id number] Date of Birth/Sex: [birth year] (72 y.o. M) Treating RN: Curtis Sites Primary Care Yania Bogie: Etheleen Nicks Other Clinician: Referring Estanislao Harmon: Referral, Self Treating Roisin Mones/Extender: Linwood Dibbles, HOYT Weeks in Treatment: 0 Visit Information Patient Arrived: Cane Arrival Time: 10:41 Accompanied By: wife Transfer Assistance: None Patient Identification Verified: Yes Secondary Verification Process Yes Completed: Patient Has Alerts: Yes Patient Alerts: Hgb a1c 5.3 (4-27- 19) History Since Last Visit Added or deleted any medications: No Any new allergies or adverse reactions: No Had a fall or experienced change in activities of daily living that may affect risk of falls: No Signs or symptoms of abuse/neglect since last visito No Hospitalized since last visit: No Implantable device outside of the clinic excluding cellular tissue based products placed in the center since last visit: No Electronic Signature(s) Signed: 10/30/2017 3:59:43 PM By: Renne Crigler Entered By: Renne Crigler on 10/30/2017  11:02:14 Cly, Jorje Guild ([id number]) -------------------------------------------------------------------------------- Clinic Level of Care Assessment Details Patient Name: [name] Date of Service: 10/30/2017 10:30 AM Medical Record Number: [id number] Patient Account Number: [id number] Date of Birth/Sex: 1945/04/19 (72 y.o. M) Treating RN: Curtis Sites Primary Care Gudrun Axe: Etheleen Nicks Other Clinician: Referring Jaquarius Seder: Referral, Self Treating Darneshia Demary/Extender: Linwood Dibbles, HOYT Weeks in Treatment: 0 Clinic Level of Care Assessment Items TOOL 1 Quantity Score []  - Use when EandM and Procedure is performed on INITIAL visit 0 ASSESSMENTS - Nursing Assessment / Reassessment X - General Physical Exam (combine w/ comprehensive assessment (listed just below) when 1 20 performed on new pt. evals) X- 1 25 Comprehensive Assessment (HX, ROS, Risk Assessments, Wounds Hx, etc.) ASSESSMENTS - Wound and Skin Assessment / Reassessment []  - Dermatologic / Skin Assessment (not related to wound area) 0 ASSESSMENTS - Ostomy and/or Continence Assessment and Care []  - Incontinence Assessment and Management 0 []  - 0 Ostomy Care Assessment and Management (repouching, etc.) PROCESS - Coordination of Care X - Simple Patient / Family Education for ongoing care 1 15 []  - 0 Complex (extensive) Patient / Family Education for ongoing care X- 1 10 Staff obtains Chiropractor, Records, Test Results / Process Orders []  - 0 Staff telephones HHA, Nursing Homes / Clarify orders / etc []  - 0 Routine Transfer to another Facility (non-emergent condition) []  - 0 Routine Hospital Admission (non-emergent condition) X- 1 15 New Admissions / Manufacturing engineer / Ordering NPWT, Apligraf, etc. []  - 0 Emergency Hospital Admission (emergent condition) PROCESS - Special Needs []  - Pediatric / Minor Patient Management 0 []  - 0 Isolation Patient Management []  - 0 Hearing / Language / Visual special  needs []  - 0 Assessment of Community assistance (transportation, D/C planning, etc.) []  - 0 Additional assistance / Altered mentation []  - 0 Support Surface(s) Assessment (bed, cushion, seat, etc.) Bartoletti, Ayomide R. ([id number]) INTERVENTIONS - Miscellaneous []  - External ear exam 0 []  - 0 Patient Transfer (multiple staff / Michiel Sites  Lift / Similar devices) []  - 0 Simple Staple / Suture removal (25 or less) []  - 0 Complex Staple / Suture removal (26 or more) []  - 0 Hypo/Hyperglycemic Management (do not check if billed separately) X- 1 15 Ankle / Brachial Index (ABI) - do not check if billed separately Has the patient been seen at the hospital within the last three years: Yes Total Score: 100 Level Of Care: New/Established - Level 3 Electronic Signature(s) Signed: 10/30/2017 5:05:51 PM By: Curtis Sites Entered By: Curtis Sites on 10/30/2017 12:08:18 [name] ([id number]) -------------------------------------------------------------------------------- Encounter Discharge Information Details Patient Name: [name] Date of Service: 10/30/2017 10:30 AM Medical Record Number: [id number] Patient Account Number: [id number] Date of Birth/Sex: 10-05-1945 (72 y.o. M) Treating RN: Renne Crigler Primary Care Keenya Matera: Etheleen Nicks Other Clinician: Referring Tamla Winkels: Referral, Self Treating Jazzman Loughmiller/Extender: Linwood Dibbles, HOYT Weeks in Treatment: 0 Encounter Discharge Information Items Discharge Condition: Stable Ambulatory Status: Cane Discharge Destination: Home Transportation: Private Auto Accompanied By: wife Schedule Follow-up Appointment: Yes Clinical Summary of Care: Post Procedure Vitals: Temperature (F): 98.3 Pulse (bpm): 68 Respiratory Rate (breaths/min): 18 Blood Pressure (mmHg): 138/66 Electronic Signature(s) Signed: 10/30/2017 12:01:04 PM By: Renne Crigler Entered By: Renne Crigler on 10/30/2017 12:01:04 Wilton, Jorje Guild  ([id number]) -------------------------------------------------------------------------------- Lower Extremity Assessment Details Patient Name: [name] Date of Service: 10/30/2017 10:30 AM Medical Record Number: [id number] Patient Account Number: [id number] Date of Birth/Sex: [birth year] (72 y.o. M) Treating RN: Renne Crigler Primary Care Magenta Schmiesing: Etheleen Nicks Other Clinician: Referring Cadan Maggart: Referral, Self Treating Cory Kitt/Extender: Linwood Dibbles, HOYT Weeks in Treatment: 0 Edema Assessment Assessed: [Left: No] [Right: No] Edema: [Left: Yes] [Right: Yes] Calf Left: Right: Point of Measurement: 35 cm From Medial Instep 41.5 cm 42 cm Ankle Left: Right: Point of Measurement: 10 cm From Medial Instep 25.5 cm 26 cm Vascular Assessment Claudication: Claudication Assessment [Left:None] [Right:None] Pulses: Dorsalis Pedis Palpable: [Left:Yes] [Right:Yes] Doppler Audible: [Left:Yes] [Right:Yes] Posterior Tibial Extremity colors, hair growth, and conditions: Extremity Color: [Left:Hyperpigmented] [Right:Hyperpigmented] Hair Growth on Extremity: [Left:Yes] [Right:Yes] Temperature of Extremity: [Left:Warm] [Right:Warm] Capillary Refill: [Left:< 3 seconds] [Right:< 3 seconds] Blood Pressure: Brachial: [Left:132] [Right:138] Dorsalis Pedis: 170 [Left:Dorsalis Pedis: 180] Ankle: Posterior Tibial: 178 [Left:Posterior Tibial: 180 1.29] [Right:1.30] Toe Nail Assessment Left: Right: Thick: Yes Yes Discolored: Yes Yes Deformed: Yes Yes Improper Length and Hygiene: Yes Yes Electronic Signature(s) Signed: 10/30/2017 3:59:43 PM By: Renne Crigler Entered By: Renne Crigler on 10/30/2017 11:12:17 Jobson, Jorje Guild ([id number]) Rosch, Tonya R. ([id number]) -------------------------------------------------------------------------------- Multi Wound Chart Details Patient Name: [name] Date of Service: 10/30/2017 10:30 AM Medical Record Number: [id number] Patient  Account Number: [id number] Date of Birth/Sex: 1945/10/29 (72 y.o. M) Treating RN: Curtis Sites Primary Care Yaniel Limbaugh: Etheleen Nicks Other Clinician: Referring Nareh Matzke: Referral, Self Treating Amea Mcphail/Extender: STONE III, HOYT Weeks in Treatment: 0 Vital Signs Height(in): Pulse(bpm): 68 Weight(lbs): Blood Pressure(mmHg): 138/66 Body Mass Index(BMI): Temperature(F): 98.3 Respiratory Rate 18 (breaths/min): Photos: [6:No Photos] [N/A:N/A] Wound Location: [6:Right Malleolus - Lateral] [N/A:N/A] Wounding Event: [6:Gradually Appeared] [N/A:N/A] Primary Etiology: [6:Lymphedema] [N/A:N/A] Comorbid History: [6:Anemia, Sleep Apnea, Hypertension, Osteoarthritis] [N/A:N/A] Date Acquired: [6:09/27/2017] [N/A:N/A] Weeks of Treatment: [6:0] [N/A:N/A] Wound Status: [6:Open] [N/A:N/A] Measurements L x W x D [6:2.8x2x0.2] [N/A:N/A] (cm) Area (cm) : [6:4.398] [N/A:N/A] Volume (cm) : [6:0.88] [N/A:N/A] Classification: [6:Full Thickness Without Exposed Support Structures] [N/A:N/A] Exudate Amount: [6:Small] [N/A:N/A] Exudate Type: [6:Serosanguineous] [N/A:N/A] Exudate Color: [6:red, brown] [N/A:N/A] Wound Margin: [6:Flat and Intact] [N/A:N/A] Granulation Amount: [6:Medium (34-66%)] [N/A:N/A] Granulation Quality: [6:Red, Pink] [N/A:N/A] Necrotic Amount: [6:Medium (34-66%)] [  N/A:N/A] Exposed Structures: [6:Fat Layer (Subcutaneous Tissue) Exposed: Yes Fascia: No Tendon: No Muscle: No Joint: No Bone: No] [N/A:N/A] Epithelialization: [6:None] [N/A:N/A] Periwound Skin Texture: [6:Excoriation: No Induration: No Callus: No Crepitus: No Rash: No Scarring: No] [N/A:N/A] Periwound Skin Moisture: [N/A:N/A] Dry/Scaly: Yes Maceration: No Periwound Skin Color: Ecchymosis: Yes N/A N/A Atrophie Blanche: No Cyanosis: No Erythema: No Hemosiderin Staining: No Mottled: No Pallor: No Rubor: No Temperature: No Abnormality N/A N/A Tenderness on Palpation: No N/A N/A Wound Preparation: Ulcer  Cleansing: N/A N/A Rinsed/Irrigated with Saline Topical Anesthetic Applied: Other: lidocaine 4% Treatment Notes Electronic Signature(s) Signed: 10/30/2017 5:05:51 PM By: Curtis Sites Entered By: Curtis Sites on 10/30/2017 11:31:42 Riepe, Jorje Guild ([id number]) -------------------------------------------------------------------------------- Multi-Disciplinary Care Plan Details Patient Name: [name] Date of Service: 10/30/2017 10:30 AM Medical Record Number: [id number] Patient Account Number: [id number] Date of Birth/Sex: [birth year] (72 y.o. M) Treating RN: Curtis Sites Primary Care Samaya Boardley: Etheleen Nicks Other Clinician: Referring Vika Buske: Referral, Self Treating Kerston Landeck/Extender: Linwood Dibbles, HOYT Weeks in Treatment: 0 Active Inactive ` Abuse / Safety / Falls / Self Care Management Nursing Diagnoses: Impaired physical mobility Goals: Patient will not develop complications from immobility Date Initiated: 10/30/2017 Target Resolution Date: 01/22/2018 Goal Status: Active Interventions: Assess fall risk on admission and as needed Notes: ` Orientation to the Wound Care Program Nursing Diagnoses: Knowledge deficit related to the wound healing center program Goals: Patient/caregiver will verbalize understanding of the Wound Healing Center Program Date Initiated: 10/30/2017 Target Resolution Date: 01/22/2018 Goal Status: Active Interventions: Provide education on orientation to the wound center Notes: ` Venous Leg Ulcer Nursing Diagnoses: Potential for venous Insuffiency (use before diagnosis confirmed) Goals: Patient will maintain optimal edema control Date Initiated: 10/30/2017 Target Resolution Date: 01/22/2018 Goal Status: Active Interventions: LUISFELIPE, ENGELSTAD R. ([id number]) Assess peripheral edema status every visit. Compression as ordered Notes: ` Wound/Skin Impairment Nursing Diagnoses: Impaired tissue integrity Goals: Ulcer/skin breakdown will  heal within 14 weeks Date Initiated: 10/30/2017 Target Resolution Date: 01/22/2018 Goal Status: Active Interventions: Assess patient/caregiver ability to obtain necessary supplies Assess patient/caregiver ability to perform ulcer/skin care regimen upon admission and as needed Assess ulceration(s) every visit Notes: Electronic Signature(s) Signed: 10/30/2017 5:05:51 PM By: Curtis Sites Entered By: Curtis Sites on 10/30/2017 11:31:16 Swager, Jorje Guild ([id number]) -------------------------------------------------------------------------------- Pain Assessment Details Patient Name: [name] Date of Service: 10/30/2017 10:30 AM Medical Record Number: [id number] Patient Account Number: [id number] Date of Birth/Sex: 1945-07-17 (72 y.o. M) Treating RN: Curtis Sites Primary Care Arelene Moroni: Etheleen Nicks Other Clinician: Referring Kailany Dinunzio: Referral, Self Treating Bricyn Labrada/Extender: Linwood Dibbles, HOYT Weeks in Treatment: 0 Active Problems Location of Pain Severity and Description of Pain Patient Has Paino No Site Locations Pain Management and Medication Current Pain Management: Electronic Signature(s) Signed: 10/30/2017 4:03:51 PM By: Dayton Martes RCP, RRT, CHT Signed: 10/30/2017 5:05:51 PM By: Curtis Sites Entered By: Dayton Martes on 10/30/2017 10:42:41 Eller, Jorje Guild ([id number]) -------------------------------------------------------------------------------- Patient/Caregiver Education Details Patient Name: [name] Date of Service: 10/30/2017 10:30 AM Medical Record Number: [id number] Patient Account Number: [id number] Date of Birth/Gender: 09-20-1945 (72 y.o. M) Treating RN: Curtis Sites Primary Care Physician: Etheleen Nicks Other Clinician: Referring Physician: Referral, Self Treating Physician/Extender: Skeet Simmer in Treatment: 0 Education Assessment Education Provided To: Patient and Caregiver Education Topics  Provided Venous: Handouts: Other: wrap precautions Methods: Explain/Verbal Responses: State content correctly Electronic Signature(s) Signed: 10/30/2017 3:59:43 PM By: Renne Crigler Entered By: Renne Crigler on 10/30/2017 12:01:09 Susman, Jorje Guild ([id number]) -------------------------------------------------------------------------------- Wound Assessment Details Patient Name: [name],  Amedeo R. Date of Service: 10/30/2017 10:30 AM Medical Record Number: [id number] Patient Account Number: [id number] Date of Birth/Sex: July 07, 1945 (72 y.o. M) Treating RN: Renne Crigler Primary Care Zuly Belkin: Etheleen Nicks Other Clinician: Referring Keland Peyton: Referral, Self Treating Jaysiah Marchetta/Extender: STONE III, HOYT Weeks in Treatment: 0 Wound Status Wound Number: 6 Primary Lymphedema Etiology: Wound Location: Right Malleolus - Lateral Wound Status: Open Wounding Event: Gradually Appeared Comorbid Anemia, Sleep Apnea, Hypertension, Date Acquired: 09/27/2017 History: Osteoarthritis Weeks Of Treatment: 0 Clustered Wound: No Photos Photo Uploaded By: Renne Crigler on 10/30/2017 15:56:00 Wound Measurements Length: (cm) 2.8 Width: (cm) 2 Depth: (cm) 0.2 Area: (cm) 4.398 Volume: (cm) 0.88 % Reduction in Area: % Reduction in Volume: Epithelialization: None Tunneling: No Undermining: No Wound Description Full Thickness Without Exposed Support Classification: Structures Wound Margin: Flat and Intact Exudate Small Amount: Exudate Type: Serosanguineous Exudate Color: red, brown Foul Odor After Cleansing: No Slough/Fibrino Yes Wound Bed Granulation Amount: Medium (34-66%) Exposed Structure Granulation Quality: Red, Pink Fascia Exposed: No Necrotic Amount: Medium (34-66%) Fat Layer (Subcutaneous Tissue) Exposed: Yes Necrotic Quality: Adherent Slough Tendon Exposed: No Muscle Exposed: No Joint Exposed: No Bone Exposed: No Mataya, Damek R. ([id number]) Periwound Skin  Texture Texture Color No Abnormalities Noted: No No Abnormalities Noted: No Callus: No Atrophie Blanche: No Crepitus: No Cyanosis: No Excoriation: No Ecchymosis: Yes Induration: No Erythema: No Rash: No Hemosiderin Staining: No Scarring: No Mottled: No Pallor: No Moisture Rubor: No No Abnormalities Noted: No Dry / Scaly: Yes Temperature / Pain Maceration: No Temperature: No Abnormality Wound Preparation Ulcer Cleansing: Rinsed/Irrigated with Saline Topical Anesthetic Applied: Other: lidocaine 4%, Treatment Notes Wound #6 (Right, Lateral Malleolus) 1. Cleansed with: Clean wound with Normal Saline 2. Anesthetic Topical Lidocaine 4% cream to wound bed prior to debridement 4. Dressing Applied: Iodoflex 7. Secured with 3 Layer Compression System - Right Lower Extremity Notes xtrasorb, unna to Ecologist) Signed: 10/30/2017 3:59:43 PM By: Renne Crigler Entered By: Renne Crigler on 10/30/2017 11:07:37 Minchey, Jorje Guild ([id number]) -------------------------------------------------------------------------------- Vitals Details Patient Name: [name] Date of Service: 10/30/2017 10:30 AM Medical Record Number: [id number] Patient Account Number: [id number] Date of Birth/Sex: 08/12/1945 (72 y.o. M) Treating RN: Curtis Sites Primary Care Mansoor Hillyard: Etheleen Nicks Other Clinician: Referring Willistine Ferrall: Referral, Self Treating Shaneika Rossa/Extender: STONE III, HOYT Weeks in Treatment: 0 Vital Signs Time Taken: 10:40 Temperature (F): 98.3 Pulse (bpm): 68 Respiratory Rate (breaths/min): 18 Blood Pressure (mmHg): 138/66 Reference Range: 80 - 120 mg / dl Electronic Signature(s) Signed: 10/30/2017 4:03:51 PM By: Dayton Martes RCP, RRT, CHT Entered By: Dayton Martes on 10/30/2017 10:44:46

## 2017-11-05 NOTE — Progress Notes (Signed)
IOKEPA, GEFFRE ([id number]) Visit Report for 11/03/2017 Arrival Information Details Patient Name: NEVYN, BOSSMAN. Date of Service: 11/03/2017 11:15 AM Medical Record Number: [id number] Patient Account Number: [id number] Date of Birth/Sex: 13-Nov-1945 (72 y.o. M) Treating RN: Curtis Sites Primary Care Zacherie Honeyman: Etheleen Nicks Other Clinician: Referring Yulissa Needham: Etheleen Nicks Treating Edmond Ginsberg/Extender: Linwood Dibbles, HOYT Weeks in Treatment: 0 Visit Information History Since Last Visit All ordered tests and consults were completed: No Patient Arrived: [name] Added or deleted any medications: No Arrival Time: 11:22 Any new allergies or adverse reactions: No Accompanied By: wife Had a fall or experienced change in No Transfer Assistance: None activities of daily living that may affect Patient Has Alerts: Yes risk of falls: Patient Alerts: Hgb a1c 5.3 (05-23-17) Signs or symptoms of abuse/neglect since last visito No Hospitalized since last visit: No Implantable device outside of the clinic excluding No cellular tissue based products placed in the center since last visit: Pain Present Now: No Electronic Signature(s) Signed: 11/03/2017 11:54:50 AM By: Curtis Sites Entered By: Curtis Sites on 11/03/2017 11:28:43 Seat, Jorje Guild ([id number]) -------------------------------------------------------------------------------- Clinic Level of Care Assessment Details Patient Name: [name] Date of Service: 11/03/2017 11:15 AM Medical Record Number: [id number] Patient Account Number: [id number] Date of Birth/Sex: May 04, 1945 (72 y.o. M) Treating RN: Curtis Sites Primary Care Zebulun Deman: Etheleen Nicks Other Clinician: Referring Heavenleigh Petruzzi: Etheleen Nicks Treating Stefania Goulart/Extender: Linwood Dibbles, HOYT Weeks in Treatment: 0 Clinic Level of Care Assessment Items TOOL 4 Quantity Score X - Use when only an EandM is performed on FOLLOW-UP visit 1 0 ASSESSMENTS - Nursing Assessment /  Reassessment X - Reassessment of Co-morbidities (includes updates in patient status) 1 10 X- 1 5 Reassessment of Adherence to Treatment Plan ASSESSMENTS - Wound and Skin Assessment / Reassessment X - Simple Wound Assessment / Reassessment - one wound 1 5 []  - 0 Complex Wound Assessment / Reassessment - multiple wounds []  - 0 Dermatologic / Skin Assessment (not related to wound area) ASSESSMENTS - Focused Assessment []  - Circumferential Edema Measurements - multi extremities 0 []  - 0 Nutritional Assessment / Counseling / Intervention []  - 0 Lower Extremity Assessment (monofilament, tuning fork, pulses) []  - 0 Peripheral Arterial Disease Assessment (using hand held doppler) ASSESSMENTS - Ostomy and/or Continence Assessment and Care []  - Incontinence Assessment and Management 0 []  - 0 Ostomy Care Assessment and Management (repouching, etc.) PROCESS - Coordination of Care X - Simple Patient / Family Education for ongoing care 1 15 []  - 0 Complex (extensive) Patient / Family Education for ongoing care []  - 0 Staff obtains Chiropractor, Records, Test Results / Process Orders []  - 0 Staff telephones HHA, Nursing Homes / Clarify orders / etc []  - 0 Routine Transfer to another Facility (non-emergent condition) []  - 0 Routine Hospital Admission (non-emergent condition) []  - 0 New Admissions / Manufacturing engineer / Ordering NPWT, Apligraf, etc. []  - 0 Emergency Hospital Admission (emergent condition) X- 1 10 Simple Discharge Coordination Bednarz, Kannan R. ([id number]) []  - 0 Complex (extensive) Discharge Coordination PROCESS - Special Needs []  - Pediatric / Minor Patient Management 0 []  - 0 Isolation Patient Management []  - 0 Hearing / Language / Visual special needs []  - 0 Assessment of Community assistance (transportation, D/C planning, etc.) []  - 0 Additional assistance / Altered mentation []  - 0 Support Surface(s) Assessment (bed, cushion, seat, etc.) INTERVENTIONS -  Wound Cleansing / Measurement X - Simple Wound Cleansing - one wound 1 5 []  - 0 Complex Wound Cleansing - multiple wounds X- 1  5 Wound Imaging (photographs - any number of wounds) []  - 0 Wound Tracing (instead of photographs) X- 1 5 Simple Wound Measurement - one wound []  - 0 Complex Wound Measurement - multiple wounds INTERVENTIONS - Wound Dressings []  - Small Wound Dressing one or multiple wounds 0 X- 1 15 Medium Wound Dressing one or multiple wounds []  - 0 Large Wound Dressing one or multiple wounds []  - 0 Application of Medications - topical []  - 0 Application of Medications - injection INTERVENTIONS - Miscellaneous []  - External ear exam 0 []  - 0 Specimen Collection (cultures, biopsies, blood, body fluids, etc.) []  - 0 Specimen(s) / Culture(s) sent or taken to Lab for analysis []  - 0 Patient Transfer (multiple staff / Nurse, adult / Similar devices) []  - 0 Simple Staple / Suture removal (25 or less) []  - 0 Complex Staple / Suture removal (26 or more) []  - 0 Hypo / Hyperglycemic Management (close monitor of Blood Glucose) []  - 0 Ankle / Brachial Index (ABI) - do not check if billed separately []  - 0 Vital Signs Matlin, Ruddy R. ([id number]) Has the patient been seen at the hospital within the last three years: Yes Total Score: 75 Level Of Care: New/Established - Level 2 Electronic Signature(s) Signed: 11/03/2017 11:54:50 AM By: Curtis Sites Entered By: Curtis Sites on 11/03/2017 11:31:32 Mudry, Jorje Guild ([id number]) -------------------------------------------------------------------------------- Compression Therapy Details Patient Name: [name] Date of Service: 11/03/2017 11:15 AM Medical Record Number: [id number] Patient Account Number: [id number] Date of Birth/Sex: 1945/06/22 (72 y.o. M) Treating RN: Curtis Sites Primary Care Carlita Whitcomb: Etheleen Nicks Other Clinician: Referring Labrandon Knoch: Etheleen Nicks Treating Seryna Marek/Extender: Linwood Dibbles,  HOYT Weeks in Treatment: 0 Compression Therapy Performed for Wound Assessment: Wound #6 Right,Lateral Malleolus Performed By: Clinician Curtis Sites, RN Compression Type: Three Layer Electronic Signature(s) Signed: 11/03/2017 11:54:50 AM By: Curtis Sites Entered By: Curtis Sites on 11/03/2017 11:29:44 Mainer, Jorje Guild ([id number]) -------------------------------------------------------------------------------- Encounter Discharge Information Details Patient Name: [name] Date of Service: 11/03/2017 11:15 AM Medical Record Number: [id number] Patient Account Number: [id number] Date of Birth/Sex: 12/13/1945 (72 y.o. M) Treating RN: Curtis Sites Primary Care Mattia Liford: Etheleen Nicks Other Clinician: Referring Aviyana Sonntag: Etheleen Nicks Treating Archer Moist/Extender: Linwood Dibbles, HOYT Weeks in Treatment: 0 Encounter Discharge Information Items Discharge Condition: Stable Ambulatory Status: Cane Discharge Destination: Home Transportation: Private Auto Schedule Follow-up Appointment: Yes Clinical Summary of Care: Electronic Signature(s) Signed: 11/03/2017 11:54:50 AM By: Curtis Sites Entered By: Curtis Sites on 11/03/2017 11:31:10 [name] ([id number]) -------------------------------------------------------------------------------- Patient/Caregiver Education Details Patient Name: [name] Date of Service: 11/03/2017 11:15 AM Medical Record Number: [id number] Patient Account Number: [id number] Date of Birth/Gender: Nov 22, 1945 (72 y.o. M) Treating RN: Curtis Sites Primary Care Physician: Etheleen Nicks Other Clinician: Referring Physician: Etheleen Nicks Treating Physician/Extender: Skeet Simmer in Treatment: 0 Education Assessment Education Provided To: Patient Education Topics Provided Wound/Skin Impairment: Handouts: Caring for Your Ulcer Methods: Explain/Verbal Responses: State content correctly Electronic Signature(s) Signed: 11/03/2017  11:54:50 AM By: Curtis Sites Entered By: Curtis Sites on 11/03/2017 11:30:55 Widmayer, Jorje Guild ([id number]) -------------------------------------------------------------------------------- Wound Assessment Details Patient Name: [name] Date of Service: 11/03/2017 11:15 AM Medical Record Number: [id number] Patient Account Number: [id number] Date of Birth/Sex: 08-26-1945 (72 y.o. M) Treating RN: Curtis Sites Primary Care Thuy Atilano: Etheleen Nicks Other Clinician: Referring Ashdon Gillson: Etheleen Nicks Treating Rimas Gilham/Extender: STONE III, HOYT Weeks in Treatment: 0 Wound Status Wound Number: 6 Primary Lymphedema Etiology: Wound Location: Right Malleolus - Lateral Wound Status: Open Wounding Event: Gradually Appeared Comorbid  Anemia, Sleep Apnea, Hypertension, Date Acquired: 09/27/2017 History: Osteoarthritis Weeks Of Treatment: 0 Clustered Wound: No Photos Photo Uploaded By: Curtis Sites on 11/03/2017 11:55:38 Wound Measurements Length: (cm) 4 Width: (cm) 2.5 Depth: (cm) 0.2 Area: (cm) 7.854 Volume: (cm) 1.571 % Reduction in Area: -78.6% % Reduction in Volume: -78.5% Epithelialization: None Tunneling: No Undermining: No Wound Description Full Thickness Without Exposed Support Classification: Structures Wound Margin: Flat and Intact Exudate Medium Amount: Exudate Type: Serosanguineous Exudate Color: red, brown Foul Odor After Cleansing: No Slough/Fibrino Yes Wound Bed Granulation Amount: Medium (34-66%) Exposed Structure Granulation Quality: Red, Pink Fascia Exposed: No Necrotic Amount: Medium (34-66%) Fat Layer (Subcutaneous Tissue) Exposed: Yes Necrotic Quality: Adherent Slough Tendon Exposed: No Muscle Exposed: No Joint Exposed: No Bone Exposed: No Trawick, Mearle R. ([id number]) Periwound Skin Texture Texture Color No Abnormalities Noted: No No Abnormalities Noted: No Callus: No Atrophie Blanche: No Crepitus: No Cyanosis: No Excoriation:  No Ecchymosis: Yes Induration: No Erythema: No Rash: No Hemosiderin Staining: No Scarring: No Mottled: No Pallor: No Moisture Rubor: No No Abnormalities Noted: No Dry / Scaly: Yes Temperature / Pain Maceration: No Temperature: No Abnormality Wound Preparation Ulcer Cleansing: Rinsed/Irrigated with Saline Treatment Notes Wound #6 (Right, Lateral Malleolus) 1. Cleansed with: Clean wound with Normal Saline 3. Peri-wound Care: Moisturizing lotion 4. Dressing Applied: Iodoflex 7. Secured with 3 Layer Compression System - Right Lower Extremity Notes xtrasorb, unna to Ecologist) Signed: 11/03/2017 11:54:50 AM By: Curtis Sites Entered By: Curtis Sites on 11/03/2017 11:29:26

## 2017-11-13 ENCOUNTER — Encounter: Payer: Medicare Other | Admitting: Physician Assistant

## 2017-11-13 DIAGNOSIS — E11622 Type 2 diabetes mellitus with other skin ulcer: Secondary | ICD-10-CM | POA: Diagnosis not present

## 2017-11-20 ENCOUNTER — Encounter: Payer: Medicare Other | Admitting: Physician Assistant

## 2017-11-20 DIAGNOSIS — E11622 Type 2 diabetes mellitus with other skin ulcer: Secondary | ICD-10-CM | POA: Diagnosis not present

## 2017-11-20 NOTE — Progress Notes (Signed)
[name] ([id number]) Visit Report for 11/13/2017 Arrival Information Details Patient Name: [name], [name]. Date of Service: 11/13/2017 11:15 AM Medical Record Number: [id number] Patient Account Number: [id number] Date of Birth/Sex: Apr 19, 1945 (72 y.o. M) Treating RN: Rema Jasmine Primary Care Embry Manrique: Etheleen Nicks Other Clinician: Referring Aleksi Brummet: Etheleen Nicks Treating Glenice Ciccone/Extender: Linwood Dibbles, HOYT Weeks in Treatment: 2 Visit Information History Since Last Visit Added or deleted any medications: No Patient Arrived: Cane Any new allergies or adverse reactions: No Arrival Time: 11:22 Had a fall or experienced change in No Accompanied By: wife activities of daily living that may affect Transfer Assistance: None risk of falls: Patient Identification Verified: Yes Signs or symptoms of abuse/neglect since last visito No Secondary Verification Process Yes Hospitalized since last visit: No Completed: Implantable device outside of the clinic excluding No Patient Has Alerts: Yes cellular tissue based products placed in the center Patient Alerts: Hgb a1c 5.3 (4-27- since last visit: 19) Has Dressing in Place as Prescribed: Yes Pain Present Now: No Electronic Signature(s) Signed: 11/13/2017 3:28:11 PM By: Rema Jasmine Entered By: Rema Jasmine on 11/13/2017 11:26:53 Hinesley, Jorje Guild ([id number]) -------------------------------------------------------------------------------- Encounter Discharge Information Details Patient Name: [name]. Date of Service: 11/13/2017 11:15 AM Medical Record Number: [id number] Patient Account Number: [id number] Date of Birth/Sex: 1945/11/30 (72 y.o. M) Treating RN: Curtis Sites Primary Care Shavelle Runkel: Etheleen Nicks Other Clinician: Referring Gloristine Turrubiates: Etheleen Nicks Treating Jessee Mezera/Extender: Linwood Dibbles, HOYT Weeks in Treatment: 2 Encounter Discharge Information Items Discharge Condition: Stable Ambulatory Status:  Cane Discharge Destination: Home Transportation: Private Auto Accompanied By: spouse Schedule Follow-up Appointment: Yes Clinical Summary of Care: Post Procedure Vitals: Temperature (F): 98.2 Pulse (bpm): 60 Respiratory Rate (breaths/min): 16 Blood Pressure (mmHg): 154/75 Electronic Signature(s) Signed: 11/13/2017 5:23:22 PM By: Curtis Sites Entered By: Curtis Sites on 11/13/2017 11:51:32 Biagini, Jorje Guild ([id number]) -------------------------------------------------------------------------------- Lower Extremity Assessment Details Patient Name: [name]. Date of Service: 11/13/2017 11:15 AM Medical Record Number: [id number] Patient Account Number: [id number] Date of Birth/Sex: 29-Jun-1945 (72 y.o. M) Treating RN: Rema Jasmine Primary Care Crystall Donaldson: Etheleen Nicks Other Clinician: Referring Alisea Matte: Etheleen Nicks Treating Kinzly Pierrelouis/Extender: Linwood Dibbles, HOYT Weeks in Treatment: 2 Edema Assessment Assessed: [Left: No] [Right: No] Edema: [Left: N] [Right: o] Calf Left: Right: Point of Measurement: 35 cm From Medial Instep cm 39 cm Ankle Left: Right: Point of Measurement: 10 cm From Medial Instep cm 25.5 cm Vascular Assessment Claudication: Claudication Assessment [Right:None] Pulses: Dorsalis Pedis Palpable: [Right:Yes] Posterior Tibial Extremity colors, hair growth, and conditions: Extremity Color: [Right:Hyperpigmented] Hair Growth on Extremity: [Right:No] Temperature of Extremity: [Right:Warm] Capillary Refill: [Right:< 3 seconds] Toe Nail Assessment Left: Right: Thick: Yes Discolored: Yes Deformed: Yes Improper Length and Hygiene: No Electronic Signature(s) Signed: 11/13/2017 3:28:11 PM By: Rema Jasmine Entered By: Rema Jasmine on 11/13/2017 11:37:11 Mihalko, Alvis R. ([id number]) -------------------------------------------------------------------------------- Multi Wound Chart Details Patient Name: [name]. Date of Service: 11/13/2017 11:15  AM Medical Record Number: [id number] Patient Account Number: [id number] Date of Birth/Sex: [birth year] (72 y.o. M) Treating RN: Curtis Sites Primary Care Suliman Termini: Etheleen Nicks Other Clinician: Referring Derico Mitton: Etheleen Nicks Treating Raeann Offner/Extender: Linwood Dibbles, HOYT Weeks in Treatment: 2 Vital Signs Height(in): Pulse(bpm): 60 Weight(lbs): Blood Pressure(mmHg): 154/75 Body Mass Index(BMI): Temperature(F): 98.2 Respiratory Rate 18 (breaths/min): Photos: [N/A:N/A] Wound Location: Right Malleolus - Lateral N/A N/A Wounding Event: Gradually Appeared N/A N/A Primary Etiology: Lymphedema N/A N/A Comorbid History: Anemia, Sleep Apnea, N/A N/A Hypertension, Osteoarthritis Date Acquired: 09/27/2017 N/A N/A Weeks of Treatment: 2 N/A N/A Wound Status: Open N/A  N/A Measurements L x W x D 4x2x0.2 N/A N/A (cm) Area (cm) : 6.283 N/A N/A Volume (cm) : 1.257 N/A N/A % Reduction in Area: -42.90% N/A N/A % Reduction in Volume: -42.80% N/A N/A Classification: Full Thickness Without N/A N/A Exposed Support Structures Exudate Amount: Medium N/A N/A Exudate Type: Serous N/A N/A Exudate Color: amber N/A N/A Wound Margin: Flat and Intact N/A N/A Granulation Amount: Small (1-33%) N/A N/A Granulation Quality: Red, Pink N/A N/A Necrotic Amount: Large (67-100%) N/A N/A Necrotic Tissue: Eschar, Adherent Slough N/A N/A Exposed Structures: Fat Layer (Subcutaneous N/A N/A Tissue) Exposed: Yes Fascia: No Tendon: No Muscle: No Attar, Nikia R. ([id number]) Joint: No Bone: No Epithelialization: None N/A N/A Periwound Skin Texture: Excoriation: No N/A N/A Induration: No Callus: No Crepitus: No Rash: No Scarring: No Periwound Skin Moisture: Maceration: Yes N/A N/A Dry/Scaly: No Periwound Skin Color: Ecchymosis: Yes N/A N/A Hemosiderin Staining: Yes Atrophie Blanche: No Cyanosis: No Erythema: No Mottled: No Pallor: No Rubor: No Temperature: No Abnormality N/A N/A Tenderness  on Palpation: No N/A N/A Wound Preparation: Ulcer Cleansing: N/A N/A Rinsed/Irrigated with Saline Topical Anesthetic Applied: Other: lidocaine 4% Treatment Notes Electronic Signature(s) Signed: 11/13/2017 5:23:22 PM By: Curtis Sites Entered By: Curtis Sites on 11/13/2017 11:44:27 Kertesz, Jorje Guild ([id number]) -------------------------------------------------------------------------------- Multi-Disciplinary Care Plan Details Patient Name: [name]. Date of Service: 11/13/2017 11:15 AM Medical Record Number: [id number] Patient Account Number: [id number] Date of Birth/Sex: [birth year] (72 y.o. M) Treating RN: Curtis Sites Primary Care Karin Griffith: Etheleen Nicks Other Clinician: Referring Christan Defranco: Etheleen Nicks Treating Yui Mulvaney/Extender: Linwood Dibbles, HOYT Weeks in Treatment: 2 Active Inactive ` Abuse / Safety / Falls / Self Care Management Nursing Diagnoses: Impaired physical mobility Goals: Patient will not develop complications from immobility Date Initiated: 10/30/2017 Target Resolution Date: 01/22/2018 Goal Status: Active Interventions: Assess fall risk on admission and as needed Notes: ` Orientation to the Wound Care Program Nursing Diagnoses: Knowledge deficit related to the wound healing center program Goals: Patient/caregiver will verbalize understanding of the Wound Healing Center Program Date Initiated: 10/30/2017 Target Resolution Date: 01/22/2018 Goal Status: Active Interventions: Provide education on orientation to the wound center Notes: ` Venous Leg Ulcer Nursing Diagnoses: Potential for venous Insuffiency (use before diagnosis confirmed) Goals: Patient will maintain optimal edema control Date Initiated: 10/30/2017 Target Resolution Date: 01/22/2018 Goal Status: Active Interventions: ABOUBACAR, MATSUO R. ([id number]) Assess peripheral edema status every visit. Compression as ordered Notes: ` Wound/Skin Impairment Nursing Diagnoses: Impaired  tissue integrity Goals: Ulcer/skin breakdown will heal within 14 weeks Date Initiated: 10/30/2017 Target Resolution Date: 01/22/2018 Goal Status: Active Interventions: Assess patient/caregiver ability to obtain necessary supplies Assess patient/caregiver ability to perform ulcer/skin care regimen upon admission and as needed Assess ulceration(s) every visit Notes: Electronic Signature(s) Signed: 11/13/2017 5:23:22 PM By: Curtis Sites Entered By: Curtis Sites on 11/13/2017 11:43:41 Oka, Jerame R. ([id number]) -------------------------------------------------------------------------------- Pain Assessment Details Patient Name: [name]. Date of Service: 11/13/2017 11:15 AM Medical Record Number: [id number] Patient Account Number: [id number] Date of Birth/Sex: May 08, 1945 (72 y.o. M) Treating RN: Rema Jasmine Primary Care Tayvon Culley: Etheleen Nicks Other Clinician: Referring Quanasia Defino: Etheleen Nicks Treating Talan Gildner/Extender: Linwood Dibbles, HOYT Weeks in Treatment: 2 Active Problems Location of Pain Severity and Description of Pain Patient Has Paino No Site Locations Pain Management and Medication Current Pain Management: Goals for Pain Management pt denies any pain at this time. Electronic Signature(s) Signed: 11/13/2017 3:28:11 PM By: Rema Jasmine Entered By: Rema Jasmine on 11/13/2017 11:27:31 Mcadam, Jorje Guild ([id number]) -------------------------------------------------------------------------------- Patient/Caregiver Education Details Patient  Name: JAFET, WISSING. Date of Service: 11/13/2017 11:15 AM Medical Record Number: [id number] Patient Account Number: [id number] Date of Birth/Gender: [birth year] (72 y.o. M) Treating RN: Curtis Sites Primary Care Physician: Etheleen Nicks Other Clinician: Referring Physician: Etheleen Nicks Treating Physician/Extender: Skeet Simmer in Treatment: 2 Education Assessment Education Provided To: Patient Education Topics  Provided Venous: Handouts: Other: leg elevation Methods: Explain/Verbal Responses: State content correctly Electronic Signature(s) Signed: 11/13/2017 5:23:22 PM By: Curtis Sites Entered By: Curtis Sites on 11/13/2017 11:49:28 Uber, Jorje Guild ([id number]) -------------------------------------------------------------------------------- Wound Assessment Details Patient Name: [name] Date of Service: 11/13/2017 11:15 AM Medical Record Number: [id number] Patient Account Number: [id number] Date of Birth/Sex: 10/14/1945 (72 y.o. M) Treating RN: Rema Jasmine Primary Care Vitalia Stough: Etheleen Nicks Other Clinician: Referring Georganne Siple: Etheleen Nicks Treating Keyah Blizard/Extender: Linwood Dibbles, HOYT Weeks in Treatment: 2 Wound Status Wound Number: 6 Primary Lymphedema Etiology: Wound Location: Right Malleolus - Lateral Wound Status: Open Wounding Event: Gradually Appeared Comorbid Anemia, Sleep Apnea, Hypertension, Date Acquired: 09/27/2017 History: Osteoarthritis Weeks Of Treatment: 2 Clustered Wound: No Photos Photo Uploaded By: Rema Jasmine on 11/13/2017 11:40:30 Wound Measurements Length: (cm) 4 Width: (cm) 2 Depth: (cm) 0.2 Area: (cm) 6.283 Volume: (cm) 1.257 % Reduction in Area: -42.9% % Reduction in Volume: -42.8% Epithelialization: None Tunneling: No Wound Description Full Thickness Without Exposed Support Foul O Classification: Structures Slough Wound Margin: Flat and Intact Exudate Medium Amount: Exudate Type: Serous Exudate Color: amber dor After Cleansing: No /Fibrino Yes Wound Bed Granulation Amount: Small (1-33%) Exposed Structure Granulation Quality: Red, Pink Fascia Exposed: No Necrotic Amount: Large (67-100%) Fat Layer (Subcutaneous Tissue) Exposed: Yes Necrotic Quality: Eschar, Adherent Slough Tendon Exposed: No Muscle Exposed: No Joint Exposed: No Bone Exposed: No Longbottom, Dontavion R. ([id number]) Periwound Skin Texture Texture Color No  Abnormalities Noted: No No Abnormalities Noted: No Callus: No Atrophie Blanche: No Crepitus: No Cyanosis: No Excoriation: No Ecchymosis: Yes Induration: No Erythema: No Rash: No Hemosiderin Staining: Yes Scarring: No Mottled: No Pallor: No Moisture Rubor: No No Abnormalities Noted: No Dry / Scaly: No Temperature / Pain Maceration: Yes Temperature: No Abnormality Wound Preparation Ulcer Cleansing: Rinsed/Irrigated with Saline Topical Anesthetic Applied: Other: lidocaine 4%, Treatment Notes Wound #6 (Right, Lateral Malleolus) 1. Cleansed with: Cleanse wound with antibacterial soap and water 2. Anesthetic Topical Lidocaine 4% cream to wound bed prior to debridement 3. Peri-wound Care: Moisturizing lotion 4. Dressing Applied: Iodoflex Other dressing (specify in notes) 5. Secondary Dressing Applied ABD Pad 7. Secured with 3 Layer Compression System - Right Lower Extremity Notes KerraMax, unna to anchor Electronic Signature(s) Signed: 11/13/2017 3:28:11 PM By: Rema Jasmine Entered By: Rema Jasmine on 11/13/2017 11:37:48 Neubecker, Jorje Guild ([id number]) -------------------------------------------------------------------------------- Vitals Details Patient Name: [name]. Date of Service: 11/13/2017 11:15 AM Medical Record Number: [id number] Patient Account Number: [id number] Date of Birth/Sex: 06-23-1945 (72 y.o. M) Treating RN: Rema Jasmine Primary Care Kylan Veach: Etheleen Nicks Other Clinician: Referring Sinclair Arrazola: Etheleen Nicks Treating Avannah Decker/Extender: Linwood Dibbles, HOYT Weeks in Treatment: 2 Vital Signs Time Taken: 11:27 Temperature (F): 98.2 Pulse (bpm): 60 Respiratory Rate (breaths/min): 18 Blood Pressure (mmHg): 154/75 Reference Range: 80 - 120 mg / dl Electronic Signature(s) Signed: 11/13/2017 3:28:11 PM By: Rema Jasmine Entered By: Rema Jasmine on 11/13/2017 11:38:08

## 2017-11-20 NOTE — Progress Notes (Signed)
RALSTON, VENUS ([id number]) Visit Report for 11/13/2017 Chief Complaint Document Details Patient Name: [name], [name]. Date of Service: 11/13/2017 11:15 AM Medical Record Number: [id number] Patient Account Number: [id number] Date of Birth/Sex: Mar 11, 1945 (72 y.o. M) Treating RN: Curtis Sites Primary Care Provider: Etheleen Nicks Other Clinician: Referring Provider: Etheleen Nicks Treating Provider/Extender: Linwood Dibbles, HOYT Weeks in Treatment: 2 Information Obtained from: Patient Chief Complaint Right lateral ankle ulcer Electronic Signature(s) Signed: 11/19/2017 1:45:00 AM By: Lenda Kelp PA-C Entered By: Lenda Kelp on 11/13/2017 11:36:30 [name], [name] ([id number]) -------------------------------------------------------------------------------- Debridement Details Patient Name: [name]. Date of Service: 11/13/2017 11:15 AM Medical Record Number: [id number] Patient Account Number: [id number] Date of Birth/Sex: 1945/08/19 (72 y.o. M) Treating RN: Curtis Sites Primary Care Provider: Etheleen Nicks Other Clinician: Referring Provider: Etheleen Nicks Treating Provider/Extender: Linwood Dibbles, HOYT Weeks in Treatment: 2 Debridement Performed for Wound #6 Right,Lateral Malleolus Assessment: Performed By: Physician STONE III, HOYT E., PA-C Debridement Type: Debridement Level of Consciousness (Pre- Awake and Alert procedure): Pre-procedure Verification/Time Yes - 11:44 Out Taken: Start Time: 11:44 Pain Control: Lidocaine 4% Topical Solution Total Area Debrided (L x W): 4 (cm) x 2 (cm) = 8 (cm) Tissue and other material Viable, Non-Viable, Slough, Subcutaneous, Slough debrided: Level: Skin/Subcutaneous Tissue Debridement Description: Excisional Instrument: Curette Bleeding: Minimum Hemostasis Achieved: Pressure End Time: 11:47 Procedural Pain: 0 Post Procedural Pain: 0 Response to Treatment: Procedure was tolerated well Level of Consciousness Awake  and Alert (Post-procedure): Post Debridement Measurements of Total Wound Length: (cm) 4 Width: (cm) 2 Depth: (cm) 0.3 Volume: (cm) 1.885 Character of Wound/Ulcer Post Debridement: Improved Post Procedure Diagnosis Same as Pre-procedure Electronic Signature(s) Signed: 11/13/2017 5:23:22 PM By: Curtis Sites Signed: 11/19/2017 1:45:00 AM By: Lenda Kelp PA-C Entered By: Curtis Sites on 11/13/2017 11:47:28 [name], [name] ([id number]) -------------------------------------------------------------------------------- HPI Details Patient Name: [name]. Date of Service: 11/13/2017 11:15 AM Medical Record Number: [id number] Patient Account Number: [id number] Date of Birth/Sex: Mar 31, 1945 (72 y.o. M) Treating RN: Curtis Sites Primary Care Provider: Etheleen Nicks Other Clinician: Referring Provider: Etheleen Nicks Treating Provider/Extender: Linwood Dibbles, HOYT Weeks in Treatment: 2 History of Present Illness Location: right lower extremity Quality: Patient has been experiencing much less pain currently Severity: 1 out of 10 Duration: Patient has had the wound for approximately 3 weeks prior to presenting for treatment Timing: Pain occurs most often with manipulation of the wound Context: Patient dropped a cardboard box which struck his leg causing the initial injury which hasn't healed since onset Modifying Factors: Currently patient has been using over-the-counter antibiotic ointment Associated Signs and Symptoms: Patient reports having difficulty standing for long periods. HPI Description: As noted above the patient had a injury due to working in the yard and this rapidly progressed to a ulcerated area with an infection. In October 2014 he did have some vascular studies done but never saw a Careers adviser and never had any surgery for varicose veins. His past medical history is significant for hypertension, obstructive sleep apnea, morbid obesity, stasis dermatitis of both  lower extremities and his past surgical history is suggestive of hip surgery on the right, gastric bypass surgery, cholecystectomy, hip replacement. He has recently been put on Levaquin 500 milligrams daily, Bactrim DS 1 twice a day and Bactroban 2% topical ointment locally. No recent labs or vascular or imaging studies done. 07/11/2014 a venous duplex study was done on 07/05/2014 -- it showed no evidence of deep vein thrombosis or superficial thrombosis bilaterally. There was incompetence of the  greater saphenous veins bilaterally and incompetence of the right small saphenous vein. The patient has an appointment to see the vascular surgeons on June 20. 07/18/2014 his appointment with the vascular surgeons is rescheduled for this afternoon. 07/25/2014 -- he saw the vascular surgeon and is going to have a endovenous procedure done on July 29. 08/01/2014 -- he was diagnosed with a UTI and is on ciprofloxacin for 10 days. readmission: 11/27/15 On evaluation today patient is having discomfort in the right ankle region following an open wound from having dropped a cardboard box striking his ankle. He tells me that this has not healed despite many of the measures that he learned from previous injuries when he was seen at our wound center. Therefore he didn't come in for reevaluation today. He has not noted any significant purulent discharge at this point in time. 12/04/15 patient appears to be doing somewhat better at this point in time today in regard to his ankle wound. it is measuring smaller and looking cleaner. 12/11/15; patient wound continues to look improve this is on the right lateral ankle. Debrided of surface slough and nonviable subcutaneous tissue. Using silver alginate 12-18-15 Mr. Kratz presents today with his wife for follow-up on right lower extremity venous ulcer. He denies any changes or complications over the past week. He is tolerating compression therapy to his right lower  extremity and has a properly fitting compression hose to the left lower extremity. 12/25/15; patient with a wound on his right lateral lower extremity in the setting of severe venous insufficiency [originally trauma against the box]. His edema is well controlled he is using Prisma. 01/01/16; continued improvement using Prisma. He has severe venous insufficiency however the wound was originally trauma against a box. 01/08/16; his wound is totally epithelialized. We will give him measurements for graded pressure stockings which I think he is going to get at elastic therapy in [facility], [place]. ([id number]) Readmission: 10/30/17 patient presents today for reevaluation in our clinic although it has been since 2017 that we've last seen him. He has a ulcer on the right lateral ankle which he tells me began roughly 2 months ago. He states that he had one similar on the left ankle although this completely healed without any complication. However the one on the right as continue to give him a lot of trouble and is not healing appropriately. He really was not sure what the best thing to put on the area would be and therefore he's just been putting advantage to cover it. Another treatment has been utilized at this time. Fortunately there is not appear to be any evidence of significant infection necessarily although there is some evidence of abnormal discoloration in regard to the base of the wound that does not look as healthy as I was on the lives life. He does have erythema surrounding but I'm not sure if this is just due to chronic venous stasis/lymphedema or if it truly is anything infection wise going on at this point. Nonetheless in general the patient seems to be doing fairly well he's not having too much pain although he does have some discomfort. No fevers, chills, nausea, or vomiting noted at this time. 11/13/17 on evaluation today patient actually appears to be doing much  better in regard to his wound currently. There does seem to be evidence of good improvement there still a lot of maceration and some necrotic tissue on the surface of the wound but this was cleaned away very well  today. Overall I'm very pleased with how things seem to be progressing in general. We did get the results of his culture back which showed that he did have evidence of Staphylococcus aureus as well as group B strep. It does appear that the Bactrim was sensitive for both. The wound again does look better. Electronic Signature(s) Signed: 11/19/2017 1:45:00 AM By: Lenda Kelp PA-C Entered By: Lenda Kelp on 11/13/2017 11:52:12 [name], [name] ([id number]) -------------------------------------------------------------------------------- Physical Exam Details Patient Name: [name]. Date of Service: 11/13/2017 11:15 AM Medical Record Number: [id number] Patient Account Number: [id number] Date of Birth/Sex: 20-Feb-1945 (72 y.o. M) Treating RN: Curtis Sites Primary Care Provider: Etheleen Nicks Other Clinician: Referring Provider: Etheleen Nicks Treating Provider/Extender: STONE III, HOYT Weeks in Treatment: 2 Constitutional Well-nourished and well-hydrated in no acute distress. Respiratory normal breathing without difficulty. Psychiatric this patient is able to make decisions and demonstrates good insight into disease process. Alert and Oriented x 3. pleasant and cooperative. Notes Patient's wound bed currently did require sharp debridement. This was performed today to clean away the slough and necrotic material from the surface of the wound. Post debridement the wound bed appears much better. He did get the FarrowWrap's for the legs bilaterally. He is wearing this on the left that's much easier for him to put on. On the right obviously we are still wrapping his leg with a compression wrap. Electronic Signature(s) Signed: 11/19/2017 1:45:00 AM By: Lenda Kelp  PA-C Entered By: Lenda Kelp on 11/13/2017 11:53:09 [name], [name] ([id number]) -------------------------------------------------------------------------------- Physician Orders Details Patient Name: [name]. Date of Service: 11/13/2017 11:15 AM Medical Record Number: [id number] Patient Account Number: [id number] Date of Birth/Sex: 10-17-1945 (72 y.o. M) Treating RN: Curtis Sites Primary Care Provider: Etheleen Nicks Other Clinician: Referring Provider: Etheleen Nicks Treating Provider/Extender: Linwood Dibbles, HOYT Weeks in Treatment: 2 Verbal / Phone Orders: No Diagnosis Coding ICD-10 Coding Code Description I87.2 Venous insufficiency (chronic) (peripheral) I89.0 Lymphedema, not elsewhere classified L97.312 Non-pressure chronic ulcer of right ankle with fat layer exposed E66.01 Morbid (severe) obesity due to excess calories Wound Cleansing Wound #6 Right,Lateral Malleolus o Clean wound with Normal Saline. o May Shower, gently pat wound dry prior to applying new dressing. Anesthetic (add to Medication List) Wound #6 Right,Lateral Malleolus o Topical Lidocaine 4% cream applied to wound bed prior to debridement (In Clinic Only). Primary Wound Dressing Wound #6 Right,Lateral Malleolus o Iodoflex Secondary Dressing Wound #6 Right,Lateral Malleolus o ABD pad o Other - KerraMax Dressing Change Frequency Wound #6 Right,Lateral Malleolus o Change dressing every week Follow-up Appointments Wound #6 Right,Lateral Malleolus o Return Appointment in 1 week. o Nurse Visit as needed - Mr Tucholski may call for wrap change if needed Edema Control Wound #6 Right,Lateral Malleolus o 3 Layer Compression System - Right Lower Extremity Additional Orders / Instructions Wound #6 Right,Lateral Malleolus [name], [name] ([id number]) o Increase protein intake. o Activity as tolerated Patient Medications Allergies: aspirin Notifications Medication Indication  Start End Bactrim DS 11/13/2017 DOSE 1 - oral 800 mg-160 mg tablet - 1 tablet oral taken 2 times a day for 7 days Electronic Signature(s) Signed: 11/13/2017 11:54:00 AM By: Lenda Kelp PA-C Entered By: Lenda Kelp on 11/13/2017 11:54:00 Shenker, [name] ([id number]) -------------------------------------------------------------------------------- Problem List Details Patient Name: [name] Date of Service: 11/13/2017 11:15 AM Medical Record Number: [id number] Patient Account Number: [id number] Date of Birth/Sex: [birth year] (72 y.o. M) Treating RN: Curtis Sites Primary Care Provider: Etheleen Nicks Other Clinician:  Referring Provider: Etheleen Nicks Treating Provider/Extender: Linwood Dibbles, HOYT Weeks in Treatment: 2 Active Problems ICD-10 Evaluated Encounter Code Description Active Date Today Diagnosis I87.2 Venous insufficiency (chronic) (peripheral) 10/30/2017 No Yes I89.0 Lymphedema, not elsewhere classified 10/30/2017 No Yes L97.312 Non-pressure chronic ulcer of right ankle with fat layer 10/30/2017 No Yes exposed E66.01 Morbid (severe) obesity due to excess calories 10/30/2017 No Yes Inactive Problems Resolved Problems Electronic Signature(s) Signed: 11/19/2017 1:45:00 AM By: Lenda Kelp PA-C Entered By: Lenda Kelp on 11/13/2017 11:36:20 Gordin, Maricela R. ([id number]) -------------------------------------------------------------------------------- Progress Note Details Patient Name: [name]. Date of Service: 11/13/2017 11:15 AM Medical Record Number: [id number] Patient Account Number: [id number] Date of Birth/Sex: Aug 30, 1945 (72 y.o. M) Treating RN: Curtis Sites Primary Care Provider: Etheleen Nicks Other Clinician: Referring Provider: Etheleen Nicks Treating Provider/Extender: Linwood Dibbles, HOYT Weeks in Treatment: 2 Subjective Chief Complaint Information obtained from Patient Right lateral ankle ulcer History of Present Illness (HPI) The  following HPI elements were documented for the patient's wound: Location: right lower extremity Quality: Patient has been experiencing much less pain currently Severity: 1 out of 10 Duration: Patient has had the wound for approximately 3 weeks prior to presenting for treatment Timing: Pain occurs most often with manipulation of the wound Context: Patient dropped a cardboard box which struck his leg causing the initial injury which hasn't healed since onset Modifying Factors: Currently patient has been using over-the-counter antibiotic ointment Associated Signs and Symptoms: Patient reports having difficulty standing for long periods. As noted above the patient had a injury due to working in the yard and this rapidly progressed to a ulcerated area with an infection. In October 2014 he did have some vascular studies done but never saw a Careers adviser and never had any surgery for varicose veins. His past medical history is significant for hypertension, obstructive sleep apnea, morbid obesity, stasis dermatitis of both lower extremities and his past surgical history is suggestive of hip surgery on the right, gastric bypass surgery, cholecystectomy, hip replacement. He has recently been put on Levaquin 500 milligrams daily, Bactrim DS 1 twice a day and Bactroban 2% topical ointment locally. No recent labs or vascular or imaging studies done. 07/11/2014 a venous duplex study was done on 07/05/2014 -- it showed no evidence of deep vein thrombosis or superficial thrombosis bilaterally. 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 The patient has an appointment to see the vascular surgeons on June 20. 07/18/2014 his appointment with the vascular surgeons is rescheduled for this afternoon. 07/25/2014 -- he saw the vascular surgeon and is going to have a endovenous procedure done on July 29. 08/01/2014 -- he was diagnosed with a UTI and  is on ciprofloxacin for 10 days. readmission: 11/27/15 On evaluation today patient is having discomfort in the right ankle region following an open wound from having dropped a cardboard box striking his ankle. He tells me that this has not healed despite many of the measures that he learned from previous injuries when he was seen at our wound center. Therefore he didn't come in for reevaluation today. He has not noted any significant purulent discharge at this point in time. 12/04/15 patient appears to be doing somewhat better at this point in time today in regard to his ankle wound. it is measuring smaller and looking cleaner. 12/11/15; patient wound continues to look improve this is on the right lateral ankle. Debrided of surface slough and nonviable subcutaneous tissue. Using silver alginate  12-18-15 Mr. Eskridge presents today with his wife for follow-up on right lower extremity venous ulcer. He denies any changes or complications over the past week. He is tolerating compression therapy to his right lower extremity and has a properly [name], [name] ([id number]) fitting compression hose to the left lower extremity. 12/25/15; patient with a wound on his right lateral lower extremity in the setting of severe venous insufficiency [originally trauma against the box]. His edema is well controlled he is using Prisma. 01/01/16; continued improvement using Prisma. He has severe venous insufficiency however the wound was originally trauma against a box. 01/08/16; his wound is totally epithelialized. We will give him measurements for graded pressure stockings which I think he is going to get at elastic therapy in [facility] Readmission: 10/30/17 patient presents today for reevaluation in our clinic although it has been since 2017 that we've last seen him. He has a ulcer on the right lateral ankle which he tells me began roughly 2 months ago. He states that he had one similar on the left ankle  although this completely healed without any complication. However the one on the right as continue to give him a lot of trouble and is not healing appropriately. He really was not sure what the best thing to put on the area would be and therefore he's just been putting advantage to cover it. Another treatment has been utilized at this time. Fortunately there is not appear to be any evidence of significant infection necessarily although there is some evidence of abnormal discoloration in regard to the base of the wound that does not look as healthy as I was on the lives life. He does have erythema surrounding but I'm not sure if this is just due to chronic venous stasis/lymphedema or if it truly is anything infection wise going on at this point. Nonetheless in general the patient seems to be doing fairly well he's not having too much pain although he does have some discomfort. No fevers, chills, nausea, or vomiting noted at this time. 11/13/17 on evaluation today patient actually appears to be doing much better in regard to his wound currently. There does seem to be evidence of good improvement there still a lot of maceration and some necrotic tissue on the surface of the wound but this was cleaned away very well today. Overall I'm very pleased with how things seem to be progressing in general. We did get the results of his culture back which showed that he did have evidence of Staphylococcus aureus as well as group B strep. It does appear that the Bactrim was sensitive for both. The wound again does look better. Patient History Information obtained from Patient. Family History Cancer - Mother, Diabetes - Mother, Heart Disease - Maternal Grandparents, No family history of Hereditary Spherocytosis, Hypertension, Kidney Disease, Lung Disease, Seizures, Stroke, Thyroid Problems, Tuberculosis. Social History Former smoker, Marital Status - Married, Alcohol Use - Rarely, Drug Use - No History, Caffeine  Use - Moderate. Medical History Hospitalization/Surgery History - 06/28/2003, [place], cellulitis. - 07/27/2005, [facility] [place], cellulitis. - 07/28/2003, [facility], hip replacement. - 06/30/2014, [facility], cellulitis. 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 Respiratory The patient has no complaints or symptoms. Cardiovascular Complains or has symptoms of LE edema. Psychiatric The patient has no complaints or symptoms. Rossy, Jaycen R. ([id number]) Objective Constitutional Well-nourished and well-hydrated in  no acute distress. Vitals Time Taken: 11:27 AM, Temperature: 98.2 F, Pulse: 60 bpm, Respiratory Rate: 18 breaths/min, Blood Pressure: 154/75 mmHg. Respiratory normal breathing without difficulty. Psychiatric this patient is able to make decisions and demonstrates good insight into disease process. Alert and Oriented x 3. pleasant and cooperative. General Notes: Patient's wound bed currently did require sharp debridement. This was performed today to clean away the slough and necrotic material from the surface of the wound. Post debridement the wound bed appears much better. He did get the FarrowWrap's for the legs bilaterally. He is wearing this on the left that's much easier for him to put on. On the right obviously we are still wrapping his leg with a compression wrap. Integumentary (Hair, Skin) Wound #6 status is Open. Original cause of wound was Gradually Appeared. The wound is located on the Right,Lateral Malleolus. The wound measures 4cm length x 2cm width x 0.2cm depth; 6.283cm^2 area and 1.257cm^3 volume. There is Fat Layer (Subcutaneous Tissue) Exposed exposed. There is no tunneling noted. There is a medium amount of serous drainage noted. The wound margin is flat and intact. There is small (1-33%) red, pink  granulation within the wound bed. There is a large (67-100%) amount of necrotic tissue within the wound bed including Eschar and Adherent Slough. The periwound skin appearance exhibited: Maceration, Ecchymosis, Hemosiderin Staining. The periwound skin appearance did not exhibit: Callus, Crepitus, Excoriation, Induration, Rash, Scarring, Dry/Scaly, Atrophie Blanche, Cyanosis, Mottled, Pallor, Rubor, Erythema. Periwound temperature was noted as No Abnormality. Assessment Active Problems ICD-10 Venous insufficiency (chronic) (peripheral) Lymphedema, not elsewhere classified Non-pressure chronic ulcer of right ankle with fat layer exposed Morbid (severe) obesity due to excess calories Procedures [name], [name] ([id number]) Wound #6 Pre-procedure diagnosis of Wound #6 is a Lymphedema located on the Right,Lateral Malleolus . There was a Excisional Skin/Subcutaneous Tissue Debridement with a total area of 8 sq cm performed by STONE III, HOYT E., PA-C. With the following instrument(s): Curette to remove Viable and Non-Viable tissue/material. Material removed includes Subcutaneous Tissue and Slough and after achieving pain control using Lidocaine 4% Topical Solution. No specimens were taken. A time out was conducted at 11:44, prior to the start of the procedure. A Minimum amount of bleeding was controlled with Pressure. The procedure was tolerated well with a pain level of 0 throughout and a pain level of 0 following the procedure. Post Debridement Measurements: 4cm length x 2cm width x 0.3cm depth; 1.885cm^3 volume. Character of Wound/Ulcer Post Debridement is improved. Post procedure Diagnosis Wound #6: Same as Pre-Procedure Plan Wound Cleansing: Wound #6 Right,Lateral Malleolus: Clean wound with Normal Saline. May Shower, gently pat wound dry prior to applying new dressing. Anesthetic (add to Medication List): Wound #6 Right,Lateral Malleolus: Topical Lidocaine 4% cream applied to wound bed  prior to debridement (In Clinic Only). Primary Wound Dressing: Wound #6 Right,Lateral Malleolus: Iodoflex Secondary Dressing: Wound #6 Right,Lateral Malleolus: ABD pad Other - KerraMax Dressing Change Frequency: Wound #6 Right,Lateral Malleolus: Change dressing every week Follow-up Appointments: Wound #6 Right,Lateral Malleolus: Return Appointment in 1 week. Nurse Visit as needed - Mr Deady may call for wrap change if needed Edema Control: Wound #6 Right,Lateral Malleolus: 3 Layer Compression System - Right Lower Extremity Additional Orders / Instructions: Wound #6 Right,Lateral Malleolus: Increase protein intake. Activity as tolerated The following medication(s) was prescribed: Bactrim DS oral 800 mg-160 mg tablet 1 1 tablet oral taken 2 times a day for 7 days starting 11/13/2017 I'm gonna suggest currently that we continue with the  above wound care measures for the next week. He's in agreement with plan. I am going to treat him with one more week of the Bactrim in order to ensure the infection is completely resolved. I do feel like it's doing much better. Subsequently will see him back for reevaluation next week to see were things stand. [name], [name] ([id number]) Please see above for specific wound care orders. We will see patient for re-evaluation in 1 week(s) here in the clinic. If anything worsens or changes patient will contact our office for additional recommendations. Electronic Signature(s) Signed: 11/19/2017 1:45:00 AM By: Lenda Kelp PA-C Entered By: Lenda Kelp on 11/13/2017 11:54:15 [name], [name] ([id number]) -------------------------------------------------------------------------------- ROS/PFSH Details Patient Name: [name]. Date of Service: 11/13/2017 11:15 AM Medical Record Number: [id number] Patient Account Number: [id number] Date of Birth/Sex: 1945/11/05 (72 y.o. M) Treating RN: Curtis Sites Primary Care Provider: Etheleen Nicks Other  Clinician: Referring Provider: Etheleen Nicks Treating Provider/Extender: Linwood Dibbles, HOYT Weeks in Treatment: 2 Information Obtained From Patient Wound History Do you currently have one or more open woundso Yes How many open wounds do you currently haveo 1 Approximately how long have you had your woundso 1 month + How have you been treating your wound(s) until nowo bandaid Has your wound(s) ever healed and then re-openedo No Have you had any lab work done in the past montho No Have you tested positive for an antibiotic resistant organism (MRSA, VRE)o No Have you tested positive for osteomyelitis (bone infection)o No Have you had any tests for circulation on your legso No Constitutional Symptoms (General Health) Complaints and Symptoms: Negative for: Fever; Chills Cardiovascular Complaints and Symptoms: Positive for: LE edema Medical History: Positive for: Hypertension Negative for: Angina; Arrhythmia; Congestive Heart Failure; Coronary Artery Disease; Deep Vein Thrombosis; Hypotension; Myocardial Infarction; Peripheral Arterial Disease; Peripheral Venous Disease; Phlebitis; Vasculitis Eyes Medical History: Negative for: Cataracts; Glaucoma; Optic Neuritis Ear/Nose/Mouth/Throat Medical History: Negative for: Chronic sinus problems/congestion; Middle ear problems Hematologic/Lymphatic Medical History: Positive for: Anemia - iron pills Negative for: Hemophilia; Human Immunodeficiency Virus; Lymphedema; Sickle Cell Disease Respiratory Complaints and Symptoms: No Complaints or Symptoms Medical History: YUSIF, GNAU. ([id number]) Positive for: Sleep Apnea - C-pap and oxygen 1.5L at night Negative for: Aspiration; Asthma; Chronic Obstructive Pulmonary Disease (COPD); Pneumothorax; Tuberculosis Gastrointestinal Medical History: Negative for: Cirrhosis ; Colitis; Crohnos; Hepatitis A; Hepatitis B; Hepatitis C Endocrine Medical History: Negative for: Type I Diabetes; Type II  Diabetes Genitourinary Medical History: Negative for: End Stage Renal Disease Immunological Medical History: Negative for: Lupus Erythematosus; Raynaudos; Scleroderma Integumentary (Skin) Medical History: Negative for: History of Burn; History of pressure wounds Musculoskeletal Medical History: Positive for: Osteoarthritis - left hip Negative for: Gout; Rheumatoid Arthritis; Osteomyelitis Past Medical History Notes: has artificial right hip and right shoulder Neurologic Medical History: Negative for: Dementia; Neuropathy; Quadriplegia; Paraplegia; Seizure Disorder Oncologic Medical History: Negative for: Received Chemotherapy; Received Radiation Psychiatric Complaints and Symptoms: No Complaints or Symptoms Medical History: Negative for: Anorexia/bulimia; Confinement Anxiety Immunizations Pneumococcal Vaccine: Received Pneumococcal Vaccination: No Tetanus Vaccine: Last tetanus shot: 06/28/2011 [name], [name] ([id number]) Implantable Devices Hospitalization / Surgery History Name of Hospital Purpose of Hospitalization/Surgery Date [place] cellulitis 06/28/2003 [facility] [place] cellulitis 07/27/2005 [facility] hip replacement 07/28/2003 [facility] ED cellulitis 06/30/2014 Family and Social History Cancer: Yes - Mother; Diabetes: Yes - Mother; Heart Disease: Yes - Maternal Grandparents; Hereditary Spherocytosis: No; Hypertension: No; Kidney Disease: No; Lung Disease: No; Seizures: No; Stroke: No; Thyroid Problems: No; Tuberculosis: No; Former smoker;  Marital Status - Married; Alcohol Use: Rarely; Drug Use: No History; Caffeine Use: Moderate; Financial Concerns: No; Food, Clothing or Shelter Needs: No; Support System Lacking: No; Transportation Concerns: No; Advanced Directives: Yes (Not Provided); Patient does not want information on Advanced Directives; Living Will: Yes (Not Provided); Medical Power of Attorney: Yes (Not Provided) Physician Affirmation I have reviewed and agree with the above  information. Electronic Signature(s) Signed: 11/13/2017 5:23:22 PM By: Curtis Sites Signed: 11/19/2017 1:45:00 AM By: Lenda Kelp PA-C Entered By: Lenda Kelp on 11/13/2017 11:52:31 [name], [name] ([id number]) -------------------------------------------------------------------------------- SuperBill Details Patient Name: [name]. Date of Service: 11/13/2017 Medical Record Number: [id number] Patient Account Number: [id number] Date of Birth/Sex: July 22, 1945 (72 y.o. M) Treating RN: Curtis Sites Primary Care Provider: Etheleen Nicks Other Clinician: Referring Provider: Etheleen Nicks Treating Provider/Extender: Linwood Dibbles, HOYT Weeks in Treatment: 2 Diagnosis Coding ICD-10 Codes Code Description I87.2 Venous insufficiency (chronic) (peripheral) I89.0 Lymphedema, not elsewhere classified L97.312 Non-pressure chronic ulcer of right ankle with fat layer exposed E66.01 Morbid (severe) obesity due to excess calories Facility Procedures CPT4 Code: 29528413 Description: 11042 - DEB SUBQ TISSUE 20 SQ CM/< ICD-10 Diagnosis Description L97.312 Non-pressure chronic ulcer of right ankle with fat layer ex Modifier: posed Quantity: 1 Physician Procedures CPT4 Code: [id number] Description: 11042 - WC PHYS SUBQ TISS 20 SQ CM ICD-10 Diagnosis Description L97.312 Non-pressure chronic ulcer of right ankle with fat layer ex Modifier: posed Quantity: 1 Electronic Signature(s) Signed: 11/19/2017 1:45:00 AM By: Lenda Kelp PA-C Entered By: Lenda Kelp on 11/13/2017 11:54:24

## 2017-11-22 NOTE — Progress Notes (Signed)
[name], [name] ([id number]) Visit Report for 11/20/2017 Chief Complaint Document Details Patient Name: [name], [name]. Date of Service: 11/20/2017 1:30 PM Medical Record Number: [id number] Patient Account Number: [id number] Date of Birth/Sex: 26-Dec-1945 (72 y.o. M) Treating RN: [name] Primary Care Provider: Etheleen [name] Other Clinician: Referring Provider: Etheleen [name] Treating Provider/Extender: [name], [name] in Treatment: 3 Information Obtained from: Patient Chief Complaint Right lateral ankle ulcer Electronic Signature(s) Signed: 11/20/2017 6:09:48 PM By: [name] Kelp PA-C Entered By: [name] on 11/20/2017 14:04:51 [name], [name] ([id number]) -------------------------------------------------------------------------------- HPI Details Patient Name: [name]. Date of Service: 11/20/2017 1:30 PM Medical Record Number: [id number] Patient Account Number: [id number] Date of Birth/Sex: [birth year] (72 y.o. M) Treating RN: [name] Primary Care Provider: Etheleen [name] Other Clinician: Referring Provider: Etheleen [name] Treating Provider/Extender: [name], [name] [name] in Treatment: 3 History of Present Illness Location: right lower extremity Quality: Patient has been experiencing much less pain currently Severity: 1 out of 10 Duration: Patient has had the wound for approximately 3 [name] prior to presenting for treatment Timing: Pain occurs most often with manipulation of the wound Context: Patient dropped a cardboard box which struck his leg causing the initial injury which hasn't healed since onset Modifying Factors: Currently patient has been using over-the-counter antibiotic ointment Associated Signs and Symptoms: Patient reports having difficulty standing for long periods. HPI Description: As noted above the patient had a injury due to working in the yard and this rapidly progressed to a ulcerated area with an infection. In October  2014 he did have some vascular studies done but never saw a Careers adviser and never had any surgery for varicose veins. His past medical history is significant for hypertension, obstructive sleep apnea, morbid obesity, stasis dermatitis of both lower extremities and his past surgical history is suggestive of hip surgery on the right, gastric bypass surgery, cholecystectomy, hip replacement. He has recently been put on Levaquin 500 milligrams daily, Bactrim DS 1 twice a day and Bactroban 2% topical ointment locally. No recent labs or vascular or imaging studies done. 07/11/2014 a venous duplex study was done on 07/05/2014 -- it showed no evidence of deep vein thrombosis or superficial thrombosis bilaterally. 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 The patient has an appointment to see the vascular surgeons on June 20. 07/18/2014 his appointment with the vascular surgeons is rescheduled for this afternoon. 07/25/2014 -- he saw the vascular surgeon and is going to have a endovenous procedure done on July 29. 08/01/2014 -- he was diagnosed with a UTI and is on ciprofloxacin for 10 days. readmission: 11/27/15 On evaluation today patient is having discomfort in the right ankle region following an open wound from having dropped a cardboard box striking his ankle. He tells me that this has not healed despite many of the measures that he learned from previous injuries when he was seen at our wound center. Therefore he didn't come in for reevaluation today. He has not noted any significant purulent discharge at this point in time. 12/04/15 patient appears to be doing somewhat better at this point in time today in regard to his ankle wound. it is measuring smaller and looking cleaner. 12/11/15; patient wound continues to look improve this is on the right lateral ankle. Debrided of surface slough and nonviable subcutaneous tissue. Using  silver alginate 12-18-15 [name] presents today with his wife for follow-up on right lower extremity venous ulcer. He denies  any changes or complications over the past week. He is tolerating compression therapy to his right lower extremity and has a properly fitting compression hose to the left lower extremity. 12/25/15; patient with a wound on his right lateral lower extremity in the setting of severe venous insufficiency [originally trauma against the box]. His edema is well controlled he is using Prisma. 01/01/16; continued improvement using Prisma. He has severe venous insufficiency however the wound was originally trauma against a box. 01/08/16; his wound is totally epithelialized. We will give him measurements for graded pressure stockings which I think he is going to get at elastic therapy in [facility] [place]. ([id number]) Readmission: 10/30/17 patient presents today for reevaluation in our clinic although it has been since 2017 that we've last seen him. He has a ulcer on the right lateral ankle which he tells me began roughly 2 months ago. He states that he had one similar on the left ankle although this completely healed without any complication. However the one on the right as continue to give him a lot of trouble and is not healing appropriately. He really was not sure what the best thing to put on the area would be and therefore he's just been putting advantage to cover it. Another treatment has been utilized at this time. Fortunately there is not appear to be any evidence of significant infection necessarily although there is some evidence of abnormal discoloration in regard to the base of the wound that does not look as healthy as I was on the lives life. He does have erythema surrounding but I'm not sure if this is just due to chronic venous stasis/lymphedema or if it truly is anything infection wise going on at this point. Nonetheless in general the patient  seems to be doing fairly well he's not having too much pain although he does have some discomfort. No fevers, chills, nausea, or vomiting noted at this time. 11/13/17 on evaluation today patient actually appears to be doing much better in regard to his wound currently. There does seem to be evidence of good improvement there still a lot of maceration and some necrotic tissue on the surface of the wound but this was cleaned away very well today. Overall I'm very pleased with how things seem to be progressing in general. We did get the results of his culture back which showed that he did have evidence of Staphylococcus aureus as well as group B strep. It does appear that the Bactrim was sensitive for both. The wound again does look better. 11/20/17 on evaluation today patient's right lateral ankle ulcer actually appears to be doing excellent at this time. He has been tolerating the dressing changes without complication. With that being said overall I do feel like that he has made great progress in the few [name] we've been seeing him at this point. No fevers, chills, nausea, or vomiting noted at this time. Electronic Signature(s) Signed: 11/20/2017 6:09:48 PM By: [name] Kelp PA-C Entered By: [name] on 11/20/2017 14:34:35 Biglow, [name] ([id number]) -------------------------------------------------------------------------------- Physical Exam Details Patient Name: [name]. Date of Service: 11/20/2017 1:30 PM Medical Record Number: [id number] Patient Account Number: [id number] Date of Birth/Sex: [birth year] (72 y.o. M) Treating RN: [name] Primary Care Provider: Etheleen [name] Other Clinician: Referring Provider: Etheleen [name] Treating Provider/Extender: [name], Jovonda Selner [name] in Treatment: 3 Constitutional Obese and well-hydrated in no acute distress. Respiratory normal breathing without difficulty. clear to auscultation bilaterally. Cardiovascular regular rate  and  rhythm with normal S1, S2. Psychiatric this patient is able to make decisions and demonstrates good insight into disease process. Alert and Oriented x 3. pleasant and cooperative. Notes Patient's wound bed currently shows evidence of good granulation he did have some Slough on the surface the wound although I was able to mechanically debride this away with saline and gauze. He tolerated this without complication post debridement wound bed appears to be doing significantly better. Overall I'm very happy with how things have been progressing. Electronic Signature(s) Signed: 11/20/2017 6:09:48 PM By: [name] Kelp PA-C Entered By: [name] on 11/20/2017 14:35:16 [name], [name] ([id number]) -------------------------------------------------------------------------------- Physician Orders Details Patient Name: [name]. Date of Service: 11/20/2017 1:30 PM Medical Record Number: [id number] Patient Account Number: [id number] Date of Birth/Sex: 1945-12-16 (72 y.o. M) Treating RN: [name] Primary Care Provider: Etheleen [name] Other Clinician: Referring Provider: Etheleen [name] Treating Provider/Extender: [name], Harlan Ervine [name] in Treatment: 3 Verbal / Phone Orders: No Diagnosis Coding ICD-10 Coding Code Description I87.2 Venous insufficiency (chronic) (peripheral) I89.0 Lymphedema, not elsewhere classified L97.312 Non-pressure chronic ulcer of right ankle with fat layer exposed E66.01 Morbid (severe) obesity due to excess calories Wound Cleansing Wound #6 Right,Lateral Malleolus o Clean wound with Normal Saline. o May Shower, gently pat wound dry prior to applying new dressing. Anesthetic (add to Medication List) Wound #6 Right,Lateral Malleolus o Topical Lidocaine 4% cream applied to wound bed prior to debridement (In Clinic Only). Primary Wound Dressing Wound #6 Right,Lateral Malleolus o Iodoflex Secondary Dressing Wound #6 Right,Lateral  Malleolus o ABD pad o Other - KerraMax, Carboflex Dressing Change Frequency Wound #6 Right,Lateral Malleolus o Change dressing every week Follow-up Appointments Wound #6 Right,Lateral Malleolus o Return Appointment in 1 week. o Nurse Visit as needed - [name] may call for wrap change if needed Edema Control Wound #6 Right,Lateral Malleolus o 3 Layer Compression System - Right Lower Extremity Additional Orders / Instructions Wound #6 Right,Lateral Malleolus Akerley, Johnjoseph R. ([id number]) o Increase protein intake. o Activity as tolerated Electronic Signature(s) Signed: 11/20/2017 5:35:44 PM By: [name] Signed: 11/20/2017 6:09:48 PM By: [name] Kelp PA-C Entered By: [name] on 11/20/2017 14:34:05 [name], [name] ([id number]) -------------------------------------------------------------------------------- Problem List Details Patient Name: [name]. Date of Service: 11/20/2017 1:30 PM Medical Record Number: [id number] Patient Account Number: [id number] Date of Birth/Sex: [birth year] (72 y.o. M) Treating RN: [name] Primary Care Provider: Etheleen [name] Other Clinician: Referring Provider: Etheleen [name] Treating Provider/Extender: [name], Eann Cleland [name] in Treatment: 3 Active Problems ICD-10 Evaluated Encounter Code Description Active Date Today Diagnosis I87.2 Venous insufficiency (chronic) (peripheral) 10/30/2017 No Yes I89.0 Lymphedema, not elsewhere classified 10/30/2017 No Yes L97.312 Non-pressure chronic ulcer of right ankle with fat layer 10/30/2017 No Yes exposed E66.01 Morbid (severe) obesity due to excess calories 10/30/2017 No Yes Inactive Problems Resolved Problems Electronic Signature(s) Signed: 11/20/2017 6:09:48 PM By: [name] Kelp PA-C Entered By: [name] on 11/20/2017 14:04:46 [name], [name] ([id number]) -------------------------------------------------------------------------------- Progress Note  Details Patient Name: [name]. Date of Service: 11/20/2017 1:30 PM Medical Record Number: [id number] Patient Account Number: [id number] Date of Birth/Sex: 1945/12/09 (72 y.o. M) Treating RN: [name] Primary Care Provider: Etheleen [name] Other Clinician: Referring Provider: Etheleen [name] Treating Provider/Extender: [name], Tabitha Riggins [name] in Treatment: 3 Subjective Chief Complaint Information obtained from Patient Right lateral ankle ulcer History of Present Illness (HPI) The following HPI elements were documented for the patient's wound: Location: right lower extremity Quality: Patient has been  experiencing much less pain currently Severity: 1 out of 10 Duration: Patient has had the wound for approximately 3 [name] prior to presenting for treatment Timing: Pain occurs most often with manipulation of the wound Context: Patient dropped a cardboard box which struck his leg causing the initial injury which hasn't healed since onset Modifying Factors: Currently patient has been using over-the-counter antibiotic ointment Associated Signs and Symptoms: Patient reports having difficulty standing for long periods. As noted above the patient had a injury due to working in the yard and this rapidly progressed to a ulcerated area with an infection. In October 2014 he did have some vascular studies done but never saw a Careers adviser and never had any surgery for varicose veins. His past medical history is significant for hypertension, obstructive sleep apnea, morbid obesity, stasis dermatitis of both lower extremities and his past surgical history is suggestive of hip surgery on the right, gastric bypass surgery, cholecystectomy, hip replacement. He has recently been put on Levaquin 500 milligrams daily, Bactrim DS 1 twice a day and Bactroban 2% topical ointment locally. No recent labs or vascular or imaging studies done. 07/11/2014 a venous duplex study was done on 07/05/2014 -- it showed  no evidence of deep vein thrombosis or superficial thrombosis bilaterally. 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 The patient has an appointment to see the vascular surgeons on June 20. 07/18/2014 his appointment with the vascular surgeons is rescheduled for this afternoon. 07/25/2014 -- he saw the vascular surgeon and is going to have a endovenous procedure done on July 29. 08/01/2014 -- he was diagnosed with a UTI and is on ciprofloxacin for 10 days. readmission: 11/27/15 On evaluation today patient is having discomfort in the right ankle region following an open wound from having dropped a cardboard box striking his ankle. He tells me that this has not healed despite many of the measures that he learned from previous injuries when he was seen at our wound center. Therefore he didn't come in for reevaluation today. He has not noted any significant purulent discharge at this point in time. 12/04/15 patient appears to be doing somewhat better at this point in time today in regard to his ankle wound. it is measuring smaller and looking cleaner. 12/11/15; patient wound continues to look improve this is on the right lateral ankle. Debrided of surface slough and nonviable subcutaneous tissue. Using silver alginate 12-18-15 [name] presents today with his wife for follow-up on right lower extremity venous ulcer. He denies any changes or complications over the past week. He is tolerating compression therapy to his right lower extremity and has a properly Furlough, Geno R. ([id number]) fitting compression hose to the left lower extremity. 12/25/15; patient with a wound on his right lateral lower extremity in the setting of severe venous insufficiency [originally trauma against the box]. His edema is well controlled he is using Prisma. 01/01/16; continued improvement using Prisma. He has severe venous insufficiency however the  wound was originally trauma against a box. 01/08/16; his wound is totally epithelialized. We will give him measurements for graded pressure stockings which I think he is going to get at elastic therapy in [facility] Readmission: 10/30/17 patient presents today for reevaluation in our clinic although it has been since 2017 that we've last seen him. He has a ulcer on the right lateral ankle which he tells me began roughly 2 months ago. He states that he had one similar on  the left ankle although this completely healed without any complication. However the one on the right as continue to give him a lot of trouble and is not healing appropriately. He really was not sure what the best thing to put on the area would be and therefore he's just been putting advantage to cover it. Another treatment has been utilized at this time. Fortunately there is not appear to be any evidence of significant infection necessarily although there is some evidence of abnormal discoloration in regard to the base of the wound that does not look as healthy as I was on the lives life. He does have erythema surrounding but I'm not sure if this is just due to chronic venous stasis/lymphedema or if it truly is anything infection wise going on at this point. Nonetheless in general the patient seems to be doing fairly well he's not having too much pain although he does have some discomfort. No fevers, chills, nausea, or vomiting noted at this time. 11/13/17 on evaluation today patient actually appears to be doing much better in regard to his wound currently. There does seem to be evidence of good improvement there still a lot of maceration and some necrotic tissue on the surface of the wound but this was cleaned away very well today. Overall I'm very pleased with how things seem to be progressing in general. We did get the results of his culture back which showed that he did have evidence of Staphylococcus aureus as well  as group B strep. It does appear that the Bactrim was sensitive for both. The wound again does look better. 11/20/17 on evaluation today patient's right lateral ankle ulcer actually appears to be doing excellent at this time. He has been tolerating the dressing changes without complication. With that being said overall I do feel like that he has made great progress in the few [name] we've been seeing him at this point. No fevers, chills, nausea, or vomiting noted at this time. Patient History Information obtained from Patient. Family History Cancer - Mother, Diabetes - Mother, Heart Disease - Maternal Grandparents, No family history of Hereditary Spherocytosis, Hypertension, Kidney Disease, Lung Disease, Seizures, Stroke, Thyroid Problems, Tuberculosis. Social History Former smoker, Marital Status - Married, Alcohol Use - Rarely, Drug Use - No History, Caffeine Use - Moderate. Medical History Hospitalization/Surgery History - 06/28/2003, [place], cellulitis. - 07/27/2005, [facility] [place], cellulitis. - 07/28/2003, [facility], hip replacement. - 06/30/2014, [facility], cellulitis. 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 Respiratory The patient has no complaints or symptoms. Cardiovascular Complains or has symptoms of LE edema. Psychiatric [name], [name] ([id number]) The patient has no complaints or symptoms. Objective Constitutional Obese and well-hydrated in no acute distress. Vitals Time Taken: 1:45 PM, Temperature: 97.8 F, Pulse: 78 bpm, Respiratory Rate: 18 breaths/min, Blood Pressure: 116/62 mmHg. Respiratory normal breathing without difficulty. clear to auscultation bilaterally. Cardiovascular regular rate and rhythm with normal S1, S2. Psychiatric this patient is able to make decisions and demonstrates good insight into  disease process. Alert and Oriented x 3. pleasant and cooperative. General Notes: Patient's wound bed currently shows evidence of good granulation he did have some Slough on the surface the wound although I was able to mechanically debride this away with saline and gauze. He tolerated this without complication post debridement wound bed appears to be doing significantly better. Overall I'm very happy with  how things have been progressing. Integumentary (Hair, Skin) Wound #6 status is Open. Original cause of wound was Gradually Appeared. The wound is located on the Right,Lateral Malleolus. The wound measures 3.2cm length x 2.2cm width x 0.2cm depth; 5.529cm^2 area and 1.106cm^3 volume. There is Fat Layer (Subcutaneous Tissue) Exposed exposed. There is no tunneling or undermining noted. There is a medium amount of serous drainage noted. The wound margin is flat and intact. There is medium (34-66%) red, pink granulation within the wound bed. There is a medium (34-66%) amount of necrotic tissue within the wound bed including Eschar and Adherent Slough. The periwound skin appearance exhibited: Ecchymosis, Hemosiderin Staining. The periwound skin appearance did not exhibit: Callus, Crepitus, Excoriation, Induration, Rash, Scarring, Dry/Scaly, Maceration, Atrophie Blanche, Cyanosis, Mottled, Pallor, Rubor, Erythema. Periwound temperature was noted as No Abnormality. Assessment Active Problems ICD-10 Venous insufficiency (chronic) (peripheral) Lymphedema, not elsewhere classified Non-pressure chronic ulcer of right ankle with fat layer exposed Morbid (severe) obesity due to excess calories [name], [name] ([id number]) Procedures Wound #6 Pre-procedure diagnosis of Wound #6 is a Lymphedema located on the Right,Lateral Malleolus . There was a Three Layer Compression Therapy Procedure with a pre-treatment ABI of 1.3 by [name] Sites, RN. Post procedure Diagnosis Wound #6: Same as  Pre-Procedure Plan Wound Cleansing: Wound #6 Right,Lateral Malleolus: Clean wound with Normal Saline. May Shower, gently pat wound dry prior to applying new dressing. Anesthetic (add to Medication List): Wound #6 Right,Lateral Malleolus: Topical Lidocaine 4% cream applied to wound bed prior to debridement (In Clinic Only). Primary Wound Dressing: Wound #6 Right,Lateral Malleolus: Iodoflex Secondary Dressing: Wound #6 Right,Lateral Malleolus: ABD pad Other - KerraMax, Carboflex Dressing Change Frequency: Wound #6 Right,Lateral Malleolus: Change dressing every week Follow-up Appointments: Wound #6 Right,Lateral Malleolus: Return Appointment in 1 week. Nurse Visit as needed - [name] may call for wrap change if needed Edema Control: Wound #6 Right,Lateral Malleolus: 3 Layer Compression System - Right Lower Extremity Additional Orders / Instructions: Wound #6 Right,Lateral Malleolus: Increase protein intake. Activity as tolerated Since he is doing so well we will continue with the above wound care measures for the next week. If anything changes or worsens meantime the patient will contact our office for additional recommendations. Otherwise it was a pleasure seeing him today I'm glad he is doing better. Please see above for specific wound care orders. We will see patient for re-evaluation in 1 week(s) here in the clinic. If anything worsens or changes patient will contact our office for additional recommendations. [name], [name] ([id number]) Electronic Signature(s) Signed: 11/20/2017 6:09:48 PM By: [name] Kelp PA-C Entered By: [name] on 11/20/2017 14:35:37 [name], [name] ([id number]) -------------------------------------------------------------------------------- ROS/PFSH Details Patient Name: [name]. Date of Service: 11/20/2017 1:30 PM Medical Record Number: [id number] Patient Account Number: [id number] Date of Birth/Sex: 12-08-1945 (72 y.o.  M) Treating RN: [name] Primary Care Provider: Etheleen [name] Other Clinician: Referring Provider: Etheleen [name] Treating Provider/Extender: [name], Huldah Marin [name] in Treatment: 3 Information Obtained From Patient Wound History Do you currently have one or more open woundso Yes How many open wounds do you currently haveo 1 Approximately how long have you had your woundso 1 month + How have you been treating your wound(s) until nowo bandaid Has your wound(s) ever healed and then re-openedo No Have you had any lab work done in the past montho No Have you tested positive for an antibiotic resistant organism (MRSA, VRE)o No Have you tested positive for osteomyelitis (bone infection)o No Have  you had any tests for circulation on your legso No Constitutional Symptoms (General Health) Complaints and Symptoms: Negative for: Fever; Chills Cardiovascular Complaints and Symptoms: Positive for: LE edema Medical History: Positive for: Hypertension Negative for: Angina; Arrhythmia; Congestive Heart Failure; Coronary Artery Disease; Deep Vein Thrombosis; Hypotension; Myocardial Infarction; Peripheral Arterial Disease; Peripheral Venous Disease; Phlebitis; Vasculitis Eyes Medical History: Negative for: Cataracts; Glaucoma; Optic Neuritis Ear/Nose/Mouth/Throat Medical History: Negative for: Chronic sinus problems/congestion; Middle ear problems Hematologic/Lymphatic Medical History: Positive for: Anemia - iron pills Negative for: Hemophilia; Human Immunodeficiency Virus; Lymphedema; Sickle Cell Disease Respiratory Complaints and Symptoms: No Complaints or Symptoms Medical History: [name], KEENUM. ([id number]) Positive for: Sleep Apnea - C-pap and oxygen 1.5L at night Negative for: Aspiration; Asthma; Chronic Obstructive Pulmonary Disease (COPD); Pneumothorax; Tuberculosis Gastrointestinal Medical History: Negative for: Cirrhosis ; Colitis; Crohnos; Hepatitis A; Hepatitis B;  Hepatitis C Endocrine Medical History: Negative for: Type I Diabetes; Type II Diabetes Genitourinary Medical History: Negative for: End Stage Renal Disease Immunological Medical History: Negative for: Lupus Erythematosus; Raynaudos; Scleroderma Integumentary (Skin) Medical History: Negative for: History of Burn; History of pressure wounds Musculoskeletal Medical History: Positive for: Osteoarthritis - left hip Negative for: Gout; Rheumatoid Arthritis; Osteomyelitis Past Medical History Notes: has artificial right hip and right shoulder Neurologic Medical History: Negative for: Dementia; Neuropathy; Quadriplegia; Paraplegia; Seizure Disorder Oncologic Medical History: Negative for: Received Chemotherapy; Received Radiation Psychiatric Complaints and Symptoms: No Complaints or Symptoms Medical History: Negative for: Anorexia/bulimia; Confinement Anxiety Immunizations Pneumococcal Vaccine: Received Pneumococcal Vaccination: No Tetanus Vaccine: Last tetanus shot: 06/28/2011 [name], [name] ([id number]) Implantable Devices Hospitalization / Surgery History Name of Hospital Purpose of Hospitalization/Surgery Date [place] cellulitis 06/28/2003 [facility] [place] cell[facility]itis 07/27/2005 [facility] hip replacement 07/28/2003 [facility] ED cellulitis 06/30/2014 Family and Social History Cancer: Yes - Mother; Diabetes: Yes - Mother; Heart Disease: Yes - Maternal Grandparents; Hereditary Spherocytosis: No; Hypertension: No; Kidney Disease: No; Lung Disease: No; Seizures: No; Stroke: No; Thyroid Problems: No; Tuberculosis: No; Former smoker; Marital Status - Married; Alcohol Use: Rarely; Drug Use: No History; Caffeine Use: Moderate; Financial Concerns: No; Food, Clothing or Shelter Needs: No; Support System Lacking: No; Transportation Concerns: No; Advanced Directives: Yes (Not Provided); Patient does not want information on Advanced Directives; Living Will: Yes (Not Provided); Medical Power of Attorney:  Yes (Not Provided) Physician Affirmation I have reviewed and agree with the above information. Electronic Signature(s) Signed: 11/20/2017 5:35:44 PM By: [name] Signed: 11/20/2017 6:09:48 PM By: [name] Kelp PA-C Entered By: [name] on 11/20/2017 14:35:00 Braddy, [name] ([id number]) -------------------------------------------------------------------------------- SuperBill Details Patient Name: [name]. Date of Service: 11/20/2017 Medical Record Number: [id number] Patient Account Number: [id number] Date of Birth/Sex: [birth year] (72 y.o. M) Treating RN: [name] Primary Care Provider: Etheleen [name] Other Clinician: Referring Provider: Etheleen [name] Treating Provider/Extender: [name], Richardine Peppers [name] in Treatment: 3 Diagnosis Coding ICD-10 Codes Code Description I87.2 Venous insufficiency (chronic) (peripheral) I89.0 Lymphedema, not elsewhere classified L97.312 Non-pressure chronic ulcer of right ankle with fat layer exposed E66.01 Morbid (severe) obesity due to excess calories Physician Procedures CPT4 Code: 2440102 Description: 99214 - WC PHYS LEVEL 4 - EST PT ICD-10 Diagnosis Description I87.2 Venous insufficiency (chronic) (peripheral) I89.0 Lymphedema, not elsewhere classified 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

## 2017-11-23 ENCOUNTER — Ambulatory Visit (INDEPENDENT_AMBULATORY_CARE_PROVIDER_SITE_OTHER): Payer: Medicare Other | Admitting: Family Medicine

## 2017-11-23 DIAGNOSIS — R339 Retention of urine, unspecified: Secondary | ICD-10-CM

## 2017-11-23 NOTE — Progress Notes (Signed)
Cath Change/ Replacement  Patient is present today for a catheter change due to urinary retention.  10ml of water was removed from the balloon, a 16FR foley cath was removed with out difficulty.  Patient was cleaned and prepped in a sterile fashion with betadine and 2% lidocaine jelly was instilled into the urethra. A 16 FR foley cath was replaced into the bladder no complications were noted Urine return was noted 30ml and urine was yellow in color. The balloon was filled with 10ml of sterile water. A leg bag with an extender tube was attached for drainage. Patient was given proper instruction on catheter care.    Preformed by: Teressa Lower, CMA  Follow up: 1 month SPT change

## 2017-11-24 NOTE — Progress Notes (Signed)
ALEGANDRO, MACNAUGHTON ([id number]) Visit Report for 11/20/2017 Arrival Information Details Patient Name: [name], [name]. Date of [name]: 11/20/2017 1:30 PM Medical Record Number: [id number] Patient Account Number: [id number] Date of Birth/Sex: 09-08-1945 (72 y.o. M) Treating RN: Huel Coventry Primary Care Garvin Ellena: Etheleen Nicks Other Clinician: Referring Kemper Heupel: Etheleen Nicks Treating Destiny Trickey/Extender: Linwood Dibbles, HOYT Weeks in Treatment: 3 Visit Information History Since Last Visit Added or deleted any medications: No Patient Arrived: Cane Any new allergies or adverse reactions: No Arrival Time: 13:44 Had a fall or experienced change in No Accompanied By: wife activities of daily living that may affect Transfer Assistance: None risk of falls: Patient Identification Verified: Yes Signs or symptoms of abuse/neglect since last visito No Secondary Verification Process Yes Hospitalized since last visit: No Completed: Implantable device outside of the clinic excluding No Patient Has Alerts: Yes cellular tissue based products placed in the center Patient Alerts: Hgb a1c 5.3 (4-27- since last visit: 19) Has Dressing in Place as Prescribed: Yes Has Compression in Place as Prescribed: Yes Pain Present Now: Yes Electronic Signature(s) Signed: 11/23/2017 3:24:13 PM By: Elliot Gurney, BSN, RN, CWS, Kim RN, BSN Entered By: Elliot Gurney, BSN, RN, CWS, Kim on 11/20/2017 13:45:18 [name], [name] ([id number]) -------------------------------------------------------------------------------- Compression Therapy Details Patient Name: [name] Pen R. Date of [name]: 11/20/2017 1:30 PM Medical Record Number: [id number] Patient Account Number: [id number] Date of Birth/Sex: 15-Jun-1945 (71 y.o. M) Treating RN: Curtis Sites Primary Care Cameron Katayama: Etheleen Nicks Other Clinician: Referring Nahiem Dredge: Etheleen Nicks Treating Murl Golladay/Extender: Linwood Dibbles, HOYT Weeks in Treatment: 3 Compression Therapy Performed  for Wound Assessment: Wound #6 Right,Lateral Malleolus Performed By: Clinician Curtis Sites, RN Compression Type: Three Layer Pre Treatment ABI: 1.3 Post Procedure Diagnosis Same as Pre-procedure Electronic Signature(s) Signed: 11/20/2017 5:35:44 PM By: Curtis Sites Entered By: Curtis Sites on 11/20/2017 14:31:01 Mitrano, [name] ([id number]) -------------------------------------------------------------------------------- Encounter Discharge Information Details Patient Name: [name] Alma. Date of [name]: 11/20/2017 1:30 PM Medical Record Number: [id number] Patient Account Number: [id number] Date of Birth/Sex: [birth year] (72 y.o. M) Treating RN: Curtis Sites Primary Care Meghna Hagmann: Etheleen Nicks Other Clinician: Referring Jillienne Egner: Etheleen Nicks Treating Ligia Duguay/Extender: Linwood Dibbles, HOYT Weeks in Treatment: 3 Encounter Discharge Information Items Discharge Condition: Stable Ambulatory Status: Cane Discharge Destination: Home Transportation: Private Auto Accompanied By: spouse Schedule Follow-up Appointment: Yes Clinical Summary of Care: Electronic Signature(s) Signed: 11/20/2017 3:03:41 PM By: Curtis Sites Entered By: Curtis Sites on 11/20/2017 15:03:41 [name], [name] ([id number]) -------------------------------------------------------------------------------- Lower Extremity Assessment Details Patient Name: [name] Alma. Date of [name]: 11/20/2017 1:30 PM Medical Record Number: [id number] Patient Account Number: [id number] Date of Birth/Sex: 12-Nov-1945 (72 y.o. M) Treating RN: Huel Coventry Primary Care Corynne Scibilia: Etheleen Nicks Other Clinician: Referring Brannon Decaire: Etheleen Nicks Treating Tyrae Alcoser/Extender: Linwood Dibbles, HOYT Weeks in Treatment: 3 Edema Assessment Assessed: [Left: No] [Right: No] [Left: Edema] [Right: :] Calf Left: Right: Point of Measurement: 35 cm From Medial Instep cm 41 cm Ankle Left: Right: Point of Measurement: 10 cm From Medial  Instep cm 26.5 cm Vascular Assessment Pulses: Dorsalis Pedis Palpable: [Right:Yes] Posterior Tibial Palpable: [Right:Yes] Extremity colors, hair growth, and conditions: Extremity Color: [Right:Hyperpigmented] Hair Growth on Extremity: [Right:No] Temperature of Extremity: [Right:Warm] Capillary Refill: [Right:< 3 seconds] Toe Nail Assessment Left: Right: Thick: Yes Discolored: No Deformed: Yes Improper Length and Hygiene: Yes Electronic Signature(s) Signed: 11/23/2017 3:24:13 PM By: Elliot Gurney, BSN, RN, CWS, Kim RN, BSN Entered By: Elliot Gurney, BSN, RN, CWS, Kim on 11/20/2017 13:55:41 [name], [name] ([id number]) -------------------------------------------------------------------------------- Multi Wound Chart Details Patient Name: [name] Alma. Date  of [name]: 11/20/2017 1:30 PM Medical Record Number: [id number] Patient Account Number: [id number] Date of Birth/Sex: [birth year] (73 y.o. M) Treating RN: Curtis Sites Primary Care Zaiah Credeur: Etheleen Nicks Other Clinician: Referring Kee Drudge: Etheleen Nicks Treating Cora Stetson/Extender: Linwood Dibbles, HOYT Weeks in Treatment: 3 Vital Signs Height(in): Pulse(bpm): 78 Weight(lbs): Blood Pressure(mmHg): 116/62 Body Mass Index(BMI): Temperature(F): 97.8 Respiratory Rate 18 (breaths/min): Photos: [6:No Photos] [N/A:N/A] Wound Location: [6:Right Malleolus - Lateral] [N/A:N/A] Wounding Event: [6:Gradually Appeared] [N/A:N/A] Primary Etiology: [6:Lymphedema] [N/A:N/A] Comorbid History: [6:Anemia, Sleep Apnea, Hypertension, Osteoarthritis] [N/A:N/A] Date Acquired: [6:09/27/2017] [N/A:N/A] Weeks of Treatment: [6:3] [N/A:N/A] Wound Status: [6:Open] [N/A:N/A] Measurements L x W x D [6:3.2x2.2x0.2] [N/A:N/A] (cm) Area (cm) : [6:5.529] [N/A:N/A] Volume (cm) : [6:1.106] [N/A:N/A] % Reduction in Area: [6:-25.70%] [N/A:N/A] % Reduction in Volume: [6:-25.70%] [N/A:N/A] Classification: [6:Full Thickness Without Exposed Support Structures]  [N/A:N/A] Exudate Amount: [6:Medium] [N/A:N/A] Exudate Type: [6:Serous] [N/A:N/A] Exudate Color: [6:amber] [N/A:N/A] Wound Margin: [6:Flat and Intact] [N/A:N/A] Granulation Amount: [6:Medium (34-66%)] [N/A:N/A] Granulation Quality: [6:Red, Pink] [N/A:N/A] Necrotic Amount: [6:Medium (34-66%)] [N/A:N/A] Necrotic Tissue: [6:Eschar, Adherent Slough] [N/A:N/A] Exposed Structures: [6:Fat Layer (Subcutaneous Tissue) Exposed: Yes Fascia: No Tendon: No Muscle: No Joint: No Bone: No] [N/A:N/A] Epithelialization: [6:Small (1-33%)] [N/A:N/A] Periwound Skin Texture: [6:Excoriation: No Induration: No Callus: No Crepitus: No] [N/A:N/A] Rash: No Scarring: No Periwound Skin Moisture: Maceration: No N/A N/A Dry/Scaly: No Periwound Skin Color: Ecchymosis: Yes N/A N/A Hemosiderin Staining: Yes Atrophie Blanche: No Cyanosis: No Erythema: No Mottled: No Pallor: No Rubor: No Temperature: No Abnormality N/A N/A Tenderness on Palpation: No N/A N/A Wound Preparation: Ulcer Cleansing: N/A N/A Rinsed/Irrigated with Saline Topical Anesthetic Applied: Other: lidocaine 4% Treatment Notes Electronic Signature(s) Signed: 11/20/2017 5:35:44 PM By: Curtis Sites Entered By: Curtis Sites on 11/20/2017 14:27:49 [name], [name] ([id number]) -------------------------------------------------------------------------------- Multi-Disciplinary Care Plan Details Patient Name: [name] Alma. Date of [name]: 11/20/2017 1:30 PM Medical Record Number: [id number] Patient Account Number: [id number] Date of Birth/Sex: Jul 21, 1945 (72 y.o. M) Treating RN: Curtis Sites Primary Care Haeli Gerlich: Etheleen Nicks Other Clinician: Referring Boleslaus Holloway: Etheleen Nicks Treating Demani Weyrauch/Extender: Linwood Dibbles, HOYT Weeks in Treatment: 3 Active Inactive ` Abuse / Safety / Falls / Self Care Management Nursing Diagnoses: Impaired physical mobility Goals: Patient will not develop complications from immobility Date  Initiated: 10/30/2017 Target Resolution Date: 01/22/2018 Goal Status: Active Interventions: Assess fall risk on admission and as needed Notes: ` Orientation to the Wound Care Program Nursing Diagnoses: Knowledge deficit related to the wound healing center program Goals: Patient/caregiver will verbalize understanding of the Wound Healing Center Program Date Initiated: 10/30/2017 Target Resolution Date: 01/22/2018 Goal Status: Active Interventions: Provide education on orientation to the wound center Notes: ` Venous Leg Ulcer Nursing Diagnoses: Potential for venous Insuffiency (use before diagnosis confirmed) Goals: Patient will maintain optimal edema control Date Initiated: 10/30/2017 Target Resolution Date: 01/22/2018 Goal Status: Active Interventions: [name], [name] ([id number]) Assess peripheral edema status every visit. Compression as ordered Notes: ` Wound/Skin Impairment Nursing Diagnoses: Impaired tissue integrity Goals: Ulcer/skin breakdown will heal within 14 weeks Date Initiated: 10/30/2017 Target Resolution Date: 01/22/2018 Goal Status: Active Interventions: Assess patient/caregiver ability to obtain necessary supplies Assess patient/caregiver ability to perform ulcer/skin care regimen upon admission and as needed Assess ulceration(s) every visit Notes: Electronic Signature(s) Signed: 11/20/2017 5:35:44 PM By: Curtis Sites Entered By: Curtis Sites on 11/20/2017 14:27:31 [name], [name] ([id number]) -------------------------------------------------------------------------------- Pain Assessment Details Patient Name: [name] Alma. Date of [name]: 11/20/2017 1:30 PM Medical Record Number: [id number] Patient Account Number: [id number] Date of Birth/Sex: 04-02-1945 (72 y.o. M) Treating  RN: Huel Coventry Primary Care Chalmers Iddings: Etheleen Nicks Other Clinician: Referring Taseen Marasigan: Etheleen Nicks Treating Keylah Darwish/Extender: Linwood Dibbles, HOYT Weeks in  Treatment: 3 Active Problems Location of Pain Severity and Description of Pain Patient Has Paino Yes Site Locations Pain Management and Medication Current Pain Management: Goals for Pain Management Yes, Hip Electronic Signature(s) Signed: 11/23/2017 3:24:13 PM By: Elliot Gurney, BSN, RN, CWS, Kim RN, BSN Entered By: Elliot Gurney, BSN, RN, CWS, Kim on 11/20/2017 13:45:30 [name] Alma ([id number]) -------------------------------------------------------------------------------- Patient/Caregiver Education Details Patient Name: [name] Alma. Date of [name]: 11/20/2017 1:30 PM Medical Record Number: [id number] Patient Account Number: [id number] Date of Birth/Gender: 01-08-1946 (72 y.o. M) Treating RN: Curtis Sites Primary Care Physician: Etheleen Nicks Other Clinician: Referring Physician: Etheleen Nicks Treating Physician/Extender: Skeet Simmer in Treatment: 3 Education Assessment Education Provided To: Patient Education Topics Provided Venous: Handouts: Other: continue compression daily Methods: Explain/Verbal Responses: State content correctly Electronic Signature(s) Signed: 11/20/2017 5:35:44 PM By: Curtis Sites Entered By: Curtis Sites on 11/20/2017 15:02:48 [name], [name] ([id number]) -------------------------------------------------------------------------------- Wound Assessment Details Patient Name: [name] Pen R. Date of [name]: 11/20/2017 1:30 PM Medical Record Number: [id number] Patient Account Number: [id number] Date of Birth/Sex: [birth year] (72 y.o. M) Treating RN: Huel Coventry Primary Care Jasslyn Finkel: Etheleen Nicks Other Clinician: Referring Taliyah Watrous: Etheleen Nicks Treating Sidnie Swalley/Extender: Linwood Dibbles, HOYT Weeks in Treatment: 3 Wound Status Wound Number: 6 Primary Lymphedema Etiology: Wound Location: Right Malleolus - Lateral Wound Status: Open Wounding Event: Gradually Appeared Comorbid Anemia, Sleep Apnea, Hypertension, Date Acquired:  09/27/2017 History: Osteoarthritis Weeks Of Treatment: 3 Clustered Wound: No Wound Measurements Length: (cm) 3.2 Width: (cm) 2.2 Depth: (cm) 0.2 Area: (cm) 5.529 Volume: (cm) 1.106 % Reduction in Area: -25.7% % Reduction in Volume: -25.7% Epithelialization: Small (1-33%) Tunneling: No Undermining: No Wound Description Full Thickness Without Exposed Support Classification: Structures Wound Margin: Flat and Intact Exudate Medium Amount: Exudate Type: Serous Exudate Color: amber Foul Odor After Cleansing: No Slough/Fibrino Yes Wound Bed Granulation Amount: Medium (34-66%) Exposed Structure Granulation Quality: Red, Pink Fascia Exposed: No Necrotic Amount: Medium (34-66%) Fat Layer (Subcutaneous Tissue) Exposed: Yes Necrotic Quality: Eschar, Adherent Slough Tendon Exposed: No Muscle Exposed: No Joint Exposed: No Bone Exposed: No Periwound Skin Texture Texture Color No Abnormalities Noted: No No Abnormalities Noted: No Callus: No Atrophie Blanche: No Crepitus: No Cyanosis: No Excoriation: No Ecchymosis: Yes Induration: No Erythema: No Rash: No Hemosiderin Staining: Yes Scarring: No Mottled: No Pallor: No Moisture Rubor: No No Abnormalities Noted: No Dry / Scaly: No Temperature / Pain [name], [name] ([id number]) Maceration: No Temperature: No Abnormality Wound Preparation Ulcer Cleansing: Rinsed/Irrigated with Saline Topical Anesthetic Applied: Other: lidocaine 4%, Treatment Notes Wound #6 (Right, Lateral Malleolus) 1. Cleansed with: Cleanse wound with antibacterial soap and water 2. Anesthetic Topical Lidocaine 4% cream to wound bed prior to debridement 3. Peri-wound Care: Moisturizing lotion 4. Dressing Applied: Iodoflex Other dressing (specify in notes) 5. Secondary Dressing Applied ABD Pad 7. Secured with 3 Layer Compression System - Right Lower Extremity Notes KerraMax, carboflex, unna to Ecologist) Signed:  11/23/2017 3:24:13 PM By: Elliot Gurney, BSN, RN, CWS, Kim RN, BSN Entered By: Elliot Gurney, BSN, RN, CWS, Kim on 11/20/2017 13:54:14 [name], [name] ([id number]) -------------------------------------------------------------------------------- Vitals Details Patient Name: [name] Alma. Date of [name]: 11/20/2017 1:30 PM Medical Record Number: [id number] Patient Account Number: [id number] Date of Birth/Sex: September 10, 1945 (72 y.o. M) Treating RN: Huel Coventry Primary Care Coni Homesley: Etheleen Nicks Other Clinician: Referring Andretta Ergle: Etheleen Nicks Treating Ameet Sandy/Extender: Linwood Dibbles, HOYT Weeks in Treatment: 3  Vital Signs Time Taken: 13:45 Temperature (F): 97.8 Pulse (bpm): 78 Respiratory Rate (breaths/min): 18 Blood Pressure (mmHg): 116/62 Reference Range: 80 - 120 mg / dl Electronic Signature(s) Signed: 11/23/2017 3:24:13 PM By: Elliot Gurney, BSN, RN, CWS, Kim RN, BSN Entered By: Elliot Gurney, BSN, RN, CWS, Kim on 11/20/2017 13:48:52

## 2017-11-30 ENCOUNTER — Encounter: Payer: Medicare Other | Attending: Physician Assistant | Admitting: Physician Assistant

## 2017-11-30 DIAGNOSIS — E11622 Type 2 diabetes mellitus with other skin ulcer: Secondary | ICD-10-CM | POA: Diagnosis not present

## 2017-11-30 DIAGNOSIS — I10 Essential (primary) hypertension: Secondary | ICD-10-CM | POA: Diagnosis not present

## 2017-11-30 DIAGNOSIS — L97312 Non-pressure chronic ulcer of right ankle with fat layer exposed: Secondary | ICD-10-CM | POA: Insufficient documentation

## 2017-11-30 DIAGNOSIS — M1612 Unilateral primary osteoarthritis, left hip: Secondary | ICD-10-CM | POA: Insufficient documentation

## 2017-11-30 DIAGNOSIS — I499 Cardiac arrhythmia, unspecified: Secondary | ICD-10-CM | POA: Diagnosis not present

## 2017-11-30 DIAGNOSIS — I890 Lymphedema, not elsewhere classified: Secondary | ICD-10-CM | POA: Diagnosis not present

## 2017-11-30 DIAGNOSIS — I872 Venous insufficiency (chronic) (peripheral): Secondary | ICD-10-CM | POA: Insufficient documentation

## 2017-11-30 DIAGNOSIS — G4733 Obstructive sleep apnea (adult) (pediatric): Secondary | ICD-10-CM | POA: Insufficient documentation

## 2017-11-30 DIAGNOSIS — Z87891 Personal history of nicotine dependence: Secondary | ICD-10-CM | POA: Diagnosis not present

## 2017-12-04 DIAGNOSIS — M1610 Unilateral primary osteoarthritis, unspecified hip: Secondary | ICD-10-CM | POA: Insufficient documentation

## 2017-12-05 NOTE — Progress Notes (Signed)
GEROD, CALIGIURI ([id number]) Visit Report for 11/30/2017 Chief Complaint Document Details Patient Name: [name], [name]. Date of Service: 11/30/2017 11:15 AM Medical Record Number: [id number] Patient Account Number: [id number] Date of Birth/Sex: 1945-03-02 (72 y.o. M) Treating RN: Huel Coventry Primary Care Provider: Etheleen Nicks Other Clinician: Referring Provider: Etheleen Nicks Treating Provider/Extender: Linwood Dibbles, HOYT Weeks in Treatment: 4 Information Obtained from: Patient Chief Complaint Right lateral ankle ulcer Electronic Signature(s) Signed: 12/01/2017 11:39:33 AM By: Lenda Kelp PA-C Entered By: Lenda Kelp on 11/30/2017 11:47:45 Laible, [name] ([id number]) -------------------------------------------------------------------------------- Debridement Details Patient Name: [name]. Date of Service: 11/30/2017 11:15 AM Medical Record Number: [id number] Patient Account Number: [id number] Date of Birth/Sex: 1945-07-04 (72 y.o. M) Treating RN: Huel Coventry Primary Care Provider: Etheleen Nicks Other Clinician: Referring Provider: Etheleen Nicks Treating Provider/Extender: Linwood Dibbles, HOYT Weeks in Treatment: 4 Debridement Performed for Wound #6 Right,Lateral Malleolus Assessment: Performed By: Physician STONE III, HOYT E., PA-C Debridement Type: Debridement Level of Consciousness (Pre- Awake and Alert procedure): Pre-procedure Verification/Time Yes - 11:50 Out Taken: Start Time: 11:50 Pain Control: Lidocaine Total Area Debrided (L x W): 3 (cm) x 1.8 (cm) = 5.4 (cm) Tissue and other material Viable, Non-Viable, Slough, Subcutaneous, Biofilm, Slough debrided: Level: Skin/Subcutaneous Tissue Debridement Description: Excisional Instrument: Curette Bleeding: Moderate Hemostasis Achieved: Pressure End Time: 11:51 Procedural Pain: 2 Post Procedural Pain: 2 Response to Treatment: Procedure was tolerated well Level of Consciousness Awake and  Alert (Post-procedure): Post Debridement Measurements of Total Wound Length: (cm) 3 Width: (cm) 1.8 Depth: (cm) 0.2 Volume: (cm) 0.848 Character of Wound/Ulcer Post Debridement: Stable Post Procedure Diagnosis Same as Pre-procedure Electronic Signature(s) Signed: 12/01/2017 11:39:33 AM By: Lenda Kelp PA-C Signed: 12/03/2017 10:02:59 AM By: Elliot Gurney, BSN, RN, CWS, Kim RN, BSN Entered By: Elliot Gurney, BSN, RN, CWS, Kim on 11/30/2017 11:52:03 Riddell, [name] ([id number]) -------------------------------------------------------------------------------- HPI Details Patient Name: [name] Pen R. Date of Service: 11/30/2017 11:15 AM Medical Record Number: [id number] Patient Account Number: [id number] Date of Birth/Sex: 12/21/1945 (72 y.o. M) Treating RN: Huel Coventry Primary Care Provider: Etheleen Nicks Other Clinician: Referring Provider: Etheleen Nicks Treating Provider/Extender: Linwood Dibbles, HOYT Weeks in Treatment: 4 History of Present Illness HPI Description: As noted above the patient had a injury due to working in the yard and this rapidly progressed to a ulcerated area with an infection. In October 2014 he did have some vascular studies done but never saw a Careers adviser and never had any surgery for varicose veins. His past medical history is significant for hypertension, obstructive sleep apnea, morbid obesity, stasis dermatitis of both lower extremities and his past surgical history is suggestive of hip surgery on the right, gastric bypass surgery, cholecystectomy, hip replacement. He has recently been put on Levaquin 500 milligrams daily, Bactrim DS 1 twice a day and Bactroban 2% topical ointment locally. No recent labs or vascular or imaging studies done. 07/11/2014 a venous duplex study was done on 07/05/2014 -- it showed no evidence of deep vein thrombosis or superficial thrombosis bilaterally. There was incompetence of the greater saphenous veins bilaterally and incompetence of the  right small saphenous vein. The patient has an appointment to see the vascular surgeons on June 20. 07/18/2014 his appointment with the vascular surgeons is rescheduled for this afternoon. 07/25/2014 -- he saw the vascular surgeon and is going to have a endovenous procedure done on July 29. 08/01/2014 -- he was diagnosed with a UTI and is on ciprofloxacin for 10 days. readmission: 11/27/15 On evaluation today patient is  having discomfort in the right ankle region following an open wound from having dropped a cardboard box striking his ankle. He tells me that this has not healed despite many of the measures that he learned from previous injuries when he was seen at our wound center. Therefore he didn't come in for reevaluation today. He has not noted any significant purulent discharge at this point in time. 12/04/15 patient appears to be doing somewhat better at this point in time today in regard to his ankle wound. it is measuring smaller and looking cleaner. 12/11/15; patient wound continues to look improve this is on the right lateral ankle. Debrided of surface slough and nonviable subcutaneous tissue. Using silver alginate 12-18-15 [name] presents today with his wife for follow-up on right lower extremity venous ulcer. He denies any changes or complications over the past week. He is tolerating compression therapy to his right lower extremity and has a properly fitting compression hose to the left lower extremity. 12/25/15; patient with a wound on his right lateral lower extremity in the setting of severe venous insufficiency [originally trauma against the box]. His edema is well controlled he is using Prisma. 01/01/16; continued improvement using Prisma. He has severe venous insufficiency however the wound was originally trauma against a box. 01/08/16; his wound is totally epithelialized. We will give him measurements for graded pressure stockings which I think he is going to get at  elastic therapy in [facility] Readmission: 10/30/17 patient presents today for reevaluation in our clinic although it has been since 2017 that we've last seen him. He has a ulcer on the right lateral ankle which he tells me began roughly 2 months ago. He states that he had one similar on the left ankle although this completely healed without any complication. However the one on the right as continue to give him a lot of trouble and is not healing appropriately. He really was not sure what the best thing to put on the area would be and therefore he's just been putting advantage to cover it. Another treatment has been utilized at this time. Fortunately there is not appear to be any evidence of significant infection necessarily although there is some evidence of abnormal discoloration in regard to [facility], Narciso R. ([id number]) the base of the wound that does not look as healthy as I was on the lives life. He does have erythema surrounding but I'm not sure if this is just due to chronic venous stasis/lymphedema or if it truly is anything infection wise going on at this point. Nonetheless in general the patient seems to be doing fairly well he's not having too much pain although he does have some discomfort. No fevers, chills, nausea, or vomiting noted at this time. 11/13/17 on evaluation today patient actually appears to be doing much better in regard to his wound currently. There does seem to be evidence of good improvement there still a lot of maceration and some necrotic tissue on the surface of the wound but this was cleaned away very well today. Overall I'm very pleased with how things seem to be progressing in general. We did get the results of his culture back which showed that he did have evidence of Staphylococcus aureus as well as group B strep. It does appear that the Bactrim was sensitive for both. The wound again does look better. 11/20/17 on evaluation today patient's right  lateral ankle ulcer actually appears to be doing excellent at this time. He has been tolerating the  dressing changes without complication. With that being said overall I do feel like that he has made great progress in the few weeks we've been seeing him at this point. No fevers, chills, nausea, or vomiting noted at this time. 11/30/17 on evaluation today patient appears to be doing extremely well at this point in regard to his right lateral ankle ulcer. He has been tolerating the dressing changes without complication. In general I've been very pleased with the overall progress. His wound overview chart shows that he is making steady progress towards closure. Electronic Signature(s) Signed: 12/01/2017 11:39:33 AM By: Lenda Kelp PA-C Entered By: Lenda Kelp on 11/30/2017 18:04:32 [name], [name] ([id number]) -------------------------------------------------------------------------------- Physical Exam Details Patient Name: [name]. Date of Service: 11/30/2017 11:15 AM Medical Record Number: [id number] Patient Account Number: [id number] Date of Birth/Sex: Oct 25, 1945 (72 y.o. M) Treating RN: Huel Coventry Primary Care Provider: Etheleen Nicks Other Clinician: Referring Provider: Etheleen Nicks Treating Provider/Extender: Linwood Dibbles, HOYT Weeks in Treatment: 4 Constitutional Well-nourished and well-hydrated in no acute distress. Respiratory normal breathing without difficulty. Psychiatric this patient is able to make decisions and demonstrates good insight into disease process. Alert and Oriented x 3. pleasant and cooperative. Notes Patient's wound bed currently shows evidence of good progress at this point. It did require some sharp debridement to clean away the necrotic material from the surface of the wound but overall I'm pleased and do not see any evidence that he has any sign of infection which is also great news. Overall I think he is making leaps and strides in the right  direction. Electronic Signature(s) Signed: 12/01/2017 11:39:33 AM By: Lenda Kelp PA-C Entered By: Lenda Kelp on 11/30/2017 18:05:12 [name], [name] ([id number]) -------------------------------------------------------------------------------- Physician Orders Details Patient Name: [name]. Date of Service: 11/30/2017 11:15 AM Medical Record Number: [id number] Patient Account Number: [id number] Date of Birth/Sex: 15-Apr-1945 (72 y.o. M) Treating RN: Huel Coventry Primary Care Provider: Etheleen Nicks Other Clinician: Referring Provider: Etheleen Nicks Treating Provider/Extender: Linwood Dibbles, HOYT Weeks in Treatment: 4 Verbal / Phone Orders: No Diagnosis Coding ICD-10 Coding Code Description I87.2 Venous insufficiency (chronic) (peripheral) I89.0 Lymphedema, not elsewhere classified L97.312 Non-pressure chronic ulcer of right ankle with fat layer exposed E66.01 Morbid (severe) obesity due to excess calories Wound Cleansing Wound #6 Right,Lateral Malleolus o Clean wound with Normal Saline. o May Shower, gently pat wound dry prior to applying new dressing. Anesthetic (add to Medication List) Wound #6 Right,Lateral Malleolus o Topical Lidocaine 4% cream applied to wound bed prior to debridement (In Clinic Only). Primary Wound Dressing Wound #6 Right,Lateral Malleolus o Iodoflex Secondary Dressing Wound #6 Right,Lateral Malleolus o ABD pad o Other - KerraMax, Carboflex Dressing Change Frequency Wound #6 Right,Lateral Malleolus o Change dressing every week Follow-up Appointments Wound #6 Right,Lateral Malleolus o Return Appointment in 1 week. o Nurse Visit as needed - Mr Colglazier may call for wrap change if needed Edema Control Wound #6 Right,Lateral Malleolus o 3 Layer Compression System - Right Lower Extremity Additional Orders / Instructions Wound #6 Right,Lateral Malleolus [name], [name] ([id number]) o Increase protein intake. o Activity  as tolerated Electronic Signature(s) Signed: 12/01/2017 11:39:33 AM By: Lenda Kelp PA-C Signed: 12/03/2017 10:02:59 AM By: Elliot Gurney, BSN, RN, CWS, Kim RN, BSN Entered By: Elliot Gurney, BSN, RN, CWS, Kim on 11/30/2017 11:52:33 [name], [name] ([id number]) -------------------------------------------------------------------------------- Problem List Details Patient Name: [name], LIENHARD. Date of Service: 11/30/2017 11:15 AM Medical Record Number: [id number] Patient Account Number: [id number] Date of Birth/Sex:  27-Oct-1945 (72 y.o. M) Treating RN: Huel Coventry Primary Care Provider: Etheleen Nicks Other Clinician: Referring Provider: Etheleen Nicks Treating Provider/Extender: Linwood Dibbles, HOYT Weeks in Treatment: 4 Active Problems ICD-10 Evaluated Encounter Code Description Active Date Today Diagnosis I87.2 Venous insufficiency (chronic) (peripheral) 10/30/2017 No Yes I89.0 Lymphedema, not elsewhere classified 10/30/2017 No Yes L97.312 Non-pressure chronic ulcer of right ankle with fat layer 10/30/2017 No Yes exposed E66.01 Morbid (severe) obesity due to excess calories 10/30/2017 No Yes Inactive Problems Resolved Problems Electronic Signature(s) Signed: 12/01/2017 11:39:33 AM By: Lenda Kelp PA-C Entered By: Lenda Kelp on 11/30/2017 11:47:39 [name], [name] ([id number]) -------------------------------------------------------------------------------- Progress Note Details Patient Name: [name]. Date of Service: 11/30/2017 11:15 AM Medical Record Number: [id number] Patient Account Number: [id number] Date of Birth/Sex: [birth year] (72 y.o. M) Treating RN: Huel Coventry Primary Care Provider: Etheleen Nicks Other Clinician: Referring Provider: Etheleen Nicks Treating Provider/Extender: Linwood Dibbles, HOYT Weeks in Treatment: 4 Subjective Chief Complaint Information obtained from Patient Right lateral ankle ulcer History of Present Illness (HPI) As noted above the patient had a injury  due to working in the yard and this rapidly progressed to a ulcerated area with an infection. In October 2014 he did have some vascular studies done but never saw a Careers adviser and never had any surgery for varicose veins. His past medical history is significant for hypertension, obstructive sleep apnea, morbid obesity, stasis dermatitis of both lower extremities and his past surgical history is suggestive of hip surgery on the right, gastric bypass surgery, cholecystectomy, hip replacement. He has recently been put on Levaquin 500 milligrams daily, Bactrim DS 1 twice a day and Bactroban 2% topical ointment locally. No recent labs or vascular or imaging studies done. 07/11/2014 a venous duplex study was done on 07/05/2014 -- it showed no evidence of deep vein thrombosis or superficial thrombosis bilaterally. 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 The patient has an appointment to see the vascular surgeons on June 20. 07/18/2014 his appointment with the vascular surgeons is rescheduled for this afternoon. 07/25/2014 -- he saw the vascular surgeon and is going to have a endovenous procedure done on July 29. 08/01/2014 -- he was diagnosed with a UTI and is on ciprofloxacin for 10 days. readmission: 11/27/15 On evaluation today patient is having discomfort in the right ankle region following an open wound from having dropped a cardboard box striking his ankle. He tells me that this has not healed despite many of the measures that he learned from previous injuries when he was seen at our wound center. Therefore he didn't come in for reevaluation today. He has not noted any significant purulent discharge at this point in time. 12/04/15 patient appears to be doing somewhat better at this point in time today in regard to his ankle wound. it is measuring smaller and looking cleaner. 12/11/15; patient wound continues to look  improve this is on the right lateral ankle. Debrided of surface slough and nonviable subcutaneous tissue. Using silver alginate 12-18-15 Mr. Gfeller presents today with his wife for follow-up on right lower extremity venous ulcer. He denies any changes or complications over the past week. He is tolerating compression therapy to his right lower extremity and has a properly fitting compression hose to the left lower extremity. 12/25/15; patient with a wound on his right lateral lower extremity in the setting of severe venous insufficiency [originally trauma against the box]. His edema is well controlled he is using  Prisma. 01/01/16; continued improvement using Prisma. He has severe venous insufficiency however the wound was originally trauma against a box. 01/08/16; his wound is totally epithelialized. We will give him measurements for graded pressure stockings which I think he is going to get at elastic therapy in [facility] Readmission: [name], [name] ([id number]) 10/30/17 patient presents today for reevaluation in our clinic although it has been since 2017 that we've last seen him. He has a ulcer on the right lateral ankle which he tells me began roughly 2 months ago. He states that he had one similar on the left ankle although this completely healed without any complication. However the one on the right as continue to give him a lot of trouble and is not healing appropriately. He really was not sure what the best thing to put on the area would be and therefore he's just been putting advantage to cover it. Another treatment has been utilized at this time. Fortunately there is not appear to be any evidence of significant infection necessarily although there is some evidence of abnormal discoloration in regard to the base of the wound that does not look as healthy as I was on the lives life. He does have erythema surrounding but I'm not sure if this is just due to chronic venous  stasis/lymphedema or if it truly is anything infection wise going on at this point. Nonetheless in general the patient seems to be doing fairly well he's not having too much pain although he does have some discomfort. No fevers, chills, nausea, or vomiting noted at this time. 11/13/17 on evaluation today patient actually appears to be doing much better in regard to his wound currently. There does seem to be evidence of good improvement there still a lot of maceration and some necrotic tissue on the surface of the wound but this was cleaned away very well today. Overall I'm very pleased with how things seem to be progressing in general. We did get the results of his culture back which showed that he did have evidence of Staphylococcus aureus as well as group B strep. It does appear that the Bactrim was sensitive for both. The wound again does look better. 11/20/17 on evaluation today patient's right lateral ankle ulcer actually appears to be doing excellent at this time. He has been tolerating the dressing changes without complication. With that being said overall I do feel like that he has made great progress in the few weeks we've been seeing him at this point. No fevers, chills, nausea, or vomiting noted at this time. 11/30/17 on evaluation today patient appears to be doing extremely well at this point in regard to his right lateral ankle ulcer. He has been tolerating the dressing changes without complication. In general I've been very pleased with the overall progress. His wound overview chart shows that he is making steady progress towards closure. Patient History Information obtained from Patient. Family History Cancer - Mother, Diabetes - Mother, Heart Disease - Maternal Grandparents, No family history of Hereditary Spherocytosis, Hypertension, Kidney Disease, Lung Disease, Seizures, Stroke, Thyroid Problems, Tuberculosis. Social History Former smoker, Marital Status - Married, Alcohol Use  - Rarely, Drug Use - No History, Caffeine Use - Moderate. Medical History Hospitalization/Surgery History - 06/28/2003, [place], cellulitis. - 07/27/2005, [facility] [place], cellulitis. - 07/28/2003, [facility], hip replacement. - 06/30/2014, [facility] ED, cellulitis. 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 Respiratory The patient has no complaints or symptoms. Cardiovascular The patient has no complaints or symptoms. Psychiatric The patient has no complaints or symptoms. [name], [name] ([id number]) Objective Constitutional Well-nourished and well-hydrated in no acute distress. Vitals Time Taken: 11:33 AM, Temperature: 98.0 F, Pulse: 68 bpm, Respiratory Rate: 18 breaths/min, Blood Pressure: 151/78 mmHg. Respiratory normal breathing without difficulty. Psychiatric this patient is able to make decisions and demonstrates good insight into disease process. Alert and Oriented x 3. pleasant and cooperative. General Notes: Patient's wound bed currently shows evidence of good progress at this point. It did require some sharp debridement to clean away the necrotic material from the surface of the wound but overall I'm pleased and do not see any evidence that he has any sign of infection which is also great news. Overall I think he is making leaps and strides in the right direction. Integumentary (Hair, Skin) Wound #6 status is Open. Original cause of wound was Gradually Appeared. The wound is located on the Right,Lateral Malleolus. The wound measures 3cm length x 1.8cm width x 0.1cm depth; 4.241cm^2 area and 0.424cm^3 volume. There is Fat Layer (Subcutaneous Tissue) Exposed exposed. There is no tunneling or undermining noted. There is a small amount of serosanguineous drainage noted. The wound margin is flat and intact. There is medium  (34-66%) red, pink granulation within the wound bed. There is a small (1-33%) amount of necrotic tissue within the wound bed including Eschar and Adherent Slough. The periwound skin appearance exhibited: Ecchymosis, Hemosiderin Staining. The periwound skin appearance did not exhibit: Callus, Crepitus, Excoriation, Induration, Rash, Scarring, Dry/Scaly, Maceration, Atrophie Blanche, Cyanosis, Mottled, Pallor, Rubor, Erythema. Periwound temperature was noted as No Abnormality. Assessment Active Problems ICD-10 Venous insufficiency (chronic) (peripheral) Lymphedema, not elsewhere classified Non-pressure chronic ulcer of right ankle with fat layer exposed Morbid (severe) obesity due to excess calories Procedures Wound #6 Pre-procedure diagnosis of Wound #6 is a Lymphedema located on the Right,Lateral Malleolus . There was a Excisional Skin/Subcutaneous Tissue Debridement with a total area of 5.4 sq cm performed by STONE III, HOYT E., PA-C. With the [facility]. ([id number]) following instrument(s): Curette to remove Viable and Non-Viable tissue/material. Material removed includes Subcutaneous Tissue, Slough, and Biofilm after achieving pain control using Lidocaine. No specimens were taken. A time out was conducted at 11:50, prior to the start of the procedure. A Moderate amount of bleeding was controlled with Pressure. The procedure was tolerated well with a pain level of 2 throughout and a pain level of 2 following the procedure. Post Debridement Measurements: 3cm length x 1.8cm width x 0.2cm depth; 0.848cm^3 volume. Character of Wound/Ulcer Post Debridement is stable. Post procedure Diagnosis Wound #6: Same as Pre-Procedure Plan Wound Cleansing: Wound #6 Right,Lateral Malleolus: Clean wound with Normal Saline. May Shower, gently pat wound dry prior to applying new dressing. Anesthetic (add to Medication List): Wound #6 Right,Lateral Malleolus: Topical Lidocaine 4% cream applied to  wound bed prior to debridement (In Clinic Only). Primary Wound Dressing: Wound #6 Right,Lateral Malleolus: Iodoflex Secondary Dressing: Wound #6 Right,Lateral Malleolus: ABD pad Other - KerraMax, Carboflex Dressing Change Frequency: Wound #6 Right,Lateral Malleolus: Change dressing every week Follow-up Appointments: Wound #6 Right,Lateral Malleolus: Return Appointment in 1 week. Nurse Visit as needed - Mr Ritchey may call for wrap change if needed Edema Control: Wound #6 Right,Lateral Malleolus: 3 Layer Compression System - Right Lower Extremity Additional Orders / Instructions: Wound #6 Right,Lateral Malleolus: Increase protein intake. Activity as tolerated I'm in a recommend  that we continue with the above wound care measures for the next week. Patient is in agreement the plan. We will subsequently see were things stand at follow-up. If anything changes or worsens meantime will contact the office and let me know. Please see above for specific wound care orders. We will see patient for re-evaluation in 1 week(s) here in the clinic. If anything worsens or changes patient will contact our office for additional recommendations. Electronic Signature(s) Signed: 12/01/2017 11:39:33 AM By: Belia Heman, Davidson R. ([id number]) Entered By: Lenda Kelp on 11/30/2017 18:05:34 [name] ([id number]) -------------------------------------------------------------------------------- ROS/PFSH Details Patient Name: [name]. Date of Service: 11/30/2017 11:15 AM Medical Record Number: [id number] Patient Account Number: [id number] Date of Birth/Sex: [birth year] (72 y.o. M) Treating RN: Huel Coventry Primary Care Provider: Etheleen Nicks Other Clinician: Referring Provider: Etheleen Nicks Treating Provider/Extender: Linwood Dibbles, HOYT Weeks in Treatment: 4 Information Obtained From Patient Wound History Do you currently have one or more open woundso Yes How many open  wounds do you currently haveo 1 Approximately how long have you had your woundso 1 month + How have you been treating your wound(s) until nowo bandaid Has your wound(s) ever healed and then re-openedo No Have you had any lab work done in the past montho No Have you tested positive for an antibiotic resistant organism (MRSA, VRE)o No Have you tested positive for osteomyelitis (bone infection)o No Have you had any tests for circulation on your legso No Constitutional Symptoms (General Health) Complaints and Symptoms: Negative for: Fever; Chills Eyes Medical History: Negative for: Cataracts; Glaucoma; Optic Neuritis Ear/Nose/Mouth/Throat Medical History: Negative for: Chronic sinus problems/congestion; Middle ear problems Hematologic/Lymphatic Medical History: Positive for: Anemia - iron pills Negative for: Hemophilia; Human Immunodeficiency Virus; Lymphedema; Sickle Cell Disease Respiratory Complaints and Symptoms: No Complaints or Symptoms Medical History: Positive for: Sleep Apnea - C-pap and oxygen 1.5L at night Negative for: Aspiration; Asthma; Chronic Obstructive Pulmonary Disease (COPD); Pneumothorax; Tuberculosis Cardiovascular Complaints and Symptoms: No Complaints or Symptoms Medical History: Positive for: Hypertension [name], [name] ([id number]) Negative for: Angina; Arrhythmia; Congestive Heart Failure; Coronary Artery Disease; Deep Vein Thrombosis; Hypotension; Myocardial Infarction; Peripheral Arterial Disease; Peripheral Venous Disease; Phlebitis; Vasculitis Gastrointestinal Medical History: Negative for: Cirrhosis ; Colitis; Crohnos; Hepatitis A; Hepatitis B; Hepatitis C Endocrine Medical History: Negative for: Type I Diabetes; Type II Diabetes Genitourinary Medical History: Negative for: End Stage Renal Disease Immunological Medical History: Negative for: Lupus Erythematosus; Raynaudos; Scleroderma Integumentary (Skin) Medical History: Negative for:  History of Burn; History of pressure wounds Musculoskeletal Medical History: Positive for: Osteoarthritis - left hip Negative for: Gout; Rheumatoid Arthritis; Osteomyelitis Past Medical History Notes: has artificial right hip and right shoulder Neurologic Medical History: Negative for: Dementia; Neuropathy; Quadriplegia; Paraplegia; Seizure Disorder Oncologic Medical History: Negative for: Received Chemotherapy; Received Radiation Psychiatric Complaints and Symptoms: No Complaints or Symptoms Medical History: Negative for: Anorexia/bulimia; Confinement Anxiety Immunizations Pneumococcal Vaccine: Received Pneumococcal Vaccination: No Tetanus Vaccine: Last tetanus shot: 06/28/2011 [name], [name] ([id number]) Implantable Devices Hospitalization / Surgery History Name of Hospital Purpose of Hospitalization/Surgery Date [place] cellulitis 06/28/2003 [facility] [place] cell[facility]itis 07/27/2005 [facility] hip replacement 07/28/2003 [facility] ED cellulitis 06/30/2014 Family and Social History Cancer: Yes - Mother; Diabetes: Yes - Mother; Heart Disease: Yes - Maternal Grandparents; Hereditary Spherocytosis: No; Hypertension: No; Kidney Disease: No; Lung Disease: No; Seizures: No; Stroke: No; Thyroid Problems: No; Tuberculosis: No; Former smoker; Marital Status - Married; Alcohol Use: Rarely; Drug Use: No History; Caffeine Use: Moderate; Financial  Concerns: No; Food, Clothing or Shelter Needs: No; Support System Lacking: No; Transportation Concerns: No; Advanced Directives: Yes (Not Provided); Patient does not want information on Advanced Directives; Living Will: Yes (Not Provided); Medical Power of Attorney: Yes (Not Provided) Physician Affirmation I have reviewed and agree with the above information. Electronic Signature(s) Signed: 12/01/2017 11:39:33 AM By: Lenda Kelp PA-C Signed: 12/03/2017 10:02:59 AM By: Elliot Gurney, BSN, RN, CWS, Kim RN, BSN Entered By: Lenda Kelp on 11/30/2017 18:04:55 Goynes,  [name] ([id number]) -------------------------------------------------------------------------------- SuperBill Details Patient Name: [name]. Date of Service: 11/30/2017 Medical Record Number: [id number] Patient Account Number: [id number] Date of Birth/Sex: April 14, 1945 (72 y.o. M) Treating RN: Huel Coventry Primary Care Provider: Etheleen Nicks Other Clinician: Referring Provider: Etheleen Nicks Treating Provider/Extender: Linwood Dibbles, HOYT Weeks in Treatment: 4 Diagnosis Coding ICD-10 Codes Code Description I87.2 Venous insufficiency (chronic) (peripheral) I89.0 Lymphedema, not elsewhere classified L97.312 Non-pressure chronic ulcer of right ankle with fat layer exposed E66.01 Morbid (severe) obesity due to excess calories Facility Procedures CPT4 Code: 46962952 Description: 11042 - DEB SUBQ TISSUE 20 SQ CM/< ICD-10 Diagnosis Description L97.312 Non-pressure chronic ulcer of right ankle with fat layer ex Modifier: posed Quantity: 1 Physician Procedures CPT4 Code: [id number] Description: 11042 - WC PHYS SUBQ TISS 20 SQ CM ICD-10 Diagnosis Description L97.312 Non-pressure chronic ulcer of right ankle with fat layer ex Modifier: posed Quantity: 1 Electronic Signature(s) Signed: 12/01/2017 11:39:33 AM By: Lenda Kelp PA-C Entered By: Lenda Kelp on 11/30/2017 18:05:55

## 2017-12-07 ENCOUNTER — Encounter: Payer: Medicare Other | Admitting: Physician Assistant

## 2017-12-07 DIAGNOSIS — E11622 Type 2 diabetes mellitus with other skin ulcer: Secondary | ICD-10-CM | POA: Diagnosis not present

## 2017-12-09 NOTE — Progress Notes (Signed)
ERVIE, [name] ([id number]) Visit Report for 12/07/2017 Arrival Information Details Patient Name: [name], [name]. Date of Service: 12/07/2017 3:45 PM Medical Record Number: [id number] Patient Account Number: [id number] Date of Birth/Sex: 1945-03-12 (72 y.o. M) Treating RN: Huel Coventry Primary Care Dynisha Due: Etheleen Nicks Other Clinician: Referring Rafiel Mecca: Etheleen Nicks Treating Donne Robillard/Extender: Linwood Dibbles, HOYT Weeks in Treatment: 5 Visit Information History Since Last Visit Added or deleted any medications: No Patient Arrived: Crutches Any new allergies or adverse reactions: No Arrival Time: 16:14 Had a fall or experienced change in No Accompanied By: self activities of daily living that may affect Transfer Assistance: None risk of falls: Patient Identification Verified: Yes Signs or symptoms of abuse/neglect since last visito No Secondary Verification Process Yes Hospitalized since last visit: No Completed: Implantable device outside of the clinic excluding No Patient Has Alerts: Yes cellular tissue based products placed in the center Patient Alerts: Hgb a1c 5.3 (4-27- since last visit: 19) Has Dressing in Place as Prescribed: Yes Pain Present Now: Yes Electronic Signature(s) Signed: 12/07/2017 4:24:59 PM By: Dayton Martes RCP, RRT, CHT Entered By: Dayton Martes on 12/07/2017 16:14:33 [name], [name] ([id number]) -------------------------------------------------------------------------------- Compression Therapy Details Patient Name: [name]. Date of Service: 12/07/2017 3:45 PM Medical Record Number: [id number] Patient Account Number: [id number] Date of Birth/Sex: 1945-11-21 (72 y.o. M) Treating RN: Huel Coventry Primary Care Mariusz Jubb: Etheleen Nicks Other Clinician: Referring Shonte Beutler: Etheleen Nicks Treating Kazuto Sevey/Extender: Linwood Dibbles, HOYT Weeks in Treatment: 5 Compression Therapy Performed for Wound Assessment: Wound #6  Right,Lateral Malleolus Performed By: Clinician Huel Coventry, RN Compression Type: Three Layer Pre Treatment ABI: 1.3 Post Procedure Diagnosis Same as Pre-procedure Electronic Signature(s) Signed: 12/07/2017 6:08:50 PM By: Elliot Gurney, BSN, RN, CWS, Kim RN, BSN Entered By: Elliot Gurney, BSN, RN, CWS, Kim on 12/07/2017 16:56:00 [name], [name] ([id number]) -------------------------------------------------------------------------------- Encounter Discharge Information Details Patient Name: [name]. Date of Service: 12/07/2017 3:45 PM Medical Record Number: [id number] Patient Account Number: [id number] Date of Birth/Sex: [birth year] (72 y.o. M) Treating RN: Huel Coventry Primary Care Chelsea Pedretti: Etheleen Nicks Other Clinician: Referring Myson Levi: Etheleen Nicks Treating Ciara Kagan/Extender: Linwood Dibbles, HOYT Weeks in Treatment: 5 Encounter Discharge Information Items Discharge Condition: Stable Ambulatory Status: Crutches Discharge Destination: Home Transportation: Private Auto Accompanied By: wife Schedule Follow-up Appointment: Yes Clinical Summary of Care: Electronic Signature(s) Signed: 12/07/2017 6:08:50 PM By: Elliot Gurney, BSN, RN, CWS, Kim RN, BSN Entered By: Elliot Gurney, BSN, RN, CWS, Kim on 12/07/2017 16:56:56 [name], [name] ([id number]) -------------------------------------------------------------------------------- Lower Extremity Assessment Details Patient Name: [name], [name]. Date of Service: 12/07/2017 3:45 PM Medical Record Number: [id number] Patient Account Number: [id number] Date of Birth/Sex: 09-14-1945 (72 y.o. M) Treating RN: Rema Jasmine Primary Care Azavier Creson: Etheleen Nicks Other Clinician: Referring Novia Lansberry: Etheleen Nicks Treating Lamari Beckles/Extender: Linwood Dibbles, HOYT Weeks in Treatment: 5 Edema Assessment Assessed: [Left: No] [Right: No] [Left: Edema] [Right: :] Calf Left: Right: Point of Measurement: 35 cm From Medial Instep cm 40 cm Ankle Left: Right: Point of Measurement:  10 cm From Medial Instep cm 24 cm Vascular Assessment Claudication: Claudication Assessment [Right:None] Pulses: Dorsalis Pedis Palpable: [Right:Yes] Posterior Tibial Extremity colors, hair growth, and conditions: Extremity Color: [Right:Hyperpigmented] Hair Growth on Extremity: [Right:No] Temperature of Extremity: [Right:Warm] Capillary Refill: [Right:< 3 seconds] Toe Nail Assessment Left: Right: Thick: Yes Discolored: Yes Deformed: Yes Improper Length and Hygiene: No Electronic Signature(s) Signed: 12/07/2017 4:50:00 PM By: Rema Jasmine Entered By: Rema Jasmine on 12/07/2017 16:36:35 [name], [name] ([id number]) -------------------------------------------------------------------------------- Multi Wound Chart Details Patient Name: [name]. Date  of Service: 12/07/2017 3:45 PM Medical Record Number: [id number] Patient Account Number: [id number] Date of Birth/Sex: 1945-06-02 (72 y.o. M) Treating RN: Huel Coventry Primary Care Trevionne Advani: Etheleen Nicks Other Clinician: Referring Mehr Depaoli: Etheleen Nicks Treating Tamila Gaulin/Extender: Linwood Dibbles, HOYT Weeks in Treatment: 5 Vital Signs Height(in): Pulse(bpm): 58 Weight(lbs): Blood Pressure(mmHg): 123/46 Body Mass Index(BMI): Temperature(F): 98.2 Respiratory Rate 18 (breaths/min): Photos: [6:No Photos] [N/A:N/A] Wound Location: [6:Right Malleolus - Lateral] [N/A:N/A] Wounding Event: [6:Gradually Appeared] [N/A:N/A] Primary Etiology: [6:Lymphedema] [N/A:N/A] Comorbid History: [6:Anemia, Sleep Apnea, Hypertension, Osteoarthritis] [N/A:N/A] Date Acquired: [6:09/27/2017] [N/A:N/A] Weeks of Treatment: [6:5] [N/A:N/A] Wound Status: [6:Open] [N/A:N/A] Measurements L x W x D [6:2.4x1.6x0.1] [N/A:N/A] (cm) Area (cm) : [6:3.016] [N/A:N/A] Volume (cm) : [6:0.302] [N/A:N/A] % Reduction in Area: [6:31.40%] [N/A:N/A] % Reduction in Volume: [6:65.70%] [N/A:N/A] Classification: [6:Full Thickness Without Exposed Support Structures]  [N/A:N/A] Exudate Amount: [6:Small] [N/A:N/A] Exudate Type: [6:Serous] [N/A:N/A] Exudate Color: [6:amber] [N/A:N/A] Wound Margin: [6:Flat and Intact] [N/A:N/A] Granulation Amount: [6:Medium (34-66%)] [N/A:N/A] Granulation Quality: [6:Red, Pink] [N/A:N/A] Necrotic Amount: [6:Small (1-33%)] [N/A:N/A] Necrotic Tissue: [6:Eschar, Adherent Slough] [N/A:N/A] Exposed Structures: [6:Fat Layer (Subcutaneous Tissue) Exposed: Yes Fascia: No Tendon: No Muscle: No Joint: No Bone: No] [N/A:N/A] Epithelialization: [6:Small (1-33%)] [N/A:N/A] Periwound Skin Texture: [6:Excoriation: No Induration: No Callus: No Crepitus: No] [N/A:N/A] Rash: No Scarring: No Periwound Skin Moisture: Maceration: No N/A N/A Dry/Scaly: No Periwound Skin Color: Ecchymosis: Yes N/A N/A Hemosiderin Staining: Yes Atrophie Blanche: No Cyanosis: No Erythema: No Mottled: No Pallor: No Rubor: No Temperature: No Abnormality N/A N/A Tenderness on Palpation: No N/A N/A Wound Preparation: Ulcer Cleansing: N/A N/A Rinsed/Irrigated with Saline Topical Anesthetic Applied: Other: lidocaine 4% Treatment Notes Electronic Signature(s) Signed: 12/07/2017 6:08:50 PM By: Elliot Gurney, BSN, RN, CWS, Kim RN, BSN Entered By: Elliot Gurney, BSN, RN, CWS, Kim on 12/07/2017 16:45:40 [name] ([id number]) -------------------------------------------------------------------------------- Multi-Disciplinary Care Plan Details Patient Name: [name], [name]. Date of Service: 12/07/2017 3:45 PM Medical Record Number: [id number] Patient Account Number: [id number] Date of Birth/Sex: [birth year] (72 y.o. M) Treating RN: Huel Coventry Primary Care Ranika Mcniel: Etheleen Nicks Other Clinician: Referring Dwane Andres: Etheleen Nicks Treating Welford Christmas/Extender: Linwood Dibbles, HOYT Weeks in Treatment: 5 Active Inactive ` Abuse / Safety / Falls / Self Care Management Nursing Diagnoses: Impaired physical mobility Goals: Patient will not develop complications from  immobility Date Initiated: 10/30/2017 Target Resolution Date: 01/22/2018 Goal Status: Active Interventions: Assess fall risk on admission and as needed Notes: ` Orientation to the Wound Care Program Nursing Diagnoses: Knowledge deficit related to the wound healing center program Goals: Patient/caregiver will verbalize understanding of the Wound Healing Center Program Date Initiated: 10/30/2017 Target Resolution Date: 01/22/2018 Goal Status: Active Interventions: Provide education on orientation to the wound center Notes: ` Venous Leg Ulcer Nursing Diagnoses: Potential for venous Insuffiency (use before diagnosis confirmed) Goals: Patient will maintain optimal edema control Date Initiated: 10/30/2017 Target Resolution Date: 01/22/2018 Goal Status: Active Interventions: KAYLIN, SCHELLENBERG R. ([id number]) Assess peripheral edema status every visit. Compression as ordered Notes: ` Wound/Skin Impairment Nursing Diagnoses: Impaired tissue integrity Goals: Ulcer/skin breakdown will heal within 14 weeks Date Initiated: 10/30/2017 Target Resolution Date: 01/22/2018 Goal Status: Active Interventions: Assess patient/caregiver ability to obtain necessary supplies Assess patient/caregiver ability to perform ulcer/skin care regimen upon admission and as needed Assess ulceration(s) every visit Notes: Electronic Signature(s) Signed: 12/07/2017 6:08:50 PM By: Elliot Gurney, BSN, RN, CWS, Kim RN, BSN Entered By: Elliot Gurney, BSN, RN, CWS, Kim on 12/07/2017 16:45:24 [name], [name] ([id number]) -------------------------------------------------------------------------------- Pain Assessment Details Patient Name: [name] Pen R. Date of Service: 12/07/2017 3:45 PM  Medical Record Number: [id number] Patient Account Number: [id number] Date of Birth/Sex: 01/18/1946 (72 y.o. M) Treating RN: Huel CoventryWoody, Kim Primary Care Lilibeth Opie: Etheleen NicksMACDONALD, KERI Other Clinician: Referring Hermenia Fritcher: Etheleen NicksMACDONALD, KERI Treating  Laycie Schriner/Extender: Linwood DibblesSTONE III, HOYT Weeks in Treatment: 5 Active Problems Location of Pain Severity and Description of Pain Patient Has Paino Yes Site Locations Rate the pain. Current Pain Level: 9 Pain Management and Medication Current Pain Management: Electronic Signature(s) Signed: 12/07/2017 4:24:59 PM By: Dayton MartesWallace, RCP,RRT,CHT, Sallie RCP, RRT, CHT Signed: 12/07/2017 6:08:50 PM By: Elliot GurneyWoody, BSN, RN, CWS, Kim RN, BSN Entered By: Dayton MartesWallace, RCP,RRT,CHT, Sallie on 12/07/2017 16:14:42 [name] AlmaSHAFAR, [name] ([id number]) -------------------------------------------------------------------------------- Patient/Caregiver Education Details Patient Name: [name] AlmaSHAFAR, [name] Date of Service: 12/07/2017 3:45 PM Medical Record Number: [id number] Patient Account Number: [id number] Date of Birth/Gender: 04/14/1945 (72 y.o. M) Treating RN: Huel CoventryWoody, Kim Primary Care Physician: Etheleen NicksMACDONALD, KERI Other Clinician: Referring Physician: Etheleen NicksMACDONALD, KERI Treating Physician/Extender: Skeet SimmerSTONE III, HOYT Weeks in Treatment: 5 Education Assessment Education Provided To: Patient Education Topics Provided Venous: Handouts: Controlling Swelling with Multilayered Compression Wraps Methods: Demonstration, Explain/Verbal Responses: State content correctly Electronic Signature(s) Signed: 12/07/2017 6:08:50 PM By: Elliot GurneyWoody, BSN, RN, CWS, Kim RN, BSN Entered By: Elliot GurneyWoody, BSN, RN, CWS, Kim on 12/07/2017 16:48:25 [name], [name] ([id number]) -------------------------------------------------------------------------------- Wound Assessment Details Patient Name: [name] AlmaSHAFAR, [name] Date of Service: 12/07/2017 3:45 PM Medical Record Number: [id number] Patient Account Number: [id number] Date of Birth/Sex: 12/22/1945 (72 y.o. M) Treating RN: Rema JasmineNg, Wendi Primary Care Heyli Min: Etheleen NicksMACDONALD, KERI Other Clinician: Referring Kazzandra Desaulniers: Etheleen NicksMACDONALD, KERI Treating Awa Bachicha/Extender: Linwood DibblesSTONE III, HOYT Weeks in Treatment: 5 Wound Status Wound Number: 6 Primary  Lymphedema Etiology: Wound Location: Right Malleolus - Lateral Wound Status: Open Wounding Event: Gradually Appeared Comorbid Anemia, Sleep Apnea, Hypertension, Date Acquired: 09/27/2017 History: Osteoarthritis Weeks Of Treatment: 5 Clustered Wound: No Photos Photo Uploaded By: Rema JasmineNg, Wendi on 12/07/2017 16:49:13 Wound Measurements Length: (cm) 2.4 Width: (cm) 1.6 Depth: (cm) 0.1 Area: (cm) 3.016 Volume: (cm) 0.302 % Reduction in Area: 31.4% % Reduction in Volume: 65.7% Epithelialization: Small (1-33%) Tunneling: No Undermining: No Wound Description Full Thickness Without Exposed Support Classification: Structures Wound Margin: Flat and Intact Exudate Small Amount: Exudate Type: Serous Exudate Color: amber Foul Odor After Cleansing: No Slough/Fibrino Yes Wound Bed Granulation Amount: Medium (34-66%) Exposed Structure Granulation Quality: Red, Pink Fascia Exposed: No Necrotic Amount: Small (1-33%) Fat Layer (Subcutaneous Tissue) Exposed: Yes Necrotic Quality: Eschar, Adherent Slough Tendon Exposed: No Muscle Exposed: No Joint Exposed: No Bone Exposed: No [name], [name] ([id number]) Periwound Skin Texture Texture Color No Abnormalities Noted: No No Abnormalities Noted: No Callus: No Atrophie Blanche: No Crepitus: No Cyanosis: No Excoriation: No Ecchymosis: Yes Induration: No Erythema: No Rash: No Hemosiderin Staining: Yes Scarring: No Mottled: No Pallor: No Moisture Rubor: No No Abnormalities Noted: No Dry / Scaly: No Temperature / Pain Maceration: No Temperature: No Abnormality Wound Preparation Ulcer Cleansing: Rinsed/Irrigated with Saline Topical Anesthetic Applied: Other: lidocaine 4%, Treatment Notes Wound #6 (Right, Lateral Malleolus) Notes iodoflex, KerraMax, unna to anchor, 3 layer wrap Electronic Signature(s) Signed: 12/07/2017 4:50:00 PM By: Rema JasmineNg, Wendi Entered By: Rema JasmineNg, Wendi on 12/07/2017 16:34:36 [name], [name]  ([id number]) -------------------------------------------------------------------------------- Vitals Details Patient Name: [name] AlmaSHAFAR, Isaiyah R. Date of Service: 12/07/2017 3:45 PM Medical Record Number: [id number] Patient Account Number: [id number] Date of Birth/Sex: 12/08/1945 (72 y.o. M) Treating RN: Huel CoventryWoody, Kim Primary Care Liala Codispoti: Etheleen NicksMACDONALD, KERI Other Clinician: Referring Tytionna Cloyd: Etheleen NicksMACDONALD, KERI Treating Ilias Stcharles/Extender: Linwood DibblesSTONE III, HOYT Weeks in Treatment: 5 Vital Signs Time Taken: 16:12 Temperature (F): 98.2 Pulse (bpm): 58 Respiratory  Rate (breaths/min): 18 Blood Pressure (mmHg): 123/46 Reference Range: 80 - 120 mg / dl Notes Pulse taken manually Electronic Signature(s) Signed: 12/07/2017 6:08:50 PM By: Elliot Gurney, BSN, RN, CWS, Kim RN, BSN Previous Signature: 12/07/2017 4:24:59 PM Version By: Weyman Rodney, Sallie RCP, RRT, CHT Entered By: Elliot Gurney, BSN, RN, CWS, Kim on 12/07/2017 16:45:16

## 2017-12-09 NOTE — Progress Notes (Signed)
[name] ([id number]) Visit Report for 12/07/2017 Chief Complaint Document Details Patient Name: [name], [name]. Date of Service: 12/07/2017 3:45 PM Medical Record Number: [id number] Patient Account Number: [id number] Date of Birth/Sex: 1945-07-18 (72 y.o. M) Treating RN: Huel Coventry Primary Care Provider: Etheleen Nicks Other Clinician: Referring Provider: Etheleen Nicks Treating Provider/Extender: Linwood Dibbles, HOYT Weeks in Treatment: 5 Information Obtained from: Patient Chief Complaint Right lateral ankle ulcer Electronic Signature(s) Signed: 12/07/2017 6:19:34 PM By: Lenda Kelp PA-C Entered By: Lenda Kelp on 12/07/2017 15:53:17 Stager, Jorje Guild ([id number]) -------------------------------------------------------------------------------- HPI Details Patient Name: [name]. Date of Service: 12/07/2017 3:45 PM Medical Record Number: [id number] Patient Account Number: [id number] Date of Birth/Sex: [birth year] (72 y.o. M) Treating RN: Huel Coventry Primary Care Provider: Etheleen Nicks Other Clinician: Referring Provider: Etheleen Nicks Treating Provider/Extender: Linwood Dibbles, HOYT Weeks in Treatment: 5 History of Present Illness HPI Description: As noted above the patient had a injury due to working in the yard and this rapidly progressed to a ulcerated area with an infection. In October 2014 he did have some vascular studies done but never saw a Careers adviser and never had any surgery for varicose veins. His past medical history is significant for hypertension, obstructive sleep apnea, morbid obesity, stasis dermatitis of both lower extremities and his past surgical history is suggestive of hip surgery on the right, gastric bypass surgery, cholecystectomy, hip replacement. He has recently been put on Levaquin 500 milligrams daily, Bactrim DS 1 twice a day and Bactroban 2% topical ointment locally. No recent labs or vascular or imaging studies done. 07/11/2014 a venous  duplex study was done on 07/05/2014 -- it showed no evidence of deep vein thrombosis or superficial thrombosis bilaterally. 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 The patient has an appointment to see the vascular surgeons on June 20. 07/18/2014 his appointment with the vascular surgeons is rescheduled for this afternoon. 07/25/2014 -- he saw the vascular surgeon and is going to have a endovenous procedure done on July 29. 08/01/2014 -- he was diagnosed with a UTI and is on ciprofloxacin for 10 days. readmission: 11/27/15 On evaluation today patient is having discomfort in the right ankle region following an open wound from having dropped a cardboard box striking his ankle. He tells me that this has not healed despite many of the measures that he learned from previous injuries when he was seen at our wound center. Therefore he didn't come in for reevaluation today. He has not noted any significant purulent discharge at this point in time. 12/04/15 patient appears to be doing somewhat better at this point in time today in regard to his ankle wound. it is measuring smaller and looking cleaner. 12/11/15; patient wound continues to look improve this is on the right lateral ankle. Debrided of surface slough and nonviable subcutaneous tissue. Using silver alginate 12-18-15 [name] presents today with his wife for follow-up on right lower extremity venous ulcer. He denies any changes or complications over the past week. He is tolerating compression therapy to his right lower extremity and has a properly fitting compression hose to the left lower extremity. 12/25/15; patient with a wound on his right lateral lower extremity in the setting of severe venous insufficiency [originally trauma against the box]. His edema is well controlled he is using Prisma. 01/01/16; continued improvement using Prisma. He has severe venous  insufficiency however the wound was originally trauma against a box. 01/08/16; his wound  is totally epithelialized. We will give him measurements for graded pressure stockings which I think he is going to get at elastic therapy in [facility] Readmission: 10/30/17 patient presents today for reevaluation in our clinic although it has been since 2017 that we've last seen him. He has a ulcer on the right lateral ankle which he tells me began roughly 2 months ago. He states that he had one similar on the left ankle although this completely healed without any complication. However the one on the right as continue to give him a lot of trouble and is not healing appropriately. He really was not sure what the best thing to put on the area would be and therefore he's just been putting advantage to cover it. Another treatment has been utilized at this time. Fortunately there is not appear to be any evidence of significant infection necessarily although there is some evidence of abnormal discoloration in regard to [facility], Rico R. ([id number]) the base of the wound that does not look as healthy as I was on the lives life. He does have erythema surrounding but I'm not sure if this is just due to chronic venous stasis/lymphedema or if it truly is anything infection wise going on at this point. Nonetheless in general the patient seems to be doing fairly well he's not having too much pain although he does have some discomfort. No fevers, chills, nausea, or vomiting noted at this time. 11/13/17 on evaluation today patient actually appears to be doing much better in regard to his wound currently. There does seem to be evidence of good improvement there still a lot of maceration and some necrotic tissue on the surface of the wound but this was cleaned away very well today. Overall I'm very pleased with how things seem to be progressing in general. We did get the results of his culture back which showed  that he did have evidence of Staphylococcus aureus as well as group B strep. It does appear that the Bactrim was sensitive for both. The wound again does look better. 11/20/17 on evaluation today patient's right lateral ankle ulcer actually appears to be doing excellent at this time. He has been tolerating the dressing changes without complication. With that being said overall I do feel like that he has made great progress in the few weeks we've been seeing him at this point. No fevers, chills, nausea, or vomiting noted at this time. 11/30/17 on evaluation today patient appears to be doing extremely well at this point in regard to his right lateral ankle ulcer. He has been tolerating the dressing changes without complication. In general I've been very pleased with the overall progress. His wound overview chart shows that he is making steady progress towards closure. 12/07/17 on evaluation today patient actually appears to be doing well in regard to his ulcer. He does have somewhat of an irregular heart rate noted at this point this was confirmed by manual checks as well. However it was not as low as the initial reading by the machine which was around 48 he was closer to around 58-62. Nonetheless it does sound as if you may have a little bit of an irregularity possibly a field although it was somewhat slow and difficult to gauge the exact rhythm. Otherwise his wound itself seems to be showing signs of great improvement with good epithelialization. He's tolerating the Iodoflex well. Electronic Signature(s) Signed: 12/07/2017 6:19:34 PM By: Lenda Kelp PA-C Entered By: Lenda Kelp on 12/07/2017 17:42:42  PERL, KERNEY ([id number]) -------------------------------------------------------------------------------- Physical Exam Details Patient Name: DANN, GALICIA R. Date of Service: 12/07/2017 3:45 PM Medical Record Number: [id number] Patient Account Number: [id number] Date of Birth/Sex:  [birth year] (72 y.o. M) Treating RN: Huel Coventry Primary Care Provider: Etheleen Nicks Other Clinician: Referring Provider: Etheleen Nicks Treating Provider/Extender: Linwood Dibbles, HOYT Weeks in Treatment: 5 Constitutional Well-nourished and well-hydrated in no acute distress. Respiratory normal breathing without difficulty. clear to auscultation bilaterally. Cardiovascular regular rate and rhythm with normal S1, S2. Psychiatric this patient is able to make decisions and demonstrates good insight into disease process. Alert and Oriented x 3. pleasant and cooperative. Notes Patient's wound bed currently shows evidence of good granulation at this time which is excellent news. He is showing good epithelialization as well he is tolerating the compression wrap without complication overall I'm very pleased with how things seem to be progressing. Electronic Signature(s) Signed: 12/07/2017 6:19:34 PM By: Lenda Kelp PA-C Entered By: Lenda Kelp on 12/07/2017 17:43:09 Brossman, Jorje Guild ([id number]) -------------------------------------------------------------------------------- Physician Orders Details Patient Name: [name]. Date of Service: 12/07/2017 3:45 PM Medical Record Number: [id number] Patient Account Number: [id number] Date of Birth/Sex: [birth year] (72 y.o. M) Treating RN: Huel Coventry Primary Care Provider: Etheleen Nicks Other Clinician: Referring Provider: Etheleen Nicks Treating Provider/Extender: Linwood Dibbles, HOYT Weeks in Treatment: 5 Verbal / Phone Orders: No Diagnosis Coding ICD-10 Coding Code Description I87.2 Venous insufficiency (chronic) (peripheral) I89.0 Lymphedema, not elsewhere classified L97.312 Non-pressure chronic ulcer of right ankle with fat layer exposed E66.01 Morbid (severe) obesity due to excess calories Wound Cleansing Wound #6 Right,Lateral Malleolus o Clean wound with Normal Saline. o May Shower, gently pat wound dry prior to applying  new dressing. Anesthetic (add to Medication List) Wound #6 Right,Lateral Malleolus o Topical Lidocaine 4% cream applied to wound bed prior to debridement (In Clinic Only). Primary Wound Dressing Wound #6 Right,Lateral Malleolus o Iodoflex Secondary Dressing Wound #6 Right,Lateral Malleolus o ABD pad o Other - KerraMax, Carboflex Dressing Change Frequency Wound #6 Right,Lateral Malleolus o Change dressing every week Follow-up Appointments Wound #6 Right,Lateral Malleolus o Return Appointment in 1 week. o Nurse Visit as needed - Mr Blanchfield may call for wrap change if needed Edema Control Wound #6 Right,Lateral Malleolus o 3 Layer Compression System - Right Lower Extremity Additional Orders / Instructions Wound #6 Right,Lateral Malleolus Griffin, Siegfried R. ([id number]) o Increase protein intake. o Activity as tolerated Electronic Signature(s) Signed: 12/07/2017 6:08:50 PM By: Elliot Gurney, BSN, RN, CWS, Kim RN, BSN Signed: 12/07/2017 6:19:34 PM By: Lenda Kelp PA-C Entered By: Elliot Gurney BSN, RN, CWS, Kim on 12/07/2017 16:47:28 Arens, Jorje Guild ([id number]) -------------------------------------------------------------------------------- Problem List Details Patient Name: JAXTYN, LINVILLE. Date of Service: 12/07/2017 3:45 PM Medical Record Number: [id number] Patient Account Number: [id number] Date of Birth/Sex: 11-Aug-1945 (72 y.o. M) Treating RN: Huel Coventry Primary Care Provider: Etheleen Nicks Other Clinician: Referring Provider: Etheleen Nicks Treating Provider/Extender: Linwood Dibbles, HOYT Weeks in Treatment: 5 Active Problems ICD-10 Evaluated Encounter Code Description Active Date Today Diagnosis I87.2 Venous insufficiency (chronic) (peripheral) 10/30/2017 No Yes I89.0 Lymphedema, not elsewhere classified 10/30/2017 No Yes L97.312 Non-pressure chronic ulcer of right ankle with fat layer 10/30/2017 No Yes exposed E66.01 Morbid (severe) obesity due to excess calories  10/30/2017 No Yes Inactive Problems Resolved Problems Electronic Signature(s) Signed: 12/07/2017 6:19:34 PM By: Lenda Kelp PA-C Entered By: Lenda Kelp on 12/07/2017 15:53:10 Lape, Jorje Guild ([id number]) -------------------------------------------------------------------------------- Progress Note Details Patient Name: [name]. Date of Service: 12/07/2017 3:45 PM  Medical Record Number: [id number] Patient Account Number: [id number] Date of Birth/Sex: [birth year] (72 y.o. M) Treating RN: Huel Coventry Primary Care Provider: Etheleen Nicks Other Clinician: Referring Provider: Etheleen Nicks Treating Provider/Extender: Linwood Dibbles, HOYT Weeks in Treatment: 5 Subjective Chief Complaint Information obtained from Patient Right lateral ankle ulcer History of Present Illness (HPI) As noted above the patient had a injury due to working in the yard and this rapidly progressed to a ulcerated area with an infection. In October 2014 he did have some vascular studies done but never saw a Careers adviser and never had any surgery for varicose veins. His past medical history is significant for hypertension, obstructive sleep apnea, morbid obesity, stasis dermatitis of both lower extremities and his past surgical history is suggestive of hip surgery on the right, gastric bypass surgery, cholecystectomy, hip replacement. He has recently been put on Levaquin 500 milligrams daily, Bactrim DS 1 twice a day and Bactroban 2% topical ointment locally. No recent labs or vascular or imaging studies done. 07/11/2014 a venous duplex study was done on 07/05/2014 -- it showed no evidence of deep vein thrombosis or superficial thrombosis bilaterally. 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 The patient has an appointment to see the vascular surgeons on June 20. 07/18/2014 his appointment with the vascular surgeons is rescheduled for  this afternoon. 07/25/2014 -- he saw the vascular surgeon and is going to have a endovenous procedure done on July 29. 08/01/2014 -- he was diagnosed with a UTI and is on ciprofloxacin for 10 days. readmission: 11/27/15 On evaluation today patient is having discomfort in the right ankle region following an open wound from having dropped a cardboard box striking his ankle. He tells me that this has not healed despite many of the measures that he learned from previous injuries when he was seen at our wound center. Therefore he didn't come in for reevaluation today. He has not noted any significant purulent discharge at this point in time. 12/04/15 patient appears to be doing somewhat better at this point in time today in regard to his ankle wound. it is measuring smaller and looking cleaner. 12/11/15; patient wound continues to look improve this is on the right lateral ankle. Debrided of surface slough and nonviable subcutaneous tissue. Using silver alginate 12-18-15 Mr. Dasch presents today with his wife for follow-up on right lower extremity venous ulcer. He denies any changes or complications over the past week. He is tolerating compression therapy to his right lower extremity and has a properly fitting compression hose to the left lower extremity. 12/25/15; patient with a wound on his right lateral lower extremity in the setting of severe venous insufficiency [originally trauma against the box]. His edema is well controlled he is using Prisma. 01/01/16; continued improvement using Prisma. He has severe venous insufficiency however the wound was originally trauma against a box. 01/08/16; his wound is totally epithelialized. We will give him measurements for graded pressure stockings which I think he is going to get at elastic therapy in [facility] Readmission: TERION, HEDMAN ([id number]) 10/30/17 patient presents today for reevaluation in our clinic although it has been since 2017  that we've last seen him. He has a ulcer on the right lateral ankle which he tells me began roughly 2 months ago. He states that he had one similar on the left ankle although this completely healed without any complication. However the one on the right as continue to give him  a lot of trouble and is not healing appropriately. He really was not sure what the best thing to put on the area would be and therefore he's just been putting advantage to cover it. Another treatment has been utilized at this time. Fortunately there is not appear to be any evidence of significant infection necessarily although there is some evidence of abnormal discoloration in regard to the base of the wound that does not look as healthy as I was on the lives life. He does have erythema surrounding but I'm not sure if this is just due to chronic venous stasis/lymphedema or if it truly is anything infection wise going on at this point. Nonetheless in general the patient seems to be doing fairly well he's not having too much pain although he does have some discomfort. No fevers, chills, nausea, or vomiting noted at this time. 11/13/17 on evaluation today patient actually appears to be doing much better in regard to his wound currently. There does seem to be evidence of good improvement there still a lot of maceration and some necrotic tissue on the surface of the wound but this was cleaned away very well today. Overall I'm very pleased with how things seem to be progressing in general. We did get the results of his culture back which showed that he did have evidence of Staphylococcus aureus as well as group B strep. It does appear that the Bactrim was sensitive for both. The wound again does look better. 11/20/17 on evaluation today patient's right lateral ankle ulcer actually appears to be doing excellent at this time. He has been tolerating the dressing changes without complication. With that being said overall I do feel like  that he has made great progress in the few weeks we've been seeing him at this point. No fevers, chills, nausea, or vomiting noted at this time. 11/30/17 on evaluation today patient appears to be doing extremely well at this point in regard to his right lateral ankle ulcer. He has been tolerating the dressing changes without complication. In general I've been very pleased with the overall progress. His wound overview chart shows that he is making steady progress towards closure. 12/07/17 on evaluation today patient actually appears to be doing well in regard to his ulcer. He does have somewhat of an irregular heart rate noted at this point this was confirmed by manual checks as well. However it was not as low as the initial reading by the machine which was around 48 he was closer to around 58-62. Nonetheless it does sound as if you may have a little bit of an irregularity possibly a field although it was somewhat slow and difficult to gauge the exact rhythm. Otherwise his wound itself seems to be showing signs of great improvement with good epithelialization. He's tolerating the Iodoflex well. Patient History Information obtained from Patient. Family History Cancer - Mother, Diabetes - Mother, Heart Disease - Maternal Grandparents, No family history of Hereditary Spherocytosis, Hypertension, Kidney Disease, Lung Disease, Seizures, Stroke, Thyroid Problems, Tuberculosis. Social History Former smoker, Marital Status - Married, Alcohol Use - Rarely, Drug Use - No History, Caffeine Use - Moderate. Medical History Hospitalization/Surgery History - 06/28/2003, [place], cellulitis. - 07/27/2005, [facility] [place], cellulitis. - 07/28/2003, [facility], hip replacement. - 06/30/2014, [facility] ED, cellulitis. 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 Respiratory The patient has no complaints or symptoms.  Cardiovascular Complains or has symptoms of LE edema. Psychiatric MUSCAB, BRENNEMAN ([id number]) The patient has no complaints or symptoms. Objective Constitutional Well-nourished and well-hydrated in no acute distress. Vitals Time Taken: 4:12 PM, Temperature: 98.2 F, Pulse: 58 bpm, Respiratory Rate: 18 breaths/min, Blood Pressure: 123/46 mmHg. General Notes: Pulse taken manually Respiratory normal breathing without difficulty. clear to auscultation bilaterally. Cardiovascular regular rate and rhythm with normal S1, S2. Psychiatric this patient is able to make decisions and demonstrates good insight into disease process. Alert and Oriented x 3. pleasant and cooperative. General Notes: Patient's wound bed currently shows evidence of good granulation at this time which is excellent news. He is showing good epithelialization as well he is tolerating the compression wrap without complication overall I'm very pleased with how things seem to be progressing. Integumentary (Hair, Skin) Wound #6 status is Open. Original cause of wound was Gradually Appeared. The wound is located on the Right,Lateral Malleolus. The wound measures 2.4cm length x 1.6cm width x 0.1cm depth; 3.016cm^2 area and 0.302cm^3 volume. There is Fat Layer (Subcutaneous Tissue) Exposed exposed. There is no tunneling or undermining noted. There is a small amount of serous drainage noted. The wound margin is flat and intact. There is medium (34-66%) red, pink granulation within the wound bed. There is a small (1-33%) amount of necrotic tissue within the wound bed including Eschar and Adherent Slough. The periwound skin appearance exhibited: Ecchymosis, Hemosiderin Staining. The periwound skin appearance did not exhibit: Callus, Crepitus, Excoriation, Induration, Rash, Scarring, Dry/Scaly, Maceration, Atrophie Blanche, Cyanosis, Mottled, Pallor, Rubor, Erythema.  Periwound temperature was noted as No Abnormality. Assessment Active Problems ICD-10 Venous insufficiency (chronic) (peripheral) Lymphedema, not elsewhere classified Non-pressure chronic ulcer of right ankle with fat layer exposed Morbid (severe) obesity due to excess calories Leisinger, Degan R. ([id number]) Procedures Wound #6 Pre-procedure diagnosis of Wound #6 is a Lymphedema located on the Right,Lateral Malleolus . There was a Three Layer Compression Therapy Procedure with a pre-treatment ABI of 1.3 by Huel Coventry, RN. Post procedure Diagnosis Wound #6: Same as Pre-Procedure Plan Wound Cleansing: Wound #6 Right,Lateral Malleolus: Clean wound with Normal Saline. May Shower, gently pat wound dry prior to applying new dressing. Anesthetic (add to Medication List): Wound #6 Right,Lateral Malleolus: Topical Lidocaine 4% cream applied to wound bed prior to debridement (In Clinic Only). Primary Wound Dressing: Wound #6 Right,Lateral Malleolus: Iodoflex Secondary Dressing: Wound #6 Right,Lateral Malleolus: ABD pad Other - KerraMax, Carboflex Dressing Change Frequency: Wound #6 Right,Lateral Malleolus: Change dressing every week Follow-up Appointments: Wound #6 Right,Lateral Malleolus: Return Appointment in 1 week. Nurse Visit as needed - Mr Mader may call for wrap change if needed Edema Control: Wound #6 Right,Lateral Malleolus: 3 Layer Compression System - Right Lower Extremity Additional Orders / Instructions: Wound #6 Right,Lateral Malleolus: Increase protein intake. Activity as tolerated My suggestion at this point is going to be that we continue with the above wound care measures for the next week. The patient is in agreement with the plan. If anything changes or worsens meantime he will contact the office and let me know. Otherwise I'm thankful that he continues to show signs of good improvement hopefully that trend will continue. Please see above for specific wound care  orders. We will see patient for re-evaluation in 1 week(s) here in the clinic. If anything worsens or changes patient will contact our office for additional recommendations. HENDRIXX, SEVERIN ([id number]) Electronic Signature(s) Signed: 12/07/2017 6:19:34 PM By: Lenda Kelp PA-C Entered By: Lenda Kelp on 12/07/2017  17:43:46 GIROLAMO, LORTIE ([id number]) -------------------------------------------------------------------------------- ROS/PFSH Details Patient Name: MICIAH, COVELLI. Date of Service: 12/07/2017 3:45 PM Medical Record Number: [id number] Patient Account Number: [id number] Date of Birth/Sex: 07-13-1945 (72 y.o. M) Treating RN: Huel Coventry Primary Care Provider: Etheleen Nicks Other Clinician: Referring Provider: Etheleen Nicks Treating Provider/Extender: Linwood Dibbles, HOYT Weeks in Treatment: 5 Information Obtained From Patient Wound History Do you currently have one or more open woundso Yes How many open wounds do you currently haveo 1 Approximately how long have you had your woundso 1 month + How have you been treating your wound(s) until nowo bandaid Has your wound(s) ever healed and then re-openedo No Have you had any lab work done in the past montho No Have you tested positive for an antibiotic resistant organism (MRSA, VRE)o No Have you tested positive for osteomyelitis (bone infection)o No Have you had any tests for circulation on your legso No Constitutional Symptoms (General Health) Complaints and Symptoms: Negative for: Fever; Chills Cardiovascular Complaints and Symptoms: Positive for: LE edema Medical History: Positive for: Hypertension Negative for: Angina; Arrhythmia; Congestive Heart Failure; Coronary Artery Disease; Deep Vein Thrombosis; Hypotension; Myocardial Infarction; Peripheral Arterial Disease; Peripheral Venous Disease; Phlebitis; Vasculitis Eyes Medical History: Negative for: Cataracts; Glaucoma; Optic  Neuritis Ear/Nose/Mouth/Throat Medical History: Negative for: Chronic sinus problems/congestion; Middle ear problems Hematologic/Lymphatic Medical History: Positive for: Anemia - iron pills Negative for: Hemophilia; Human Immunodeficiency Virus; Lymphedema; Sickle Cell Disease Respiratory Complaints and Symptoms: No Complaints or Symptoms Medical History: TIVON, LEMOINE. ([id number]) Positive for: Sleep Apnea - C-pap and oxygen 1.5L at night Negative for: Aspiration; Asthma; Chronic Obstructive Pulmonary Disease (COPD); Pneumothorax; Tuberculosis Gastrointestinal Medical History: Negative for: Cirrhosis ; Colitis; Crohnos; Hepatitis A; Hepatitis B; Hepatitis C Endocrine Medical History: Negative for: Type I Diabetes; Type II Diabetes Genitourinary Medical History: Negative for: End Stage Renal Disease Immunological Medical History: Negative for: Lupus Erythematosus; Raynaudos; Scleroderma Integumentary (Skin) Medical History: Negative for: History of Burn; History of pressure wounds Musculoskeletal Medical History: Positive for: Osteoarthritis - left hip Negative for: Gout; Rheumatoid Arthritis; Osteomyelitis Past Medical History Notes: has artificial right hip and right shoulder Neurologic Medical History: Negative for: Dementia; Neuropathy; Quadriplegia; Paraplegia; Seizure Disorder Oncologic Medical History: Negative for: Received Chemotherapy; Received Radiation Psychiatric Complaints and Symptoms: No Complaints or Symptoms Medical History: Negative for: Anorexia/bulimia; Confinement Anxiety Immunizations Pneumococcal Vaccine: Received Pneumococcal Vaccination: No Tetanus Vaccine: Last tetanus shot: 06/28/2011 YITZHAK, AWAN ([id number]) Implantable Devices Hospitalization / Surgery History Name of Hospital Purpose of Hospitalization/Surgery Date [place] cellulitis 06/28/2003 [facility] [place] cellulitis 07/27/2005 [facility] hip replacement 07/28/2003 [facility] ED  cellulitis 06/30/2014 Family and Social History Cancer: Yes - Mother; Diabetes: Yes - Mother; Heart Disease: Yes - Maternal Grandparents; Hereditary Spherocytosis: No; Hypertension: No; Kidney Disease: No; Lung Disease: No; Seizures: No; Stroke: No; Thyroid Problems: No; Tuberculosis: No; Former smoker; Marital Status - Married; Alcohol Use: Rarely; Drug Use: No History; Caffeine Use: Moderate; Financial Concerns: No; Food, Clothing or Shelter Needs: No; Support System Lacking: No; Transportation Concerns: No; Advanced Directives: Yes (Not Provided); Patient does not want information on Advanced Directives; Living Will: Yes (Not Provided); Medical Power of Attorney: Yes (Not Provided) Physician Affirmation I have reviewed and agree with the above information. Electronic Signature(s) Signed: 12/07/2017 6:08:50 PM By: Elliot Gurney, BSN, RN, CWS, Kim RN, BSN Signed: 12/07/2017 6:19:34 PM By: Lenda Kelp PA-C Entered By: Lenda Kelp on 12/07/2017 17:42:56 Ripoll, Jorje Guild ([id number]) -------------------------------------------------------------------------------- SuperBill Details Patient Name: [name]. Date of Service: 12/07/2017  Medical Record Number: [id number] Patient Account Number: [id number] Date of Birth/Sex: 03/05/1945 (72 y.o. M) Treating RN: Huel CoventryWoody, Kim Primary Care Provider: Etheleen NicksMACDONALD, KERI Other Clinician: Referring Provider: Etheleen NicksMACDONALD, KERI Treating Provider/Extender: Linwood DibblesSTONE III, HOYT Weeks in Treatment: 5 Diagnosis Coding ICD-10 Codes Code Description I87.2 Venous insufficiency (chronic) (peripheral) I89.0 Lymphedema, not elsewhere classified L97.312 Non-pressure chronic ulcer of right ankle with fat layer exposed E66.01 Morbid (severe) obesity due to excess calories Facility Procedures CPT4 Code Description: [id number] (Facility Use Only) [id number] - APPLY MULTLAY COMPRS LWR RT LEG Modifier: Quantity: 1 Physician Procedures CPT4 Code: [id number] Description: 99214 - WC  PHYS LEVEL 4 - EST PT ICD-10 Diagnosis Description I87.2 Venous insufficiency (chronic) (peripheral) I89.0 Lymphedema, not elsewhere classified L97.312 Non-pressure chronic ulcer of right ankle with fat layer e E66.01 Morbid  (severe) obesity due to excess calories Modifier: xposed Quantity: 1 Electronic Signature(s) Signed: 12/07/2017 6:19:34 PM By: Lenda KelpStone III, Hoyt PA-C Entered By: Lenda KelpStone III, Hoyt on 12/07/2017 17:43:58

## 2017-12-14 ENCOUNTER — Encounter: Payer: Medicare Other | Admitting: Physician Assistant

## 2017-12-14 ENCOUNTER — Ambulatory Visit: Payer: Medicare Other | Admitting: Physician Assistant

## 2017-12-14 DIAGNOSIS — E11622 Type 2 diabetes mellitus with other skin ulcer: Secondary | ICD-10-CM | POA: Diagnosis not present

## 2017-12-15 ENCOUNTER — Ambulatory Visit: Payer: Medicare Other | Admitting: Physician Assistant

## 2017-12-16 NOTE — Progress Notes (Signed)
Willette AlmaSHAFAR, Jerran R. ([id number]) Visit Report for 12/14/2017 Arrival Information Details Patient Name: Willette AlmaSHAFAR, Jahlil R. Date of Service: 12/14/2017 8:30 AM Medical Record Number: [id number] Patient Account Number: [id number] Date of Birth/Sex: 06/09/1945 (72 y.o. M) Treating RN: Huel CoventryWoody, Kim Primary Care Orene Abbasi: Etheleen NicksMACDONALD, KERI Other Clinician: Referring Mayson Mcneish: Etheleen NicksMACDONALD, KERI Treating Hendry Speas/Extender: Linwood DibblesSTONE III, HOYT Weeks in Treatment: 6 Visit Information History Since Last Visit Added or deleted any medications: No Patient Arrived: Cane Any new allergies or adverse reactions: No Arrival Time: 08:36 Had a fall or experienced change in No Accompanied By: wife activities of daily living that may affect Transfer Assistance: Manual risk of falls: Patient Identification Verified: Yes Signs or symptoms of abuse/neglect since last visito No Secondary Verification Process Yes Hospitalized since last visit: No Completed: Implantable device outside of the clinic excluding No Patient Has Alerts: Yes cellular tissue based products placed in the center Patient Alerts: Hgb a1c 5.3 (4-27- since last visit: 19) Has Dressing in Place as Prescribed: Yes Pain Present Now: No Electronic Signature(s) Signed: 12/14/2017 4:23:53 PM By: Dayton MartesWallace, RCP,RRT,CHT, Sallie RCP, RRT, CHT Entered By: Weyman RodneyWallace, RCP,RRT,CHT, Lucio EdwardSallie on 12/14/2017 08:40:53 Piscitelli, [name] ([id number]) -------------------------------------------------------------------------------- Compression Therapy Details Patient Name: Willette AlmaSHAFAR, Sydney R. Date of Service: 12/14/2017 8:30 AM Medical Record Number: [id number] Patient Account Number: [id number] Date of Birth/Sex: 10/06/1945 (72 y.o. M) Treating RN: Huel CoventryWoody, Kim Primary Care Emoni Whitworth: Etheleen NicksMACDONALD, KERI Other Clinician: Referring Sharmeka Palmisano: Etheleen NicksMACDONALD, KERI Treating Darshana Curnutt/Extender: Linwood DibblesSTONE III, HOYT Weeks in Treatment: 6 Compression Therapy Performed for Wound Assessment: Wound #6  Right,Lateral Malleolus Performed By: Clinician Huel CoventryWoody, Kim, RN Compression Type: Three Layer Pre Treatment ABI: 1.3 Post Procedure Diagnosis Same as Pre-procedure Electronic Signature(s) Signed: 12/15/2017 10:18:29 AM By: Elliot GurneyWoody, BSN, RN, CWS, Kim RN, BSN Entered By: Elliot GurneyWoody, BSN, RN, CWS, Kim on 12/14/2017 09:12:20 Willette AlmaSHAFAR, Talin R. ([id number]) -------------------------------------------------------------------------------- Encounter Discharge Information Details Patient Name: Willette AlmaSHAFAR, Nichlos R. Date of Service: 12/14/2017 8:30 AM Medical Record Number: [id number] Patient Account Number: [id number] Date of Birth/Sex: 04/27/1945 (72 y.o. M) Treating RN: Curtis Sitesorthy, Joanna Primary Care Lemuel Boodram: Etheleen NicksMACDONALD, KERI Other Clinician: Referring Tsugio Elison: Etheleen NicksMACDONALD, KERI Treating Jas Betten/Extender: Linwood DibblesSTONE III, HOYT Weeks in Treatment: 6 Encounter Discharge Information Items Discharge Condition: Stable Ambulatory Status: Cane Discharge Destination: Home Transportation: Private Auto Accompanied By: spouse Schedule Follow-up Appointment: Yes Clinical Summary of Care: Electronic Signature(s) Signed: 12/14/2017 10:16:13 AM By: Curtis Sitesorthy, Joanna Entered By: Curtis Sitesorthy, Joanna on 12/14/2017 10:16:12 Fabel, [name] ([id number]) -------------------------------------------------------------------------------- Lower Extremity Assessment Details Patient Name: Willette AlmaSHAFAR, Takao R. Date of Service: 12/14/2017 8:30 AM Medical Record Number: [id number] Patient Account Number: [id number] Date of Birth/Sex: 03/07/1945 (72 y.o. M) Treating RN: Rema JasmineNg, Wendi Primary Care Laloni Rowton: Etheleen NicksMACDONALD, KERI Other Clinician: Referring Analisa Sledd: Etheleen NicksMACDONALD, KERI Treating Codie Krogh/Extender: Linwood DibblesSTONE III, HOYT Weeks in Treatment: 6 Edema Assessment Assessed: [Left: No] [Right: No] [Left: Edema] [Right: :] Calf Left: Right: Point of Measurement: 35 cm From Medial Instep cm 39 cm Ankle Left: Right: Point of Measurement: 10 cm From Medial Instep  cm 24 cm Vascular Assessment Claudication: Claudication Assessment [Right:None] Pulses: Dorsalis Pedis Palpable: [Right:Yes] Posterior Tibial Extremity colors, hair growth, and conditions: Extremity Color: [Right:Hyperpigmented] Hair Growth on Extremity: [Right:No] Temperature of Extremity: [Right:Warm] Capillary Refill: [Right:< 3 seconds] Toe Nail Assessment Left: Right: Thick: No Discolored: No Deformed: No Improper Length and Hygiene: No Electronic Signature(s) Signed: 12/14/2017 4:14:45 PM By: Rema JasmineNg, Wendi Entered By: Rema JasmineNg, Wendi on 12/14/2017 08:58:29 Ricciardelli, [name] ([id number]) -------------------------------------------------------------------------------- Multi Wound Chart Details Patient Name: Willette AlmaSHAFAR, Dyllen R. Date of Service: 12/14/2017 8:30 AM Medical Record Number:  [id number] Patient Account Number: [id number] Date of Birth/Sex: 1945/08/25 (72 y.o. M) Treating RN: Huel Coventry Primary Care Samie Barclift: Etheleen Nicks Other Clinician: Referring Ora Bollig: Etheleen Nicks Treating Alannah Averhart/Extender: Linwood Dibbles, HOYT Weeks in Treatment: 6 Vital Signs Height(in): Pulse(bpm): 67 Weight(lbs): Blood Pressure(mmHg): 135/62 Body Mass Index(BMI): Temperature(F): 97.9 Respiratory Rate 18 (breaths/min): Photos: [6:No Photos] [N/A:N/A] Wound Location: [6:Right Malleolus - Lateral] [N/A:N/A] Wounding Event: [6:Gradually Appeared] [N/A:N/A] Primary Etiology: [6:Lymphedema] [N/A:N/A] Comorbid History: [6:Anemia, Sleep Apnea, Hypertension, Osteoarthritis] [N/A:N/A] Date Acquired: [6:09/27/2017] [N/A:N/A] Weeks of Treatment: [6:6] [N/A:N/A] Wound Status: [6:Open] [N/A:N/A] Measurements L x W x D [6:2.7x1.7x0.1] [N/A:N/A] (cm) Area (cm) : [6:3.605] [N/A:N/A] Volume (cm) : [6:0.36] [N/A:N/A] % Reduction in Area: [6:18.00%] [N/A:N/A] % Reduction in Volume: [6:59.10%] [N/A:N/A] Classification: [6:Full Thickness Without Exposed Support Structures] [N/A:N/A] Exudate Amount:  [6:Small] [N/A:N/A] Exudate Type: [6:Serous] [N/A:N/A] Exudate Color: [6:amber] [N/A:N/A] Wound Margin: [6:Flat and Intact] [N/A:N/A] Granulation Amount: [6:Medium (34-66%)] [N/A:N/A] Granulation Quality: [6:Red, Pink] [N/A:N/A] Necrotic Amount: [6:Small (1-33%)] [N/A:N/A] Necrotic Tissue: [6:Eschar, Adherent Slough] [N/A:N/A] Exposed Structures: [6:Fat Layer (Subcutaneous Tissue) Exposed: Yes Fascia: No Tendon: No Muscle: No Joint: No Bone: No] [N/A:N/A] Epithelialization: [6:Small (1-33%)] [N/A:N/A] Periwound Skin Texture: [6:Scarring: Yes Excoriation: No Induration: No Callus: No] [N/A:N/A] Crepitus: No Rash: No Periwound Skin Moisture: Maceration: Yes N/A N/A Dry/Scaly: No Periwound Skin Color: Ecchymosis: Yes N/A N/A Hemosiderin Staining: Yes Atrophie Blanche: No Cyanosis: No Erythema: No Mottled: No Pallor: No Rubor: No Temperature: No Abnormality N/A N/A Tenderness on Palpation: No N/A N/A Wound Preparation: Ulcer Cleansing: N/A N/A Rinsed/Irrigated with Saline Topical Anesthetic Applied: Other: lidocaine 4% Treatment Notes Electronic Signature(s) Signed: 12/15/2017 10:18:29 AM By: Elliot Gurney, BSN, RN, CWS, Kim RN, BSN Entered By: Elliot Gurney, BSN, RN, CWS, Kim on 12/14/2017 09:08:49 [name] ([id number]) -------------------------------------------------------------------------------- Multi-Disciplinary Care Plan Details Patient Name: MAHONRI, SEIDEN. Date of Service: 12/14/2017 8:30 AM Medical Record Number: [id number] Patient Account Number: [id number] Date of Birth/Sex: [birth year] (72 y.o. M) Treating RN: Huel Coventry Primary Care Chardonay [place]rit[place]: Etheleen Nicks Other Clinician: Referring Gwynn Crossley: Etheleen Nicks Treating Tamel Abel/Extender: Linwood Dibbles, HOYT Weeks in Treatment: 6 Active Inactive ` Abuse / Safety / Falls / Self Care Management Nursing Diagnoses: Impaired physical mobility Goals: Patient will not develop complications from immobility Date Initiated:  10/30/2017 Target Resolution Date: 01/22/2018 Goal Status: Active Interventions: Assess fall risk on admission and as needed Notes: ` Orientation to the Wound Care Program Nursing Diagnoses: Knowledge deficit related to the wound healing center program Goals: Patient/caregiver will verbalize understanding of the Wound Healing Center Program Date Initiated: 10/30/2017 Target Resolution Date: 01/22/2018 Goal Status: Active Interventions: Provide education on orientation to the wound center Notes: ` Venous Leg Ulcer Nursing Diagnoses: Potential for venous Insuffiency (use before diagnosis confirmed) Goals: Patient will maintain optimal edema control Date Initiated: 10/30/2017 Target Resolution Date: 01/22/2018 Goal Status: Active Interventions: WHITFIELD, DULAY R. ([id number]) Assess peripheral edema status every visit. Compression as ordered Notes: ` Wound/Skin Impairment Nursing Diagnoses: Impaired tissue integrity Goals: Ulcer/skin breakdown will heal within 14 weeks Date Initiated: 10/30/2017 Target Resolution Date: 01/22/2018 Goal Status: Active Interventions: Assess patient/caregiver ability to obtain necessary supplies Assess patient/caregiver ability to perform ulcer/skin care regimen upon admission and as needed Assess ulceration(s) every visit Notes: Electronic Signature(s) Signed: 12/15/2017 10:18:29 AM By: Elliot Gurney, BSN, RN, CWS, Kim RN, BSN Entered By: Elliot Gurney, BSN, RN, CWS, Kim on 12/14/2017 09:08:40 Karp, Jorje Guild ([id number]) -------------------------------------------------------------------------------- Pain Assessment Details Patient Name: [name] Date of Service: 12/14/2017 8:30 AM Medical Record Number: [id number] Patient Account Number: [id number]  Date of Birth/Sex: 05-07-1945 (72 y.o. M) Treating RN: Huel Coventry Primary Care Paiton Fosco: Etheleen Nicks Other Clinician: Referring Yasmene Salomone: Etheleen Nicks Treating Kaye Mitro/Extender: Linwood Dibbles,  HOYT Weeks in Treatment: 6 Active Problems Location of Pain Severity and Description of Pain Patient Has Paino No Site Locations Pain Management and Medication Current Pain Management: Electronic Signature(s) Signed: 12/14/2017 4:23:53 PM By: Dayton Martes RCP, RRT, CHT Signed: 12/15/2017 10:18:29 AM By: Elliot Gurney, BSN, RN, CWS, Kim RN, BSN Entered By: Dayton Martes on 12/14/2017 08:41:00 [name] ([id number]) -------------------------------------------------------------------------------- Patient/Caregiver Education Details Patient Name: [name] Date of Service: 12/14/2017 8:30 AM Medical Record Number: [id number] Patient Account Number: [id number] Date of Birth/Gender: [birth year] (72 y.o. M) Treating RN: Huel Coventry Primary Care Physician: Etheleen Nicks Other Clinician: Referring Physician: Etheleen Nicks Treating Physician/Extender: Skeet Simmer in Treatment: 6 Education Assessment Education Provided To: Patient Education Topics Provided Venous: Handouts: Controlling Swelling with Multilayered Compression Wraps Methods: Demonstration, Explain/Verbal Responses: State content correctly Electronic Signature(s) Signed: 12/15/2017 10:18:29 AM By: Elliot Gurney, BSN, RN, CWS, Kim RN, BSN Entered By: Elliot Gurney, BSN, RN, CWS, Kim on 12/14/2017 09:12:43 Parfitt, Jorje Guild ([id number]) -------------------------------------------------------------------------------- Wound Assessment Details Patient Name: [name] Date of Service: 12/14/2017 8:30 AM Medical Record Number: [id number] Patient Account Number: [id number] Date of Birth/Sex: [birth year] (72 y.o. M) Treating RN: Rema Jasmine Primary Care Marsean Elkhatib: Etheleen Nicks Other Clinician: Referring Malaika Arnall: Etheleen Nicks Treating Lyonel Morejon/Extender: Linwood Dibbles, HOYT Weeks in Treatment: 6 Wound Status Wound Number: 6 Primary Lymphedema Etiology: Wound Location: Right Malleolus -  Lateral Wound Status: Open Wounding Event: Gradually Appeared Comorbid Anemia, Sleep Apnea, Hypertension, Date Acquired: 09/27/2017 History: Osteoarthritis Weeks Of Treatment: 6 Clustered Wound: No Photos Photo Uploaded By: Rema Jasmine on 12/14/2017 09:10:20 Wound Measurements Length: (cm) 2.7 Width: (cm) 1.7 Depth: (cm) 0.1 Area: (cm) 3.605 Volume: (cm) 0.36 % Reduction in Area: 18% % Reduction in Volume: 59.1% Epithelialization: Small (1-33%) Tunneling: No Undermining: No Wound Description Full Thickness Without Exposed Support Classification: Structures Wound Margin: Flat and Intact Exudate Small Amount: Exudate Type: Serous Exudate Color: amber Foul Odor After Cleansing: No Slough/Fibrino Yes Wound Bed Granulation Amount: Medium (34-66%) Exposed Structure Granulation Quality: Red, Pink Fascia Exposed: No Necrotic Amount: Small (1-33%) Fat Layer (Subcutaneous Tissue) Exposed: Yes Necrotic Quality: Eschar, Adherent Slough Tendon Exposed: No Muscle Exposed: No Joint Exposed: No Bone Exposed: No Cavalieri, Arnel R. ([id number]) Periwound Skin Texture Texture Color No Abnormalities Noted: No No Abnormalities Noted: No Callus: No Atrophie Blanche: No Crepitus: No Cyanosis: No Excoriation: No Ecchymosis: Yes Induration: No Erythema: No Rash: No Hemosiderin Staining: Yes Scarring: Yes Mottled: No Pallor: No Moisture Rubor: No No Abnormalities Noted: No Dry / Scaly: No Temperature / Pain Maceration: Yes Temperature: No Abnormality Wound Preparation Ulcer Cleansing: Rinsed/Irrigated with Saline Topical Anesthetic Applied: Other: lidocaine 4%, Treatment Notes Wound #6 (Right, Lateral Malleolus) Notes prisma, KerraMax, unna to anchor, 3 layer wrap Electronic Signature(s) Signed: 12/14/2017 4:14:45 PM By: Rema Jasmine Entered By: Rema Jasmine on 12/14/2017 08:57:01 Callicott, Jorje Guild  ([id number]) -------------------------------------------------------------------------------- Vitals Details Patient Name: [name] Date of Service: 12/14/2017 8:30 AM Medical Record Number: [id number] Patient Account Number: [id number] Date of Birth/Sex: Mar 10, 1945 (72 y.o. M) Treating RN: Huel Coventry Primary Care Cianna Kasparian: Etheleen Nicks Other Clinician: Referring Joenathan Sakuma: Etheleen Nicks Treating Ashantia Amaral/Extender: Linwood Dibbles, HOYT Weeks in Treatment: 6 Vital Signs Time Taken: 08:40 Temperature (F): 97.9 Pulse (bpm): 67 Respiratory Rate (breaths/min): 18 Blood Pressure (mmHg): 135/62 Reference Range: 80 - 120 mg / dl  Electronic Signature(s) Signed: 12/14/2017 4:23:53 PM By: Dayton Martes RCP, RRT, CHT Entered By: Dayton Martes on 12/14/2017 08:43:16

## 2017-12-16 NOTE — Progress Notes (Signed)
[name], [name] ([id number]) Visit Report for 12/14/2017 Chief Complaint Document Details Patient Name: [name], [name] Date of Service: 12/14/2017 8:30 AM Medical Record Number: [id number] Patient Account Number: [id number] Date of Birth/Sex: 01/20/1946 (72 y.o. M) Treating RN: [name] Primary Care Provider: Etheleen NicksMACDONALD, KERI Other Clinician: Referring Provider: Etheleen NicksMACDONALD, KERI Treating Provider/Extender: Linwood DibblesSTONE III, Weslie Rasmus Weeks in Treatment: 6 Information Obtained from: Patient Chief Complaint Right lateral ankle ulcer Electronic Signature(s) Signed: 12/14/2017 12:51:28 PM By: Lenda KelpStone III, Kinslea Frances PA-C Entered By: Lenda KelpStone III, Kyree Fedorko on 12/14/2017 08:45:14 [name], [name] ([id number]) -------------------------------------------------------------------------------- HPI Details Patient Name: [name], [name] Date of Service: 12/14/2017 8:30 AM Medical Record Number: [id number] Patient Account Number: [id number] Date of Birth/Sex: 09/18/1945 (72 y.o. M) Treating RN: [name] Primary Care Provider: Etheleen NicksMACDONALD, KERI Other Clinician: Referring Provider: Etheleen NicksMACDONALD, KERI Treating Provider/Extender: Linwood DibblesSTONE III, Henleigh Robello Weeks in Treatment: 6 History of Present Illness HPI Description: As noted above the patient had a injury due to working in the yard and this rapidly progressed to a ulcerated area with an infection. In October 2014 he did have some vascular studies done but never saw a Careers advisersurgeon and never had any surgery for varicose veins. His past medical history is significant for hypertension, obstructive sleep apnea, morbid obesity, stasis dermatitis of both lower extremities and his past surgical history is suggestive of hip surgery on the right, gastric bypass surgery, cholecystectomy, hip replacement. He has recently been put on Levaquin 500 milligrams daily, Bactrim DS 1 twice a day and Bactroban 2% topical ointment locally. No recent labs or vascular or imaging studies done. 07/11/2014 a venous  duplex study was done on 07/05/2014 -- it showed no evidence of deep vein thrombosis or superficial thrombosis bilaterally. 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 The patient has an appointment to see the vascular surgeons on June 20. 07/18/2014 his appointment with the vascular surgeons is rescheduled for this afternoon. 07/25/2014 -- he saw the vascular surgeon and is going to have a endovenous procedure done on July 29. 08/01/2014 -- he was diagnosed with a UTI and is on ciprofloxacin for 10 days. readmission: 11/27/15 On evaluation today patient is having discomfort in the right ankle region following an open wound from having dropped a cardboard box striking his ankle. He tells me that this has not healed despite many of the measures that he learned from previous injuries when he was seen at our wound center. Therefore he didn't come in for reevaluation today. He has not noted any significant purulent discharge at this point in time. 12/04/15 patient appears to be doing somewhat better at this point in time today in regard to his ankle wound. it is measuring smaller and looking cleaner. 12/11/15; patient wound continues to look improve this is on the right lateral ankle. Debrided of surface slough and nonviable subcutaneous tissue. Using silver alginate 12-18-15 [name] presents today with his wife for follow-up on right lower extremity venous ulcer. He denies any changes or complications over the past week. He is tolerating compression therapy to his right lower extremity and has a properly fitting compression hose to the left lower extremity. 12/25/15; patient with a wound on his right lateral lower extremity in the setting of severe venous insufficiency [originally trauma against the box]. His edema is well controlled he is using Prisma. 01/01/16; continued improvement using Prisma. He has severe venous  insufficiency however the wound was originally trauma against a box. 01/08/16; his wound  is totally epithelialized. We will give him measurements for graded pressure stockings which I think he is going to get at elastic therapy in [facility] Readmission: 10/30/17 patient presents today for reevaluation in our clinic although it has been since 2017 that we've last seen him. He has a ulcer on the right lateral ankle which he tells me began roughly 2 months ago. He states that he had one similar on the left ankle although this completely healed without any complication. However the one on the right as continue to give him a lot of trouble and is not healing appropriately. He really was not sure what the best thing to put on the area would be and therefore he's just been putting advantage to cover it. Another treatment has been utilized at this time. Fortunately there is not appear to be any evidence of significant infection necessarily although there is some evidence of abnormal discoloration in regard to [facility], [name] ([id number]) the base of the wound that does not look as healthy as I was on the lives life. He does have erythema surrounding but I'm not sure if this is just due to chronic venous stasis/lymphedema or if it truly is anything infection wise going on at this point. Nonetheless in general the patient seems to be doing fairly well he's not having too much pain although he does have some discomfort. No fevers, chills, nausea, or vomiting noted at this time. 11/13/17 on evaluation today patient actually appears to be doing much better in regard to his wound currently. There does seem to be evidence of good improvement there still a lot of maceration and some necrotic tissue on the surface of the wound but this was cleaned away very well today. Overall I'm very pleased with how things seem to be progressing in general. We did get the results of his culture back which showed  that he did have evidence of Staphylococcus aureus as well as group B strep. It does appear that the Bactrim was sensitive for both. The wound again does look better. 11/20/17 on evaluation today patient's right lateral ankle ulcer actually appears to be doing excellent at this time. He has been tolerating the dressing changes without complication. With that being said overall I do feel like that he has made great progress in the few weeks we've been seeing him at this point. No fevers, chills, nausea, or vomiting noted at this time. 11/30/17 on evaluation today patient appears to be doing extremely well at this point in regard to his right lateral ankle ulcer. He has been tolerating the dressing changes without complication. In general I've been very pleased with the overall progress. His wound overview chart shows that he is making steady progress towards closure. 12/07/17 on evaluation today patient actually appears to be doing well in regard to his ulcer. He does have somewhat of an irregular heart rate noted at this point this was confirmed by manual checks as well. However it was not as low as the initial reading by the machine which was around 48 he was closer to around 58-62. Nonetheless it does sound as if you may have a little bit of an irregularity possibly a field although it was somewhat slow and difficult to gauge the exact rhythm. Otherwise his wound itself seems to be showing signs of great improvement with good epithelialization. He's tolerating the Iodoflex well. 12/14/17 on evaluation today patient continues to show signs of great improvement in my pinion. Overall I'm very pleased  with the progress that is made. With that being said though he has shown improvement he tells me that for the first couple of days after we put his wrap on that he has severe pain for pretty much that entire amount of time. Nonetheless at this time I'm not seeing any evidence of infection which is good  news. I think this may actually be coming from the actual dressing itself. That is the Iodoflex. Electronic Signature(s) Signed: 12/14/2017 12:51:28 PM By: Lenda Kelp PA-C Entered By: Lenda Kelp on 12/14/2017 12:00:03 [name] Alma ([id number]) -------------------------------------------------------------------------------- Physical Exam Details Patient Name: [name] Alma. Date of Service: 12/14/2017 8:30 AM Medical Record Number: [id number] Patient Account Number: [id number] Date of Birth/Sex: 12-31-1945 (72 y.o. M) Treating RN: [name] Coventry Primary Care Provider: Etheleen Nicks Other Clinician: Referring Provider: Etheleen Nicks Treating Provider/Extender: Linwood Dibbles, Isai Gottlieb Weeks in Treatment: 6 Constitutional Well-nourished and well-hydrated in no acute distress. Respiratory normal breathing without difficulty. clear to auscultation bilaterally. Cardiovascular regular rate and rhythm with normal S1, S2. trace pitting edema of the bilateral lower extremities. Psychiatric this patient is able to make decisions and demonstrates good insight into disease process. Alert and Oriented x 3. pleasant and cooperative. Notes Patient's wound bed currently shows evidence of good granulation at this time. He also has a very nice area of a skin [place] which is showing signs of excellent improvement as well. Overall I'm very pleased with how things seem to be progressing. There is no sign of infection at this time. No sharp debridement was required at this point. Electronic Signature(s) Signed: 12/14/2017 12:51:28 PM By: Lenda Kelp PA-C Entered By: Lenda Kelp on 12/14/2017 12:01:05 [name] Alma ([id number]) -------------------------------------------------------------------------------- Physician Orders Details Patient Name: [name] Alma. Date of Service: 12/14/2017 8:30 AM Medical Record Number: [id number] Patient Account Number: [id number] Date of Birth/Sex:  1946/01/02 (72 y.o. M) Treating RN: [name] Coventry Primary Care Provider: Etheleen Nicks Other Clinician: Referring Provider: Etheleen Nicks Treating Provider/Extender: Linwood Dibbles, Ilea Hilton Weeks in Treatment: 6 Verbal / Phone Orders: No Diagnosis Coding ICD-10 Coding Code Description I87.2 Venous insufficiency (chronic) (peripheral) I89.0 Lymphedema, not elsewhere classified L97.312 Non-pressure chronic ulcer of right ankle with fat layer exposed E66.01 Morbid (severe) obesity due to excess calories Wound Cleansing Wound #6 Right,Lateral Malleolus o Clean wound with Normal Saline. o May Shower, gently pat wound dry prior to applying new dressing. Anesthetic (add to Medication List) Wound #6 Right,Lateral Malleolus o Topical Lidocaine 4% cream applied to wound bed prior to debridement (In Clinic Only). Primary Wound Dressing Wound #6 Right,Lateral Malleolus o Silver Collagen Secondary Dressing Wound #6 Right,Lateral Malleolus o Other - KerraMax, Carboflex Dressing Change Frequency Wound #6 Right,Lateral Malleolus o Change dressing every week Follow-up Appointments Wound #6 Right,Lateral Malleolus o Return Appointment in 1 week. o Nurse Visit as needed - Mr Offord may call for wrap change if needed Edema Control Wound #6 Right,Lateral Malleolus o 3 Layer Compression System - Right Lower Extremity - unna paste to secure Additional Orders / Instructions Wound #6 Right,Lateral Malleolus o Increase protein intake. [name], [name] ([id number]) o Activity as tolerated Electronic Signature(s) Signed: 12/14/2017 12:51:28 PM By: Lenda Kelp PA-C Signed: 12/15/2017 10:18:29 AM By: Elliot Gurney, BSN, RN, CWS, Kim RN, BSN Entered By: Elliot Gurney, BSN, RN, CWS, [name] on 12/14/2017 09:11:54 [name], [name] ([id number]) -------------------------------------------------------------------------------- Problem List Details Patient Name: JOHNCHARLES, FUSSELMAN. Date of Service: 12/14/2017  8:30 AM Medical Record Number: [id number] Patient Account Number: [id number] Date of  Birth/Sex: [birth year] (72 y.o. M) Treating RN: [name] Coventry Primary Care Provider: Etheleen Nicks Other Clinician: Referring Provider: Etheleen Nicks Treating Provider/Extender: Linwood Dibbles, Carole Doner Weeks in Treatment: 6 Active Problems ICD-10 Evaluated Encounter Code Description Active Date Today Diagnosis I87.2 Venous insufficiency (chronic) (peripheral) 10/30/2017 No Yes I89.0 Lymphedema, not elsewhere classified 10/30/2017 No Yes L97.312 Non-pressure chronic ulcer of right ankle with fat layer 10/30/2017 No Yes exposed E66.01 Morbid (severe) obesity due to excess calories 10/30/2017 No Yes Inactive Problems Resolved Problems Electronic Signature(s) Signed: 12/14/2017 12:51:28 PM By: Lenda Kelp PA-C Entered By: Lenda Kelp on 12/14/2017 08:45:08 Caponigro, Ivy R. ([id number]) -------------------------------------------------------------------------------- Progress Note Details Patient Name: [name] Alma. Date of Service: 12/14/2017 8:30 AM Medical Record Number: [id number] Patient Account Number: [id number] Date of Birth/Sex: 22-Dec-1945 (72 y.o. M) Treating RN: [name] Coventry Primary Care Provider: Etheleen Nicks Other Clinician: Referring Provider: Etheleen Nicks Treating Provider/Extender: Linwood Dibbles, Mahitha Hickling Weeks in Treatment: 6 Subjective Chief Complaint Information obtained from Patient Right lateral ankle ulcer History of Present Illness (HPI) As noted above the patient had a injury due to working in the yard and this rapidly progressed to a ulcerated area with an infection. In October 2014 he did have some vascular studies done but never saw a Careers adviser and never had any surgery for varicose veins. His past medical history is significant for hypertension, obstructive sleep apnea, morbid obesity, stasis dermatitis of both lower extremities and his past surgical history is suggestive of hip  surgery on the right, gastric bypass surgery, cholecystectomy, hip replacement. He has recently been put on Levaquin 500 milligrams daily, Bactrim DS 1 twice a day and Bactroban 2% topical ointment locally. No recent labs or vascular or imaging studies done. 07/11/2014 a venous duplex study was done on 07/05/2014 -- it showed no evidence of deep vein thrombosis or superficial thrombosis bilaterally. 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 The patient has an appointment to see the vascular surgeons on June 20. 07/18/2014 his appointment with the vascular surgeons is rescheduled for this afternoon. 07/25/2014 -- he saw the vascular surgeon and is going to have a endovenous procedure done on July 29. 08/01/2014 -- he was diagnosed with a UTI and is on ciprofloxacin for 10 days. readmission: 11/27/15 On evaluation today patient is having discomfort in the right ankle region following an open wound from having dropped a cardboard box striking his ankle. He tells me that this has not healed despite many of the measures that he learned from previous injuries when he was seen at our wound center. Therefore he didn't come in for reevaluation today. He has not noted any significant purulent discharge at this point in time. 12/04/15 patient appears to be doing somewhat better at this point in time today in regard to his ankle wound. it is measuring smaller and looking cleaner. 12/11/15; patient wound continues to look improve this is on the right lateral ankle. Debrided of surface slough and nonviable subcutaneous tissue. Using silver alginate 12-18-15 Mr. Suddeth presents today with his wife for follow-up on right lower extremity venous ulcer. He denies any changes or complications over the past week. He is tolerating compression therapy to his right lower extremity and has a properly fitting compression hose to the left lower  extremity. 12/25/15; patient with a wound on his right lateral lower extremity in the setting of severe venous insufficiency [originally trauma against the box]. His edema is well controlled he is  using Prisma. 01/01/16; continued improvement using Prisma. He has severe venous insufficiency however the wound was originally trauma against a box. 01/08/16; his wound is totally epithelialized. We will give him measurements for graded pressure stockings which I think he is going to get at elastic therapy in [facility] Readmission: [name], [name] ([id number]) 10/30/17 patient presents today for reevaluation in our clinic although it has been since 2017 that we've last seen him. He has a ulcer on the right lateral ankle which he tells me began roughly 2 months ago. He states that he had one similar on the left ankle although this completely healed without any complication. However the one on the right as continue to give him a lot of trouble and is not healing appropriately. He really was not sure what the best thing to put on the area would be and therefore he's just been putting advantage to cover it. Another treatment has been utilized at this time. Fortunately there is not appear to be any evidence of significant infection necessarily although there is some evidence of abnormal discoloration in regard to the base of the wound that does not look as healthy as I was on the lives life. He does have erythema surrounding but I'm not sure if this is just due to chronic venous stasis/lymphedema or if it truly is anything infection wise going on at this point. Nonetheless in general the patient seems to be doing fairly well he's not having too much pain although he does have some discomfort. No fevers, chills, nausea, or vomiting noted at this time. 11/13/17 on evaluation today patient actually appears to be doing much better in regard to his wound currently. There does seem to be evidence of  good improvement there still a lot of maceration and some necrotic tissue on the surface of the wound but this was cleaned away very well today. Overall I'm very pleased with how things seem to be progressing in general. We did get the results of his culture back which showed that he did have evidence of Staphylococcus aureus as well as group B strep. It does appear that the Bactrim was sensitive for both. The wound again does look better. 11/20/17 on evaluation today patient's right lateral ankle ulcer actually appears to be doing excellent at this time. He has been tolerating the dressing changes without complication. With that being said overall I do feel like that he has made great progress in the few weeks we've been seeing him at this point. No fevers, chills, nausea, or vomiting noted at this time. 11/30/17 on evaluation today patient appears to be doing extremely well at this point in regard to his right lateral ankle ulcer. He has been tolerating the dressing changes without complication. In general I've been very pleased with the overall progress. His wound overview chart shows that he is making steady progress towards closure. 12/07/17 on evaluation today patient actually appears to be doing well in regard to his ulcer. He does have somewhat of an irregular heart rate noted at this point this was confirmed by manual checks as well. However it was not as low as the initial reading by the machine which was around 48 he was closer to around 58-62. Nonetheless it does sound as if you may have a little bit of an irregularity possibly a field although it was somewhat slow and difficult to gauge the exact rhythm. Otherwise his wound itself seems to be showing signs of great improvement with good epithelialization.  He's tolerating the Iodoflex well. 12/14/17 on evaluation today patient continues to show signs of great improvement in my pinion. Overall I'm very pleased with the progress that is  made. With that being said though he has shown improvement he tells me that for the first couple of days after we put his wrap on that he has severe pain for pretty much that entire amount of time. Nonetheless at this time I'm not seeing any evidence of infection which is good news. I think this may actually be coming from the actual dressing itself. That is the Iodoflex. Patient History Information obtained from Patient. Family History Cancer - Mother, Diabetes - Mother, Heart Disease - Maternal Grandparents, No family history of Hereditary Spherocytosis, Hypertension, Kidney Disease, Lung Disease, Seizures, Stroke, Thyroid Problems, Tuberculosis. Social History Former smoker, Marital Status - Married, Alcohol Use - Rarely, Drug Use - No History, Caffeine Use - Moderate. Medical History Hospitalization/Surgery History - 06/28/2003, [place], cellulitis. - 07/27/2005, [facility] [place], cellulitis. - 07/28/2003, [facility], hip replacement. - 06/30/2014, [facility] ED, cellulitis. 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 ([id number]) Denies complaints or symptoms of Fever, Chills. Respiratory The patient has no complaints or symptoms. Cardiovascular Complains or has symptoms of LE edema. Psychiatric The patient has no complaints or symptoms. Objective Constitutional Well-nourished and well-hydrated in no acute distress. Vitals Time Taken: 8:40 AM, Temperature: 97.9 F, Pulse: 67 bpm, Respiratory Rate: 18 breaths/min, Blood Pressure: 135/62 mmHg. Respiratory normal breathing without difficulty. clear to auscultation bilaterally. Cardiovascular regular rate and rhythm with normal S1, S2. trace pitting edema of the bilateral lower extremities. Psychiatric this patient is able to make decisions and demonstrates good insight into disease process. Alert and Oriented x 3.  pleasant and cooperative. General Notes: Patient's wound bed currently shows evidence of good granulation at this time. He also has a very nice area of a skin [place] which is showing signs of excellent improvement as well. Overall I'm very pleased with how things seem to be progressing. There is no sign of infection at this time. No sharp debridement was required at this point. Integumentary (Hair, Skin) Wound #6 status is Open. Original cause of wound was Gradually Appeared. The wound is located on the Right,Lateral Malleolus. The wound measures 2.7cm length x 1.7cm width x 0.1cm depth; 3.605cm^2 area and 0.36cm^3 volume. There is Fat Layer (Subcutaneous Tissue) Exposed exposed. There is no tunneling or undermining noted. There is a small amount of serous drainage noted. The wound margin is flat and intact. There is medium (34-66%) red, pink granulation within the wound bed. There is a small (1-33%) amount of necrotic tissue within the wound bed including Eschar and Adherent Slough. The periwound skin appearance exhibited: Scarring, Maceration, Ecchymosis, Hemosiderin Staining. The periwound skin appearance did not exhibit: Callus, Crepitus, Excoriation, Induration, Rash, Dry/Scaly, Atrophie Blanche, Cyanosis, Mottled, Pallor, Rubor, Erythema. Periwound temperature was noted as No Abnormality. Assessment Active Problems ICD-10 [name], [name]. ([id number]) Venous insufficiency (chronic) (peripheral) Lymphedema, not elsewhere classified Non-pressure chronic ulcer of right ankle with fat layer exposed Morbid (severe) obesity due to excess calories Procedures Wound #6 Pre-procedure diagnosis of Wound #6 is a Lymphedema located on the Right,Lateral Malleolus . There was a Three Layer Compression Therapy Procedure with a pre-treatment ABI of 1.3 by [name] Coventry, RN. Post procedure Diagnosis Wound #6: Same as Pre-Procedure Plan Wound Cleansing: Wound #6 Right,Lateral Malleolus: Clean wound with  Normal Saline. May Shower, gently pat wound dry prior to applying new dressing. Anesthetic (add to Medication List): Wound #6 Right,Lateral Malleolus: Topical Lidocaine 4% cream applied to wound bed prior to debridement (In Clinic Only). Primary Wound Dressing: Wound #6 Right,Lateral Malleolus: Silver Collagen Secondary Dressing: Wound #6 Right,Lateral Malleolus: Other - KerraMax, Carboflex Dressing Change Frequency: Wound #6 Right,Lateral Malleolus: Change dressing every week Follow-up Appointments: Wound #6 Right,Lateral Malleolus: Return Appointment in 1 week. Nurse Visit as needed - Mr Kost may call for wrap change if needed Edema Control: Wound #6 Right,Lateral Malleolus: 3 Layer Compression System - Right Lower Extremity - unna paste to secure Additional Orders / Instructions: Wound #6 Right,Lateral Malleolus: Increase protein intake. Activity as tolerated Patient's wound bed currently shows great granulation epithelialization as far as I'm concerned. Though he's been doing very well in my pinion from a overall standpoint of the appearance of the wound and not please with the fact is been having as much pain as he has. I was not aware of this until today. Nonetheless I do think that we can changes dressing at this point we [facility], [place] ([id number]) will utilize [employer] to see how this will do. I think this will take care of the burning issues that he is been experiencing. The patient was in agreement with this plan. If anything changes or worsens in the meantime he will let me know. Please see above for specific wound care orders. We will see patient for re-evaluation in 1 week(s) here in the clinic. If anything worsens or changes patient will contact our office for additional recommendations. Electronic Signature(s) Signed: 12/14/2017 12:51:28 PM By: Lenda Kelp PA-C Entered By: Lenda Kelp on 12/14/2017 12:01:50 [name], [name]  ([id number]) -------------------------------------------------------------------------------- ROS/PFSH Details Patient Name: [name] Alma. Date of Service: 12/14/2017 8:30 AM Medical Record Number: [id number] Patient Account Number: [id number] Date of Birth/Sex: [birth year] (72 y.o. M) Treating RN: [name] Coventry Primary Care Provider: Etheleen Nicks Other Clinician: Referring Provider: Etheleen Nicks Treating Provider/Extender: Linwood Dibbles, Sophea Rackham Weeks in Treatment: 6 Information Obtained From Patient Wound History Do you currently have one or more open woundso Yes How many open wounds do you currently haveo 1 Approximately how long have you had your woundso 1 month + How have you been treating your wound(s) until nowo bandaid Has your wound(s) ever healed and then re-openedo No Have you had any lab work done in the past montho No Have you tested positive for an antibiotic resistant organism (MRSA, VRE)o No Have you tested positive for osteomyelitis (bone infection)o No Have you had any tests for circulation on your legso No Constitutional Symptoms (General Health) Complaints and Symptoms: Negative for: Fever; Chills Cardiovascular Complaints and Symptoms: Positive for: LE edema Medical History: Positive for: Hypertension Negative for: Angina; Arrhythmia; Congestive Heart Failure; Coronary Artery Disease; Deep Vein Thrombosis; Hypotension; Myocardial Infarction; Peripheral Arterial Disease; Peripheral Venous Disease; Phlebitis; Vasculitis Eyes Medical History: Negative for: Cataracts; Glaucoma; Optic Neuritis Ear/Nose/Mouth/Throat Medical History: Negative for: Chronic sinus problems/congestion; Middle ear problems Hematologic/Lymphatic Medical History: Positive for: Anemia - iron pills Negative for: Hemophilia; Human Immunodeficiency Virus; Lymphedema; Sickle Cell Disease Respiratory Complaints and Symptoms: No Complaints or Symptoms Medical History: [name], DIMALANTA.  ([id number]) Positive for: Sleep Apnea - C-pap and oxygen 1.5L at night Negative for: Aspiration; Asthma; Chronic Obstructive Pulmonary Disease (COPD); Pneumothorax; Tuberculosis Gastrointestinal Medical History: Negative for: Cirrhosis ; Colitis; Crohnos; Hepatitis A; Hepatitis B; Hepatitis C Endocrine Medical History: Negative for: Type I Diabetes; Type II  Diabetes Genitourinary Medical History: Negative for: End Stage Renal Disease Immunological Medical History: Negative for: Lupus Erythematosus; Raynaudos; Scleroderma Integumentary (Skin) Medical History: Negative for: History of Burn; History of pressure wounds Musculoskeletal Medical History: Positive for: Osteoarthritis - left hip Negative for: Gout; Rheumatoid Arthritis; Osteomyelitis Past Medical History Notes: has artificial right hip and right shoulder Neurologic Medical History: Negative for: Dementia; Neuropathy; Quadriplegia; Paraplegia; Seizure Disorder Oncologic Medical History: Negative for: Received Chemotherapy; Received Radiation Psychiatric Complaints and Symptoms: No Complaints or Symptoms Medical History: Negative for: Anorexia/bulimia; Confinement Anxiety Immunizations Pneumococcal Vaccine: Received Pneumococcal Vaccination: No Tetanus Vaccine: Last tetanus shot: 06/28/2011 [name], [name] ([id number]) Implantable Devices Hospitalization / Surgery History Name of Hospital Purpose of Hospitalization/Surgery Date [place] cellulitis 06/28/2003 [facility] [place] cellulitis 07/27/2005 [facility] hip replacement 07/28/2003 [facility] cellulitis 06/30/2014 Family and Social History Cancer: Yes - Mother; Diabetes: Yes - Mother; Heart Disease: Yes - Maternal Grandparents; Hereditary Spherocytosis: No; Hypertension: No; Kidney Disease: No; Lung Disease: No; Seizures: No; Stroke: No; Thyroid Problems: No; Tuberculosis: No; Former smoker; Marital Status - Married; Alcohol Use: Rarely; Drug Use: No History; Caffeine Use:  Moderate; Financial Concerns: No; Food, Clothing or Shelter Needs: No; Support System Lacking: No; Transportation Concerns: No; Advanced Directives: Yes (Not Provided); Patient does not want information on Advanced Directives; Living Will: Yes (Not Provided); Medical Power of Attorney: Yes (Not Provided) Physician Affirmation I have reviewed and agree with the above information. Electronic Signature(s) Signed: 12/14/2017 12:51:28 PM By: Lenda Kelp PA-C Signed: 12/15/2017 10:18:29 AM By: Elliot Gurney, BSN, RN, CWS, Kim RN, BSN Entered By: Lenda Kelp on 12/14/2017 12:00:47 [name], [name] ([id number]) -------------------------------------------------------------------------------- SuperBill Details Patient Name: [name] Alma. Date of Service: 12/14/2017 Medical Record Number: [id number] Patient Account Number: [id number] Date of Birth/Sex: [birth year] (72 y.o. 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.01 Morbid (severe) obesity due to excess calories Facility Procedures CPT4 Code Description: [id number] (Facility Use Only) [phone] - APPLY MULTLAY COMPRS LWR RT LEG Modifier: Quantity: 1 Physician Procedures CPT4 Code: [id number] Description: 99214 - WC PHYS LEVEL 4 - EST PT ICD-10 Diagnosis Description I87.2 Venous insufficiency (chronic) (peripheral) I89.0 Lymphedema, not elsewhere classified L97.312 Non-pressure chronic ulcer of right ankle with fat layer e E66.01 Morbid  (severe) obesity due to excess calories Modifier: xposed Quantity: 1 Electronic Signature(s) Signed: 12/14/2017 12:51:28 PM By: Lenda Kelp PA-C Entered  By: Lenda Kelp on 12/14/2017 12:02:03

## 2017-12-21 ENCOUNTER — Ambulatory Visit (INDEPENDENT_AMBULATORY_CARE_PROVIDER_SITE_OTHER): Payer: Medicare Other

## 2017-12-21 DIAGNOSIS — R339 Retention of urine, unspecified: Secondary | ICD-10-CM

## 2017-12-21 NOTE — Progress Notes (Signed)
Suprapubic Cath Change  Patient is present today for a suprapubic catheter change due to urinary retention.  10ml of water was drained from the balloon, a 16FR foley cath was removed from the tract with out difficulty.  Site was cleaned and prepped in a sterile fashion with betadine.  A 16FR foley cath was replaced into the tract no complications were noted. Urine return was noted, 10 ml of sterile water was inflated into the balloon and a leg bag was attached for drainage.  Patient tolerated well. A night bag was given to patient and proper instruction was given on how to switch bags.    Preformed by: Benay Pomeroy, CMA (AAMA)  Follow up: In 1 month as scheduled   

## 2017-12-22 ENCOUNTER — Encounter: Payer: Medicare Other | Admitting: Physician Assistant

## 2017-12-22 DIAGNOSIS — E11622 Type 2 diabetes mellitus with other skin ulcer: Secondary | ICD-10-CM | POA: Diagnosis not present

## 2017-12-24 NOTE — Progress Notes (Signed)
[name] ([id number]) Visit Report for 12/22/2017 Chief Complaint Document Details Patient Name: [name], [name]. Date of Service: 12/22/2017 10:15 AM Medical Record Number: [id number] Patient Account Number: [id number] Date of Birth/Sex: August 31, 1945 (72 y.o. M) Treating RN: Curtis Sites Primary Care Provider: Etheleen Nicks Other Clinician: Referring Provider: Etheleen Nicks Treating Provider/Extender: Linwood Dibbles, HOYT Weeks in Treatment: 7 Information Obtained from: Patient Chief Complaint Right lateral ankle ulcer Electronic Signature(s) Signed: 12/23/2017 12:54:11 AM By: Lenda Kelp PA-C Entered By: Lenda Kelp on 12/22/2017 10:24:54 [name] ([id number]) -------------------------------------------------------------------------------- Debridement Details Patient Name: [name]. Date of Service: 12/22/2017 10:15 AM Medical Record Number: [id number] Patient Account Number: [id number] Date of Birth/Sex: 04-16-1945 (72 y.o. M) Treating RN: Curtis Sites Primary Care Provider: Etheleen Nicks Other Clinician: Referring Provider: Etheleen Nicks Treating Provider/Extender: Linwood Dibbles, HOYT Weeks in Treatment: 7 Debridement Performed for Wound #6 Right,Lateral Malleolus Assessment: Performed By: Physician STONE III, HOYT E., PA-C Debridement Type: Debridement Level of Consciousness (Pre- Awake and Alert procedure): Pre-procedure Verification/Time Yes - 11:00 Out Taken: Start Time: 11:00 Pain Control: Lidocaine 4% Topical Solution Total Area Debrided (L x W): 2 (cm) x 1.3 (cm) = 2.6 (cm) Tissue and other material Viable, Non-Viable, Slough, Subcutaneous, Fibrin/Exudate, Slough debrided: Level: Skin/Subcutaneous Tissue Debridement Description: Excisional Instrument: Curette Bleeding: Minimum Hemostasis Achieved: Pressure End Time: 11:06 Procedural Pain: 0 Post Procedural Pain: 0 Response to Treatment: Procedure was tolerated well Level of  Consciousness Awake and Alert (Post-procedure): Post Debridement Measurements of Total Wound Length: (cm) 2 Width: (cm) 1.3 Depth: (cm) 0.4 Volume: (cm) 0.817 Character of Wound/Ulcer Post Debridement: Improved Post Procedure Diagnosis Same as Pre-procedure Electronic Signature(s) Signed: 12/22/2017 5:02:25 PM By: Curtis Sites Signed: 12/23/2017 12:54:11 AM By: Lenda Kelp PA-C Entered By: Curtis Sites on 12/22/2017 11:06:34 Lafoe, Travon R. ([id number]) -------------------------------------------------------------------------------- HPI Details Patient Name: [name]. Date of Service: 12/22/2017 10:15 AM Medical Record Number: [id number] Patient Account Number: [id number] Date of Birth/Sex: [birth year] (72 y.o. M) Treating RN: Curtis Sites Primary Care Provider: Etheleen Nicks Other Clinician: Referring Provider: Etheleen Nicks Treating Provider/Extender: Linwood Dibbles, HOYT Weeks in Treatment: 7 History of Present Illness HPI Description: As noted above the patient had a injury due to working in the yard and this rapidly progressed to a ulcerated area with an infection. In October 2014 he did have some vascular studies done but never saw a Careers adviser and never had any surgery for varicose veins. His past medical history is significant for hypertension, obstructive sleep apnea, morbid obesity, stasis dermatitis of both lower extremities and his past surgical history is suggestive of hip surgery on the right, gastric bypass surgery, cholecystectomy, hip replacement. He has recently been put on Levaquin 500 milligrams daily, Bactrim DS 1 twice a day and Bactroban 2% topical ointment locally. No recent labs or vascular or imaging studies done. 07/11/2014 a venous duplex study was done on 07/05/2014 -- it showed no evidence of deep vein thrombosis or superficial thrombosis bilaterally. 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 The patient has an appointment to see the vascular surgeons on June 20. 07/18/2014 his appointment with the vascular surgeons is rescheduled for this afternoon. 07/25/2014 -- he saw the vascular surgeon and is going to have a endovenous procedure done on July 29. 08/01/2014 -- he was diagnosed with a UTI and is on ciprofloxacin for 10 days. readmission: 11/27/15 On evaluation today patient is having discomfort in the right  ankle region following an open wound from having dropped a cardboard box striking his ankle. He tells me that this has not healed despite many of the measures that he learned from previous injuries when he was seen at our wound center. Therefore he didn't come in for reevaluation today. He has not noted any significant purulent discharge at this point in time. 12/04/15 patient appears to be doing somewhat better at this point in time today in regard to his ankle wound. it is measuring smaller and looking cleaner. 12/11/15; patient wound continues to look improve this is on the right lateral ankle. Debrided of surface slough and nonviable subcutaneous tissue. Using silver alginate 12-18-15 [name] presents today with his wife for follow-up on right lower extremity venous ulcer. He denies any changes or complications over the past week. He is tolerating compression therapy to his right lower extremity and has a properly fitting compression hose to the left lower extremity. 12/25/15; patient with a wound on his right lateral lower extremity in the setting of severe venous insufficiency [originally trauma against the box]. His edema is well controlled he is using Prisma. 01/01/16; continued improvement using Prisma. He has severe venous insufficiency however the wound was originally trauma against a box. 01/08/16; his wound is totally epithelialized. We will give him measurements for graded pressure stockings which I think he is going to get at  elastic therapy in [facility] Readmission: 10/30/17 patient presents today for reevaluation in our clinic although it has been since 2017 that we've last seen him. He has a ulcer on the right lateral ankle which he tells me began roughly 2 months ago. He states that he had one similar on the left ankle although this completely healed without any complication. However the one on the right as continue to give him a lot of trouble and is not healing appropriately. He really was not sure what the best thing to put on the area would be and therefore he's just been putting advantage to cover it. Another treatment has been utilized at this time. Fortunately there is not appear to be any evidence of significant infection necessarily although there is some evidence of abnormal discoloration in regard to [facility], Javonte R. ([id number]) the base of the wound that does not look as healthy as I was on the lives life. He does have erythema surrounding but I'm not sure if this is just due to chronic venous stasis/lymphedema or if it truly is anything infection wise going on at this point. Nonetheless in general the patient seems to be doing fairly well he's not having too much pain although he does have some discomfort. No fevers, chills, nausea, or vomiting noted at this time. 11/13/17 on evaluation today patient actually appears to be doing much better in regard to his wound currently. There does seem to be evidence of good improvement there still a lot of maceration and some necrotic tissue on the surface of the wound but this was cleaned away very well today. Overall I'm very pleased with how things seem to be progressing in general. We did get the results of his culture back which showed that he did have evidence of Staphylococcus aureus as well as group B strep. It does appear that the Bactrim was sensitive for both. The wound again does look better. 11/20/17 on evaluation today patient's right  lateral ankle ulcer actually appears to be doing excellent at this time. He has been tolerating the dressing changes without complication. With  that being said overall I do feel like that he has made great progress in the few weeks we've been seeing him at this point. No fevers, chills, nausea, or vomiting noted at this time. 11/30/17 on evaluation today patient appears to be doing extremely well at this point in regard to his right lateral ankle ulcer. He has been tolerating the dressing changes without complication. In general I've been very pleased with the overall progress. His wound overview chart shows that he is making steady progress towards closure. 12/07/17 on evaluation today patient actually appears to be doing well in regard to his ulcer. He does have somewhat of an irregular heart rate noted at this point this was confirmed by manual checks as well. However it was not as low as the initial reading by the machine which was around 48 he was closer to around 58-62. Nonetheless it does sound as if you may have a little bit of an irregularity possibly a field although it was somewhat slow and difficult to gauge the exact rhythm. Otherwise his wound itself seems to be showing signs of great improvement with good epithelialization. He's tolerating the Iodoflex well. 12/14/17 on evaluation today patient continues to show signs of great improvement in my pinion. Overall I'm very pleased with the progress that is made. With that being said though he has shown improvement he tells me that for the first couple of days after we put his wrap on that he has severe pain for pretty much that entire amount of time. Nonetheless at this time I'm not seeing any evidence of infection which is good news. I think this may actually be coming from the actual dressing itself. That is the Iodoflex. 12/22/17 on evaluation today patient actually appears to be doing much better in regard to his ulcer. He's been shown  signs of improvement which is great news. Fortunately there does not appear to be any evidence of infection at this time. Overall I feel like he has shown excellent progress even since just last week. Electronic Signature(s) Signed: 12/23/2017 12:54:11 AM By: Lenda Kelp PA-C Entered By: Lenda Kelp on 12/22/2017 11:10:38 [name], [name] ([id number]) -------------------------------------------------------------------------------- Physical Exam Details Patient Name: [name]. Date of Service: 12/22/2017 10:15 AM Medical Record Number: [id number] Patient Account Number: [id number] Date of Birth/Sex: 1945-12-30 (72 y.o. M) Treating RN: Curtis Sites Primary Care Provider: Etheleen Nicks Other Clinician: Referring Provider: Etheleen Nicks Treating Provider/Extender: STONE III, HOYT Weeks in Treatment: 7 Constitutional Well-nourished and well-hydrated in no acute distress. Respiratory normal breathing without difficulty. Psychiatric this patient is able to make decisions and demonstrates good insight into disease process. Alert and Oriented x 3. pleasant and cooperative. Notes Patient's wound bed currently did require sharp debridement to clear away necrotic material from the surface of the wound as well as some dressing which had buildup on the surface. He tolerated this without complication post debridement the wound bed appears to be doing much better. Electronic Signature(s) Signed: 12/23/2017 12:54:11 AM By: Lenda Kelp PA-C Entered By: Lenda Kelp on 12/22/2017 11:11:06 [name], [name] ([id number]) -------------------------------------------------------------------------------- Physician Orders Details Patient Name: [name]. Date of Service: 12/22/2017 10:15 AM Medical Record Number: [id number] Patient Account Number: [id number] Date of Birth/Sex: 1945/09/19 (72 y.o. M) Treating RN: Curtis Sites Primary Care Provider: Etheleen Nicks Other  Clinician: Referring Provider: Etheleen Nicks Treating Provider/Extender: Linwood Dibbles, HOYT Weeks in Treatment: 7 Verbal / Phone Orders: No Diagnosis Coding ICD-10 Coding Code Description I87.2 Venous  insufficiency (chronic) (peripheral) I89.0 Lymphedema, not elsewhere classified L97.312 Non-pressure chronic ulcer of right ankle with fat layer exposed E66.01 Morbid (severe) obesity due to excess calories Wound Cleansing Wound #6 Right,Lateral Malleolus o Clean wound with Normal Saline. o May Shower, gently pat wound dry prior to applying new dressing. Anesthetic (add to Medication List) Wound #6 Right,Lateral Malleolus o Topical Lidocaine 4% cream applied to wound bed prior to debridement (In Clinic Only). Primary Wound Dressing Wound #6 Right,Lateral Malleolus o Silver Collagen Secondary Dressing Wound #6 Right,Lateral Malleolus o Other - KerraMax, Carboflex Dressing Change Frequency Wound #6 Right,Lateral Malleolus o Change dressing every week Follow-up Appointments Wound #6 Right,Lateral Malleolus o Return Appointment in 1 week. o Nurse Visit as needed - Mr [name] may call for wrap change if needed Edema Control Wound #6 Right,Lateral Malleolus o 3 Layer Compression System - Right Lower Extremity - unna paste to secure Additional Orders / Instructions Wound #6 Right,Lateral Malleolus o Increase protein intake. [name], [name] ([id number]) o Activity as tolerated Electronic Signature(s) Signed: 12/22/2017 5:02:25 PM By: Curtis Sites Signed: 12/23/2017 12:54:11 AM By: Lenda Kelp PA-C Entered By: Curtis Sites on 12/22/2017 11:08:02 [name], [name] ([id number]) -------------------------------------------------------------------------------- Problem List Details Patient Name: [name]. Date of Service: 12/22/2017 10:15 AM Medical Record Number: [id number] Patient Account Number: [id number] Date of Birth/Sex: March 23, 1945 (72 y.o.  M) Treating RN: Curtis Sites Primary Care Provider: Etheleen Nicks Other Clinician: Referring Provider: Etheleen Nicks Treating Provider/Extender: Linwood Dibbles, HOYT Weeks in Treatment: 7 Active Problems ICD-10 Evaluated Encounter Code Description Active Date Today Diagnosis I87.2 Venous insufficiency (chronic) (peripheral) 10/30/2017 No Yes I89.0 Lymphedema, not elsewhere classified 10/30/2017 No Yes L97.312 Non-pressure chronic ulcer of right ankle with fat layer 10/30/2017 No Yes exposed E66.01 Morbid (severe) obesity due to excess calories 10/30/2017 No Yes Inactive Problems Resolved Problems Electronic Signature(s) Signed: 12/23/2017 12:54:11 AM By: Lenda Kelp PA-C Entered By: Lenda Kelp on 12/22/2017 10:24:48 [place]uyett, [name] ([id number]) -------------------------------------------------------------------------------- Progress Note Details Patient Name: [name]. Date of Service: 12/22/2017 10:15 AM Medical Record Number: [id number] Patient Account Number: [id number] Date of Birth/Sex: Nov 06, 1945 (72 y.o. M) Treating RN: Curtis Sites Primary Care Provider: Etheleen Nicks Other Clinician: Referring Provider: Etheleen Nicks Treating Provider/Extender: Linwood Dibbles, HOYT Weeks in Treatment: 7 Subjective Chief Complaint Information obtained from Patient Right lateral ankle ulcer History of Present Illness (HPI) As noted above the patient had a injury due to working in the yard and this rapidly progressed to a ulcerated area with an infection. In October 2014 he did have some vascular studies done but never saw a Careers adviser and never had any surgery for varicose veins. His past medical history is significant for hypertension, obstructive sleep apnea, morbid obesity, stasis dermatitis of both lower extremities and his past surgical history is suggestive of hip surgery on the right, gastric bypass surgery, cholecystectomy, hip replacement. He has recently been put  on Levaquin 500 milligrams daily, Bactrim DS 1 twice a day and Bactroban 2% topical ointment locally. No recent labs or vascular or imaging studies done. 07/11/2014 a venous duplex study was done on 07/05/2014 -- it showed no evidence of deep vein thrombosis or superficial thrombosis bilaterally. 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 The patient has an appointment to see the vascular surgeons on June 20. 07/18/2014 his appointment with the vascular surgeons is rescheduled for this afternoon. 07/25/2014 -- he saw the vascular surgeon and is going  to have a endovenous procedure done on July 29. 08/01/2014 -- he was diagnosed with a UTI and is on ciprofloxacin for 10 days. readmission: 11/27/15 On evaluation today patient is having discomfort in the right ankle region following an open wound from having dropped a cardboard box striking his ankle. He tells me that this has not healed despite many of the measures that he learned from previous injuries when he was seen at our wound center. Therefore he didn't come in for reevaluation today. He has not noted any significant purulent discharge at this point in time. 12/04/15 patient appears to be doing somewhat better at this point in time today in regard to his ankle wound. it is measuring smaller and looking cleaner. 12/11/15; patient wound continues to look improve this is on the right lateral ankle. Debrided of surface slough and nonviable subcutaneous tissue. Using silver alginate 12-18-15 [name] presents today with his wife for follow-up on right lower extremity venous ulcer. He denies any changes or complications over the past week. He is tolerating compression therapy to his right lower extremity and has a properly fitting compression hose to the left lower extremity. 12/25/15; patient with a wound on his right lateral lower extremity in the setting of severe  venous insufficiency [originally trauma against the box]. His edema is well controlled he is using Prisma. 01/01/16; continued improvement using Prisma. He has severe venous insufficiency however the wound was originally trauma against a box. 01/08/16; his wound is totally epithelialized. We will give him measurements for graded pressure stockings which I think he is going to get at elastic therapy in [facility] Readmission: [name], [name] ([id number]) 10/30/17 patient presents today for reevaluation in our clinic although it has been since 2017 that we've last seen him. He has a ulcer on the right lateral ankle which he tells me began roughly 2 months ago. He states that he had one similar on the left ankle although this completely healed without any complication. However the one on the right as continue to give him a lot of trouble and is not healing appropriately. He really was not sure what the best thing to put on the area would be and therefore he's just been putting advantage to cover it. Another treatment has been utilized at this time. Fortunately there is not appear to be any evidence of significant infection necessarily although there is some evidence of abnormal discoloration in regard to the base of the wound that does not look as healthy as I was on the lives life. He does have erythema surrounding but I'm not sure if this is just due to chronic venous stasis/lymphedema or if it truly is anything infection wise going on at this point. Nonetheless in general the patient seems to be doing fairly well he's not having too much pain although he does have some discomfort. No fevers, chills, nausea, or vomiting noted at this time. 11/13/17 on evaluation today patient actually appears to be doing much better in regard to his wound currently. There does seem to be evidence of good improvement there still a lot of maceration and some necrotic tissue on the surface of the wound but  this was cleaned away very well today. Overall I'm very pleased with how things seem to be progressing in general. We did get the results of his culture back which showed that he did have evidence of Staphylococcus aureus as well as group B strep. It does appear that the Bactrim was sensitive  for both. The wound again does look better. 11/20/17 on evaluation today patient's right lateral ankle ulcer actually appears to be doing excellent at this time. He has been tolerating the dressing changes without complication. With that being said overall I do feel like that he has made great progress in the few weeks we've been seeing him at this point. No fevers, chills, nausea, or vomiting noted at this time. 11/30/17 on evaluation today patient appears to be doing extremely well at this point in regard to his right lateral ankle ulcer. He has been tolerating the dressing changes without complication. In general I've been very pleased with the overall progress. His wound overview chart shows that he is making steady progress towards closure. 12/07/17 on evaluation today patient actually appears to be doing well in regard to his ulcer. He does have somewhat of an irregular heart rate noted at this point this was confirmed by manual checks as well. However it was not as low as the initial reading by the machine which was around 48 he was closer to around 58-62. Nonetheless it does sound as if you may have a little bit of an irregularity possibly a field although it was somewhat slow and difficult to gauge the exact rhythm. Otherwise his wound itself seems to be showing signs of great improvement with good epithelialization. He's tolerating the Iodoflex well. 12/14/17 on evaluation today patient continues to show signs of great improvement in my pinion. Overall I'm very pleased with the progress that is made. With that being said though he has shown improvement he tells me that for the first couple of days after  we put his wrap on that he has severe pain for pretty much that entire amount of time. Nonetheless at this time I'm not seeing any evidence of infection which is good news. I think this may actually be coming from the actual dressing itself. That is the Iodoflex. 12/22/17 on evaluation today patient actually appears to be doing much better in regard to his ulcer. He's been shown signs of improvement which is great news. Fortunately there does not appear to be any evidence of infection at this time. Overall I feel like he has shown excellent progress even since just last week. Patient History Information obtained from Patient. Family History Cancer - Mother, Diabetes - Mother, Heart Disease - Maternal Grandparents, No family history of Hereditary Spherocytosis, Hypertension, Kidney Disease, Lung Disease, Seizures, Stroke, Thyroid Problems, Tuberculosis. Social History Former smoker, Marital Status - Married, Alcohol Use - Rarely, Drug Use - No History, Caffeine Use - Moderate. Medical History Hospitalization/Surgery History - 06/28/2003, [place], cellulitis. - 07/27/2005, [facility] [place], cellulitis. - 07/28/2003, [facility], hip replacement. - 06/30/2014, [facility] ED, cellulitis. Medical And Surgical History Notes Musculoskeletal [name], [name]. ([id number]) has artificial right hip and right shoulder Review of Systems (ROS) Constitutional Symptoms (General Health) Denies complaints or symptoms of Fever, Chills. Respiratory The patient has no complaints or symptoms. Cardiovascular Complains or has symptoms of LE edema. Psychiatric The patient has no complaints or symptoms. Objective Constitutional Well-nourished and well-hydrated in no acute distress. Vitals Time Taken: 10:40 AM, Height: 69 in, Source: Stated, Weight: 320 lbs, Source: Stated, BMI: 47.3, Temperature: 97.7  F, Pulse: 63 bpm, Respiratory Rate: 18 breaths/min, Blood Pressure: 143/63 mmHg. Respiratory normal breathing without  difficulty. Psychiatric this patient is able to make decisions and demonstrates good insight into disease process. Alert and Oriented x 3. pleasant and cooperative. General Notes: Patient's wound bed currently did require  sharp debridement to clear away necrotic material from the surface of the wound as well as some dressing which had buildup on the surface. He tolerated this without complication post debridement the wound bed appears to be doing much better. Integumentary (Hair, Skin) Wound #6 status is Open. Original cause of wound was Gradually Appeared. The wound is located on the Right,Lateral Malleolus. The wound measures 2cm length x 1.3cm width x 0.3cm depth; 2.042cm^2 area and 0.613cm^3 volume. There is Fat Layer (Subcutaneous Tissue) Exposed exposed. There is no tunneling or undermining noted. There is a small amount of serous drainage noted. The wound margin is flat and intact. There is medium (34-66%) red, pink granulation within the wound bed. There is a small (1-33%) amount of necrotic tissue within the wound bed including Eschar and Adherent Slough. The periwound skin appearance exhibited: Scarring, Maceration, Ecchymosis, Hemosiderin Staining. The periwound skin appearance did not exhibit: Callus, Crepitus, Excoriation, Induration, Rash, Dry/Scaly, Atrophie Blanche, Cyanosis, Mottled, Pallor, Rubor, Erythema. Periwound temperature was noted as No Abnormality. The periwound has tenderness on palpation. Assessment Active Problems [name], [name]. ([id number]) ICD-10 Venous insufficiency (chronic) (peripheral) Lymphedema, not elsewhere classified Non-pressure chronic ulcer of right ankle with fat layer exposed Morbid (severe) obesity due to excess calories Procedures Wound #6 Pre-procedure diagnosis of Wound #6 is a Lymphedema located on the Right,Lateral Malleolus . There was a Excisional Skin/Subcutaneous Tissue Debridement with a total area of 2.6 sq cm performed by STONE  III, HOYT E., PA-C. With the following instrument(s): Curette to remove Viable and Non-Viable tissue/material. Material removed includes Subcutaneous Tissue, Slough, and Fibrin/Exudate after achieving pain control using Lidocaine 4% Topical Solution. No specimens were taken. A time out was conducted at 11:00, prior to the start of the procedure. A Minimum amount of bleeding was controlled with Pressure. The procedure was tolerated well with a pain level of 0 throughout and a pain level of 0 following the procedure. Post Debridement Measurements: 2cm length x 1.3cm width x 0.4cm depth; 0.817cm^3 volume. Character of Wound/Ulcer Post Debridement is improved. Post procedure Diagnosis Wound #6: Same as Pre-Procedure Plan Wound Cleansing: Wound #6 Right,Lateral Malleolus: Clean wound with Normal Saline. May Shower, gently pat wound dry prior to applying new dressing. Anesthetic (add to Medication List): Wound #6 Right,Lateral Malleolus: Topical Lidocaine 4% cream applied to wound bed prior to debridement (In Clinic Only). Primary Wound Dressing: Wound #6 Right,Lateral Malleolus: Silver Collagen Secondary Dressing: Wound #6 Right,Lateral Malleolus: Other - KerraMax, Carboflex Dressing Change Frequency: Wound #6 Right,Lateral Malleolus: Change dressing every week Follow-up Appointments: Wound #6 Right,Lateral Malleolus: Return Appointment in 1 week. Nurse Visit as needed - Mr Pellow may call for wrap change if needed Edema Control: Wound #6 Right,Lateral Malleolus: 3 Layer Compression System - Right Lower Extremity - unna paste to secure Additional Orders / Instructions: Wound #6 Right,Lateral Malleolus: Increase protein intake. Activity as tolerated [name], [name] ([id number]) I'm gonna suggest that we continue with the above wound care measures for the next week. The patient is in agreement with plan. We will see were things stand at follow-up. Please see above for specific wound  care orders. We will see patient for re-evaluation in 1 week(s) here in the clinic. If anything worsens or changes patient will contact our office for additional recommendations. Electronic Signature(s) Signed: 12/23/2017 12:54:11 AM By: Lenda Kelp PA-C Entered By: Lenda Kelp on 12/22/2017 11:11:25 [name], [name] ([id number]) -------------------------------------------------------------------------------- ROS/PFSH Details Patient Name: [name]. Date of Service: 12/22/2017 10:15 AM  Medical Record Number: [id number] Patient Account Number: [id number] Date of Birth/Sex: 1945/11/26 (72 y.o. M) Treating RN: Curtis Sites Primary Care Provider: Etheleen Nicks Other Clinician: Referring Provider: Etheleen Nicks Treating Provider/Extender: Linwood Dibbles, HOYT Weeks in Treatment: 7 Information Obtained From Patient Wound History Do you currently have one or more open woundso Yes How many open wounds do you currently haveo 1 Approximately how long have you had your woundso 1 month + How have you been treating your wound(s) until nowo bandaid Has your wound(s) ever healed and then re-openedo No Have you had any lab work done in the past montho No Have you tested positive for an antibiotic resistant organism (MRSA, VRE)o No Have you tested positive for osteomyelitis (bone infection)o No Have you had any tests for circulation on your legso No Constitutional Symptoms (General Health) Complaints and Symptoms: Negative for: Fever; Chills Cardiovascular Complaints and Symptoms: Positive for: LE edema Medical History: Positive for: Hypertension Negative for: Angina; Arrhythmia; Congestive Heart Failure; Coronary Artery Disease; Deep Vein Thrombosis; Hypotension; Myocardial Infarction; Peripheral Arterial Disease; Peripheral Venous Disease; Phlebitis; Vasculitis Eyes Medical History: Negative for: Cataracts; Glaucoma; Optic Neuritis Ear/Nose/Mouth/Throat Medical  History: Negative for: Chronic sinus problems/congestion; Middle ear problems Hematologic/Lymphatic Medical History: Positive for: Anemia - iron pills Negative for: Hemophilia; Human Immunodeficiency Virus; Lymphedema; Sickle Cell Disease Respiratory Complaints and Symptoms: No Complaints or Symptoms Lorimer, Jada R. ([id number]) Medical History: Positive for: Sleep Apnea - C-pap and oxygen 1.5L at night Negative for: Aspiration; Asthma; Chronic Obstructive Pulmonary Disease (COPD); Pneumothorax; Tuberculosis Gastrointestinal Medical History: Negative for: Cirrhosis ; Colitis; Crohnos; Hepatitis A; Hepatitis B; Hepatitis C Endocrine Medical History: Negative for: Type I Diabetes; Type II Diabetes Genitourinary Medical History: Negative for: End Stage Renal Disease Immunological Medical History: Negative for: Lupus Erythematosus; Raynaudos; Scleroderma Integumentary (Skin) Medical History: Negative for: History of Burn; History of pressure wounds Musculoskeletal Medical History: Positive for: Osteoarthritis - left hip Negative for: Gout; Rheumatoid Arthritis; Osteomyelitis Past Medical History Notes: has artificial right hip and right shoulder Neurologic Medical History: Negative for: Dementia; Neuropathy; Quadriplegia; Paraplegia; Seizure Disorder Oncologic Medical History: Negative for: Received Chemotherapy; Received Radiation Psychiatric Complaints and Symptoms: No Complaints or Symptoms Medical History: Negative for: Anorexia/bulimia; Confinement Anxiety Immunizations Pneumococcal Vaccine: Received Pneumococcal Vaccination: No Tetanus VaccineCUNG, MASTERSON ([id number]) Last tetanus shot: 06/28/2011 Implantable Devices Hospitalization / Surgery History Name of Hospital Purpose of Hospitalization/Surgery Date [place] cellulitis 06/28/2003 [facility] [place] cellulitis 07/27/2005 [facility] hip replacement 07/28/2003 [facility] cellulitis 06/30/2014 Family and Social  History Cancer: Yes - Mother; Diabetes: Yes - Mother; Heart Disease: Yes - Maternal Grandparents; Hereditary Spherocytosis: No; Hypertension: No; Kidney Disease: No; Lung Disease: No; Seizures: No; Stroke: No; Thyroid Problems: No; Tuberculosis: No; Former smoker; Marital Status - Married; Alcohol Use: Rarely; Drug Use: No History; Caffeine Use: Moderate; Financial Concerns: No; Food, Clothing or Shelter Needs: No; Support System Lacking: No; Transportation Concerns: No; Advanced Directives: Yes (Not Provided); Patient does not want information on Advanced Directives; Living Will: Yes (Not Provided); Medical Power of Attorney: Yes (Not Provided) Physician Affirmation I have reviewed and agree with the above information. Electronic Signature(s) Signed: 12/22/2017 5:02:25 PM By: Curtis Sites Signed: 12/23/2017 12:54:11 AM By: Lenda Kelp PA-C Entered By: Lenda Kelp on 12/22/2017 11:10:54 On, [name] ([id number]) -------------------------------------------------------------------------------- SuperBill Details Patient Name: [name]. Date of Service: 12/22/2017 Medical Record Number: [id number] Patient Account Number: [id number] Date of Birth/Sex: 03-21-1945 (72 y.o. M) Treating RN: Curtis Sites Primary Care Provider: Etheleen Nicks  Other Clinician: Referring Provider: Etheleen NicksMACDONALD, KERI Treating Provider/Extender: Linwood DibblesSTONE III, HOYT Weeks in Treatment: 7 Diagnosis Coding ICD-10 Codes Code Description I87.2 Venous insufficiency (chronic) (peripheral) I89.0 Lymphedema, not elsewhere classified L97.312 Non-pressure chronic ulcer of right ankle with fat layer exposed E66.01 Morbid (severe) obesity due to excess calories Facility Procedures CPT4 Code: [id number] Description: 11042 - DEB SUBQ TISSUE 20 SQ CM/< ICD-10 Diagnosis Description L97.312 Non-pressure chronic ulcer of right ankle with fat layer ex Modifier: posed Quantity: 1 Physician Procedures CPT4 Code:  [id number] Description: 11042 - WC PHYS SUBQ TISS 20 SQ CM ICD-10 Diagnosis Description L97.312 Non-pressure chronic ulcer of right ankle with fat layer ex Modifier: posed Quantity: 1 Electronic Signature(s) Signed: 12/23/2017 12:54:11 AM By: Lenda KelpStone III, Hoyt PA-C Entered By: Lenda KelpStone III, Hoyt on 12/22/2017 11:11:32

## 2017-12-29 ENCOUNTER — Encounter: Payer: Medicare Other | Attending: Physician Assistant | Admitting: Physician Assistant

## 2017-12-29 DIAGNOSIS — L97312 Non-pressure chronic ulcer of right ankle with fat layer exposed: Secondary | ICD-10-CM | POA: Diagnosis present

## 2017-12-29 DIAGNOSIS — Z87891 Personal history of nicotine dependence: Secondary | ICD-10-CM | POA: Diagnosis not present

## 2017-12-29 DIAGNOSIS — I10 Essential (primary) hypertension: Secondary | ICD-10-CM | POA: Insufficient documentation

## 2017-12-29 DIAGNOSIS — D649 Anemia, unspecified: Secondary | ICD-10-CM | POA: Diagnosis not present

## 2017-12-29 DIAGNOSIS — E11622 Type 2 diabetes mellitus with other skin ulcer: Secondary | ICD-10-CM | POA: Diagnosis not present

## 2017-12-29 DIAGNOSIS — I499 Cardiac arrhythmia, unspecified: Secondary | ICD-10-CM | POA: Diagnosis not present

## 2017-12-29 DIAGNOSIS — M1612 Unilateral primary osteoarthritis, left hip: Secondary | ICD-10-CM | POA: Diagnosis not present

## 2017-12-29 DIAGNOSIS — I872 Venous insufficiency (chronic) (peripheral): Secondary | ICD-10-CM | POA: Insufficient documentation

## 2017-12-29 DIAGNOSIS — I890 Lymphedema, not elsewhere classified: Secondary | ICD-10-CM | POA: Diagnosis not present

## 2017-12-29 DIAGNOSIS — G4733 Obstructive sleep apnea (adult) (pediatric): Secondary | ICD-10-CM | POA: Diagnosis not present

## 2018-01-02 NOTE — Progress Notes (Signed)
REMBERTO, LIENHARD ([id number]) Visit Report for 12/29/2017 Chief Complaint Document Details Patient Name: [name], [name]. Date of Service: 12/29/2017 10:45 AM Medical Record Number: [id number] Patient Account Number: [id number] Date of Birth/Sex: 08-14-1945 (72 y.o. M) Treating RN: Curtis Sites Primary Care Provider: Etheleen Nicks Other Clinician: Referring Provider: Etheleen Nicks Treating Provider/Extender: Linwood Dibbles, HOYT Weeks in Treatment: 8 Information Obtained from: Patient Chief Complaint Right lateral ankle ulcer Electronic Signature(s) Signed: 12/31/2017 1:15:36 AM By: Lenda Kelp PA-C Entered By: Lenda Kelp on 12/29/2017 11:20:14 [name], [name] ([id number]) -------------------------------------------------------------------------------- Debridement Details Patient Name: [name]. Date of Service: 12/29/2017 10:45 AM Medical Record Number: [id number] Patient Account Number: [id number] Date of Birth/Sex: 1945-04-24 (72 y.o. M) Treating RN: Curtis Sites Primary Care Provider: Etheleen Nicks Other Clinician: Referring Provider: Etheleen Nicks Treating Provider/Extender: Linwood Dibbles, HOYT Weeks in Treatment: 8 Debridement Performed for Wound #6 Right,Lateral Malleolus Assessment: Performed By: Physician STONE III, HOYT E., PA-C Debridement Type: Debridement Level of Consciousness (Pre- Awake and Alert procedure): Pre-procedure Verification/Time Yes - 11:51 Out Taken: Start Time: 11:51 Pain Control: Lidocaine 4% Topical Solution Total Area Debrided (L x W): 1 (cm) x 1 (cm) = 1 (cm) Tissue and other material Viable, Non-Viable, Slough, Subcutaneous, Skin: Dermis , Skin: Epidermis, Fibrin/Exudate, debrided: Slough, Other: dressing material Level: Skin/Subcutaneous Tissue Debridement Description: Excisional Instrument: Curette Bleeding: Minimum Hemostasis Achieved: Pressure End Time: 11:58 Procedural Pain: 0 Post Procedural Pain: 0 Response to  Treatment: Procedure was tolerated well Level of Consciousness Awake and Alert (Post-procedure): Post Debridement Measurements of Total Wound Length: (cm) 1.3 Width: (cm) 0.6 Depth: (cm) 0.2 Volume: (cm) 0.123 Character of Wound/Ulcer Post Debridement: Improved Post Procedure Diagnosis Same as Pre-procedure Electronic Signature(s) Signed: 12/29/2017 5:21:38 PM By: Curtis Sites Signed: 12/31/2017 1:15:36 AM By: Lenda Kelp PA-C Entered By: Curtis Sites on 12/29/2017 11:59:57 [name], [name] ([id number]) -------------------------------------------------------------------------------- HPI Details Patient Name: [name]. Date of Service: 12/29/2017 10:45 AM Medical Record Number: [id number] Patient Account Number: [id number] Date of Birth/Sex: 1945-01-29 (72 y.o. M) Treating RN: Curtis Sites Primary Care Provider: Etheleen Nicks Other Clinician: Referring Provider: Etheleen Nicks Treating Provider/Extender: Linwood Dibbles, HOYT Weeks in Treatment: 8 History of Present Illness HPI Description: As noted above the patient had a injury due to working in the yard and this rapidly progressed to a ulcerated area with an infection. In October 2014 he did have some vascular studies done but never saw a Careers adviser and never had any surgery for varicose veins. His past medical history is significant for hypertension, obstructive sleep apnea, morbid obesity, stasis dermatitis of both lower extremities and his past surgical history is suggestive of hip surgery on the right, gastric bypass surgery, cholecystectomy, hip replacement. He has recently been put on Levaquin 500 milligrams daily, Bactrim DS 1 twice a day and Bactroban 2% topical ointment locally. No recent labs or vascular or imaging studies done. 07/11/2014 a venous duplex study was done on 07/05/2014 -- it showed no evidence of deep vein thrombosis or superficial thrombosis bilaterally. There was incompetence of the greater  saphenous veins bilaterally and incompetence of the right small saphenous vein. The patient has an appointment to see the vascular surgeons on June 20. 07/18/2014 his appointment with the vascular surgeons is rescheduled for this afternoon. 07/25/2014 -- he saw the vascular surgeon and is going to have a endovenous procedure done on July 29. 08/01/2014 -- he was diagnosed with a UTI and is on ciprofloxacin for 10 days. readmission: 11/27/15 On evaluation  today patient is having discomfort in the right ankle region following an open wound from having dropped a cardboard box striking his ankle. He tells me that this has not healed despite many of the measures that he learned from previous injuries when he was seen at our wound center. Therefore he didn't come in for reevaluation today. He has not noted any significant purulent discharge at this point in time. 12/04/15 patient appears to be doing somewhat better at this point in time today in regard to his ankle wound. it is measuring smaller and looking cleaner. 12/11/15; patient wound continues to look improve this is on the right lateral ankle. Debrided of surface slough and nonviable subcutaneous tissue. Using silver alginate 12-18-15 Mr. Maulding presents today with his wife for follow-up on right lower extremity venous ulcer. He denies any changes or complications over the past week. He is tolerating compression therapy to his right lower extremity and has a properly fitting compression hose to the left lower extremity. 12/25/15; patient with a wound on his right lateral lower extremity in the setting of severe venous insufficiency [originally trauma against the box]. His edema is well controlled he is using Prisma. 01/01/16; continued improvement using Prisma. He has severe venous insufficiency however the wound was originally trauma against a box. 01/08/16; his wound is totally epithelialized. We will give him measurements for graded pressure  stockings which I think he is going to get at elastic therapy in [facility] Readmission: 10/30/17 patient presents today for reevaluation in our clinic although it has been since 2017 that we've last seen him. He has a ulcer on the right lateral ankle which he tells me began roughly 2 months ago. He states that he had one similar on the left ankle although this completely healed without any complication. However the one on the right as continue to give him a lot of trouble and is not healing appropriately. He really was not sure what the best thing to put on the area would be and therefore he's just been putting advantage to cover it. Another treatment has been utilized at this time. Fortunately there is not appear to be any evidence of significant infection necessarily although there is some evidence of abnormal discoloration in regard to [facility], Tito R. ([id number]) the base of the wound that does not look as healthy as I was on the lives life. He does have erythema surrounding but I'm not sure if this is just due to chronic venous stasis/lymphedema or if it truly is anything infection wise going on at this point. Nonetheless in general the patient seems to be doing fairly well he's not having too much pain although he does have some discomfort. No fevers, chills, nausea, or vomiting noted at this time. 11/13/17 on evaluation today patient actually appears to be doing much better in regard to his wound currently. There does seem to be evidence of good improvement there still a lot of maceration and some necrotic tissue on the surface of the wound but this was cleaned away very well today. Overall I'm very pleased with how things seem to be progressing in general. We did get the results of his culture back which showed that he did have evidence of Staphylococcus aureus as well as group B strep. It does appear that the Bactrim was sensitive for both. The wound again does look  better. 11/20/17 on evaluation today patient's right lateral ankle ulcer actually appears to be doing excellent at this time. He has  been tolerating the dressing changes without complication. With that being said overall I do feel like that he has made great progress in the few weeks we've been seeing him at this point. No fevers, chills, nausea, or vomiting noted at this time. 11/30/17 on evaluation today patient appears to be doing extremely well at this point in regard to his right lateral ankle ulcer. He has been tolerating the dressing changes without complication. In general I've been very pleased with the overall progress. His wound overview chart shows that he is making steady progress towards closure. 12/07/17 on evaluation today patient actually appears to be doing well in regard to his ulcer. He does have somewhat of an irregular heart rate noted at this point this was confirmed by manual checks as well. However it was not as low as the initial reading by the machine which was around 48 he was closer to around 58-62. Nonetheless it does sound as if you may have a little bit of an irregularity possibly a field although it was somewhat slow and difficult to gauge the exact rhythm. Otherwise his wound itself seems to be showing signs of great improvement with good epithelialization. He's tolerating the Iodoflex well. 12/14/17 on evaluation today patient continues to show signs of great improvement in my pinion. Overall I'm very pleased with the progress that is made. With that being said though he has shown improvement he tells me that for the first couple of days after we put his wrap on that he has severe pain for pretty much that entire amount of time. Nonetheless at this time I'm not seeing any evidence of infection which is good news. I think this may actually be coming from the actual dressing itself. That is the Iodoflex. 12/22/17 on evaluation today patient actually appears to be  doing much better in regard to his ulcer. He's been shown signs of improvement which is great news. Fortunately there does not appear to be any evidence of infection at this time. Overall I feel like he has shown excellent progress even since just last week. 12/29/17 on evaluation today patient actually appears to be doing excellent although he does have some of the dressing material stuck to the wound bed. Fortunately there does not appear to be any signs of infection which is great news. Overall I'm very pleased with the progression of his wound. Electronic Signature(s) Signed: 12/31/2017 1:15:36 AM By: Lenda Kelp PA-C Entered By: Lenda Kelp on 12/30/2017 21:21:39 [name] ([id number]) -------------------------------------------------------------------------------- Physical Exam Details Patient Name: [name]. Date of Service: 12/29/2017 10:45 AM Medical Record Number: [id number] Patient Account Number: [id number] Date of Birth/Sex: 1945/08/18 (72 y.o. M) Treating RN: Curtis Sites Primary Care Provider: Etheleen Nicks Other Clinician: Referring Provider: Etheleen Nicks Treating Provider/Extender: Linwood Dibbles, HOYT Weeks in Treatment: 8 Constitutional Well-nourished and well-hydrated in no acute distress. Respiratory normal breathing without difficulty. Psychiatric this patient is able to make decisions and demonstrates good insight into disease process. Alert and Oriented x 3. pleasant and cooperative. Notes I did perform debridement to remove slough as well as dressing material from the surface of the wound. He tolerated this without any pain and post debridement the wound bed appears to be doing much better. Overall I'm extremely pleased with his progress. Electronic Signature(s) Signed: 12/31/2017 1:15:36 AM By: Lenda Kelp PA-C Entered By: Lenda Kelp on 12/30/2017 21:22:16 [name]  ([id number]) -------------------------------------------------------------------------------- Physician Orders Details Patient Name: [name]. Date of Service:  12/29/2017 10:45 AM Medical Record Number: [id number] Patient Account Number: [id number] Date of Birth/Sex: Mar 17, 1945 (71 y.o. M) Treating RN: Curtis Sites Primary Care Provider: Etheleen Nicks Other Clinician: Referring Provider: Etheleen Nicks Treating Provider/Extender: Linwood Dibbles, HOYT Weeks in Treatment: 8 Verbal / Phone Orders: No Diagnosis Coding ICD-10 Coding Code Description I87.2 Venous insufficiency (chronic) (peripheral) I89.0 Lymphedema, not elsewhere classified L97.312 Non-pressure chronic ulcer of right ankle with fat layer exposed E66.01 Morbid (severe) obesity due to excess calories Wound Cleansing Wound #6 Right,Lateral Malleolus o Clean wound with Normal Saline. o May Shower, gently pat wound dry prior to applying new dressing. Anesthetic (add to Medication List) Wound #6 Right,Lateral Malleolus o Topical Lidocaine 4% cream applied to wound bed prior to debridement (In Clinic Only). Primary Wound Dressing Wound #6 Right,Lateral Malleolus o Hydrogel o Silver Collagen Secondary Dressing Wound #6 Right,Lateral Malleolus o ABD pad Dressing Change Frequency Wound #6 Right,Lateral Malleolus o Change dressing every week Follow-up Appointments Wound #6 Right,Lateral Malleolus o Return Appointment in 1 week. o Nurse Visit as needed - Mr Osbourne may call for wrap change if needed Edema Control Wound #6 Right,Lateral Malleolus o 3 Layer Compression System - Right Lower Extremity - unna paste to secure Additional Orders / Instructions Wound #6 Right,Lateral Malleolus [name], [name] ([id number]) o Increase protein intake. o Activity as tolerated Electronic Signature(s) Signed: 12/29/2017 5:21:38 PM By: Curtis Sites Signed: 12/31/2017 1:15:36 AM By: Lenda Kelp  PA-C Entered By: Curtis Sites on 12/29/2017 12:00:31 [name], [name] ([id number]) -------------------------------------------------------------------------------- Problem List Details Patient Name: [name]. Date of Service: 12/29/2017 10:45 AM Medical Record Number: [id number] Patient Account Number: [id number] Date of Birth/Sex: 17-Sep-1945 (72 y.o. M) Treating RN: Curtis Sites Primary Care Provider: Etheleen Nicks Other Clinician: Referring Provider: Etheleen Nicks Treating Provider/Extender: Linwood Dibbles, HOYT Weeks in Treatment: 8 Active Problems ICD-10 Evaluated Encounter Code Description Active Date Today Diagnosis I87.2 Venous insufficiency (chronic) (peripheral) 10/30/2017 No Yes I89.0 Lymphedema, not elsewhere classified 10/30/2017 No Yes L97.312 Non-pressure chronic ulcer of right ankle with fat layer 10/30/2017 No Yes exposed E66.01 Morbid (severe) obesity due to excess calories 10/30/2017 No Yes Inactive Problems Resolved Problems Electronic Signature(s) Signed: 12/31/2017 1:15:36 AM By: Lenda Kelp PA-C Entered By: Lenda Kelp on 12/29/2017 11:20:08 [name], [name] ([id number]) -------------------------------------------------------------------------------- Progress Note Details Patient Name: [name]. Date of Service: 12/29/2017 10:45 AM Medical Record Number: [id number] Patient Account Number: [id number] Date of Birth/Sex: February 06, 1945 (72 y.o. M) Treating RN: Curtis Sites Primary Care Provider: Etheleen Nicks Other Clinician: Referring Provider: Etheleen Nicks Treating Provider/Extender: Linwood Dibbles, HOYT Weeks in Treatment: 8 Subjective Chief Complaint Information obtained from Patient Right lateral ankle ulcer History of Present Illness (HPI) As noted above the patient had a injury due to working in the yard and this rapidly progressed to a ulcerated area with an infection. In October 2014 he did have some vascular studies done but never  saw a Careers adviser and never had any surgery for varicose veins. His past medical history is significant for hypertension, obstructive sleep apnea, morbid obesity, stasis dermatitis of both lower extremities and his past surgical history is suggestive of hip surgery on the right, gastric bypass surgery, cholecystectomy, hip replacement. He has recently been put on Levaquin 500 milligrams daily, Bactrim DS 1 twice a day and Bactroban 2% topical ointment locally. No recent labs or vascular or imaging studies done. 07/11/2014 a venous duplex study was done on 07/05/2014 -- it showed no evidence of deep vein thrombosis or  superficial thrombosis bilaterally. 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 The patient has an appointment to see the vascular surgeons on June 20. 07/18/2014 his appointment with the vascular surgeons is rescheduled for this afternoon. 07/25/2014 -- he saw the vascular surgeon and is going to have a endovenous procedure done on July 29. 08/01/2014 -- he was diagnosed with a UTI and is on ciprofloxacin for 10 days. readmission: 11/27/15 On evaluation today patient is having discomfort in the right ankle region following an open wound from having dropped a cardboard box striking his ankle. He tells me that this has not healed despite many of the measures that he learned from previous injuries when he was seen at our wound center. Therefore he didn't come in for reevaluation today. He has not noted any significant purulent discharge at this point in time. 12/04/15 patient appears to be doing somewhat better at this point in time today in regard to his ankle wound. it is measuring smaller and looking cleaner. 12/11/15; patient wound continues to look improve this is on the right lateral ankle. Debrided of surface slough and nonviable subcutaneous tissue. Using silver alginate 12-18-15 Mr. Mees presents today  with his wife for follow-up on right lower extremity venous ulcer. He denies any changes or complications over the past week. He is tolerating compression therapy to his right lower extremity and has a properly fitting compression hose to the left lower extremity. 12/25/15; patient with a wound on his right lateral lower extremity in the setting of severe venous insufficiency [originally trauma against the box]. His edema is well controlled he is using Prisma. 01/01/16; continued improvement using Prisma. He has severe venous insufficiency however the wound was originally trauma against a box. 01/08/16; his wound is totally epithelialized. We will give him measurements for graded pressure stockings which I think he is going to get at [facility] Readmission: [name] AlmaSHAFAR, [name] ([id number]) 10/30/17 patient presents today for reevaluation in our clinic although it has been since 2017 that we've last seen him. He has a ulcer on the right lateral ankle which he tells me began roughly 2 months ago. He states that he had one similar on the left ankle although this completely healed without any complication. However the one on the right as continue to give him a lot of trouble and is not healing appropriately. He really was not sure what the best thing to put on the area would be and therefore he's just been putting advantage to cover it. Another treatment has been utilized at this time. Fortunately there is not appear to be any evidence of significant infection necessarily although there is some evidence of abnormal discoloration in regard to the base of the wound that does not look as healthy as I was on the lives life. He does have erythema surrounding but I'm not sure if this is just due to chronic venous stasis/lymphedema or if it truly is anything infection wise going on at this point. Nonetheless in general the patient seems to be doing fairly well he's not having too  much pain although he does have some discomfort. No fevers, chills, nausea, or vomiting noted at this time. 11/13/17 on evaluation today patient actually appears to be doing much better in regard to his wound currently. There does seem to be evidence of good improvement there still a lot of maceration and some necrotic tissue on the surface of the  wound but this was cleaned away very well today. Overall I'm very pleased with how things seem to be progressing in general. We did get the results of his culture back which showed that he did have evidence of Staphylococcus aureus as well as group B strep. It does appear that the Bactrim was sensitive for both. The wound again does look better. 11/20/17 on evaluation today patient's right lateral ankle ulcer actually appears to be doing excellent at this time. He has been tolerating the dressing changes without complication. With that being said overall I do feel like that he has made great progress in the few weeks we've been seeing him at this point. No fevers, chills, nausea, or vomiting noted at this time. 11/30/17 on evaluation today patient appears to be doing extremely well at this point in regard to his right lateral ankle ulcer. He has been tolerating the dressing changes without complication. In general I've been very pleased with the overall progress. His wound overview chart shows that he is making steady progress towards closure. 12/07/17 on evaluation today patient actually appears to be doing well in regard to his ulcer. He does have somewhat of an irregular heart rate noted at this point this was confirmed by manual checks as well. However it was not as low as the initial reading by the machine which was around 48 he was closer to around 58-62. Nonetheless it does sound as if you may have a little bit of an irregularity possibly a field although it was somewhat slow and difficult to gauge the exact rhythm. Otherwise his wound itself seems  to be showing signs of great improvement with good epithelialization. He's tolerating the Iodoflex well. 12/14/17 on evaluation today patient continues to show signs of great improvement in my pinion. Overall I'm very pleased with the progress that is made. With that being said though he has shown improvement he tells me that for the first couple of days after we put his wrap on that he has severe pain for pretty much that entire amount of time. Nonetheless at this time I'm not seeing any evidence of infection which is good news. I think this may actually be coming from the actual dressing itself. That is the Iodoflex. 12/22/17 on evaluation today patient actually appears to be doing much better in regard to his ulcer. He's been shown signs of improvement which is great news. Fortunately there does not appear to be any evidence of infection at this time. Overall I feel like he has shown excellent progress even since just last week. 12/29/17 on evaluation today patient actually appears to be doing excellent although he does have some of the dressing material stuck to the wound bed. Fortunately there does not appear to be any signs of infection which is great news. Overall I'm very pleased with the progression of his wound. Patient History Information obtained from Patient. Family History Cancer - Mother, Diabetes - Mother, Heart Disease - Maternal Grandparents, No family history of Hereditary Spherocytosis, Hypertension, Kidney Disease, Lung Disease, Seizures, Stroke, Thyroid Problems, Tuberculosis. Social History Former smoker, Marital Status - Married, Alcohol Use - Rarely, Drug Use - No History, Caffeine Use - Moderate. Medical History Hospitalization/Surgery History - 06/28/2003, [place], cellulitis. - 07/27/2005, [facility] [place], cellulitis. - 07/28/2003, [facility], hip [place], [place] R. ([id number]) replacement. - 06/30/2014, [facility] ED, cellulitis. Medical And Surgical History Notes Musculoskeletal has  artificial right hip and right shoulder Review of Systems (ROS) Constitutional Symptoms (General Health) Denies complaints or  symptoms of Fever, Chills. Respiratory The patient has no complaints or symptoms. Cardiovascular The patient has no complaints or symptoms. Psychiatric The patient has no complaints or symptoms. Objective Constitutional Well-nourished and well-hydrated in no acute distress. Vitals Time Taken: 11:02 AM, Height: 69 in, Weight: 320 lbs, BMI: 47.3, Temperature: 97.7 F, Pulse: 68 bpm, Respiratory Rate: 18 breaths/min, Blood Pressure: 136/71 mmHg. Respiratory normal breathing without difficulty. Psychiatric this patient is able to make decisions and demonstrates good insight into disease process. Alert and Oriented x 3. pleasant and cooperative. General Notes: I did perform debridement to remove slough as well as dressing material from the surface of the wound. He tolerated this without any pain and post debridement the wound bed appears to be doing much better. Overall I'm extremely pleased with his progress. Integumentary (Hair, Skin) Wound #6 status is Open. Original cause of wound was Gradually Appeared. The wound is located on the Right,Lateral Malleolus. The wound measures 1cm length x 1cm width x 0.2cm depth; 0.785cm^2 area and 0.157cm^3 volume. There is Fat Layer (Subcutaneous Tissue) Exposed exposed. There is no tunneling or undermining noted. There is a medium amount of serous drainage noted. The wound margin is flat and intact. There is small (1-33%) red, pink granulation within the wound bed. There is a large (67-100%) amount of necrotic tissue within the wound bed including Eschar and Adherent Slough. The periwound skin appearance exhibited: Scarring, Maceration, Ecchymosis, Hemosiderin Staining. The periwound skin appearance did not exhibit: Callus, Crepitus, Excoriation, Induration, Rash, Dry/Scaly, Atrophie Blanche, Cyanosis, Mottled, Pallor, Rubor,  Erythema. Periwound temperature was noted as No Abnormality. The periwound has tenderness on palpation. [name], [name] ([id number]) Assessment Active Problems ICD-10 Venous insufficiency (chronic) (peripheral) Lymphedema, not elsewhere classified Non-pressure chronic ulcer of right ankle with fat layer exposed Morbid (severe) obesity due to excess calories Procedures Wound #6 Pre-procedure diagnosis of Wound #6 is a Lymphedema located on the Right,Lateral Malleolus . There was a Excisional Skin/Subcutaneous Tissue Debridement with a total area of 1 sq cm performed by STONE III, HOYT E., PA-C. With the following instrument(s): Curette to remove Viable and Non-Viable tissue/material. Material removed includes Subcutaneous Tissue, Slough, Skin: Dermis, Skin: Epidermis, Fibrin/Exudate, and Other: dressing material after achieving pain control using Lidocaine 4% Topical Solution. No specimens were taken. A time out was conducted at 11:51, prior to the start of the procedure. A Minimum amount of bleeding was controlled with Pressure. The procedure was tolerated well with a pain level of 0 throughout and a pain level of 0 following the procedure. Post Debridement Measurements: 1.3cm length x 0.6cm width x 0.2cm depth; 0.123cm^3 volume. Character of Wound/Ulcer Post Debridement is improved. Post procedure Diagnosis Wound #6: Same as Pre-Procedure Plan Wound Cleansing: Wound #6 Right,Lateral Malleolus: Clean wound with Normal Saline. May Shower, gently pat wound dry prior to applying new dressing. Anesthetic (add to Medication List): Wound #6 Right,Lateral Malleolus: Topical Lidocaine 4% cream applied to wound bed prior to debridement (In Clinic Only). Primary Wound Dressing: Wound #6 Right,Lateral Malleolus: Hydrogel Silver Collagen Secondary Dressing: Wound #6 Right,Lateral Malleolus: ABD pad Dressing Change Frequency: Wound #6 Right,Lateral Malleolus: Change dressing every  week Follow-up Appointments: Wound #6 Right,Lateral Malleolus: Return Appointment in 1 week. Nurse Visit as needed - Mr Tarte may call for wrap change if needed Edema Control: [name], [name]. ([id number]) Wound #6 Right,Lateral Malleolus: 3 Layer Compression System - Right Lower Extremity - unna paste to secure Additional Orders / Instructions: Wound #6 Right,Lateral Malleolus: Increase protein intake. Activity as  tolerated I'm a recommend that we continue with the Current wound care measures the patient is in agreement with that plan. I will subsequently see him back for reevaluation to see were things stand. Please see above for specific wound care orders. We will see patient for re-evaluation in 1 week(s) here in the clinic. If anything worsens or changes patient will contact our office for additional recommendations. Electronic Signature(s) Signed: 12/31/2017 1:15:36 AM By: Lenda Kelp PA-C Entered By: Lenda Kelp on 12/30/2017 21:22:29 [name] ([id number]) -------------------------------------------------------------------------------- ROS/PFSH Details Patient Name: [name]. Date of Service: 12/29/2017 10:45 AM Medical Record Number: [id number] Patient Account Number: [id number] Date of Birth/Sex: 08/11/1945 (72 y.o. M) Treating RN: Curtis Sites Primary Care Provider: Etheleen Nicks Other Clinician: Referring Provider: Etheleen Nicks Treating Provider/Extender: Linwood Dibbles, HOYT Weeks in Treatment: 8 Information Obtained From Patient Wound History Do you currently have one or more open woundso Yes How many open wounds do you currently haveo 1 Approximately how long have you had your woundso 1 month + How have you been treating your wound(s) until nowo bandaid Has your wound(s) ever healed and then re-openedo No Have you had any lab work done in the past montho No Have you tested positive for an antibiotic resistant organism (MRSA, VRE)o No Have you  tested positive for osteomyelitis (bone infection)o No Have you had any tests for circulation on your legso No Constitutional Symptoms (General Health) Complaints and Symptoms: Negative for: Fever; Chills Eyes Medical History: Negative for: Cataracts; Glaucoma; Optic Neuritis Ear/Nose/Mouth/Throat Medical History: Negative for: Chronic sinus problems/congestion; Middle ear problems Hematologic/Lymphatic Medical History: Positive for: Anemia - iron pills Negative for: Hemophilia; Human Immunodeficiency Virus; Lymphedema; Sickle Cell Disease Respiratory Complaints and Symptoms: No Complaints or Symptoms Medical History: Positive for: Sleep Apnea - C-pap and oxygen 1.5L at night Negative for: Aspiration; Asthma; Chronic Obstructive Pulmonary Disease (COPD); Pneumothorax; Tuberculosis Cardiovascular Complaints and Symptoms: No Complaints or Symptoms Medical HistoryKOL, [name] ([id number]) Positive for: Hypertension Negative for: Angina; Arrhythmia; Congestive Heart Failure; Coronary Artery Disease; Deep Vein Thrombosis; Hypotension; Myocardial Infarction; Peripheral Arterial Disease; Peripheral Venous Disease; Phlebitis; Vasculitis Gastrointestinal Medical History: Negative for: Cirrhosis ; Colitis; Crohnos; Hepatitis A; Hepatitis B; Hepatitis C Endocrine Medical History: Negative for: Type I Diabetes; Type II Diabetes Genitourinary Medical History: Negative for: End Stage Renal Disease Immunological Medical History: Negative for: Lupus Erythematosus; Raynaudos; Scleroderma Integumentary (Skin) Medical History: Negative for: History of Burn; History of pressure wounds Musculoskeletal Medical History: Positive for: Osteoarthritis - left hip Negative for: Gout; Rheumatoid Arthritis; Osteomyelitis Past Medical History Notes: has artificial right hip and right shoulder Neurologic Medical History: Negative for: Dementia; Neuropathy; Quadriplegia; Paraplegia; Seizure  Disorder Oncologic Medical History: Negative for: Received Chemotherapy; Received Radiation Psychiatric Complaints and Symptoms: No Complaints or Symptoms Medical History: Negative for: Anorexia/bulimia; Confinement Anxiety Immunizations Pneumococcal Vaccine: Received Pneumococcal Vaccination: No Tetanus VaccineEARMON, [name] ([id number]) Last tetanus shot: 06/28/2011 Implantable Devices Hospitalization / Surgery History Name of Hospital Purpose of Hospitalization/Surgery Date [place] cellulitis 06/28/2003 [facility] [place] [facility] 07/27/2005 [facility] hip replacement 07/28/2003 [facility] cellulitis 06/30/2014 Family and Social History Cancer: Yes - Mother; Diabetes: Yes - Mother; Heart Disease: Yes - Maternal Grandparents; Hereditary Spherocytosis: No; Hypertension: No; Kidney Disease: No; Lung Disease: No; Seizures: No; Stroke: No; Thyroid Problems: No; Tuberculosis: No; Former smoker; Marital Status - Married; Alcohol Use: Rarely; Drug Use: No History; Caffeine Use: Moderate; Financial Concerns: No; Food, Clothing or Shelter Needs: No; Support System Lacking: No;  Transportation Concerns: No; Advanced Directives: Yes (Not Provided); Patient does not want information on Advanced Directives; Living Will: Yes (Not Provided); Medical Power of Attorney: Yes (Not Provided) Physician Affirmation I have reviewed and agree with the above information. Electronic Signature(s) Signed: 12/31/2017 1:15:36 AM By: Lenda Kelp PA-C Signed: 12/31/2017 5:28:33 PM By: Curtis Sites Entered By: Lenda Kelp on 12/30/2017 21:22:00 [name], [name] ([id number]) -------------------------------------------------------------------------------- SuperBill Details Patient Name: [name]. Date of Service: 12/29/2017 Medical Record Number: [id number] Patient Account Number: [id number] Date of Birth/Sex: 08/01/1945 (72 y.o. M) Treating RN: Curtis Sites Primary Care Provider: Etheleen Nicks Other  Clinician: Referring Provider: Etheleen Nicks Treating Provider/Extender: Linwood Dibbles, HOYT Weeks in Treatment: 8 Diagnosis Coding ICD-10 Codes Code Description I87.2 Venous insufficiency (chronic) (peripheral) I89.0 Lymphedema, not elsewhere classified L97.312 Non-pressure chronic ulcer of right ankle with fat layer exposed E66.01 Morbid (severe) obesity due to excess calories Facility Procedures CPT4 Code: 78295621 Description: 11042 - DEB SUBQ TISSUE 20 SQ CM/< ICD-10 Diagnosis Description L97.312 Non-pressure chronic ulcer of right ankle with fat layer ex Modifier: posed Quantity: 1 Physician Procedures CPT4 Code: 3086578 Description: 11042 - WC PHYS SUBQ TISS 20 SQ CM ICD-10 Diagnosis Description L97.312 Non-pressure chronic ulcer of right ankle with fat layer ex Modifier: posed Quantity: 1 Electronic Signature(s) Signed: 12/31/2017 1:15:36 AM By: Lenda Kelp PA-C Entered By: Lenda Kelp on 12/30/2017 21:22:58

## 2018-01-02 NOTE — Progress Notes (Signed)
[name] ([id number]) Visit Report for 12/29/2017 Arrival Information Details Patient Name: [name], [name]. Date of Service: 12/29/2017 10:45 AM Medical Record Number: [id number] Patient Account Number: [id number] Date of Birth/Sex: August 10, 1945 (72 y.o. M) Treating RN: Huel Coventry Primary Care Vittorio Mohs: Etheleen Nicks Other Clinician: Referring Carla Rashad: Etheleen Nicks Treating Roberto Romanoski/Extender: Linwood Dibbles, HOYT Weeks in Treatment: 8 Visit Information History Since Last Visit Added or deleted any medications: No Patient Arrived: Crutches Any new allergies or adverse reactions: No Arrival Time: 11:00 Had a fall or experienced change in No Accompanied By: wife activities of daily living that may affect Transfer Assistance: None risk of falls: Patient Identification Verified: Yes Signs or symptoms of abuse/neglect since last visito No Secondary Verification Process Yes Hospitalized since last visit: No Completed: Implantable device outside of the clinic excluding No Patient Has Alerts: Yes cellular tissue based products placed in the center Patient Alerts: Hgb a1c 5.3 (4-27- since last visit: 19) Has Dressing in Place as Prescribed: Yes Has Compression in Place as Prescribed: Yes Pain Present Now: No Electronic Signature(s) Signed: 12/29/2017 5:26:41 PM By: Elliot Gurney, BSN, RN, CWS, Kim RN, BSN Entered By: Elliot Gurney, BSN, RN, CWS, Kim on 12/29/2017 11:01:18 [name] ([id number]) -------------------------------------------------------------------------------- Encounter Discharge Information Details Patient Name: [name]. Date of Service: 12/29/2017 10:45 AM Medical Record Number: [id number] Patient Account Number: [id number] Date of Birth/Sex: 02/26/1945 (72 y.o. M) Treating RN: Huel Coventry Primary Care Travers Goodley: Etheleen Nicks Other Clinician: Referring Jim Philemon: Etheleen Nicks Treating Xavien Dauphinais/Extender: Linwood Dibbles, HOYT Weeks in Treatment: 8 Encounter Discharge  Information Items Post Procedure Vitals Discharge Condition: Stable Temperature (F): 97.7 Ambulatory Status: Crutches Pulse (bpm): 68 Discharge Destination: Home Respiratory Rate (breaths/min): 18 Transportation: Private Auto Blood Pressure (mmHg): 136/71 Accompanied By: spouse Schedule Follow-up Appointment: Yes Clinical Summary of Care: Electronic Signature(s) Signed: 12/29/2017 5:26:41 PM By: Elliot Gurney, BSN, RN, CWS, Kim RN, BSN Entered By: Elliot Gurney, BSN, RN, CWS, Kim on 12/29/2017 12:09:52 [name] ([id number]) -------------------------------------------------------------------------------- Lower Extremity Assessment Details Patient Name: [name], [name] Date of Service: 12/29/2017 10:45 AM Medical Record Number: [id number] Patient Account Number: [id number] Date of Birth/Sex: [birth year] (72 y.o. M) Treating RN: Huel Coventry Primary Care Azuree Minish: Etheleen Nicks Other Clinician: Referring Shontelle Muska: Etheleen Nicks Treating Isador Castille/Extender: Linwood Dibbles, HOYT Weeks in Treatment: 8 Edema Assessment Assessed: [Left: No] [Right: No] [Left: Edema] [Right: :] Calf Left: Right: Point of Measurement: 35 cm From Medial Instep cm 40.5 cm Ankle Left: Right: Point of Measurement: 10 cm From Medial Instep cm 25.4 cm Vascular Assessment Pulses: Dorsalis Pedis Palpable: [Right:Yes] Posterior Tibial Extremity colors, hair growth, and conditions: Extremity Color: [Right:Hyperpigmented] Hair Growth on Extremity: [Right:No] Temperature of Extremity: [Right:Warm] Capillary Refill: [Right:< 3 seconds] Toe Nail Assessment Left: Right: Thick: Yes Discolored: Yes Deformed: Yes Improper Length and Hygiene: Yes Notes Foot has stain from weekend project all over. Will not wash off with soap and water. Electronic Signature(s) Signed: 12/29/2017 5:26:41 PM By: Elliot Gurney, BSN, RN, CWS, Kim RN, BSN Entered By: Elliot Gurney, BSN, RN, CWS, Kim on 12/29/2017 11:15:36 [name]  ([id number]) -------------------------------------------------------------------------------- Multi Wound Chart Details Patient Name: [name]. Date of Service: 12/29/2017 10:45 AM Medical Record Number: [id number] Patient Account Number: [id number] Date of Birth/Sex: Feb 25, 1945 (72 y.o. M) Treating RN: Curtis Sites Primary Care Jadore Mcguffin: Etheleen Nicks Other Clinician: Referring Kemiya Batdorf: Etheleen Nicks Treating Dalessandro Baldyga/Extender: Linwood Dibbles, HOYT Weeks in Treatment: 8 Vital Signs Height(in): 69 Pulse(bpm): 68 Weight(lbs): 320 Blood Pressure(mmHg): 136/71 Body Mass Index(BMI): 47 Temperature(F): 97.7 Respiratory Rate 18 (  breaths/min): Photos: [N/A:N/A] Wound Location: Right Malleolus - Lateral N/A N/A Wounding Event: Gradually Appeared N/A N/A Primary Etiology: Lymphedema N/A N/A Comorbid History: Anemia, Sleep Apnea, N/A N/A Hypertension, Osteoarthritis Date Acquired: 09/27/2017 N/A N/A Weeks of Treatment: 8 N/A N/A Wound Status: Open N/A N/A Measurements L x W x D 1x1x0.2 N/A N/A (cm) Area (cm) : 0.785 N/A N/A Volume (cm) : 0.157 N/A N/A % Reduction in Area: 82.20% N/A N/A % Reduction in Volume: 82.20% N/A N/A Classification: Full Thickness Without N/A N/A Exposed Support Structures Exudate Amount: Medium N/A N/A Exudate Type: Serous N/A N/A Exudate Color: amber N/A N/A Wound Margin: Flat and Intact N/A N/A Granulation Amount: Small (1-33%) N/A N/A Granulation Quality: Red, Pink N/A N/A Necrotic Amount: Large (67-100%) N/A N/A Necrotic Tissue: Eschar, Adherent Slough N/A N/A Exposed Structures: Fat Layer (Subcutaneous N/A N/A Tissue) Exposed: Yes Fascia: No Tendon: No Muscle: No Canche, Zakariya R. ([id number]) Joint: No Bone: No Epithelialization: Small (1-33%) N/A N/A Periwound Skin Texture: Scarring: Yes N/A N/A Excoriation: No Induration: No Callus: No Crepitus: No Rash: No Periwound Skin Moisture: Maceration: Yes N/A N/A Dry/Scaly:  No Periwound Skin Color: Ecchymosis: Yes N/A N/A Hemosiderin Staining: Yes Atrophie Blanche: No Cyanosis: No Erythema: No Mottled: No Pallor: No Rubor: No Temperature: No Abnormality N/A N/A Tenderness on Palpation: Yes N/A N/A Wound Preparation: Ulcer Cleansing: N/A N/A Rinsed/Irrigated with Saline Topical Anesthetic Applied: Other: lidocaine 4% Treatment Notes Electronic Signature(s) Signed: 12/29/2017 5:21:38 PM By: Curtis Sitesorthy, Joanna Entered By: Curtis Sitesorthy, Joanna on 12/29/2017 11:51:20 [name], [name] ([id number]) -------------------------------------------------------------------------------- Multi-Disciplinary Care Plan Details Patient Name: [name] AlmaSHAFAR, [name] Date of Service: 12/29/2017 10:45 AM Medical Record Number: [id number] Patient Account Number: [id number] Date of Birth/Sex: 12/31/1945 (72 y.o. M) Treating RN: Curtis Sitesorthy, Joanna Primary Care Shanyah Gattuso: Etheleen NicksMACDONALD, KERI Other Clinician: Referring Pierina Schuknecht: Etheleen NicksMACDONALD, KERI Treating Leyli Kevorkian/Extender: Linwood DibblesSTONE III, HOYT Weeks in Treatment: 8 Active Inactive Abuse / Safety / Falls / Self Care Management Nursing Diagnoses: Impaired physical mobility Goals: Patient will not develop complications from immobility Date Initiated: 10/30/2017 Target Resolution Date: 01/22/2018 Goal Status: Active Interventions: Assess fall risk on admission and as needed Notes: Orientation to the Wound Care Program Nursing Diagnoses: Knowledge deficit related to the wound healing center program Goals: Patient/caregiver will verbalize understanding of the Wound Healing Center Program Date Initiated: 10/30/2017 Target Resolution Date: 01/22/2018 Goal Status: Active Interventions: Provide education on orientation to the wound center Notes: Venous Leg Ulcer Nursing Diagnoses: Potential for venous Insuffiency (use before diagnosis confirmed) Goals: Patient will maintain optimal edema control Date Initiated: 10/30/2017 Target Resolution Date:  01/22/2018 Goal Status: Active Interventions: Assess peripheral edema status every visit. [name] AlmaSHAFAR, Kavian R. ([id number]) Compression as ordered Notes: Wound/Skin Impairment Nursing Diagnoses: Impaired tissue integrity Goals: Ulcer/skin breakdown will heal within 14 weeks Date Initiated: 10/30/2017 Target Resolution Date: 01/22/2018 Goal Status: Active Interventions: Assess patient/caregiver ability to obtain necessary supplies Assess patient/caregiver ability to perform ulcer/skin care regimen upon admission and as needed Assess ulceration(s) every visit Notes: Electronic Signature(s) Signed: 12/29/2017 5:21:38 PM By: Curtis Sitesorthy, Joanna Entered By: Curtis Sitesorthy, Joanna on 12/29/2017 11:51:10 [name], [name] ([id number]) -------------------------------------------------------------------------------- Pain Assessment Details Patient Name: [name] AlmaSHAFAR, Maxwel R. Date of Service: 12/29/2017 10:45 AM Medical Record Number: [id number] Patient Account Number: [id number] Date of Birth/Sex: 02/13/1945 (72 y.o. M) Treating RN: Huel CoventryWoody, Kim Primary Care Letica Giaimo: Etheleen NicksMACDONALD, KERI Other Clinician: Referring Red Mandt: Etheleen NicksMACDONALD, KERI Treating Owyn Raulston/Extender: Linwood DibblesSTONE III, HOYT Weeks in Treatment: 8 Active Problems Location of Pain Severity and Description of Pain Patient Has Paino Yes Site Locations  Pain Location: Generalized Pain With Dressing Change: No Pain Management and Medication Current Pain Management: Goals for Pain Management [place] does not Designer, multimedia) Signed: 12/29/2017 5:26:41 PM By: Elliot Gurney, BSN, RN, CWS, Kim RN, BSN Entered By: Elliot Gurney, BSN, RN, CWS, Kim on 12/29/2017 11:01:43 [name] ([id number]) -------------------------------------------------------------------------------- Patient/Caregiver Education Details Patient Name: [name]. Date of Service: 12/29/2017 10:45 AM Medical Record Number: [id number] Patient Account Number: [id number] Date of  Birth/Gender: 08/23/1945 (72 y.o. M) Treating RN: Curtis Sites Primary Care Physician: Etheleen Nicks Other Clinician: Referring Physician: Etheleen Nicks Treating Physician/Extender: Skeet Simmer in Treatment: 8 Education Assessment Education Provided To: Patient and Caregiver Education Topics Provided Venous: Handouts: Other: continue using [place] 4000 on other leg Electronic Signature(s) Signed: 12/29/2017 5:21:38 PM By: Curtis Sites Entered By: Curtis Sites on 12/29/2017 12:04:48 [name], [name] ([id number]) -------------------------------------------------------------------------------- Wound Assessment Details Patient Name: [name]. Date of Service: 12/29/2017 10:45 AM Medical Record Number: [id number] Patient Account Number: [id number] Date of Birth/Sex: 03-15-1945 (72 y.o. M) Treating RN: Huel Coventry Primary Care Kristain Hu: Etheleen Nicks Other Clinician: Referring Sriyan Cutting: Etheleen Nicks Treating Fidela Cieslak/Extender: Linwood Dibbles, HOYT Weeks in Treatment: 8 Wound Status Wound Number: 6 Primary Lymphedema Etiology: Wound Location: Right Malleolus - Lateral Wound Status: Open Wounding Event: Gradually Appeared Comorbid Anemia, Sleep Apnea, Hypertension, Date Acquired: 09/27/2017 History: Osteoarthritis Weeks Of Treatment: 8 Clustered Wound: No Photos Photo Uploaded By: Elliot Gurney, BSN, RN, CWS, Kim on 12/29/2017 11:24:51 Wound Measurements Length: (cm) 1 Width: (cm) 1 Depth: (cm) 0.2 Area: (cm) 0.785 Volume: (cm) 0.157 % Reduction in Area: 82.2% % Reduction in Volume: 82.2% Epithelialization: Small (1-33%) Tunneling: No Undermining: No Wound Description Full Thickness Without Exposed Support Classification: Structures Wound Margin: Flat and Intact Exudate Medium Amount: Exudate Type: Serous Exudate Color: amber Foul Odor After Cleansing: No Slough/Fibrino Yes Wound Bed Granulation Amount: Small (1-33%) Exposed Structure Granulation  Quality: Red, Pink Fascia Exposed: No Necrotic Amount: Large (67-100%) Fat Layer (Subcutaneous Tissue) Exposed: Yes Necrotic Quality: Eschar, Adherent Slough Tendon Exposed: No Muscle Exposed: No Joint Exposed: No Bone Exposed: No [name], [name] ([id number]) Periwound Skin Texture Texture Color No Abnormalities Noted: No No Abnormalities Noted: No Callus: No Atrophie Blanche: No Crepitus: No Cyanosis: No Excoriation: No Ecchymosis: Yes Induration: No Erythema: No Rash: No Hemosiderin Staining: Yes Scarring: Yes Mottled: No Pallor: No Moisture Rubor: No No Abnormalities Noted: No Dry / Scaly: No Temperature / Pain Maceration: Yes Temperature: No Abnormality Tenderness on Palpation: Yes Wound Preparation Ulcer Cleansing: Rinsed/Irrigated with Saline Topical Anesthetic Applied: Other: lidocaine 4%, Treatment Notes Wound #6 (Right, Lateral Malleolus) Notes hydrogel, prisma, abd, unna to anchor, 3 layer wrap Electronic Signature(s) Signed: 12/29/2017 5:26:41 PM By: Elliot Gurney, BSN, RN, CWS, Kim RN, BSN Entered By: Elliot Gurney, BSN, RN, CWS, Kim on 12/29/2017 11:13:07 [name], [name] ([id number]) -------------------------------------------------------------------------------- Vitals Details Patient Name: [name]. Date of Service: 12/29/2017 10:45 AM Medical Record Number: [id number] Patient Account Number: [id number] Date of Birth/Sex: 10/16/1945 (72 y.o. M) Treating RN: Huel Coventry Primary Care Josemaria Brining: Etheleen Nicks Other Clinician: Referring Markasia Carrol: Etheleen Nicks Treating Abdirizak Richison/Extender: Linwood Dibbles, HOYT Weeks in Treatment: 8 Vital Signs Time Taken: 11:02 Temperature (F): 97.7 Height (in): 69 Pulse (bpm): 68 Weight (lbs): 320 Respiratory Rate (breaths/min): 18 Body Mass Index (BMI): 47.3 Blood Pressure (mmHg): 136/71 Reference Range: 80 - 120 mg / dl Electronic Signature(s) Signed: 12/29/2017 5:26:41 PM By: Elliot Gurney, BSN, RN, CWS, Kim RN, BSN Entered  By: Elliot Gurney, BSN, RN, CWS, Kim on 12/29/2017  11:04:24 

## 2018-01-05 ENCOUNTER — Encounter: Payer: Medicare Other | Admitting: Physician Assistant

## 2018-01-05 DIAGNOSIS — E11622 Type 2 diabetes mellitus with other skin ulcer: Secondary | ICD-10-CM | POA: Diagnosis not present

## 2018-01-10 NOTE — Progress Notes (Signed)
[name], [name] ([id number]) Visit Report for 01/05/2018 Arrival Information Details Patient Name: [name], [name]. Date of Service: 01/05/2018 10:45 AM Medical Record Number: [id number] Patient Account Number: [id number] Date of Birth/Sex: [birth year] (72 y.o. M) Treating RN: Huel Coventry Primary Care Statia Burdick: Etheleen Nicks Other Clinician: Referring Gailen Venne: Etheleen Nicks Treating Jentry Warnell/Extender: Linwood Dibbles, HOYT Weeks in Treatment: 9 Visit Information History Since Last Visit Added or deleted any medications: No Patient Arrived: Ambulatory Any new allergies or adverse reactions: No Arrival Time: 10:56 Had a fall or experienced change in No Accompanied By: self activities of daily living that may affect Transfer Assistance: None risk of falls: Patient Identification Verified: Yes Signs or symptoms of abuse/neglect since last visito No Secondary Verification Process Yes Hospitalized since last visit: No Completed: Implantable device outside of the clinic excluding No Patient Has Alerts: Yes cellular tissue based products placed in the center Patient Alerts: Hgb a1c 5.3 (4-27- since last visit: 19) Has Dressing in Place as Prescribed: Yes Has Compression in Place as Prescribed: Yes Pain Present Now: No Electronic Signature(s) Signed: 01/06/2018 5:09:03 PM By: Elliot Gurney, BSN, RN, CWS, Kim RN, BSN Entered By: Elliot Gurney, BSN, RN, CWS, Kim on 01/05/2018 10:59:34 Krol, Jorje Guild ([id number]) -------------------------------------------------------------------------------- Compression Therapy Details Patient Name: [name] Pen R. Date of Service: 01/05/2018 10:45 AM Medical Record Number: [id number] Patient Account Number: [id number] Date of Birth/Sex: Jul 16, 1945 (72 y.o. M) Treating RN: Curtis Sites Primary Care Amonie Wisser: Etheleen Nicks Other Clinician: Referring Kenith Trickel: Etheleen Nicks Treating Miryam Mcelhinney/Extender: Linwood Dibbles, HOYT Weeks in Treatment: 9 Compression Therapy  Performed for Wound Assessment: Wound #6 Right,Lateral Malleolus Performed By: Clinician Curtis Sites, RN Compression Type: Three Layer Pre Treatment ABI: 1.3 Post Procedure Diagnosis Same as Pre-procedure Electronic Signature(s) Signed: 01/05/2018 5:28:58 PM By: Curtis Sites Entered By: Curtis Sites on 01/05/2018 11:18:39 Stoneberg, Jorje Guild ([id number]) -------------------------------------------------------------------------------- Encounter Discharge Information Details Patient Name: [name] Alma. Date of Service: 01/05/2018 10:45 AM Medical Record Number: [id number] Patient Account Number: [id number] Date of Birth/Sex: 1945-01-29 (72 y.o. M) Treating RN: Curtis Sites Primary Care Kristalynn Coddington: Etheleen Nicks Other Clinician: Referring Jalyne Brodzinski: Etheleen Nicks Treating Kamilah Correia/Extender: Linwood Dibbles, HOYT Weeks in Treatment: 9 Encounter Discharge Information Items Discharge Condition: Stable Ambulatory Status: Crutches Discharge Destination: Home Transportation: Private Auto Accompanied By: spouse Schedule Follow-up Appointment: Yes Clinical Summary of Care: Electronic Signature(s) Signed: 01/05/2018 5:28:58 PM By: Curtis Sites Entered By: Curtis Sites on 01/05/2018 11:19:36 Pineo, Jorje Guild ([id number]) -------------------------------------------------------------------------------- Lower Extremity Assessment Details Patient Name: [name] Alma. Date of Service: 01/05/2018 10:45 AM Medical Record Number: [id number] Patient Account Number: [id number] Date of Birth/Sex: 1945-06-27 (72 y.o. M) Treating RN: Huel Coventry Primary Care Dayrin Stallone: Etheleen Nicks Other Clinician: Referring Quinteria Chisum: Etheleen Nicks Treating Behr Cislo/Extender: Linwood Dibbles, HOYT Weeks in Treatment: 9 Edema Assessment Assessed: [Left: No] [Right: No] [Left: Edema] [Right: :] Calf Left: Right: Point of Measurement: 35 cm From Medial Instep cm 39.4 cm Ankle Left: Right: Point of Measurement:  10 cm From Medial Instep cm 26 cm Vascular Assessment Pulses: Dorsalis Pedis Palpable: [Right:Yes] Posterior Tibial Extremity colors, hair growth, and conditions: Extremity Color: [Right:Hyperpigmented] Hair Growth on Extremity: [Right:No] Temperature of Extremity: [Right:Warm] Capillary Refill: [Right:< 3 seconds] Toe Nail Assessment Left: Right: Thick: Yes Discolored: Yes Deformed: Yes Improper Length and Hygiene: No Electronic Signature(s) Signed: 01/06/2018 5:09:03 PM By: Elliot Gurney, BSN, RN, CWS, Kim RN, BSN Entered By: Elliot Gurney, BSN, RN, CWS, Kim on 01/05/2018 11:10:19 Tschirhart, Jorje Guild ([id number]) -------------------------------------------------------------------------------- Multi Wound Chart Details Patient Name: [name] Alma. Date of Service:  01/05/2018 10:45 AM Medical Record Number: [id number] Patient Account Number: [id number] Date of Birth/Sex: September 20, 1945 (72 y.o. M) Treating RN: Curtis Sites Primary Care Ambree Frances: Etheleen Nicks Other Clinician: Referring Deziray Nabi: Etheleen Nicks Treating Cleve Paolillo/Extender: Linwood Dibbles, HOYT Weeks in Treatment: 9 Vital Signs Height(in): 69 Pulse(bpm): 68 Weight(lbs): 320 Blood Pressure(mmHg): 115/62 Body Mass Index(BMI): 47 Temperature(F): 97.9 Respiratory Rate 16 (breaths/min): Photos: [6:No Photos] [N/A:N/A] Wound Location: [6:Right Malleolus - Lateral] [N/A:N/A] Wounding Event: [6:Gradually Appeared] [N/A:N/A] Primary Etiology: [6:Lymphedema] [N/A:N/A] Comorbid History: [6:Anemia, Sleep Apnea, Hypertension, Osteoarthritis] [N/A:N/A] Date Acquired: [6:09/27/2017] [N/A:N/A] Weeks of Treatment: [6:9] [N/A:N/A] Wound Status: [6:Open] [N/A:N/A] Measurements L x W x D [6:0.8x0.3x0.2] [N/A:N/A] (cm) Area (cm) : [6:0.188] [N/A:N/A] Volume (cm) : [6:0.038] [N/A:N/A] % Reduction in Area: [6:95.70%] [N/A:N/A] % Reduction in Volume: [6:95.70%] [N/A:N/A] Classification: [6:Full Thickness Without Exposed Support Structures]  [N/A:N/A] Exudate Amount: [6:Medium] [N/A:N/A] Exudate Type: [6:Serous] [N/A:N/A] Exudate Color: [6:amber] [N/A:N/A] Wound Margin: [6:Flat and Intact] [N/A:N/A] Granulation Amount: [6:Large (67-100%)] [N/A:N/A] Granulation Quality: [6:Red, Pink] [N/A:N/A] Necrotic Amount: [6:None Present (0%)] [N/A:N/A] Exposed Structures: [6:Fat Layer (Subcutaneous Tissue) Exposed: Yes Fascia: No Tendon: No Muscle: No Joint: No Bone: No] [N/A:N/A] Epithelialization: [6:Medium (34-66%)] [N/A:N/A] Periwound Skin Texture: [6:Scarring: Yes Excoriation: No Induration: No Callus: No] [N/A:N/A] Crepitus: No Rash: No Periwound Skin Moisture: Maceration: Yes N/A N/A Dry/Scaly: No Periwound Skin Color: Ecchymosis: Yes N/A N/A Hemosiderin Staining: Yes Atrophie Blanche: No Cyanosis: No Erythema: No Mottled: No Pallor: No Rubor: No Temperature: No Abnormality N/A N/A Tenderness on Palpation: Yes N/A N/A Wound Preparation: Ulcer Cleansing: N/A N/A Rinsed/Irrigated with Saline Topical Anesthetic Applied: Other: lidocaine 4% Treatment Notes Electronic Signature(s) Signed: 01/05/2018 5:28:58 PM By: Curtis Sites Entered By: Curtis Sites on 01/05/2018 11:16:54 Tarte, Jorje Guild ([id number]) -------------------------------------------------------------------------------- Multi-Disciplinary Care Plan Details Patient Name: [name] Alma. Date of Service: 01/05/2018 10:45 AM Medical Record Number: [id number] Patient Account Number: [id number] Date of Birth/Sex: Jul 19, 1945 (72 y.o. M) Treating RN: Curtis Sites Primary Care Ayza Ripoll: Etheleen Nicks Other Clinician: Referring Joncarlos Atkison: Etheleen Nicks Treating Wilferd Ritson/Extender: Linwood Dibbles, HOYT Weeks in Treatment: 9 Active Inactive Abuse / Safety / Falls / Self Care Management Nursing Diagnoses: Impaired physical mobility Goals: Patient will not develop complications from immobility Date Initiated: 10/30/2017 Target Resolution Date:  01/22/2018 Goal Status: Active Interventions: Assess fall risk on admission and as needed Notes: Orientation to the Wound Care Program Nursing Diagnoses: Knowledge deficit related to the wound healing center program Goals: Patient/caregiver will verbalize understanding of the Wound Healing Center Program Date Initiated: 10/30/2017 Target Resolution Date: 01/22/2018 Goal Status: Active Interventions: Provide education on orientation to the wound center Notes: Venous Leg Ulcer Nursing Diagnoses: Potential for venous Insuffiency (use before diagnosis confirmed) Goals: Patient will maintain optimal edema control Date Initiated: 10/30/2017 Target Resolution Date: 01/22/2018 Goal Status: Active Interventions: Assess peripheral edema status every visit. ROHAAN, DURNIL ([id number]) Compression as ordered Notes: Wound/Skin Impairment Nursing Diagnoses: Impaired tissue integrity Goals: Ulcer/skin breakdown will heal within 14 weeks Date Initiated: 10/30/2017 Target Resolution Date: 01/22/2018 Goal Status: Active Interventions: Assess patient/caregiver ability to obtain necessary supplies Assess patient/caregiver ability to perform ulcer/skin care regimen upon admission and as needed Assess ulceration(s) every visit Notes: Electronic Signature(s) Signed: 01/05/2018 5:28:58 PM By: Curtis Sites Entered By: Curtis Sites on 01/05/2018 11:16:43 Pinkley, Lew R. ([id number]) -------------------------------------------------------------------------------- Pain Assessment Details Patient Name: [name] Alma. Date of Service: 01/05/2018 10:45 AM Medical Record Number: [id number] Patient Account Number: [id number] Date of Birth/Sex: 01-23-1946 (72 y.o. M) Treating RN: Huel Coventry Primary Care Bettylou [name]: Etheleen Nicks  Other Clinician: Referring Emmalia Heyboer: Etheleen Nicks Treating Abbagayle Zaragoza/Extender: Linwood Dibbles, HOYT Weeks in Treatment: 9 Active Problems Location of Pain Severity and  Description of Pain Patient Has Paino No Site Locations Pain Management and Medication Current Pain Management: Electronic Signature(s) Signed: 01/06/2018 5:09:03 PM By: Elliot Gurney, BSN, RN, CWS, Kim RN, BSN Entered By: Elliot Gurney, BSN, RN, CWS, Kim on 01/05/2018 10:59:51 [name] Alma ([id number]) -------------------------------------------------------------------------------- Patient/Caregiver Education Details Patient Name: [name] Alma. Date of Service: 01/05/2018 10:45 AM Medical Record Number: [id number] Patient Account Number: [id number] Date of Birth/Gender: 01/14/1946 (73 y.o. M) Treating RN: Curtis Sites Primary Care Physician: Etheleen Nicks Other Clinician: Referring Physician: Etheleen Nicks Treating Physician/Extender: Skeet Simmer in Treatment: 9 Education Assessment Education Provided To: Patient Education Topics Provided Venous: Handouts: Other: leg elevation Methods: Explain/Verbal Responses: State content correctly Electronic Signature(s) Signed: 01/05/2018 5:28:58 PM By: Curtis Sites Entered By: Curtis Sites on 01/05/2018 11:19:08 Luna, Jorje Guild ([id number]) -------------------------------------------------------------------------------- Wound Assessment Details Patient Name: [name] Pen R. Date of Service: 01/05/2018 10:45 AM Medical Record Number: [id number] Patient Account Number: [id number] Date of Birth/Sex: 05-Nov-1945 (72 y.o. M) Treating RN: Huel Coventry Primary Care Jenel Gierke: Etheleen Nicks Other Clinician: Referring Terrilee Dudzik: Etheleen Nicks Treating Venezia Sargeant/Extender: Linwood Dibbles, HOYT Weeks in Treatment: 9 Wound Status Wound Number: 6 Primary Lymphedema Etiology: Wound Location: Right Malleolus - Lateral Wound Status: Open Wounding Event: Gradually Appeared Comorbid Anemia, Sleep Apnea, Hypertension, Date Acquired: 09/27/2017 History: Osteoarthritis Weeks Of Treatment: 9 Clustered Wound: No Photos Photo Uploaded By: Elliot Gurney,  BSN, RN, CWS, Kim on 01/06/2018 08:29:54 Wound Measurements Length: (cm) 0.8 Width: (cm) 0.3 Depth: (cm) 0.2 Area: (cm) 0.188 Volume: (cm) 0.038 % Reduction in Area: 95.7% % Reduction in Volume: 95.7% Epithelialization: Medium (34-66%) Tunneling: No Undermining: No Wound Description Full Thickness Without Exposed Support Classification: Structures Wound Margin: Flat and Intact Exudate Medium Amount: Exudate Type: Serous Exudate Color: amber Foul Odor After Cleansing: No Slough/Fibrino No Wound Bed Granulation Amount: Large (67-100%) Exposed Structure Granulation Quality: Red, Pink Fascia Exposed: No Necrotic Amount: None Present (0%) Fat Layer (Subcutaneous Tissue) Exposed: Yes Tendon Exposed: No Muscle Exposed: No Joint Exposed: No Bone Exposed: No Wittwer, Mazin R. ([id number]) Periwound Skin Texture Texture Color No Abnormalities Noted: No No Abnormalities Noted: No Callus: No Atrophie Blanche: No Crepitus: No Cyanosis: No Excoriation: No Ecchymosis: Yes Induration: No Erythema: No Rash: No Hemosiderin Staining: Yes Scarring: Yes Mottled: No Pallor: No Moisture Rubor: No No Abnormalities Noted: No Dry / Scaly: No Temperature / Pain Maceration: Yes Temperature: No Abnormality Tenderness on Palpation: Yes Wound Preparation Ulcer Cleansing: Rinsed/Irrigated with Saline Topical Anesthetic Applied: Other: lidocaine 4%, Treatment Notes Wound #6 (Right, Lateral Malleolus) Notes foam, unna to anchor, 3 layer wrap Electronic Signature(s) Signed: 01/06/2018 5:09:03 PM By: Elliot Gurney, BSN, RN, CWS, Kim RN, BSN Entered By: Elliot Gurney, BSN, RN, CWS, Kim on 01/05/2018 11:09:08 [name] Alma ([id number]) -------------------------------------------------------------------------------- Vitals Details Patient Name: [name] Alma. Date of Service: 01/05/2018 10:45 AM Medical Record Number: [id number] Patient Account Number: [id number] Date of Birth/Sex: [birth year]  (72 y.o. M) Treating RN: Huel Coventry Primary Care Grizelda Piscopo: Etheleen Nicks Other Clinician: Referring Trammell Bowden: Etheleen Nicks Treating Lynnann Knudsen/Extender: Linwood Dibbles, HOYT Weeks in Treatment: 9 Vital Signs Time Taken: 10:59 Temperature (F): 97.9 Height (in): 69 Pulse (bpm): 68 Weight (lbs): 320 Respiratory Rate (breaths/min): 16 Body Mass Index (BMI): 47.3 Blood Pressure (mmHg): 115/62 Reference Range: 80 - 120 mg / dl Electronic Signature(s) Signed: 01/06/2018 5:09:03 PM By: Elliot Gurney, BSN, RN, CWS, Kim RN, BSN Entered By:  Elliot GurneyWoody, BSN, RN, CWS, Kim on 01/05/2018 11:00:11

## 2018-01-10 NOTE — Progress Notes (Signed)
ALECXIS, BALTZELL ([id number]) Visit Report for 01/05/2018 Chief Complaint Document Details Patient Name: [name]. Date of Service: 01/05/2018 10:45 AM Medical Record Number: [id number] Patient Account Number: [id number] Date of Birth/Sex: [birth year] (72 y.o. M) Treating RN: [name] Primary Care Provider: Etheleen [name] Other Clinician: Referring Provider: Etheleen [name] Treating Provider/Extender: [name], [name] Weeks in Treatment: 9 Information Obtained from: Patient Chief Complaint Right lateral ankle ulcer Electronic Signature(s) Signed: 01/08/2018 8:01:46 PM By: Lenda Kelp PA-C Entered By: Lenda Kelp on 01/05/2018 11:13:39 [name], [name] ([id number]) -------------------------------------------------------------------------------- HPI Details Patient Name: [name]. Date of Service: 01/05/2018 10:45 AM Medical Record Number: [id number] Patient Account Number: [id number] Date of Birth/Sex: 06-24-1945 (72 y.o. M) Treating RN: [name] Primary Care Provider: Etheleen [name] Other Clinician: Referring Provider: Etheleen [name] Treating Provider/Extender: [name], [name] Weeks in Treatment: 9 History of Present Illness HPI Description: As noted above the patient had a injury due to working in the yard and this rapidly progressed to a ulcerated area with an infection. In October 2014 he did have some vascular studies done but never saw a Careers adviser and never had any surgery for varicose veins. His past medical history is significant for hypertension, obstructive sleep apnea, morbid obesity, stasis dermatitis of both lower extremities and his past surgical history is suggestive of hip surgery on the right, gastric bypass surgery, cholecystectomy, hip replacement. He has recently been put on Levaquin 500 milligrams daily, Bactrim DS 1 twice a day and Bactroban 2% topical ointment locally. No recent labs or vascular or imaging studies done. 07/11/2014  a venous duplex study was done on 07/05/2014 -- it showed no evidence of deep vein thrombosis or superficial thrombosis bilaterally. 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 The patient has an appointment to see the vascular surgeons on June 20. 07/18/2014 his appointment with the vascular surgeons is rescheduled for this afternoon. 07/25/2014 -- he saw the vascular surgeon and is going to have a endovenous procedure done on July 29. 08/01/2014 -- he was diagnosed with a UTI and is on ciprofloxacin for 10 days. readmission: 11/27/15 On evaluation today patient is having discomfort in the right ankle region following an open wound from having dropped a cardboard box striking his ankle. He tells me that this has not healed despite many of the measures that he learned from previous injuries when he was seen at our wound center. Therefore he didn't come in for reevaluation today. He has not noted any significant purulent discharge at this point in time. 12/04/15 patient appears to be doing somewhat better at this point in time today in regard to his ankle wound. it is measuring smaller and looking cleaner. 12/11/15; patient wound continues to look improve this is on the right lateral ankle. Debrided of surface slough and nonviable subcutaneous tissue. Using silver alginate 12-18-15 [name] with his wife for follow-up on right lower extremity venous ulcer. He denies any changes or complications over the past week. He is tolerating compression therapy to his right lower extremity and has a properly fitting compression hose to the left lower extremity. 12/25/15; patient with a wound on his right lateral lower extremity in the setting of severe venous insufficiency [originally trauma against the box]. His edema is well controlled he is using Prisma. 01/01/16; continued improvement using Prisma. He has severe  venous insufficiency however the wound was originally trauma against a box. 01/08/16; his wound  is totally epithelialized. We will give him measurements for graded pressure stockings which I think he is going to get at elastic therapy in [facility] Readmission: 10/30/17 patient presents today for reevaluation in our clinic although it has been since 2017 that we've last seen him. He has a ulcer on the right lateral ankle which he tells me began roughly 2 months ago. He states that he had one similar on the left ankle although this completely healed without any complication. However the one on the right as continue to give him a lot of trouble and is not healing appropriately. He really was not sure what the best thing to put on the area would be and therefore he's just been putting advantage to cover it. Another treatment has been utilized at this time. Fortunately there is not appear to be any evidence of significant infection necessarily although there is some evidence of abnormal discoloration in regard to [facility], Donyae R. ([id number]) the base of the wound that does not look as healthy as I was on the lives life. He does have erythema surrounding but I'm not sure if this is just due to chronic venous stasis/lymphedema or if it truly is anything infection wise going on at this point. Nonetheless in general the patient seems to be doing fairly well he's not having too much pain although he does have some discomfort. No fevers, chills, nausea, or vomiting noted at this time. 11/13/17 on evaluation today patient actually appears to be doing much better in regard to his wound currently. There does seem to be evidence of good improvement there still a lot of maceration and some necrotic tissue on the surface of the wound but this was cleaned away very well today. Overall I'm very pleased with how things seem to be progressing in general. We did get the results of his culture back which  showed that he did have evidence of Staphylococcus aureus as well as group B strep. It does appear that the Bactrim was sensitive for both. The wound again does look better. 11/20/17 on evaluation today patient's right lateral ankle ulcer actually appears to be doing excellent at this time. He has been tolerating the dressing changes without complication. With that being said overall I do feel like that he has made great progress in the few weeks we've been seeing him at this point. No fevers, chills, nausea, or vomiting noted at this time. 11/30/17 on evaluation today patient appears to be doing extremely well at this point in regard to his right lateral ankle ulcer. He has been tolerating the dressing changes without complication. In general I've been very pleased with the overall progress. His wound overview chart shows that he is making steady progress towards closure. 12/07/17 on evaluation today patient actually appears to be doing well in regard to his ulcer. He does have somewhat of an irregular heart rate noted at this point this was confirmed by manual checks as well. However it was not as low as the initial reading by the machine which was around 48 he was closer to around 58-62. Nonetheless it does sound as if you may have a little bit of an irregularity possibly a field although it was somewhat slow and difficult to gauge the exact rhythm. Otherwise his wound itself seems to be showing signs of great improvement with good epithelialization. He's tolerating the Iodoflex well. 12/14/17 on evaluation today patient continues to show signs of great improvement in my pinion. Overall I'm very pleased  with the progress that is made. With that being said though he has shown improvement he tells me that for the first couple of days after we put his wrap on that he has severe pain for pretty much that entire amount of time. Nonetheless at this time I'm not seeing any evidence of infection which is  good news. I think this may actually be coming from the actual dressing itself. That is the Iodoflex. 12/22/17 on evaluation today patient actually appears to be doing much better in regard to his ulcer. He's been shown signs of improvement which is great news. Fortunately there does not appear to be any evidence of infection at this time. Overall I feel like he has shown excellent progress even since just last week. 12/29/17 on evaluation today patient actually appears to be doing excellent although he does have some of the dressing material stuck to the wound bed. Fortunately there does not appear to be any signs of infection which is great news. Overall I'm very pleased with the progression of his wound. 01/05/18 on evaluation today patient actually appears to be doing rather well in regard to his ankle ulcer. He has been tolerating the dressing changes without complication. Overall very pleased. He seems to be very close to healing. Electronic Signature(s) Signed: 01/08/2018 8:01:46 PM By: Lenda Kelp PA-C Entered By: Lenda Kelp on 01/05/2018 11:33:05 [name], [name] ([id number]) -------------------------------------------------------------------------------- Physical Exam Details Patient Name: [name]. Date of Service: 01/05/2018 10:45 AM Medical Record Number: [id number] Patient Account Number: [id number] Date of Birth/Sex: [birth year] (72 y.o. M) Treating RN: [name] Primary Care Provider: Etheleen [name] Other Clinician: Referring Provider: Etheleen [name] Treating Provider/Extender: [name], [name] Weeks in Treatment: 9 Constitutional Well-nourished and well-hydrated in no acute distress. Respiratory normal breathing without difficulty. clear to auscultation bilaterally. Cardiovascular regular rate and rhythm with normal S1, S2. trace pitting edema of the bilateral lower extremities. Psychiatric this patient is able to make decisions and demonstrates good  insight into disease process. Alert and Oriented x 3. pleasant and cooperative. Notes Upon evaluation and inspection today the patient actually appears to be doing rather well in regard to the swelling of his right lower extremity as well as the ankle ulcer. I see a lot of new skin growth and I think this is very close to complete closure. Overall very pleased in this regard. Electronic Signature(s) Signed: 01/08/2018 8:01:46 PM By: Lenda Kelp PA-C Entered By: Lenda Kelp on 01/05/2018 11:33:33 [name], [name] ([id number]) -------------------------------------------------------------------------------- Physician Orders Details Patient Name: [name]. Date of Service: 01/05/2018 10:45 AM Medical Record Number: [id number] Patient Account Number: [id number] Date of Birth/Sex: Dec 13, 1945 (72 y.o. M) Treating RN: [name] Primary Care Provider: Etheleen [name] Other Clinician: Referring Provider: Etheleen [name] Treating Provider/Extender: [name], [name] Weeks in Treatment: 9 Verbal / Phone Orders: No Diagnosis Coding ICD-10 Coding Code Description I87.2 Venous insufficiency (chronic) (peripheral) I89.0 Lymphedema, not elsewhere classified L97.312 Non-pressure chronic ulcer of right ankle with fat layer exposed E66.01 Morbid (severe) obesity due to excess calories Wound Cleansing Wound #6 Right,Lateral Malleolus o Clean wound with Normal Saline. o May Shower, gently pat wound dry prior to applying new dressing. Anesthetic (add to Medication List) Wound #6 Right,Lateral Malleolus o Topical Lidocaine 4% cream applied to wound bed prior to debridement (In Clinic Only). Primary Wound Dressing Wound #6 Right,Lateral Malleolus o Foam Dressing Change Frequency Wound #6 Right,Lateral Malleolus o Change dressing every week Follow-up Appointments Wound #6 Right,Lateral  Malleolus o Return Appointment in 1 week. o Nurse Visit as needed - Mr Nafziger may  call for wrap change if needed Edema Control Wound #6 Right,Lateral Malleolus o 3 Layer Compression System - Right Lower Extremity - unna paste to secure Additional Orders / Instructions Wound #6 Right,Lateral Malleolus o Increase protein intake. o Activity as tolerated Electronic Signature(s) KAIO, KUHLMAN ([id number]) Signed: 01/05/2018 5:28:58 PM By: [name] Signed: 01/08/2018 8:01:46 PM By: Lenda Kelp PA-C Entered By: [name] on 01/05/2018 11:18:15 [name], [name] ([id number]) -------------------------------------------------------------------------------- Problem List Details Patient Name: MADYX, DELFIN R. Date of Service: 01/05/2018 10:45 AM Medical Record Number: [id number] Patient Account Number: [id number] Date of Birth/Sex: [birth year] (72 y.o. M) Treating RN: [name] Primary Care Provider: Etheleen [name] Other Clinician: Referring Provider: Etheleen [name] Treating Provider/Extender: [name], [name] Weeks in Treatment: 9 Active Problems ICD-10 Evaluated Encounter Code Description Active Date Today Diagnosis I87.2 Venous insufficiency (chronic) (peripheral) 10/30/2017 No Yes I89.0 Lymphedema, not elsewhere classified 10/30/2017 No Yes L97.312 Non-pressure chronic ulcer of right ankle with fat layer 10/30/2017 No Yes exposed E66.01 Morbid (severe) obesity due to excess calories 10/30/2017 No Yes Inactive Problems Resolved Problems Electronic Signature(s) Signed: 01/08/2018 8:01:46 PM By: Lenda Kelp PA-C Entered By: Lenda Kelp on 01/05/2018 11:13:33 [name], [name] ([id number]) -------------------------------------------------------------------------------- Progress Note Details Patient Name: [name]. Date of Service: 01/05/2018 10:45 AM Medical Record Number: [id number] Patient Account Number: [id number] Date of Birth/Sex: 06-17-1945 (72 y.o. M) Treating RN: [name] Primary Care Provider: Etheleen [name] Other  Clinician: Referring Provider: Etheleen [name] Treating Provider/Extender: [name], [name] Weeks in Treatment: 9 Subjective Chief Complaint Information obtained from Patient Right lateral ankle ulcer History of Present Illness (HPI) As noted above the patient had a injury due to working in the yard and this rapidly progressed to a ulcerated area with an infection. In October 2014 he did have some vascular studies done but never saw a Careers adviser and never had any surgery for varicose veins. His past medical history is significant for hypertension, obstructive sleep apnea, morbid obesity, stasis dermatitis of both lower extremities and his past surgical history is suggestive of hip surgery on the right, gastric bypass surgery, cholecystectomy, hip replacement. He has recently been put on Levaquin 500 milligrams daily, Bactrim DS 1 twice a day and Bactroban 2% topical ointment locally. No recent labs or vascular or imaging studies done. 07/11/2014 a venous duplex study was done on 07/05/2014 -- it showed no evidence of deep vein thrombosis or superficial thrombosis bilaterally. 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 The patient has an appointment to see the vascular surgeons on June 20. 07/18/2014 his appointment with the vascular surgeons is rescheduled for this afternoon. 07/25/2014 -- he saw the vascular surgeon and is going to have a endovenous procedure done on July 29. 08/01/2014 -- he was diagnosed with a UTI and is on ciprofloxacin for 10 days. readmission: 11/27/15 On evaluation today patient is having discomfort in the right ankle region following an open wound from having dropped a cardboard box striking his ankle. He tells me that this has not healed despite many of the measures that he learned from previous injuries when he was seen at our wound center. Therefore he didn't come in for reevaluation today.  He has not noted any significant purulent discharge at this point in time. 12/04/15 patient appears to be doing somewhat better at this point in time  today in regard to his ankle wound. it is measuring smaller and looking cleaner. 12/11/15; patient wound continues to look improve this is on the right lateral ankle. Debrided of surface slough and nonviable subcutaneous tissue. Using silver alginate 12-18-15 Mr. Eidson presents today with his wife for follow-up on right lower extremity venous ulcer. He denies any changes or complications over the past week. He is tolerating compression therapy to his right lower extremity and has a properly fitting compression hose to the left lower extremity. 12/25/15; patient with a wound on his right lateral lower extremity in the setting of severe venous insufficiency [originally trauma against the box]. His edema is well controlled he is using Prisma. 01/01/16; continued improvement using Prisma. He has severe venous insufficiency however the wound was originally trauma against a box. 01/08/16; his wound is totally epithelialized. We will give him measurements for graded pressure stockings which I think he is going to get at elastic therapy in [facility] Readmission: [name], [name] ([id number]) 10/30/17 patient presents today for reevaluation in our clinic although it has been since 2017 that we've last seen him. He has a ulcer on the right lateral ankle which he tells me began roughly 2 months ago. He states that he had one similar on the left ankle although this completely healed without any complication. However the one on the right as continue to give him a lot of trouble and is not healing appropriately. He really was not sure what the best thing to put on the area would be and therefore he's just been putting advantage to cover it. Another treatment has been utilized at this time. Fortunately there is not appear to be any evidence of  significant infection necessarily although there is some evidence of abnormal discoloration in regard to the base of the wound that does not look as healthy as I was on the lives life. He does have erythema surrounding but I'm not sure if this is just due to chronic venous stasis/lymphedema or if it truly is anything infection wise going on at this point. Nonetheless in general the patient seems to be doing fairly well he's not having too much pain although he does have some discomfort. No fevers, chills, nausea, or vomiting noted at this time. 11/13/17 on evaluation today patient actually appears to be doing much better in regard to his wound currently. There does seem to be evidence of good improvement there still a lot of maceration and some necrotic tissue on the surface of the wound but this was cleaned away very well today. Overall I'm very pleased with how things seem to be progressing in general. We did get the results of his culture back which showed that he did have evidence of Staphylococcus aureus as well as group B strep. It does appear that the Bactrim was sensitive for both. The wound again does look better. 11/20/17 on evaluation today patient's right lateral ankle ulcer actually appears to be doing excellent at this time. He has been tolerating the dressing changes without complication. With that being said overall I do feel like that he has made great progress in the few weeks we've been seeing him at this point. No fevers, chills, nausea, or vomiting noted at this time. 11/30/17 on evaluation today patient appears to be doing extremely well at this point in regard to his right lateral ankle ulcer. He has been tolerating the dressing changes without complication. In general I've been very pleased with the overall progress. His  wound overview chart shows that he is making steady progress towards closure. 12/07/17 on evaluation today patient actually appears to be doing well in regard  to his ulcer. He does have somewhat of an irregular heart rate noted at this point this was confirmed by manual checks as well. However it was not as low as the initial reading by the machine which was around 48 he was closer to around 58-62. Nonetheless it does sound as if you may have a little bit of an irregularity possibly a field although it was somewhat slow and difficult to gauge the exact rhythm. Otherwise his wound itself seems to be showing signs of great improvement with good epithelialization. He's tolerating the Iodoflex well. 12/14/17 on evaluation today patient continues to show signs of great improvement in my pinion. Overall I'm very pleased with the progress that is made. With that being said though he has shown improvement he tells me that for the first couple of days after we put his wrap on that he has severe pain for pretty much that entire amount of time. Nonetheless at this time I'm not seeing any evidence of infection which is good news. I think this may actually be coming from the actual dressing itself. That is the Iodoflex. 12/22/17 on evaluation today patient actually appears to be doing much better in regard to his ulcer. He's been shown signs of improvement which is great news. Fortunately there does not appear to be any evidence of infection at this time. Overall I feel like he has shown excellent progress even since just last week. 12/29/17 on evaluation today patient actually appears to be doing excellent although he does have some of the dressing material stuck to the wound bed. Fortunately there does not appear to be any signs of infection which is great news. Overall I'm very pleased with the progression of his wound. 01/05/18 on evaluation today patient actually appears to be doing rather well in regard to his ankle ulcer. He has been tolerating the dressing changes without complication. Overall very pleased. He seems to be very close to healing. Patient  History Information obtained from Patient. Family History Cancer - Mother, Diabetes - Mother, Heart Disease - Maternal Grandparents, No family history of Hereditary Spherocytosis, Hypertension, Kidney Disease, Lung Disease, Seizures, Stroke, Thyroid Problems, Tuberculosis. Social History Former smoker, Marital Status - Married, Alcohol Use - Rarely, Drug Use - No History, Caffeine Use - Moderate. [name] AlmaSHAFAR, Cosme R. ([id number]) Medical History Hospitalization/Surgery [place]. [facility] [place] - [facility] cell[facility]. 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 Respiratory The patient has no complaints or symptoms. Cardiovascular Complains or has symptoms of LE edema. Psychiatric The patient has no complaints or symptoms. Objective Constitutional Well-nourished and well-hydrated in no acute distress. Vitals Time Taken: 10:59 AM, Height: 69 in, Weight: 320 lbs, BMI: 47.3, Temperature: 97.9 F, Pulse: 68 bpm, Respiratory Rate: 16 breaths/min, Blood Pressure: 115/62 mmHg. Respiratory normal breathing without difficulty. clear to auscultation bilaterally. Cardiovascular regular rate and rhythm with normal S1, S2. trace pitting edema of the bilateral lower extremities. Psychiatric this patient is able to make decisions and demonstrates good insight into disease process. Alert and Oriented x 3. pleasant and cooperative. General Notes: Upon evaluation and inspection today the patient actually appears to be doing rather well in  regard to the swelling of his right lower extremity as well as the ankle ulcer. I see a lot of new skin growth and I think this is very close to complete closure. Overall very pleased in this  regard. Integumentary (Hair, Skin) Wound #6 status is Open. Original cause of wound was Gradually Appeared. The wound is located on the Right,Lateral Malleolus. The wound measures 0.8cm length x 0.3cm width x 0.2cm depth; 0.188cm^2 area and 0.038cm^3 volume. There is Fat Layer (Subcutaneous Tissue) Exposed exposed. There is no tunneling or undermining noted. There is a medium amount of serous drainage noted. The wound margin is flat and intact. There is large (67-100%) red, pink granulation within the wound bed. There is no necrotic tissue within the wound bed. The periwound skin appearance exhibited: Scarring, Maceration, Ecchymosis, Hemosiderin Staining. The periwound skin appearance did not exhibit: Callus, Crepitus, Excoriation, Induration, Rash, Dry/Scaly, Atrophie Blanche, Cyanosis, Mottled, Pallor, Rubor, Erythema. Periwound temperature was Lamb, Nathanal R. ([id number]) noted as No Abnormality. The periwound has tenderness on palpation. Assessment Active Problems ICD-10 Venous insufficiency (chronic) (peripheral) Lymphedema, not elsewhere classified Non-pressure chronic ulcer of right ankle with fat layer exposed Morbid (severe) obesity due to excess calories Procedures Wound #6 Pre-procedure diagnosis of Wound #6 is a Lymphedema located on the Right,Lateral Malleolus . There was a Three Layer Compression Therapy Procedure with a pre-treatment ABI of 1.3 by [name] Sites, RN. Post procedure Diagnosis Wound #6: Same as Pre-Procedure Plan Wound Cleansing: Wound #6 Right,Lateral Malleolus: Clean wound with Normal Saline. May Shower, gently pat wound dry prior to applying new dressing. Anesthetic (add to Medication List): Wound #6 Right,Lateral Malleolus: Topical Lidocaine 4% cream applied to wound bed prior to debridement (In Clinic Only). Primary Wound Dressing: Wound #6 Right,Lateral Malleolus: Foam Dressing Change Frequency: Wound #6 Right,Lateral Malleolus: Change dressing  every week Follow-up Appointments: Wound #6 Right,Lateral Malleolus: Return Appointment in 1 week. Nurse Visit as needed - Mr [name] may call for wrap change if needed Edema Control: Wound #6 Right,Lateral Malleolus: 3 Layer Compression System - Right Lower Extremity - unna paste to secure Additional Orders / Instructions: Wound #6 Right,Lateral Malleolus: Increase protein intake. Activity as tolerated [name], [name] ([id number]) I'm in a recommend at this point that we go ahead and continue with the above wound care measures for the next week. Patient is in agreement with plan. Anything changes worsens the meantime he will contact the office and let me know. Please see above for specific wound care orders. We will see patient for re-evaluation in 1 week(s) here in the clinic. If anything worsens or changes patient will contact our office for additional recommendations. Electronic Signature(s) Signed: 01/08/2018 8:01:46 PM By: Lenda Kelp PA-C Entered By: Lenda Kelp on 01/05/2018 11:34:08 Delossantos, [name] ([id number]) -------------------------------------------------------------------------------- ROS/PFSH Details Patient Name: [name]. Date of Service: 01/05/2018 10:45 AM Medical Record Number: [id number] Patient Account Number: [id number] Date of Birth/Sex: Jul 31, 1945 (72 y.o. M) Treating RN: [name] Primary Care Provider: Etheleen [name] Other Clinician: Referring Provider: Etheleen [name] Treating Provider/Extender: [name], [name] Weeks in Treatment: 9 Information Obtained From Patient Wound History Do you currently have one or more open woundso Yes How many open wounds do you currently haveo 1 Approximately how long have you had your woundso 1 month + How have you been treating your wound(s) until nowo bandaid Has your wound(s) ever healed and then re-openedo No Have you had any lab work done in the past montho No Have you tested  positive for an  antibiotic resistant organism (MRSA, VRE)o No Have you tested positive for osteomyelitis (bone infection)o No Have you had any tests for circulation on your legso No Constitutional Symptoms (General Health) Complaints and Symptoms: Negative for: Fever; Chills Cardiovascular Complaints and Symptoms: Positive for: LE edema Medical History: Positive for: Hypertension Negative for: Angina; Arrhythmia; Congestive Heart Failure; Coronary Artery Disease; Deep Vein Thrombosis; Hypotension; Myocardial Infarction; Peripheral Arterial Disease; Peripheral Venous Disease; Phlebitis; Vasculitis Eyes Medical History: Negative for: Cataracts; Glaucoma; Optic Neuritis Ear/Nose/Mouth/Throat Medical History: Negative for: Chronic sinus problems/congestion; Middle ear problems Hematologic/Lymphatic Medical History: Positive for: Anemia - iron pills Negative for: Hemophilia; Human Immunodeficiency Virus; Lymphedema; Sickle Cell Disease Respiratory Complaints and Symptoms: No Complaints or Symptoms Schewe, Davidjames R. ([id number]) Medical History: Positive for: Sleep Apnea - C-pap and oxygen 1.5L at night Negative for: Aspiration; Asthma; Chronic Obstructive Pulmonary Disease (COPD); Pneumothorax; Tuberculosis Gastrointestinal Medical History: Negative for: Cirrhosis ; Colitis; Crohnos; Hepatitis A; Hepatitis B; Hepatitis C Endocrine Medical History: Negative for: Type I Diabetes; Type II Diabetes Genitourinary Medical History: Negative for: End Stage Renal Disease Immunological Medical History: Negative for: Lupus Erythematosus; Raynaudos; Scleroderma Integumentary (Skin) Medical History: Negative for: History of Burn; History of pressure wounds Musculoskeletal Medical History: Positive for: Osteoarthritis - left hip Negative for: Gout; Rheumatoid Arthritis; Osteomyelitis Past Medical History Notes: has artificial right hip and right shoulder Neurologic Medical History: Negative for:  Dementia; Neuropathy; Quadriplegia; Paraplegia; Seizure Disorder Oncologic Medical History: Negative for: Received Chemotherapy; Received Radiation Psychiatric Complaints and Symptoms: No Complaints or Symptoms Medical History: Negative for: Anorexia/bulimia; Confinement Anxiety Immunizations Pneumococcal Vaccine: Received Pneumococcal Vaccination: No Tetanus VaccineAVIR, DERUITER ([id number]) Last tetanus shot: 06/28/2011 Implantable Devices Hospitalization / Surgery History Name of Hospital Purpose of Hospitalization/Surgery Date [place] cellulitis 06/28/2003 [facility] [place] cell[facility]itis 07/27/2005 [facility] hip replacement 07/28/2003 [facility] cellulitis 06/30/2014 Family and Social History Cancer: Yes - Mother; Diabetes: Yes - Mother; Heart Disease: Yes - Maternal Grandparents; Hereditary Spherocytosis: No; Hypertension: No; Kidney Disease: No; Lung Disease: No; Seizures: No; Stroke: No; Thyroid Problems: No; Tuberculosis: No; Former smoker; Marital Status - Married; Alcohol Use: Rarely; Drug Use: No History; Caffeine Use: Moderate; Financial Concerns: No; Food, Clothing or Shelter Needs: No; Support System Lacking: No; Transportation Concerns: No; Advanced Directives: Yes (Not Provided); Patient does not want information on Advanced Directives; Living Will: Yes (Not Provided); Medical Power of Attorney: Yes (Not Provided) Physician Affirmation I have reviewed and agree with the above information. Electronic Signature(s) Signed: 01/05/2018 5:28:58 PM By: [name] Signed: 01/08/2018 8:01:46 PM By: Lenda Kelp PA-C Entered By: Lenda Kelp on 01/05/2018 11:33:20 Buttery, [name] ([id number]) -------------------------------------------------------------------------------- SuperBill Details Patient Name: [name]. Date of Service: 01/05/2018 Medical Record Number: [id number] Patient Account Number: [id number] Date of Birth/Sex: 11/25/1945 (72 y.o. M) Treating RN: [name] Primary Care Provider: Etheleen [name] Other Clinician: Referring Provider: Etheleen [name] Treating Provider/Extender: [name], [name] Weeks in Treatment: 9 Diagnosis Coding ICD-10 Codes Code Description I87.2 Venous insufficiency (chronic) (peripheral) I89.0 Lymphedema, not elsewhere classified L97.312 Non-pressure chronic ulcer of right ankle with fat layer exposed E66.01 Morbid (severe) obesity due to excess calories Facility Procedures CPT4 Code Description: 62952841 (Facility Use Only) [phone] - APPLY MULTLAY COMPRS LWR RT LEG Modifier: Quantity: 1 Physician Procedures CPT4 Code: 2725366 Description: 99214 - WC PHYS LEVEL 4 - EST PT ICD-10 Diagnosis Description I87.2 Venous insufficiency (chronic) (peripheral) I89.0 Lymphedema, not elsewhere classified L97.312 Non-pressure chronic ulcer of right ankle with fat layer  e E66.01 Morbid  (severe) obesity due to excess calories Modifier: xposed Quantity: 1 Electronic Signature(s) Signed: 01/08/2018 8:01:46 PM By: Lenda Kelp PA-C Entered By: Lenda Kelp on 01/05/2018 11:34:35

## 2018-01-12 DIAGNOSIS — M793 Panniculitis, unspecified: Secondary | ICD-10-CM | POA: Insufficient documentation

## 2018-01-14 ENCOUNTER — Encounter: Payer: Medicare Other | Admitting: Physician Assistant

## 2018-01-14 DIAGNOSIS — E11622 Type 2 diabetes mellitus with other skin ulcer: Secondary | ICD-10-CM | POA: Diagnosis not present

## 2018-01-17 NOTE — Progress Notes (Signed)
[name], [name] ([id number]) Visit Report for 01/14/2018 Arrival Information Details Patient Name: [name], [name]. Date of Service: 01/14/2018 10:45 AM Medical Record Number: [id number] Patient Account Number: [id number] Date of Birth/Sex: Jul 30, 1945 (72 y.o. M) Treating RN: Curtis Sites Primary Care Frederick Klinger: Etheleen Nicks Other Clinician: Referring Joshaua Epple: Etheleen Nicks Treating Sherronda Sweigert/Extender: Linwood Dibbles, HOYT Weeks in Treatment: 10 Visit Information History Since Last Visit Added or deleted any medications: No Patient Arrived: Crutches Any new allergies or adverse reactions: No Arrival Time: 10:56 Had a fall or experienced change in No Accompanied By: wife activities of daily living that may affect Transfer Assistance: None risk of falls: Patient Identification Verified: Yes Signs or symptoms of abuse/neglect since last visito No Secondary Verification Process Yes Hospitalized since last visit: No Completed: Implantable device outside of the clinic excluding No Patient Has Alerts: Yes cellular tissue based products placed in the center Patient Alerts: Hgb a1c 5.3 (4-27- since last visit: 19) Has Dressing in Place as Prescribed: Yes Pain Present Now: No Electronic Signature(s) Signed: 01/14/2018 3:46:06 PM By: Dayton Martes RCP, RRT, CHT Entered By: Dayton Martes on 01/14/2018 10:57:13 Ealey, Jorje Guild ([id number]) -------------------------------------------------------------------------------- Clinic Level of Care Assessment Details Patient Name: [name]. Date of Service: 01/14/2018 10:45 AM Medical Record Number: [id number] Patient Account Number: [id number] Date of Birth/Sex: April 29, 1945 (72 y.o. M) Treating RN: Curtis Sites Primary Care Samael Blades: Etheleen Nicks Other Clinician: Referring Blessings Inglett: Etheleen Nicks Treating Konnor Jorden/Extender: Linwood Dibbles, HOYT Weeks in Treatment: 10 Clinic Level of Care Assessment  Items TOOL 4 Quantity Score []  - Use when only an EandM is performed on FOLLOW-UP visit 0 ASSESSMENTS - Nursing Assessment / Reassessment X - Reassessment of Co-morbidities (includes updates in patient status) 1 10 X- 1 5 Reassessment of Adherence to Treatment Plan ASSESSMENTS - Wound and Skin Assessment / Reassessment X - Simple Wound Assessment / Reassessment - one wound 1 5 []  - 0 Complex Wound Assessment / Reassessment - multiple wounds []  - 0 Dermatologic / Skin Assessment (not related to wound area) ASSESSMENTS - Focused Assessment X - Circumferential Edema Measurements - multi extremities 1 5 []  - 0 Nutritional Assessment / Counseling / Intervention X- 1 5 Lower Extremity Assessment (monofilament, tuning fork, pulses) []  - 0 Peripheral Arterial Disease Assessment (using hand held doppler) ASSESSMENTS - Ostomy and/or Continence Assessment and Care []  - Incontinence Assessment and Management 0 []  - 0 Ostomy Care Assessment and Management (repouching, etc.) PROCESS - Coordination of Care X - Simple Patient / Family Education for ongoing care 1 15 []  - 0 Complex (extensive) Patient / Family Education for ongoing care []  - 0 Staff obtains Chiropractor, Records, Test Results / Process Orders []  - 0 Staff telephones HHA, Nursing Homes / Clarify orders / etc []  - 0 Routine Transfer to another Facility (non-emergent condition) []  - 0 Routine Hospital Admission (non-emergent condition) []  - 0 New Admissions / Manufacturing engineer / Ordering NPWT, Apligraf, etc. []  - 0 Emergency Hospital Admission (emergent condition) X- 1 10 Simple Discharge Coordination Oltmann, Encarnacion R. ([id number]) []  - 0 Complex (extensive) Discharge Coordination PROCESS - Special Needs []  - Pediatric / Minor Patient Management 0 []  - 0 Isolation Patient Management []  - 0 Hearing / Language / Visual special needs []  - 0 Assessment of Community assistance (transportation, D/C planning, etc.) []  -  0 Additional assistance / Altered mentation []  - 0 Support Surface(s) Assessment (bed, cushion, seat, etc.) INTERVENTIONS - Wound Cleansing / Measurement X - Simple Wound Cleansing - one  wound 1 5 []  - 0 Complex Wound Cleansing - multiple wounds X- 1 5 Wound Imaging (photographs - any number of wounds) []  - 0 Wound Tracing (instead of photographs) X- 1 5 Simple Wound Measurement - one wound []  - 0 Complex Wound Measurement - multiple wounds INTERVENTIONS - Wound Dressings []  - Small Wound Dressing one or multiple wounds 0 []  - 0 Medium Wound Dressing one or multiple wounds []  - 0 Large Wound Dressing one or multiple wounds []  - 0 Application of Medications - topical []  - 0 Application of Medications - injection INTERVENTIONS - Miscellaneous []  - External ear exam 0 []  - 0 Specimen Collection (cultures, biopsies, blood, body fluids, etc.) []  - 0 Specimen(s) / Culture(s) sent or taken to Lab for analysis []  - 0 Patient Transfer (multiple staff / Nurse, adultHoyer Lift / Similar devices) []  - 0 Simple Staple / Suture removal (25 or less) []  - 0 Complex Staple / Suture removal (26 or more) []  - 0 Hypo / Hyperglycemic Management (close monitor of Blood Glucose) []  - 0 Ankle / Brachial Index (ABI) - do not check if billed separately X- 1 5 Vital Signs Bou, Art R. ([id number]) Has the patient been seen at the hospital within the last three years: Yes Total Score: 75 Level Of Care: New/Established - Level 2 Electronic Signature(s) Signed: 01/15/2018 5:12:41 PM By: Curtis Sitesorthy, Joanna Entered By: Curtis Sitesorthy, Joanna on 01/14/2018 11:34:47 Glomb, [name] ([id number]) -------------------------------------------------------------------------------- Encounter Discharge Information Details Patient Name: [name] AlmaSHAFAR, [name] Date of Service: 01/14/2018 10:45 AM Medical Record Number: [id number] Patient Account Number: [id number] Date of Birth/Sex: 10/08/1945 (72 y.o. M) Treating RN: Curtis Sitesorthy,  Joanna Primary Care Devora Tortorella: Etheleen NicksMACDONALD, KERI Other Clinician: Referring Lalani Winkles: Etheleen NicksMACDONALD, KERI Treating Khaliel Morey/Extender: Linwood DibblesSTONE III, HOYT Weeks in Treatment: 10 Encounter Discharge Information Items Discharge Condition: Stable Ambulatory Status: Crutches Discharge Destination: Home Transportation: Private Auto Accompanied By: spouse Schedule Follow-up Appointment: No Clinical Summary of Care: Electronic Signature(s) Signed: 01/15/2018 5:12:41 PM By: Curtis Sitesorthy, Joanna Entered By: Curtis Sitesorthy, Joanna on 01/14/2018 11:35:26 Rud, [name] ([id number]) -------------------------------------------------------------------------------- Lower Extremity Assessment Details Patient Name: [name] AlmaSHAFAR, Amr R. Date of Service: 01/14/2018 10:45 AM Medical Record Number: [id number] Patient Account Number: [id number] Date of Birth/Sex: 09/29/1945 (72 y.o. M) Treating RN: Rema JasmineNg, Wendi Primary Care Freeland Pracht: Etheleen NicksMACDONALD, KERI Other Clinician: Referring Analeese Andreatta: Etheleen NicksMACDONALD, KERI Treating Renzo Vincelette/Extender: Linwood DibblesSTONE III, HOYT Weeks in Treatment: 10 Edema Assessment Assessed: [Left: No] [Right: No] [Left: Edema] [Right: :] Calf Left: Right: Point of Measurement: 35 cm From Medial Instep cm 40.5 cm Ankle Left: Right: Point of Measurement: 10 cm From Medial Instep cm 23 cm Vascular Assessment Claudication: Claudication Assessment [Right:None] Pulses: Dorsalis Pedis Palpable: [Right:Yes] Posterior Tibial Extremity colors, hair growth, and conditions: Extremity Color: [Right:Hyperpigmented] Hair Growth on Extremity: [Right:No] Temperature of Extremity: [Right:Warm] Capillary Refill: [Right:< 3 seconds] Toe Nail Assessment Left: Right: Thick: Yes Discolored: Yes Deformed: Yes Improper Length and Hygiene: No Electronic Signature(s) Signed: 01/14/2018 5:12:49 PM By: Rema JasmineNg, Wendi Entered By: Rema JasmineNg, Wendi on 01/14/2018 11:21:35 Pavao, [name]  ([id number]) -------------------------------------------------------------------------------- Multi-Disciplinary Care Plan Details Patient Name: [name] AlmaSHAFAR, Raymie R. Date of Service: 01/14/2018 10:45 AM Medical Record Number: [id number] Patient Account Number: [id number] Date of Birth/Sex: 10/27/1945 (72 y.o. M) Treating RN: Curtis Sitesorthy, Joanna Primary Care Liyana Suniga: Etheleen NicksMACDONALD, KERI Other Clinician: Referring Antjuan Rothe: Etheleen NicksMACDONALD, KERI Treating Heliodoro Domagalski/Extender: Linwood DibblesSTONE III, HOYT Weeks in Treatment: 10 Active Inactive Electronic Signature(s) Signed: 01/15/2018 5:12:41 PM By: Curtis Sitesorthy, Joanna Entered By: Curtis Sitesorthy, Joanna on 01/14/2018 11:33:46 Leaf, [name] ([id number]) -------------------------------------------------------------------------------- Pain Assessment Details Patient Name:  Pla, Deontray R. Date of Service: 01/14/2018 10:45 AM Medical Record Number: [id number] Patient Account Number: [id number] Date of Birth/Sex: 07/20/1945 (72 y.o. M) Treating RN: Curtis Sitesorthy, Joanna Primary Care Emilea Goga: Etheleen NicksMACDONALD, KERI Other Clinician: Referring Graceann Boileau: Etheleen NicksMACDONALD, KERI Treating Onedia Vargus/Extender: Linwood DibblesSTONE III, HOYT Weeks in Treatment: 10 Active Problems Location of Pain Severity and Description of Pain Patient Has Paino No Site Locations Pain Management and Medication Current Pain Management: Electronic Signature(s) Signed: 01/14/2018 3:46:06 PM By: Sallee ProvencalWallace, RCP,RRT,CHT, Sallie RCP, RRT, CHT Signed: 01/15/2018 5:12:41 PM By: Curtis Sitesorthy, Joanna Entered By: Dayton MartesWallace, RCP,RRT,CHT, Sallie on 01/14/2018 10:57:24 Spagnolo, [name] ([id number]) -------------------------------------------------------------------------------- Patient/Caregiver Education Details Patient Name: [name] AlmaSHAFAR, Abayomi R. Date of Service: 01/14/2018 10:45 AM Medical Record Number: [id number] Patient Account Number: [id number] Date of Birth/Gender: 12/26/1945 (72 y.o. M) Treating RN: Curtis Sitesorthy, Joanna Primary Care Physician: Etheleen NicksMACDONALD, KERI Other  Clinician: Referring Physician: Etheleen NicksMACDONALD, KERI Treating Physician/Extender: Skeet SimmerSTONE III, HOYT Weeks in Treatment: 10 Education Assessment Education Provided To: Patient and Caregiver Education Topics Provided Basic Hygiene: Handouts: Other: care of newly healed ulcer site Methods: Explain/Verbal Responses: State content correctly Electronic Signature(s) Signed: 01/15/2018 5:12:41 PM By: Curtis Sitesorthy, Joanna Entered By: Curtis Sitesorthy, Joanna on 01/14/2018 11:35:11 Dulac, [name] ([id number]) -------------------------------------------------------------------------------- Wound Assessment Details Patient Name: [name], Jaleen R. Date of Service: 01/14/2018 10:45 AM Medical Record Number: [id number] Patient Account Number: [id number] Date of Birth/Sex: 05/10/1945 (72 y.o. M) Treating RN: Curtis Sitesorthy, Joanna Primary Care Dat Derksen: Etheleen NicksMACDONALD, KERI Other Clinician: Referring Nelissa Bolduc: Etheleen NicksMACDONALD, KERI Treating Acsa Estey/Extender: Linwood DibblesSTONE III, HOYT Weeks in Treatment: 10 Wound Status Wound Number: 6 Primary Lymphedema Etiology: Wound Location: Right, Lateral Malleolus Wound Status: Healed - Epithelialized Wounding Event: Gradually Appeared Comorbid Anemia, Sleep Apnea, Hypertension, Date Acquired: 09/27/2017 History: Osteoarthritis Weeks Of Treatment: 10 Clustered Wound: No Wound Measurements Length: (cm) 0 % R Width: (cm) 0 % R Depth: (cm) 0 Epi Area: (cm) 0 Tu Volume: (cm) 0 Un eduction in Area: 100% eduction in Volume: 100% thelialization: Medium (34-66%) nneling: No dermining: No Wound Description Full Thickness Without Exposed Support Classification: Structures Wound Margin: Flat and Intact Exudate Medium Amount: Exudate Type: Serous Exudate Color: amber Foul Odor After Cleansing: No Slough/Fibrino No Wound Bed Granulation Amount: None Present (0%) Exposed Structure Necrotic Amount: Small (1-33%) Fascia Exposed: No Necrotic Quality: Eschar Fat Layer (Subcutaneous Tissue)  Exposed: Yes Tendon Exposed: No Muscle Exposed: No Joint Exposed: No Bone Exposed: No Periwound Skin Texture Texture Color No Abnormalities Noted: No No Abnormalities Noted: No Callus: No Atrophie Blanche: No Crepitus: No Cyanosis: No Excoriation: No Ecchymosis: Yes Induration: No Erythema: No Rash: No Hemosiderin Staining: Yes Scarring: Yes Mottled: No Pallor: No Moisture Rubor: No No Abnormalities Noted: No Dry / Scaly: No Temperature / Pain Elizardo, Steel R. ([id number]) Maceration: Yes Temperature: No Abnormality Tenderness on Palpation: Yes Wound Preparation Ulcer Cleansing: Rinsed/Irrigated with Saline Topical Anesthetic Applied: Other: lidocaine 4%, Electronic Signature(s) Signed: 01/15/2018 5:12:41 PM By: Curtis Sitesorthy, Joanna Entered By: Curtis Sitesorthy, Joanna on 01/14/2018 11:33:32 Andres, [name] ([id number]) -------------------------------------------------------------------------------- Vitals Details Patient Name: [name] AlmaSHAFAR, Canio R. Date of Service: 01/14/2018 10:45 AM Medical Record Number: [id number] Patient Account Number: [id number] Date of Birth/Sex: 10/11/1945 (72 y.o. M) Treating RN: Curtis Sitesorthy, Joanna Primary Care Sharlene Mccluskey: Etheleen NicksMACDONALD, KERI Other Clinician: Referring Phil Michels: Etheleen NicksMACDONALD, KERI Treating Donavyn Fecher/Extender: Linwood DibblesSTONE III, HOYT Weeks in Treatment: 10 Vital Signs Time Taken: 10:57 Temperature (F): 97.7 Height (in): 69 Pulse (bpm): 63 Weight (lbs): 320 Respiratory Rate (breaths/min): 16 Body Mass Index (BMI): 47.3 Blood Pressure (mmHg): 153/68 Reference Range: 80 - 120 mg / dl Electronic Signature(s) Signed:  01/14/2018 3:46:06 PM By: Dayton Martes RCP, RRT, CHT Entered By: Dayton Martes on 01/14/2018 11:01:11

## 2018-01-17 NOTE — Progress Notes (Signed)
[name] ([id number]) Visit Report for 01/14/2018 Chief Complaint Document Details Patient Name: [name], [name]. Date of Service: 01/14/2018 10:45 AM Medical Record Number: [id number] Patient Account Number: [id number] Date of Birth/Sex: 05/08/1945 (72 y.o. M) Treating RN: Curtis Sites Primary Care Provider: Etheleen Nicks Other Clinician: Referring Provider: Etheleen Nicks Treating Provider/Extender: Linwood Dibbles, HOYT Weeks in Treatment: 10 Information Obtained from: Patient Chief Complaint Right lateral ankle ulcer Electronic Signature(s) Signed: 01/14/2018 5:48:37 PM By: Lenda Kelp PA-C Entered By: Lenda Kelp on 01/14/2018 10:38:17 [name] ([id number]) -------------------------------------------------------------------------------- HPI Details Patient Name: [name]. Date of Service: 01/14/2018 10:45 AM Medical Record Number: [id number] Patient Account Number: [id number] Date of Birth/Sex: October 08, 1945 (72 y.o. M) Treating RN: Curtis Sites Primary Care Provider: Etheleen Nicks Other Clinician: Referring Provider: Etheleen Nicks Treating Provider/Extender: Linwood Dibbles, HOYT Weeks in Treatment: 10 History of Present Illness HPI Description: As noted above the patient had a injury due to working in the yard and this rapidly progressed to a ulcerated area with an infection. In October 2014 he did have some vascular studies done but never saw a Careers adviser and never had any surgery for varicose veins. His past medical history is significant for hypertension, obstructive sleep apnea, morbid obesity, stasis dermatitis of both lower extremities and his past surgical history is suggestive of hip surgery on the right, gastric bypass surgery, cholecystectomy, hip replacement. He has recently been put on Levaquin 500 milligrams daily, Bactrim DS 1 twice a day and Bactroban 2% topical ointment locally. No recent labs or vascular or imaging studies  done. 07/11/2014 a venous duplex study was done on 07/05/2014 -- it showed no evidence of deep vein thrombosis or superficial thrombosis bilaterally. 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 The patient has an appointment to see the vascular surgeons on June 20. 07/18/2014 his appointment with the vascular surgeons is rescheduled for this afternoon. 07/25/2014 -- he saw the vascular surgeon and is going to have a endovenous procedure done on July 29. 08/01/2014 -- he was diagnosed with a UTI and is on ciprofloxacin for 10 days. readmission: 11/27/15 On evaluation today patient is having discomfort in the right ankle region following an open wound from having dropped a cardboard box striking his ankle. He tells me that this has not healed despite many of the measures that he learned from previous injuries when he was seen at our wound center. Therefore he didn't come in for reevaluation today. He has not noted any significant purulent discharge at this point in time. 12/04/15 patient appears to be doing somewhat better at this point in time today in regard to his ankle wound. it is measuring smaller and looking cleaner. 12/11/15; patient wound continues to look improve this is on the right lateral ankle. Debrided of surface slough and nonviable subcutaneous tissue. Using silver alginate 12-18-15 Mr. [name] presents today with his wife for follow-up on right lower extremity venous ulcer. He denies any changes or complications over the past week. He is tolerating compression therapy to his right lower extremity and has a properly fitting compression hose to the left lower extremity. 12/25/15; patient with a wound on his right lateral lower extremity in the setting of severe venous insufficiency [originally trauma against the box]. His edema is well controlled he is using Prisma. 01/01/16; continued improvement using Prisma.  He has severe venous insufficiency however the wound was originally trauma against a box. 01/08/16; his wound  is totally epithelialized. We will give him measurements for graded pressure stockings which I think he is going to get at elastic therapy in [facility] Readmission: 10/30/17 patient presents today for reevaluation in our clinic although it has been since 2017 that we've last seen him. He has a ulcer on the right lateral ankle which he tells me began roughly 2 months ago. He states that he had one similar on the left ankle although this completely healed without any complication. However the one on the right as continue to give him a lot of trouble and is not healing appropriately. He really was not sure what the best thing to put on the area would be and therefore he's just been putting advantage to cover it. Another treatment has been utilized at this time. Fortunately there is not appear to be any evidence of significant infection necessarily although there is some evidence of abnormal discoloration in regard to [facility], [name] ([id number]) the base of the wound that does not look as healthy as I was on the lives life. He does have erythema surrounding but I'm not sure if this is just due to chronic venous stasis/lymphedema or if it truly is anything infection wise going on at this point. Nonetheless in general the patient seems to be doing fairly well he's not having too much pain although he does have some discomfort. No fevers, chills, nausea, or vomiting noted at this time. 11/13/17 on evaluation today patient actually appears to be doing much better in regard to his wound currently. There does seem to be evidence of good improvement there still a lot of maceration and some necrotic tissue on the surface of the wound but this was cleaned away very well today. Overall I'm very pleased with how things seem to be progressing in general. We did get the results of his culture  back which showed that he did have evidence of Staphylococcus aureus as well as group B strep. It does appear that the Bactrim was sensitive for both. The wound again does look better. 11/20/17 on evaluation today patient's right lateral ankle ulcer actually appears to be doing excellent at this time. He has been tolerating the dressing changes without complication. With that being said overall I do feel like that he has made great progress in the few weeks we've been seeing him at this point. No fevers, chills, nausea, or vomiting noted at this time. 11/30/17 on evaluation today patient appears to be doing extremely well at this point in regard to his right lateral ankle ulcer. He has been tolerating the dressing changes without complication. In general I've been very pleased with the overall progress. His wound overview chart shows that he is making steady progress towards closure. 12/07/17 on evaluation today patient actually appears to be doing well in regard to his ulcer. He does have somewhat of an irregular heart rate noted at this point this was confirmed by manual checks as well. However it was not as low as the initial reading by the machine which was around 48 he was closer to around 58-62. Nonetheless it does sound as if you may have a little bit of an irregularity possibly a field although it was somewhat slow and difficult to gauge the exact rhythm. Otherwise his wound itself seems to be showing signs of great improvement with good epithelialization. He's tolerating the Iodoflex well. 12/14/17 on evaluation today patient continues to show signs of great improvement in my pinion. Overall I'm very pleased  with the progress that is made. With that being said though he has shown improvement he tells me that for the first couple of days after we put his wrap on that he has severe pain for pretty much that entire amount of time. Nonetheless at this time I'm not seeing any evidence of infection  which is good news. I think this may actually be coming from the actual dressing itself. That is the Iodoflex. 12/22/17 on evaluation today patient actually appears to be doing much better in regard to his ulcer. He's been shown signs of improvement which is great news. Fortunately there does not appear to be any evidence of infection at this time. Overall I feel like he has shown excellent progress even since just last week. 12/29/17 on evaluation today patient actually appears to be doing excellent although he does have some of the dressing material stuck to the wound bed. Fortunately there does not appear to be any signs of infection which is great news. Overall I'm very pleased with the progression of his wound. 01/05/18 on evaluation today patient actually appears to be doing rather well in regard to his ankle ulcer. He has been tolerating the dressing changes without complication. Overall very pleased. He seems to be very close to healing. 01/14/18 on evaluation today patient actually appears to be doing excellent in regard to his right lateral lower extremity wound. He's been tolerating the dressing changes without complication. Fortunately he appears to be completely healed today. Electronic Signature(s) Signed: 01/14/2018 5:48:37 PM By: Lenda KelpStone III, Hoyt PA-C Entered By: Lenda KelpStone III, Hoyt on 01/14/2018 13:27:42 Sardo, [name] ([id number]) -------------------------------------------------------------------------------- Physical Exam Details Patient Name: [name], [name] Date of Service: 01/14/2018 10:45 AM Medical Record Number: [id number] Patient Account Number: [id number] Date of Birth/Sex: 10/05/1945 (72 y.o. M) Treating RN: Curtis Sitesorthy, Joanna Primary Care Provider: Etheleen NicksMACDONALD, KERI Other Clinician: Referring Provider: Etheleen NicksMACDONALD, KERI Treating Provider/Extender: STONE III, HOYT Weeks in Treatment: 10 Constitutional Well-nourished and well-hydrated in no acute  distress. Respiratory normal breathing without difficulty. Psychiatric this patient is able to make decisions and demonstrates good insight into disease process. Alert and Oriented x 3. pleasant and cooperative. Notes Currently patient shows complete epithelialization there was some dry skin noted over the area of the wound although this was cleaned off and there appears to be no opening underline. Overall I do believe he is doing very well at this time. He does have his FarrowWrap available today for us to put on him for the side. Electronic Signature(s) Signed: 01/14/2018 5:48:37 PM By: Lenda KelpStone III, Hoyt PA-C Entered By: Lenda KelpStone III, Hoyt on 01/14/2018 13:28:27 [name], Lige R. ([id number]) -------------------------------------------------------------------------------- Physician Orders Details Patient Name: [name], [name] Date of Service: 01/14/2018 10:45 AM Medical Record Number: [id number] Patient Account Number: [id number] Date of Birth/Sex: 05/09/1945 (72 y.o. M) Treating RN: Curtis Sitesorthy, Joanna Primary Care Provider: Etheleen NicksMACDONALD, KERI Other Clinician: Referring Provider: Etheleen NicksMACDONALD, KERI Treating Provider/Extender: Linwood DibblesSTONE III, HOYT Weeks in Treatment: 10 Verbal / Phone Orders: No Diagnosis Coding ICD-10 Coding Code Description I87.2 Venous insufficiency (chronic) (peripheral) I89.0 Lymphedema, not elsewhere classified L97.312 Non-pressure chronic ulcer of right ankle with fat layer exposed E66.01 Morbid (severe) obesity due to excess calories [facility] from [facility] - Please continue to wear your velcro compression wraps daily Electronic Signature(s) Signed: 01/14/2018 5:48:37 PM By: Lenda KelpStone III, Hoyt PA-C Signed: 01/15/2018 5:12:41 PM By: Curtis Sitesorthy, Joanna Entered By: Curtis Sitesorthy, Joanna on 01/14/2018 11:34:22 Girtman, [name] ([id number]) -------------------------------------------------------------------------------- Problem List Details Patient  Name: [name], [name]. Date of Service: 01/14/2018 10:45 AM Medical Record Number: [id number] Patient Account Number: [id number] Date of Birth/Sex: Jun 15, 1945 (72 y.o. M) Treating RN: Curtis Sites Primary Care Provider: Etheleen Nicks Other Clinician: Referring Provider: Etheleen Nicks Treating Provider/Extender: Linwood Dibbles, HOYT Weeks in Treatment: 10 Active Problems ICD-10 Evaluated Encounter Code Description Active Date Today Diagnosis I87.2 Venous insufficiency (chronic) (peripheral) 10/30/2017 No Yes I89.0 Lymphedema, not elsewhere classified 10/30/2017 No Yes L97.312 Non-pressure chronic ulcer of right ankle with fat layer 10/30/2017 No Yes exposed E66.01 Morbid (severe) obesity due to excess calories 10/30/2017 No Yes Inactive Problems Resolved Problems Electronic Signature(s) Signed: 01/14/2018 5:48:37 PM By: Lenda Kelp PA-C Entered By: Lenda Kelp on 01/14/2018 10:38:13 Elvin, [name] ([id number]) -------------------------------------------------------------------------------- Progress Note Details Patient Name: [name]. Date of Service: 01/14/2018 10:45 AM Medical Record Number: [id number] Patient Account Number: [id number] Date of Birth/Sex: 1945/07/22 (72 y.o. M) Treating RN: Curtis Sites Primary Care Provider: Etheleen Nicks Other Clinician: Referring Provider: Etheleen Nicks Treating Provider/Extender: Linwood Dibbles, HOYT Weeks in Treatment: 10 Subjective Chief Complaint Information obtained from Patient Right lateral ankle ulcer History of Present Illness (HPI) As noted above the patient had a injury due to working in the yard and this rapidly progressed to a ulcerated area with an infection. In October 2014 he did have some vascular studies done but never saw a Careers adviser and never had any surgery for varicose veins. His past medical history is significant for hypertension, obstructive sleep apnea, morbid obesity, stasis dermatitis of both  lower extremities and his past surgical history is suggestive of hip surgery on the right, gastric bypass surgery, cholecystectomy, hip replacement. He has recently been put on Levaquin 500 milligrams daily, Bactrim DS 1 twice a day and Bactroban 2% topical ointment locally. No recent labs or vascular or imaging studies done. 07/11/2014 a venous duplex study was done on 07/05/2014 -- it showed no evidence of deep vein thrombosis or superficial thrombosis bilaterally. 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 The patient has an appointment to see the vascular surgeons on June 20. 07/18/2014 his appointment with the vascular surgeons is rescheduled for this afternoon. 07/25/2014 -- he saw the vascular surgeon and is going to have a endovenous procedure done on July 29. 08/01/2014 -- he was diagnosed with a UTI and is on ciprofloxacin for 10 days. readmission: 11/27/15 On evaluation today patient is having discomfort in the right ankle region following an open wound from having dropped a cardboard box striking his ankle. He tells me that this has not healed despite many of the measures that he learned from previous injuries when he was seen at our wound center. Therefore he didn't come in for reevaluation today. He has not noted any significant purulent discharge at this point in time. 12/04/15 patient appears to be doing somewhat better at this point in time today in regard to his ankle wound. it is measuring smaller and looking cleaner. 12/11/15; patient wound continues to look improve this is on the right lateral ankle. Debrided of surface slough and nonviable subcutaneous tissue. Using silver alginate 12-18-15 Mr. Vetter presents today with his wife for follow-up on right lower extremity venous ulcer. He denies any changes or complications over the past week. He is tolerating compression therapy to his right lower  extremity and has a properly fitting compression hose to the left lower extremity. 12/25/15; patient with a wound on his right lateral lower  extremity in the setting of severe venous insufficiency [originally trauma against the box]. His edema is well controlled he is using Prisma. 01/01/16; continued improvement using Prisma. He has severe venous insufficiency however the wound was originally trauma against a box. 01/08/16; his wound is totally epithelialized. We will give him measurements for graded pressure stockings which I think he is going to get at elastic therapy in [facility] Readmission: [name], [name] ([id number]) 10/30/17 patient presents today for reevaluation in our clinic although it has been since 2017 that we've last seen him. He has a ulcer on the right lateral ankle which he tells me began roughly 2 months ago. He states that he had one similar on the left ankle although this completely healed without any complication. However the one on the right as continue to give him a lot of trouble and is not healing appropriately. He really was not sure what the best thing to put on the area would be and therefore he's just been putting advantage to cover it. Another treatment has been utilized at this time. Fortunately there is not appear to be any evidence of significant infection necessarily although there is some evidence of abnormal discoloration in regard to the base of the wound that does not look as healthy as I was on the lives life. He does have erythema surrounding but I'm not sure if this is just due to chronic venous stasis/lymphedema or if it truly is anything infection wise going on at this point. Nonetheless in general the patient seems to be doing fairly well he's not having too much pain although he does have some discomfort. No fevers, chills, nausea, or vomiting noted at this time. 11/13/17 on evaluation today patient actually appears to be doing much  better in regard to his wound currently. There does seem to be evidence of good improvement there still a lot of maceration and some necrotic tissue on the surface of the wound but this was cleaned away very well today. Overall I'm very pleased with how things seem to be progressing in general. We did get the results of his culture back which showed that he did have evidence of Staphylococcus aureus as well as group B strep. It does appear that the Bactrim was sensitive for both. The wound again does look better. 11/20/17 on evaluation today patient's right lateral ankle ulcer actually appears to be doing excellent at this time. He has been tolerating the dressing changes without complication. With that being said overall I do feel like that he has made great progress in the few weeks we've been seeing him at this point. No fevers, chills, nausea, or vomiting noted at this time. 11/30/17 on evaluation today patient appears to be doing extremely well at this point in regard to his right lateral ankle ulcer. He has been tolerating the dressing changes without complication. In general I've been very pleased with the overall progress. His wound overview chart shows that he is making steady progress towards closure. 12/07/17 on evaluation today patient actually appears to be doing well in regard to his ulcer. He does have somewhat of an irregular heart rate noted at this point this was confirmed by manual checks as well. However it was not as low as the initial reading by the machine which was around 48 he was closer to around 58-62. Nonetheless it does sound as if you may have a little bit of an irregularity possibly a field although it was somewhat slow and difficult  to gauge the exact rhythm. Otherwise his wound itself seems to be showing signs of great improvement with good epithelialization. He's tolerating the Iodoflex well. 12/14/17 on evaluation today patient continues to show signs of great  improvement in my pinion. Overall I'm very pleased with the progress that is made. With that being said though he has shown improvement he tells me that for the first couple of days after we put his wrap on that he has severe pain for pretty much that entire amount of time. Nonetheless at this time I'm not seeing any evidence of infection which is good news. I think this may actually be coming from the actual dressing itself. That is the Iodoflex. 12/22/17 on evaluation today patient actually appears to be doing much better in regard to his ulcer. He's been shown signs of improvement which is great news. Fortunately there does not appear to be any evidence of infection at this time. Overall I feel like he has shown excellent progress even since just last week. 12/29/17 on evaluation today patient actually appears to be doing excellent although he does have some of the dressing material stuck to the wound bed. Fortunately there does not appear to be any signs of infection which is great news. Overall I'm very pleased with the progression of his wound. 01/05/18 on evaluation today patient actually appears to be doing rather well in regard to his ankle ulcer. He has been tolerating the dressing changes without complication. Overall very pleased. He seems to be very close to healing. 01/14/18 on evaluation today patient actually appears to be doing excellent in regard to his right lateral lower extremity wound. He's been tolerating the dressing changes without complication. Fortunately he appears to be completely healed today. Patient History Information obtained from Patient. Family History Cancer - Mother, Diabetes - Mother, Heart Disease - Maternal Grandparents, No family history of Hereditary Spherocytosis, Hypertension, Kidney Disease, Lung Disease, Seizures, Stroke, Thyroid Problems, Tuberculosis. ESSEX, PERRY ([id number]) Social History Former smoker, Marital Status - Married, Alcohol  Use - Rarely, Drug Use - No History, Caffeine Use - Moderate. Medical History Hospitalization/Surgery History - 06/28/2003, [place], cellulitis. - 07/27/2005, [facility] [place], cellulitis. - 07/28/2003, [facility], hip replacement. - 06/30/2014, [facility] ED, cellulitis. 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 Respiratory The patient has no complaints or symptoms. Cardiovascular Complains or has symptoms of LE edema. Psychiatric The patient has no complaints or symptoms. Objective Constitutional Well-nourished and well-hydrated in no acute distress. Vitals Time Taken: 10:57 AM, Height: 69 in, Weight: 320 lbs, BMI: 47.3, Temperature: 97.7 F, Pulse: 63 bpm, Respiratory Rate: 16 breaths/min, Blood Pressure: 153/68 mmHg. Respiratory normal breathing without difficulty. Psychiatric this patient is able to make decisions and demonstrates good insight into disease process. Alert and Oriented x 3. pleasant and cooperative. General Notes: Currently patient shows complete epithelialization there was some dry skin noted over the area of the wound although this was cleaned off and there appears to be no opening underline. Overall I do believe he is doing very well at this time. He does have his FarrowWrap available today for [place] to put on him for the side. Integumentary (Hair, Skin) Wound #6 status is Healed - Epithelialized. Original cause of wound was Gradually Appeared. The wound is located on the Right,Lateral Malleolus. The wound measures 0cm length x 0cm width x 0cm depth; 0cm^2 area and  0cm^3 volume. There is Fat Layer (Subcutaneous Tissue) Exposed exposed. There is no tunneling or undermining noted. There is a medium amount of serous drainage noted. The wound margin is flat and intact. There is no granulation within the wound bed. There is a  small (1-33%) amount of necrotic tissue within the wound bed including Eschar. The periwound skin appearance exhibited: Scarring, Maceration, Ecchymosis, Hemosiderin Staining. The periwound skin appearance did not exhibit: Callus, Crepitus, Excoriation, Induration, Rash, Dry/Scaly, Atrophie Blanche, Cyanosis, Mottled, Pallor, Rubor, Erythema. Periwound [name], [name] ([id number]) temperature was noted as No Abnormality. The periwound has tenderness on palpation. Assessment Active Problems ICD-10 Venous insufficiency (chronic) (peripheral) Lymphedema, not elsewhere classified Non-pressure chronic ulcer of right ankle with fat layer exposed Morbid (severe) obesity due to excess calories Plan Discharge From [facility] Services: Discharge from Wound Care Center - Please continue to wear your velcro compression wraps daily Currently patient's wound is completely healed. I do think that he is going to need to continue to utilize compression in order to prevent anything from wheat reopening. He's in agreement with this plan. We did place that on him today will see him back for reevaluation in the future as needed if anything changes or worsens. Electronic Signature(s) Signed: 01/14/2018 5:48:37 PM By: Lenda Kelp PA-C Entered By: Lenda Kelp on 01/14/2018 13:29:23 [name] ([id number]) -------------------------------------------------------------------------------- ROS/PFSH Details Patient Name: [name]. Date of Service: 01/14/2018 10:45 AM Medical Record Number: [id number] Patient Account Number: [id number] Date of Birth/Sex: [birth year] (72 y.o. M) Treating RN: Curtis Sites Primary Care Provider: Etheleen Nicks Other Clinician: Referring Provider: Etheleen Nicks Treating Provider/Extender: Linwood Dibbles, HOYT Weeks in Treatment: 10 Information Obtained From Patient Wound History Do you currently have one or more open woundso Yes How many open wounds do you currently  haveo 1 Approximately how long have you had your woundso 1 month + How have you been treating your wound(s) until nowo bandaid Has your wound(s) ever healed and then re-openedo No Have you had any lab work done in the past montho No Have you tested positive for an antibiotic resistant organism (MRSA, VRE)o No Have you tested positive for osteomyelitis (bone infection)o No Have you had any tests for circulation on your legso No Constitutional Symptoms (General Health) Complaints and Symptoms: Negative for: Fever; Chills Cardiovascular Complaints and Symptoms: Positive for: LE edema Medical History: Positive for: Hypertension Negative for: Angina; Arrhythmia; Congestive Heart Failure; Coronary Artery Disease; Deep Vein Thrombosis; Hypotension; Myocardial Infarction; Peripheral Arterial Disease; Peripheral Venous Disease; Phlebitis; Vasculitis Eyes Medical History: Negative for: Cataracts; Glaucoma; Optic Neuritis Ear/Nose/Mouth/Throat Medical History: Negative for: Chronic sinus problems/congestion; Middle ear problems Hematologic/Lymphatic Medical History: Positive for: Anemia - iron pills Negative for: Hemophilia; Human Immunodeficiency Virus; Lymphedema; Sickle Cell Disease Respiratory Complaints and Symptoms: No Complaints or Symptoms Hundal, [name] ([id number]) Medical History: Positive for: Sleep Apnea - C-pap and oxygen 1.5L at night Negative for: Aspiration; Asthma; Chronic Obstructive Pulmonary Disease (COPD); Pneumothorax; Tuberculosis Gastrointestinal Medical History: Negative for: Cirrhosis ; Colitis; Crohnos; Hepatitis A; Hepatitis B; Hepatitis C Endocrine Medical History: Negative for: Type I Diabetes; Type II Diabetes Genitourinary Medical History: Negative for: End Stage Renal Disease Immunological Medical History: Negative for: Lupus Erythematosus; Raynaudos; Scleroderma Integumentary (Skin) Medical History: Negative for: History of Burn; History of  pressure wounds Musculoskeletal Medical History: Positive for: Osteoarthritis - left hip Negative for: Gout; Rheumatoid Arthritis; Osteomyelitis Past Medical History Notes: has artificial right hip and right shoulder Neurologic Medical History: Negative for: Dementia; Neuropathy;  Quadriplegia; Paraplegia; Seizure Disorder Oncologic Medical History: Negative for: Received Chemotherapy; Received Radiation Psychiatric Complaints and Symptoms: No Complaints or Symptoms Medical History: Negative for: Anorexia/bulimia; Confinement Anxiety Immunizations Pneumococcal Vaccine: Received Pneumococcal Vaccination: No Tetanus VaccineWillette [name]: Traister, Andros R. ([id number]) Last tetanus shot: 06/28/2011 Implantable Devices Hospitalization / Surgery History Name of Hospital [place] of [place] [facility]2014 Family and Social History Cancer: Yes - Mother; Diabetes: Yes - Mother; Heart Disease: Yes - Maternal Grandparents; Hereditary Spherocytosis: No; Hypertension: No; Kidney Disease: No; Lung Disease: No; Seizures: No; Stroke: No; Thyroid Problems: No; Tuberculosis: No; Former smoker; Marital Status - Married; Alcohol Use: Rarely; Drug Use: No History; Caffeine Use: Moderate; Financial Concerns: No; Food, Clothing or Shelter Needs: No; Support System Lacking: No; Transportation Concerns: No; Advanced Directives: Yes (Not Provided); Patient does not want information on Advanced Directives; Living Will: Yes (Not Provided); Medical Power of Attorney: Yes (Not Provided) Physician Affirmation I have reviewed and agree with the above information. Electronic Signature(s) Signed: 01/14/2018 5:48:37 PM By: Lenda KelpStone III, Hoyt PA-C Signed: 01/15/2018 5:12:41 PM By: Curtis Sitesorthy, Joanna Entered By: Lenda KelpStone III, Hoyt on 01/14/2018 13:28:09 Barham, [name]  ([id number]) -------------------------------------------------------------------------------- SuperBill Details Patient Name: [name], [name] Date of Service: 01/14/2018 Medical Record Number: [id number] Patient Account Number: [id number] Date of Birth/Sex: 07/17/1945 (72 y.o. M) Treating RN: Curtis Sitesorthy, Joanna Primary Care Provider: Etheleen NicksMACDONALD, KERI Other Clinician: Referring Provider: Etheleen NicksMACDONALD, KERI Treating Provider/Extender: Linwood DibblesSTONE III, HOYT Weeks in Treatment: 10 Diagnosis Coding ICD-10 Codes Code Description I87.2 Venous insufficiency (chronic) (peripheral) I89.0 Lymphedema, not elsewhere classified L97.312 Non-pressure chronic ulcer of right ankle with fat layer exposed E66.01 Morbid (severe) obesity due to excess calories Facility Procedures CPT4 Code: [id number] Description: [phone] - WOUND CARE VISIT-LEV 2 EST PT Modifier: Quantity: 1 Physician Procedures CPT4 Code: [id number] Description: 99213 - WC PHYS LEVEL 3 - EST PT ICD-10 Diagnosis Description I87.2 Venous insufficiency (chronic) (peripheral) I89.0 Lymphedema, not elsewhere classified L97.312 Non-pressure chronic ulcer of right ankle with fat layer e E66.01 Morbid  (severe) obesity due to excess calories Modifier: xposed Quantity: 1 Electronic Signature(s) Signed: 01/14/2018 5:48:37 PM By: Lenda KelpStone III, Hoyt PA-C Entered By: Lenda KelpStone III, Hoyt on 01/14/2018 13:29:35

## 2018-01-21 ENCOUNTER — Ambulatory Visit (INDEPENDENT_AMBULATORY_CARE_PROVIDER_SITE_OTHER): Payer: Medicare Other

## 2018-01-21 DIAGNOSIS — R339 Retention of urine, unspecified: Secondary | ICD-10-CM

## 2018-01-21 NOTE — Progress Notes (Signed)
Suprapubic Cath Change  Patient is present today for a suprapubic catheter change due to urinary retention.  8ml of water was drained from the balloon, a 16FR foley cath was removed from the tract with out difficulty.  Site was cleaned and prepped in a sterile fashion with betadine.  A 16FR foley cath was replaced into the tract no complications were noted. Urine return was noted, 10 ml of sterile water was inflated into the balloon and a leg bag was attached for drainage.  Patient tolerated well. A night bag was given to patient and proper instruction was given on how to switch bags.    Preformed by: Lucilia Yanni, CMA  Follow up: 1 month  

## 2018-01-28 ENCOUNTER — Ambulatory Visit: Payer: Medicare Other

## 2018-02-26 ENCOUNTER — Ambulatory Visit (INDEPENDENT_AMBULATORY_CARE_PROVIDER_SITE_OTHER): Payer: Medicare Other

## 2018-02-26 DIAGNOSIS — R339 Retention of urine, unspecified: Secondary | ICD-10-CM | POA: Diagnosis not present

## 2018-02-26 NOTE — Addendum Note (Signed)
Addended by: Anne Fu on: 02/26/2018 02:05 PM   Modules accepted: Level of Service

## 2018-02-26 NOTE — Progress Notes (Signed)
Cath Change/ Replacement  Patient is present today for a catheter change due to urinary retention.  89ml of water was removed from the balloon, a 16FR foley cath was removed with out difficulty.  Patient was cleaned and prepped in a sterile fashion with betadine and 2% lidocaine jelly was instilled into the urethra. A 16 FR foley cath was replaced into the bladder no complications were noted Urine return was noted trace and urine was yellow in color.  Patient stated he had just emptied his bag before coming to appt. The balloon was filled with 71ml of sterile water. A leg bag was attached for drainage.  A night bag was also given to the patient and patient was given instruction on how to change from one bag to another. Patient was given proper instruction on catheter care.    Preformed by: Teressa Lower, CMA   Follow up: as scheduled 1 month

## 2018-03-23 ENCOUNTER — Ambulatory Visit (INDEPENDENT_AMBULATORY_CARE_PROVIDER_SITE_OTHER): Payer: Medicare Other | Admitting: Urology

## 2018-03-23 ENCOUNTER — Encounter: Payer: Self-pay | Admitting: Urology

## 2018-03-23 VITALS — BP 128/81 | HR 66 | Ht 70.0 in | Wt 324.0 lb

## 2018-03-23 DIAGNOSIS — N139 Obstructive and reflux uropathy, unspecified: Secondary | ICD-10-CM

## 2018-03-23 DIAGNOSIS — R339 Retention of urine, unspecified: Secondary | ICD-10-CM | POA: Diagnosis not present

## 2018-03-23 DIAGNOSIS — Z435 Encounter for attention to cystostomy: Secondary | ICD-10-CM | POA: Diagnosis not present

## 2018-03-23 NOTE — Progress Notes (Signed)
03/23/2018 12:19 PM   [name] 11-05-1945 [id number]  Referring provider: Etheleen Nicks, NP [address] [name], [place] [address]  Chief Complaint  Patient presents with  . Urinary Retention    HPI: [name] is a 73 y.o. male Caucasian with urinary retention who presents today for a catheter change.  As per Dr. Sherryl [name], the patient has been dependant on a suprapubic catheter for urinary retention since 10/2013 and morbid obesity with acquired buried phallus, and on that visit it was a month since his suprapubic catheter had become dislodged and needed to be replaced by interventional radiology.  Previous attempts at catheter placement dating back to 2005 were unsuccessful, due to the patient's anatomy, and had been previously lost to follow-up with his previous visit being on 05/03/2015.  Apparently, he had gone between then and his 04/2017 hospital visit for the suprapubic catheter falling out without having had his catheter exchanged.  On his 08/17/2017 with Dr. Lissa [name], his catheter was draining well, he was clinically not infected, and his incomplete bladder emptying was stable.  He presents today for SPT exchange.  He has no complaints.    PMH: Past Medical History:  Diagnosis Date  . Acid reflux 01/05/2013  . BPH (benign prostatic hypertrophy) with urinary retention   . Cellulitis    foot  . Chronic bacterial prostatitis   . Essential (primary) hypertension 02/21/2014  . GERD (gastroesophageal reflux disease)   . H/O: upper GI bleed   . Hypertension   . Morbid obesity with BMI of 50.0-59.9, adult (HCC)   . Obstructive sleep apnea    uses C-Pap  . Oxygen desaturation during sleep at night-1 1/2 L  . Stasis dermatitis of both legs   . Stasis dermatitis of both legs     Surgical History: Past Surgical History:  Procedure Laterality Date  . CHOLECYSTECTOMY    . COLONOSCOPY  2006  . GASTRIC BYPASS    . hip replacement Right 2005  . HIP SURGERY     right  . IR CATHETER TUBE CHANGE  05/08/2017  . PELVIC EXAMINATION UNDER ANESTHESIA CYSTOURETHROSCOPY / SIGMOIDOSCOPY  2015    Home Medications:  Allergies as of 03/23/2018      Reactions   Aspirin Other (See Comments)   Stomach bleed      Medication List       Accurate as of March 23, 2018 12:19 PM. Always use your most recent med list.        ferrous sulfate 325 (65 FE) MG tablet Take 325 mg by mouth daily.   MULTI-VITAMINS Tabs Take 1 tablet by mouth daily.   omeprazole 20 MG capsule Commonly known as:  PRILOSEC Take 20 mg by mouth daily.   PROTONIX 40 MG tablet Generic drug:  pantoprazole Take 40 mg by mouth daily. Reported on 06/20/2015   tamsulosin 0.4 MG Caps capsule Commonly known as:  FLOMAX Take 0.4 mg by mouth daily.   triamterene-hydrochlorothiazide 37.5-25 MG tablet Commonly known as:  MAXZIDE-25 Take 1 tablet by mouth daily.   vitamin C 1000 MG tablet Take 1,000 mg by mouth daily.   Vitamin-B Complex Tabs Take 1 tablet by mouth daily.       Allergies:  Allergies  Allergen Reactions  . Aspirin Other (See Comments)    Stomach bleed    Family History: Family History  Problem Relation Age of Onset  . Diabetes Mother   . Prostate cancer Neg Hx   . Bladder Cancer Neg  Hx     Social History:  reports that he quit smoking about 49 years ago. His smoking use included cigarettes and pipe. He has never used smokeless tobacco. He reports that he does not drink alcohol or use drugs.  ROS: UROLOGY Frequent Urination?: No Hard to postpone urination?: No Burning/pain with urination?: No Get up at night to urinate?: No Leakage of urine?: No Urine stream starts and stops?: No Trouble starting stream?: No Do you have to strain to urinate?: No Blood in urine?: No Urinary tract infection?: No Sexually transmitted disease?: No Injury to kidneys or bladder?: No Painful intercourse?: No Weak stream?: No Erection problems?: No Penile pain?:  No  Gastrointestinal Nausea?: No Vomiting?: No Indigestion/heartburn?: No Diarrhea?: No Constipation?: No  Constitutional Fever: No Night sweats?: No Weight loss?: No Fatigue?: No  Skin Skin rash/lesions?: No Itching?: No  Eyes Blurred vision?: No Double vision?: No  Ears/Nose/Throat Sore throat?: No Sinus problems?: No  Hematologic/Lymphatic Swollen glands?: No Easy bruising?: No  Cardiovascular Leg swelling?: No Chest pain?: No  Respiratory Cough?: No Shortness of breath?: No  Endocrine Excessive thirst?: No  Musculoskeletal Back pain?: No Joint pain?: No  Neurological Headaches?: No Dizziness?: No  Psychologic Depression?: No Anxiety?: No  Physical Exam: BP 128/81 (BP Location: Right Arm, Patient Position: Sitting)   Pulse 66   Ht 5\' 10"  (1.778 m)   Wt (!) 324 lb (147 kg)   BMI 46.49 kg/m   Constitutional:  Well nourished. Alert and oriented, No acute distress. Neurologic: Grossly intact, no focal deficits, moving all 4 extremities. Psychiatric: Normal mood and affect.  Laboratory Data: Lab Results  Component Value Date   WBC 9.7 05/06/2017   HGB 13.5 05/06/2017   HCT 39.2 (L) 05/06/2017   MCV 90.9 05/06/2017   PLT 234 05/06/2017   Lab Results  Component Value Date   CREATININE 1.15 05/06/2017   Assessment & Plan:    1. Chronic urinary retention - Continue with suprapubic catheter, changed monthly - will obtain a RUS   No follow-ups on file.  [name] Cowboy, PA-C  [facility] [address], [address] [place], [place] [address] [phone]  I, Duanne [name], am acting as a scribe for [facility], PA-C   I have reviewed the above documentation for accuracy and completeness, and I agree with the above.    [name] Cowboy, PA-C

## 2018-03-23 NOTE — Progress Notes (Signed)
Suprapubic Cath Change  Patient is present today for a suprapubic catheter change due to urinary retention.  107ml of water was drained from the balloon, a 16FR foley cath was removed from the tract with out difficulty.  Site was cleaned and prepped in a sterile fashion with betadine.  A 16FR foley cath was replaced into the tract no complications were noted. Urine return was noted, 10 ml of sterile water was inflated into the balloon and a leg bag was attached for drainage.  Patient tolerated well. A night bag was given to patient and proper instruction was given on how to switch bags.    Preformed by: Delena Bali, CMA and Eligha Bridegroom, CMA

## 2018-03-24 ENCOUNTER — Ambulatory Visit: Payer: Medicare Other

## 2018-04-02 ENCOUNTER — Ambulatory Visit
Admission: RE | Admit: 2018-04-02 | Discharge: 2018-04-02 | Disposition: A | Discharge status: Home / Self Care | Payer: Medicare Other | Source: Ambulatory Visit | Attending: Urology | Admitting: Urology

## 2018-04-02 ENCOUNTER — Other Ambulatory Visit: Payer: Self-pay

## 2018-04-02 DIAGNOSIS — N139 Obstructive and reflux uropathy, unspecified: Secondary | ICD-10-CM | POA: Diagnosis present

## 2018-04-21 ENCOUNTER — Ambulatory Visit (INDEPENDENT_AMBULATORY_CARE_PROVIDER_SITE_OTHER): Payer: Medicare Other | Admitting: Family Medicine

## 2018-04-21 ENCOUNTER — Other Ambulatory Visit: Payer: Self-pay

## 2018-04-21 DIAGNOSIS — R339 Retention of urine, unspecified: Secondary | ICD-10-CM

## 2018-04-22 NOTE — Progress Notes (Signed)
Cath Change/ Replacement  Patient is present today for a catheter change due to urinary retention.  56ml of water was removed from the balloon, a 16FR foley cath was removed with out difficulty.  Patient was cleaned and prepped in a sterile fashion with betadine and 2% lidocaine jelly was instilled into the urethra. A 16 FR foley cath was replaced into the bladder no complications were noted Urine return was noted 61ml and urine was yellow in color. The balloon was filled with 44ml of sterile water. A leg bag was attached for drainage. Patient was given proper instruction on catheter care.    Preformed by: Teressa Lower, CMA  Follow up: 1 month

## 2018-05-25 ENCOUNTER — Ambulatory Visit: Payer: Medicare Other

## 2018-05-26 ENCOUNTER — Other Ambulatory Visit: Payer: Self-pay

## 2018-05-26 ENCOUNTER — Ambulatory Visit (INDEPENDENT_AMBULATORY_CARE_PROVIDER_SITE_OTHER): Payer: Medicare Other

## 2018-05-26 DIAGNOSIS — R339 Retention of urine, unspecified: Secondary | ICD-10-CM

## 2018-05-26 NOTE — Progress Notes (Signed)
Suprapubic Cath Change  Patient is present today for a suprapubic catheter change due to urinary retention.  44ml of water was drained from the balloon, a 16FR foley cath was removed from the tract with out difficulty.  Site was cleaned and prepped in a sterile fashion with betadine.  A 16FR foley cath was replaced into the tract no complications were noted. Urine return was noted, 10 ml of sterile water was inflated into the balloon and a leg bag was attached for drainage.  Patient tolerated well. A night bag was given to patient and proper instruction was given on how to switch bags.    Preformed by: Debbe Bales, CMA (AAMA)  Follow up: As scheduled on 1 month

## 2018-06-23 ENCOUNTER — Ambulatory Visit (INDEPENDENT_AMBULATORY_CARE_PROVIDER_SITE_OTHER): Payer: Medicare Other | Admitting: Family Medicine

## 2018-06-23 ENCOUNTER — Other Ambulatory Visit: Payer: Self-pay

## 2018-06-23 DIAGNOSIS — R339 Retention of urine, unspecified: Secondary | ICD-10-CM

## 2018-06-23 NOTE — Progress Notes (Signed)
Suprapubic Cath Change  Patient is present today for a suprapubic catheter change due to urinary retention.  14ml of water was drained from the balloon, a 16FR foley cath was removed from the tract with out difficulty.  Site was cleaned and prepped in a sterile fashion with betadine.  A 16FR foley cath was replaced into the tract no complications were noted. Urine return was noted, 10 ml of sterile water was inflated into the balloon and a leg bag was attached for drainage.  Patient tolerated well.   Preformed by: Teressa Lower, CMA  Follow up: 1 month

## 2018-07-26 ENCOUNTER — Other Ambulatory Visit: Payer: Self-pay

## 2018-07-26 ENCOUNTER — Ambulatory Visit (INDEPENDENT_AMBULATORY_CARE_PROVIDER_SITE_OTHER): Payer: Medicare Other | Admitting: Family Medicine

## 2018-07-26 DIAGNOSIS — R339 Retention of urine, unspecified: Secondary | ICD-10-CM | POA: Diagnosis not present

## 2018-07-26 NOTE — Progress Notes (Signed)
Suprapubic Cath Change  Patient is present today for a suprapubic catheter change due to urinary retention.  10ml of water was drained from the balloon, a 16FR foley cath was removed from the tract with out difficulty.  Site was cleaned and prepped in a sterile fashion with betadine.  A 16FR foley cath was replaced into the tract no complications were noted. Urine return was noted, 10 ml of sterile water was inflated into the balloon and a leg bag was attached for drainage.  Patient tolerated well.  Preformed by: Nakyah Erdmann, CMA  Follow up: 1 month  

## 2018-08-26 ENCOUNTER — Ambulatory Visit: Payer: Medicare Other

## 2018-08-26 ENCOUNTER — Other Ambulatory Visit: Payer: Self-pay

## 2018-09-23 ENCOUNTER — Ambulatory Visit (INDEPENDENT_AMBULATORY_CARE_PROVIDER_SITE_OTHER): Payer: Medicare Other | Admitting: Family Medicine

## 2018-09-23 ENCOUNTER — Other Ambulatory Visit: Payer: Self-pay

## 2018-09-23 DIAGNOSIS — R339 Retention of urine, unspecified: Secondary | ICD-10-CM | POA: Diagnosis not present

## 2018-09-23 NOTE — Progress Notes (Signed)
Suprapubic Cath Change  Patient is present today for a suprapubic catheter change due to urinary retention.  53ml of water was drained from the balloon, a 16FR foley cath was removed from the tract with out difficulty.  Site was cleaned and prepped in a sterile fashion with betadine.  A 16FR foley cath was replaced into the tract no complications were noted. Urine return was noted, 10 ml of sterile water was inflated into the balloon and a leg bag was attached for drainage.  Patient tolerated well. A night bag was given to patient and proper instruction was given on how to switch bags.    Preformed by: Elberta Leatherwood, CMA  Follow up: 1 month SPT change

## 2018-10-28 ENCOUNTER — Other Ambulatory Visit: Payer: Self-pay

## 2018-10-28 ENCOUNTER — Ambulatory Visit (INDEPENDENT_AMBULATORY_CARE_PROVIDER_SITE_OTHER): Payer: Medicare Other | Admitting: *Deleted

## 2018-10-28 DIAGNOSIS — R339 Retention of urine, unspecified: Secondary | ICD-10-CM | POA: Diagnosis not present

## 2018-10-28 NOTE — Progress Notes (Signed)
Suprapubic Cath Change  Patient is present today for a suprapubic catheter change due to urinary retention.  9ml of water was drained from the balloon, a 16FR foley cath was removed from the tract with out difficulty.  Site was cleaned and prepped in a sterile fashion with betadine.  A 16FR foley cath was replaced into the tract no complications were noted. Urine return was noted, 10 ml of sterile water was inflated into the balloon and a leg bag and extender tube was attached for drainage.  Patient tolerated well. A night bag was given to patient and proper instruction was given on how to switch bags.    Performed TI:[id number] Morris, CMA  Follow up: One month

## 2018-11-29 ENCOUNTER — Other Ambulatory Visit: Payer: Self-pay

## 2018-11-29 ENCOUNTER — Ambulatory Visit (INDEPENDENT_AMBULATORY_CARE_PROVIDER_SITE_OTHER): Payer: Medicare Other | Admitting: Physician Assistant

## 2018-11-29 ENCOUNTER — Encounter: Payer: Self-pay | Admitting: Physician Assistant

## 2018-11-29 VITALS — BP 155/70 | HR 69 | Ht 70.0 in | Wt 308.0 lb

## 2018-11-29 DIAGNOSIS — Z435 Encounter for attention to cystostomy: Secondary | ICD-10-CM

## 2018-11-29 DIAGNOSIS — L304 Erythema intertrigo: Secondary | ICD-10-CM | POA: Diagnosis not present

## 2018-11-29 NOTE — Progress Notes (Signed)
Suprapubic Cath Change  Patient is present today for a suprapubic catheter change due to urinary retention.  69ml of water was drained from the balloon, a 16FR foley cath was removed from the tract with out difficulty.  Site was cleaned and prepped in a sterile fashion with betadine.  A 16FR foley cath was replaced into the tract no complications were noted. Urine return was noted, 10 ml of sterile water was inflated into the balloon and a leg bag was attached for drainage.  Patient tolerated well. A night bag was given to patient and proper instruction was given on how to switch bags.    Performed by: Debroah Loop, PA-C and Elizabeth Palau, CMA  Follow up: Return in about 4 weeks (around 12/27/2018) for SPT exchange.   Additional notes: Some moisture noted adjacent to SPT site with surrounding erythema, no satellite lesions visualized. Patient denies pain or pruritis at the site. I suspect intertrigo. I recommended patient to initiate OTC antifungal moisture absorbant powder in his intertriginous region to remedy this. He expressed understanding. Debroah Loop, PA-C

## 2018-12-27 ENCOUNTER — Ambulatory Visit (INDEPENDENT_AMBULATORY_CARE_PROVIDER_SITE_OTHER): Payer: Medicare Other | Admitting: Physician Assistant

## 2018-12-27 ENCOUNTER — Other Ambulatory Visit: Payer: Self-pay

## 2018-12-27 DIAGNOSIS — R339 Retention of urine, unspecified: Secondary | ICD-10-CM | POA: Diagnosis not present

## 2018-12-27 NOTE — Progress Notes (Signed)
Suprapubic Cath Change  Patient is present today for a suprapubic catheter change due to urinary retention.  73ml of water was drained from the balloon, a 16FR foley cath was removed from the tract with out difficulty.  Site was cleaned and prepped in a sterile fashion with betadine.  A 16FR foley cath was replaced into the tract no complications were noted. Urine return was noted, 10 ml of sterile water was inflated into the balloon and the catheter was attached to the patient's new leg bag. Patient tolerated well. An additional leg and night bag were given to patient and proper instruction was given on how to switch bags.    Performed by: Debroah Loop, PA-C and Fonnie Jarvis, CMA  Follow up: Return in about 4 weeks (around 01/24/2019) for SPT exchange.

## 2019-01-24 ENCOUNTER — Ambulatory Visit (INDEPENDENT_AMBULATORY_CARE_PROVIDER_SITE_OTHER): Payer: Medicare Other | Admitting: Physician Assistant

## 2019-01-24 ENCOUNTER — Encounter: Payer: Self-pay | Admitting: Physician Assistant

## 2019-01-24 ENCOUNTER — Other Ambulatory Visit: Payer: Self-pay

## 2019-01-24 VITALS — BP 140/80 | HR 82

## 2019-01-24 DIAGNOSIS — R339 Retention of urine, unspecified: Secondary | ICD-10-CM

## 2019-01-24 NOTE — Progress Notes (Signed)
Suprapubic Cath Change  Patient is present today for a suprapubic catheter change due to urinary retention.  8.53ml of water was drained from the balloon, a 16FR foley cath was removed from the tract with out difficulty.  Site was cleaned and prepped in a sterile fashion with betadine.  A 16FR foley cath attached to extension tubing was replaced into the tract no complications were noted. No urine return was noted, 10 ml of sterile water was inflated into the balloon and a leg bag was attached for drainage.  Patient tolerated well. An additional leg bag was given to patient and proper instruction was given on how to switch bags.    Performed by: Debroah Loop, PA-C and Gaspar Cola, CMA  Follow up: Patient counseled to contact the office if he does not note urinary return in the next two hours. He expressed understanding. Return in about 4 weeks (around 02/21/2019) for SPT exchange.

## 2019-02-21 ENCOUNTER — Ambulatory Visit (INDEPENDENT_AMBULATORY_CARE_PROVIDER_SITE_OTHER): Payer: Medicare Other | Admitting: Physician Assistant

## 2019-02-21 ENCOUNTER — Other Ambulatory Visit: Payer: Self-pay

## 2019-02-21 ENCOUNTER — Encounter: Payer: Self-pay | Admitting: Physician Assistant

## 2019-02-21 DIAGNOSIS — R339 Retention of urine, unspecified: Secondary | ICD-10-CM | POA: Diagnosis not present

## 2019-02-21 NOTE — Patient Instructions (Signed)
Suprapubic Catheter Replacement  Suprapubic catheter replacement is a procedure to remove an old catheter and insert a new, clean catheter. A suprapubic catheter is a rubber tube that drains urine from the bladder into a collection bag outside the body. The catheter is inserted into the bladder through a small opening in the lower abdomen, near the center of the body, above the pubic bone (suprapubicarea). There is a tiny balloon filled with germ-free (sterile) water on the end of the catheter that is in the bladder. The balloon helps to keep the catheter in place. If you need to wear a catheter for a long period of time, you may be instructed to replace the catheter yourself. Usually, suprapubic catheters need to be replaced every 4-6 weeks, or as often as told by your health care provider. What are the risks? Generally, this is a safe procedure. However, problems may occur, including failure to get the catheter into the bladder. What happens before the procedure?  You may have an exam or testing, including a blood or urine sample.  Ask your health care provider what steps will be taken to help prevent infection. What happens during the procedure?  You will lie on your back.  The water from the balloon will be removed using a syringe.  The catheter will be slowly removed.  Lubricant will be applied to the end of the new catheter that will go into your bladder.  The new catheter will be inserted through the opening in your abdomen. Your health care provider will slide the catheter into your bladder.  Your health care provider will wait for some urine to start flowing through the catheter. When this happens, a syringe will be used to fill the balloon with sterile water.  A collection bag will be attached to the end of the catheter. The procedure may vary among health care providers and hospitals. What can I expect after procedure? After the procedure, it is common to have:  Some  discomfort around the opening in your abdomen. Follow these instructions at home: Caring for the skin around the catheter Use a clean washcloth and soapy water to clean the skin around your catheter every day. Pat the area dry with a clean towel.  Do not pull on the catheter.  Do not use ointment or lotion on this area unless told by your health care provider.  Check the skin around the catheter every day for signs of infection. Check for: ? Redness, swelling, or pain. ? Fluid or blood. ? Warmth. ? Pus or a bad smell. Caring for the catheter  Clean the catheter with soap and water as often as told by your health care provider.  Always make sure there are no twists or curls (kinks) in the catheter.  As soon as you are able to move, you may use a leg bag to collect the urine. ? Make sure that the tubing is straight and without kinks. ? Wrap an ace bandage gently over the tubing and around your leg to minimize the risk of the bag getting pulled out. Emptying the collection bag Empty the large collection bag every 8 hours. Empty the small collection bag when it is about ? full. To empty your large or small collection bag, take the following steps:  Always keep the bag below the level of the catheter. This keeps urine from flowing backward into the catheter.  Hold the bag over the toilet or another container. Turn the valve (spigot) at the bottom of   the bag to empty the urine. ? Do not touch the opening of the spigot. ? Do not let the opening touch the toilet or container.  Close the spigot tightly when the bag is empty. Cleaning the collection bag   Wash your hands with soap and water. If soap and water are not available, use hand sanitizer.  Disconnect the bag from the catheter and immediately attach a new bag to the catheter.  Empty the used bag completely.  Clean the used bag according to the manufacturer's instructions, or as told by your health care provider.  Let the bag  dry completely, and put it in a clean plastic bag before storing it. General instructions   Always wash your hands before and after caring for your catheter and collection bag. Use soap and water. If soap and water are not available, use hand sanitizer.  Always make sure there are no leaks in the catheter or collection bag.  Drink enough fluid to keep your urine pale yellow.  If you were prescribed an antibiotic medicine, take it as told by your health care provider. Do not stop taking the antibiotic even if you start to feel better.  Do not take baths, swim, or use a hot tub.  Keep all follow-up visits as told by your health care provider. This is important. Contact a health care provider if:  You leak urine.  You have redness, swelling, or pain around your catheter opening.  You have fluid or blood coming from your catheter opening.  Your catheter opening feels warm to the touch.  You have pus or a bad smell coming from your catheter opening.  You have a fever or chills.  Your urine flow slows down.  Your urine becomes cloudy or smelly. Get help right away if:  Your catheter comes out.  You feel nauseous.  You have back pain.  You have difficulty changing your catheter.  You have blood in your urine.  You have no urine flow for 1 hour. Summary  Suprapubic catheter replacement is a procedure to remove an old catheter and insert a new, clean catheter.  Make sure that you understand how to care for your catheter, your collection bag, and the opening in your abdomen.  Always wash your hands before and after caring for your catheter and collection bag.  Contact a health care provider if you leak urine or have a fever or any signs of infection around the catheter opening, or if your urine becomes cloudy or smelly.  Get help right away if your catheter comes out or you have nausea, back pain, blood in your urine, no urine flow for 1 hour, or difficulty changing your  catheter. This information is not intended to replace advice given to you by your health care provider. Make sure you discuss any questions you have with your health care provider. Document Revised: 09/02/2017 Document Reviewed: 09/02/2017 [employer] Patient Education  2020 [employer]  

## 2019-02-21 NOTE — Progress Notes (Signed)
Suprapubic Cath Change  Patient is present today for a suprapubic catheter change due to urinary retention.  8.51ml of water was drained from the balloon, a 16FR foley cath was removed from the tract with out difficulty.  Site was cleaned and prepped in a sterile fashion with betadine.  A 16FR foley cath attached to extension tubing was replaced into the tract no complications were noted. Urine return was not noted, however I was able to easily advance the catheter and pull back to meet resistance at the abdominal wall to confirm placement; I instructed patient to return to clinic if he did not notice urinary return within an hour and he expressed understanding. 10 ml of sterile water was inflated into the balloon and a leg bag was attached for drainage.  Patient tolerated well. Patient was given an additional leg bag and proper instruction was given on how to switch bags.    Performed by: Carman Ching, PA-C   Follow up: Return in about 4 weeks (around 03/21/2019) for SPT exchange.

## 2019-03-12 ENCOUNTER — Ambulatory Visit: Payer: Medicare Other

## 2019-03-21 ENCOUNTER — Other Ambulatory Visit: Payer: Self-pay

## 2019-03-21 ENCOUNTER — Encounter: Payer: Self-pay | Admitting: Physician Assistant

## 2019-03-21 ENCOUNTER — Ambulatory Visit (INDEPENDENT_AMBULATORY_CARE_PROVIDER_SITE_OTHER): Payer: Medicare Other | Admitting: Physician Assistant

## 2019-03-21 DIAGNOSIS — R339 Retention of urine, unspecified: Secondary | ICD-10-CM | POA: Diagnosis not present

## 2019-03-21 NOTE — Progress Notes (Signed)
Suprapubic Cath Change  Patient is present today for a suprapubic catheter change due to urinary retention.  74ml of water was drained from the balloon, a 16FR foley cath was removed from the tract with out difficulty.  Site was cleaned and prepped in a sterile fashion with betadine.  A 16FR foley cath was replaced into the tract no complications were noted. Urine return was not noted as is normal for him; he was counseled to return to clinic if he did not notice return within an hour. 10 ml of sterile water was inflated into the balloon and a leg bag with extension tubing was attached for drainage.  Patient tolerated well. Patient declined an additional leg or night bag.    Performed by: Carman Ching, PA-C and Franchot Erichsen, CMA  Follow up: Return in about 4 weeks (around 04/18/2019) for SPT exchange.

## 2019-04-18 ENCOUNTER — Other Ambulatory Visit: Payer: Self-pay

## 2019-04-18 ENCOUNTER — Ambulatory Visit (INDEPENDENT_AMBULATORY_CARE_PROVIDER_SITE_OTHER): Payer: Medicare Other | Admitting: Physician Assistant

## 2019-04-18 VITALS — BP 151/70 | HR 76 | Ht 70.0 in | Wt 308.0 lb

## 2019-04-18 DIAGNOSIS — Z435 Encounter for attention to cystostomy: Secondary | ICD-10-CM | POA: Diagnosis not present

## 2019-04-18 NOTE — Progress Notes (Signed)
Suprapubic Cath Change  Patient is present today for a suprapubic catheter change due to urinary retention.  34ml of water was drained from the balloon, a 16FR foley cath was removed from the tract without difficulty.  Site was cleaned and prepped in a sterile fashion with betadine.  A 16FR foley cath was replaced into the tract no complications were noted. Urine return was not noted as is typical for him; patient was counseled to RTC if return did not occur within an hour. 10 ml of sterile water was inflated into the balloon and a leg bag with extension tubing was attached for drainage.  Patient tolerated well and declined a night bag.    Performed by: Carman Ching, PA-C and Franchot Erichsen, CMA  Follow up: Return in about 4 weeks (around 05/16/2019) for SPT exchange.

## 2019-05-16 ENCOUNTER — Other Ambulatory Visit: Payer: Self-pay

## 2019-05-16 ENCOUNTER — Ambulatory Visit (INDEPENDENT_AMBULATORY_CARE_PROVIDER_SITE_OTHER): Payer: Medicare Other | Admitting: Physician Assistant

## 2019-05-16 VITALS — BP 125/72 | HR 78 | Ht 70.0 in | Wt 308.0 lb

## 2019-05-16 DIAGNOSIS — R339 Retention of urine, unspecified: Secondary | ICD-10-CM | POA: Diagnosis not present

## 2019-05-16 NOTE — Progress Notes (Signed)
Suprapubic Cath Change  Patient is present today for a suprapubic catheter change due to urinary retention.  51ml of water was drained from the balloon, a 16FR foley cath was removed from the tract without difficulty.  Site was cleaned and prepped in a sterile fashion with betadine.  A 16FR foley cath was replaced into the tract no complications were noted. Urine return was not noted as is typical for him; patient was counseled to RTC if return did not occur within an hour. 10 ml of sterile water was inflated into the balloon and a leg bag with extension tubing bag was attached for drainage.  Patient tolerated well.    Additional notes: Used silver nitrate to cauterize two sites of granulation tissue at the opening of the tract. Patient tolerated well.  Performed by: Carman Ching, PA-C, Eligha Bridegroom, CMA, and Franchot Erichsen, CMA  Follow up: Return in about 4 weeks (around 06/13/2019) for SPT exchange.

## 2019-06-15 ENCOUNTER — Ambulatory Visit: Payer: Self-pay | Admitting: Physician Assistant

## 2019-06-20 ENCOUNTER — Other Ambulatory Visit: Payer: Self-pay

## 2019-06-20 ENCOUNTER — Ambulatory Visit (INDEPENDENT_AMBULATORY_CARE_PROVIDER_SITE_OTHER): Payer: Medicare Other | Admitting: Physician Assistant

## 2019-06-20 ENCOUNTER — Encounter: Payer: Self-pay | Admitting: Physician Assistant

## 2019-06-20 VITALS — BP 159/77 | HR 64 | Ht 70.0 in | Wt 308.0 lb

## 2019-06-20 DIAGNOSIS — R339 Retention of urine, unspecified: Secondary | ICD-10-CM

## 2019-06-20 NOTE — Progress Notes (Signed)
Suprapubic Cath Change  Patient is present today for a suprapubic catheter change due to urinary retention.  77ml of water was drained from the balloon, a 16FR foley cath was removed from the tract with out difficulty.  Site was cleaned and prepped in a sterile fashion with betadine.  A 16FR foley cath was replaced into the tract complications were noted as: Granulation tissue was noted at stoma site, silver nitrate was used to remove.. Urine return was noted, 10 ml of sterile water was inflated into the balloon and a leg bag was attached for drainage.  Patient tolerated well.   Preformed by: Franchot Erichsen, CMA  Follow up: RTC 1 month for SPT exchange

## 2019-07-18 ENCOUNTER — Other Ambulatory Visit: Payer: Self-pay

## 2019-07-18 ENCOUNTER — Ambulatory Visit (INDEPENDENT_AMBULATORY_CARE_PROVIDER_SITE_OTHER): Payer: Medicare Other | Admitting: Physician Assistant

## 2019-07-18 DIAGNOSIS — R339 Retention of urine, unspecified: Secondary | ICD-10-CM | POA: Diagnosis not present

## 2019-07-18 NOTE — Progress Notes (Signed)
Suprapubic Cath Change  Patient is present today for a suprapubic catheter change due to urinary retention.  85ml of water was drained from the balloon, a 16FR foley cath was removed from the tract with out difficulty.  Site was cleaned and prepped in a sterile fashion with betadine.  A 16FR foley cath was replaced into the tract no complications were noted. Urine return was noted, 10 ml of sterile water was inflated into the balloon and a leg bag was attached for drainage.  Patient tolerated well.     Preformed by: Teressa Lower, CMA, Ples Specter, CMA  Follow up: 1 month SPT change

## 2019-07-20 ENCOUNTER — Other Ambulatory Visit: Payer: Self-pay

## 2019-07-20 ENCOUNTER — Encounter: Payer: Medicare Other | Attending: Internal Medicine | Admitting: Internal Medicine

## 2019-07-20 DIAGNOSIS — Z886 Allergy status to analgesic agent status: Secondary | ICD-10-CM | POA: Insufficient documentation

## 2019-07-20 DIAGNOSIS — I872 Venous insufficiency (chronic) (peripheral): Secondary | ICD-10-CM | POA: Diagnosis not present

## 2019-07-20 DIAGNOSIS — Z833 Family history of diabetes mellitus: Secondary | ICD-10-CM | POA: Diagnosis not present

## 2019-07-20 DIAGNOSIS — Z9884 Bariatric surgery status: Secondary | ICD-10-CM | POA: Insufficient documentation

## 2019-07-20 DIAGNOSIS — L97312 Non-pressure chronic ulcer of right ankle with fat layer exposed: Secondary | ICD-10-CM | POA: Insufficient documentation

## 2019-07-20 DIAGNOSIS — Z6841 Body Mass Index (BMI) 40.0 and over, adult: Secondary | ICD-10-CM | POA: Insufficient documentation

## 2019-07-20 DIAGNOSIS — Z8249 Family history of ischemic heart disease and other diseases of the circulatory system: Secondary | ICD-10-CM | POA: Diagnosis not present

## 2019-07-20 DIAGNOSIS — M1612 Unilateral primary osteoarthritis, left hip: Secondary | ICD-10-CM | POA: Insufficient documentation

## 2019-07-20 DIAGNOSIS — I10 Essential (primary) hypertension: Secondary | ICD-10-CM | POA: Insufficient documentation

## 2019-07-20 DIAGNOSIS — G4733 Obstructive sleep apnea (adult) (pediatric): Secondary | ICD-10-CM | POA: Insufficient documentation

## 2019-07-20 DIAGNOSIS — I890 Lymphedema, not elsewhere classified: Secondary | ICD-10-CM | POA: Diagnosis not present

## 2019-07-20 NOTE — Progress Notes (Signed)
IFEOLUWA, BELLER ([id number]) Visit Report for 07/20/2019 Allergy List Details Patient Name: MANCIL, PFENNING. Date of Service: 07/20/2019 12:45 PM Medical Record Number: [id number] Patient Account Number: [id number] Date of Birth/Sex: 05-28-1945 (74 y.o. M) Treating RN: Montey Hora Primary Care Fahim Kats: Verita Lamb Other Clinician: Referring Zadkiel Dragan: Referral, Self Treating Dequarius Jeffries/Extender: Ricard Dillon Weeks in Treatment: 0 Allergies Active Allergies aspirin Allergy Notes Electronic Signature(s) Signed: 07/20/2019 3:45:40 PM By: Montey Hora Entered By: Montey Hora on 07/20/2019 13:08:33 [place], Fonnie Jarvis ([id number]) -------------------------------------------------------------------------------- Arrival Information Details Patient Name: Darlina Sicilian. Date of Service: 07/20/2019 12:45 PM Medical Record Number: [id number] Patient Account Number: [id number] Date of Birth/Sex: Jan 06, 1946 (74 y.o. M) Treating RN: Montey Hora Primary Care Ramello Cordial: Verita Lamb Other Clinician: Referring Broly Hatfield: Referral, Self Treating Piercen Covino/Extender: Tito Dine in Treatment: 0 Visit Information Patient Arrived: Crutches Arrival Time: 13:02 Accompanied By: spouse Transfer Assistance: None Patient Identification Verified: Yes Secondary Verification Process Completed: Yes History Since Last Visit Added or deleted any medications: No Any new allergies or adverse reactions: No Had a fall or experienced change in activities of daily living that may affect risk of falls: No Signs or symptoms of abuse/neglect since last visito No Hospitalized since last visit: No Implantable device outside of the clinic excluding cellular tissue based products placed in the center since last visit: No Electronic Signature(s) Signed: 07/20/2019 3:45:40 PM By: Montey Hora Entered By: Montey Hora on 07/20/2019 13:02:31 Klutts, Fonnie Jarvis  ([id number]) -------------------------------------------------------------------------------- Clinic Level of Care Assessment Details Patient Name: Darlina Sicilian. Date of Service: 07/20/2019 12:45 PM Medical Record Number: [id number] Patient Account Number: [id number] Date of Birth/Sex: March 22, 1945 (74 y.o. M) Treating RN: Cornell Barman Primary Care Aliani Caccavale: Verita Lamb Other Clinician: Referring Radha Coggins: Referral, Self Treating Travers Goodley/Extender: Tito Dine in Treatment: 0 Clinic Level of Care Assessment Items TOOL 1 Quantity Score []  - Use when EandM and Procedure is performed on INITIAL visit 0 ASSESSMENTS - Nursing Assessment / Reassessment X - General Physical Exam (combine w/ comprehensive assessment (listed just below) when performed on new 1 20 pt. evals) X- 1 25 Comprehensive Assessment (HX, ROS, Risk Assessments, Wounds Hx, etc.) ASSESSMENTS - Wound and Skin Assessment / Reassessment []  - Dermatologic / Skin Assessment (not related to wound area) 0 ASSESSMENTS - Ostomy and/or Continence Assessment and Care []  - Incontinence Assessment and Management 0 []  - 0 Ostomy Care Assessment and Management (repouching, etc.) PROCESS - Coordination of Care X - Simple Patient / Family Education for ongoing care 1 15 []  - 0 Complex (extensive) Patient / Family Education for ongoing care []  - 0 Staff obtains Programmer, systems, Records, Test Results / Process Orders []  - 0 Staff telephones HHA, Nursing Homes / Clarify orders / etc []  - 0 Routine Transfer to another Facility (non-emergent condition) []  - 0 Routine Hospital Admission (non-emergent condition) X- 1 15 New Admissions / Biomedical engineer / Ordering NPWT, Apligraf, etc. []  - 0 Emergency Hospital Admission (emergent condition) PROCESS - Special Needs []  - Pediatric / Minor Patient Management 0 []  - 0 Isolation Patient Management []  - 0 Hearing / Language / Visual special needs []  - 0 Assessment of  Community assistance (transportation, D/C planning, etc.) []  - 0 Additional assistance / Altered mentation []  - 0 Support Surface(s) Assessment (bed, cushion, seat, etc.) INTERVENTIONS - Miscellaneous []  - External ear exam 0 []  - 0 Patient Transfer (multiple staff / Civil Service fast streamer / Similar devices) []  - 0 Simple Staple / Suture removal (25 or less) []  -  0 Complex Staple / Suture removal (26 or more) []  - 0 Hypo/Hyperglycemic Management (do not check if billed separately) X- 1 15 Ankle / Brachial Index (ABI) - do not check if billed separately Has the patient been seen at the hospital within the last three years: Yes Total Score: 90 Level Of Care: New/Established - Level 3 LEOVANNI, BJORKMAN ([name]) Electronic Signature(s) Signed: 07/20/2019 3:55:21 PM By: 07/22/2019, BSN, RN, CWS, Kim RN, BSN Entered By: Elliot Gurney, BSN, RN, CWS, Kim on 07/20/2019 13:31:53 [place], 07/22/2019 (Jorje Guild) -------------------------------------------------------------------------------- Encounter Discharge Information Details Patient Name: [id number]. Date of Service: 07/20/2019 12:45 PM Medical Record Number: 07/22/2019 Patient Account Number: [id number] Date of Birth/Sex: [birth year] (74 y.o. M) Treating RN: 62 Primary Care Naftali Carchi: Huel Coventry Other Clinician: Referring Modesty Rudy: Referral, Self Treating Siriah Treat/Extender: Etheleen Nicks in Treatment: 0 Encounter Discharge Information Items Post Procedure Vitals Discharge Condition: Stable Temperature (F): 98.2 Ambulatory Status: Crutches Pulse (bpm): 56 Discharge Destination: Home Respiratory Rate (breaths/min): 18 Transportation: Private Auto Blood Pressure (mmHg): 131/61 Accompanied By: spouse Schedule Follow-up Appointment: Yes Clinical Summary of Care: Electronic Signature(s) Signed: 07/20/2019 3:55:21 PM By: 07/22/2019, BSN, RN, CWS, Kim RN, BSN Entered By: Elliot Gurney, BSN, RN, CWS, Kim on 07/20/2019 13:34:24 [place], 07/22/2019  (Jorje Guild) -------------------------------------------------------------------------------- Lower Extremity Assessment Details Patient Name: AVELINO, HERREN R. Date of Service: 07/20/2019 12:45 PM Medical Record Number: 07/22/2019 Patient Account Number: [id number] Date of Birth/Sex: 06-28-1945 (74 y.o. M) Treating RN: 62 Primary Care Julious Langlois: Curtis Sites Other Clinician: Referring Caylin Raby: Referral, Self Treating Karey Stucki/Extender: Etheleen Nicks in Treatment: 0 Edema Assessment Assessed: [Left: No] [Right: No] Edema: [Left: Yes] [Right: Yes] Calf Left: Right: Point of Measurement: 34 cm From Medial Instep 43 cm 42 cm Ankle Left: Right: Point of Measurement: 12 cm From Medial Instep 23.5 cm 25 cm Vascular Assessment Pulses: Dorsalis Pedis Palpable: [Left:Yes] [Right:Yes] Doppler Audible: [Left:Yes] [Right:Yes] Posterior Tibial Palpable: [Left:Yes] [Right:Yes] Doppler Audible: [Left:Yes] [Right:Yes] Blood Pressure: Brachial: [Left:126] Dorsalis Pedis: 150 [Left:Dorsalis Pedis: 148] Ankle: Posterior Tibial: 154 [Left:Posterior Tibial: 142 1.22] [Right:1.17] Electronic Signature(s) Signed: 07/20/2019 3:45:40 PM By: 07/22/2019 Entered By: Curtis Sites on 07/20/2019 13:16:53 Cua, Chancellor R. (07/22/2019) -------------------------------------------------------------------------------- Multi Wound Chart Details Patient Name: [id number] R. Date of Service: 07/20/2019 12:45 PM Medical Record Number: 07/22/2019 Patient Account Number: [id number] Date of Birth/Sex: [birth year] (74 y.o. M) Treating RN: 62 Primary Care Taysia Rivere: Huel Coventry Other Clinician: Referring Alexcis Bicking: Referral, Self Treating Kenlie Seki/Extender: Etheleen Nicks in Treatment: 0 Vital Signs Height(in): 69 Pulse(bpm): 56 Weight(lbs): 320 Blood Pressure(mmHg): 131/61 Body Mass Index(BMI): 47 Temperature(F): 98.2 Respiratory Rate(breaths/min): 18 Photos:  [N/A:N/A] Wound Location: Right, Lateral Ankle N/A N/A Wounding Event: Blister N/A N/A Primary Etiology: Venous Leg Ulcer N/A N/A Date Acquired: 06/20/2019 N/A N/A Weeks of Treatment: 0 N/A N/A Wound Status: Open N/A N/A Measurements L x W x D (cm) 2.6x2.6x0.2 N/A N/A Area (cm) : 5.309 N/A N/A Volume (cm) : 1.062 N/A N/A Classification: Full Thickness Without Exposed N/A N/A Support Structures Exudate Amount: Medium N/A N/A Exudate Type: Serous N/A N/A Exudate Color: amber N/A N/A Wound Margin: Flat and Intact N/A N/A Granulation Amount: Medium (34-66%) N/A N/A Granulation Quality: Pink N/A N/A Necrotic Amount: Medium (34-66%) N/A N/A Exposed Structures: Fat Layer (Subcutaneous Tissue) N/A N/A Exposed: Yes Fascia: No Tendon: No Muscle: No Joint: No Bone: No Epithelialization: None N/A N/A Debridement: Debridement - Excisional N/A N/A Pre-procedure Verification/Time 13:22 N/A N/A Out Taken: Pain Control: Lidocaine N/A  N/A Tissue Debrided: Subcutaneous, Slough N/A N/A Level: Skin/Subcutaneous Tissue N/A N/A Debridement Area (sq cm): 6.76 N/A N/A Instrument: Curette N/A N/A Bleeding: Moderate N/A N/A Hemostasis Achieved: Silver Nitrate N/A N/A Debridement Treatment Procedure was tolerated well N/A N/A Response: Post Debridement 2.6x2.6x0.3 N/A N/A Measurements L x W x D (cm) Post Debridement Volume: 1.593 N/A N/A (cm) Procedures Performed: Debridement N/A N/A CASANOVA, SCHURMAN R. ([id number]) Treatment Notes Wound #7 (Right, Lateral Ankle) 1. Cleansed with: Cleanse wound with antibacterial soap and water May shower with protection 2. Anesthetic Topical Lidocaine 4% cream to wound bed prior to debridement 4. Dressing Applied: Iodoflex 5. Secondary Dressing Applied ABD Pad 7. Secured with 3 Layer Compression System - Right Lower Extremity Patient to wear own compression stockings Electronic Signature(s) Signed: 07/20/2019 3:45:53 PM By: Baltazar Najjar MD Entered  By: Baltazar Najjar on 07/20/2019 13:34:17 Lacey, Jorje Guild ([id number]) -------------------------------------------------------------------------------- Multi-Disciplinary Care Plan Details Patient Name: [name] Date of Service: 07/20/2019 12:45 PM Medical Record Number: [id number] Patient Account Number: [id number] Date of Birth/Sex: 22-Oct-1945 (74 y.o. M) Treating RN: Huel Coventry Primary Care Elma Limas: Etheleen Nicks Other Clinician: Referring Trai Ells: Referral, Self Treating Krisa Blattner/Extender: Altamese [place] in Treatment: 0 Active Inactive Necrotic Tissue Nursing Diagnoses: Impaired tissue integrity related to necrotic/devitalized tissue Goals: Necrotic/devitalized tissue will be minimized in the wound bed Date Initiated: 07/20/2019 Target Resolution Date: 08/19/2019 Goal Status: Active Interventions: Assess patient pain level pre-, during and post procedure and prior to discharge Treatment Activities: Apply topical anesthetic as ordered : 07/20/2019 Notes: Orientation to the Wound Care Program Nursing Diagnoses: Knowledge deficit related to the wound healing center program Goals: Patient/caregiver will verbalize understanding of the Wound Healing Center Program Date Initiated: 07/20/2019 Target Resolution Date: 08/19/2019 Goal Status: Active Interventions: Provide education on orientation to the wound center Notes: Pain, Acute or Chronic Nursing Diagnoses: Pain Management - Non-cyclic Acute (Procedural) Goals: Patient will verbalize adequate pain control and receive pain control interventions during procedures as needed Date Initiated: 07/20/2019 Target Resolution Date: 08/19/2019 Goal Status: Active Interventions: Assess comfort goal upon admission Notes: Venous Leg Ulcer Nursing Diagnoses: Knowledge deficit related to disease process and management Goals: Patient will maintain optimal edema control LIBAN, GUEDES ([id number]) Date Initiated:  07/20/2019 Target Resolution Date: 08/19/2019 Goal Status: Active Interventions: Assess peripheral edema status every visit. Compression as ordered Treatment Activities: Therapeutic compression applied : 07/20/2019 Notes: Wound/Skin Impairment Nursing Diagnoses: Knowledge deficit related to ulceration/compromised skin integrity Goals: Ulcer/skin breakdown will have a volume reduction of 30% by week 4 Date Initiated: 07/20/2019 Target Resolution Date: 08/19/2019 Goal Status: Active Interventions: Assess patient/caregiver ability to obtain necessary supplies Provide education on ulcer and skin care Treatment Activities: Skin care regimen initiated : 07/20/2019 Notes: Electronic Signature(s) Signed: 07/20/2019 3:55:21 PM By: Elliot Gurney, BSN, RN, CWS, Kim RN, BSN Entered By: Elliot Gurney, BSN, RN, CWS, Kim on 07/20/2019 13:26:21 Stemm, Jorje Guild ([id number]) -------------------------------------------------------------------------------- Pain Assessment Details Patient Name: [name] Date of Service: 07/20/2019 12:45 PM Medical Record Number: [id number] Patient Account Number: [id number] Date of Birth/Sex: 1945-12-28 (74 y.o. M) Treating RN: Curtis Sites Primary Care Temple Sporer: Etheleen Nicks Other Clinician: Referring Chen Saadeh: Referral, Self Treating Amour Cutrone/Extender: Altamese [place] in Treatment: 0 Active Problems Location of Pain Severity and Description of Pain Patient Has Paino No Site Locations Pain Management and Medication Current Pain Management: Electronic Signature(s) Signed: 07/20/2019 3:45:40 PM By: Curtis Sites Entered By: Curtis Sites on 07/20/2019 13:02:44 Schaafsma, Jorje Guild ([id number]) -------------------------------------------------------------------------------- Patient/Caregiver Education Details Patient Name: [name],  Makoto R. Date of Service: 07/20/2019 12:45 PM Medical Record Number: [id number] Patient Account Number: [id number] Date of Birth/Gender:  07-Oct-1945 (74 y.o. M) Treating RN: Huel Coventry Primary Care Physician: Etheleen Nicks Other Clinician: Referring Physician: Referral, Self Treating Physician/Extender: Altamese [place] in Treatment: 0 Education Assessment Education Provided To: Patient Education Topics Provided Venous: Handouts: Controlling Swelling with Compression Stockings Methods: Demonstration, Explain/Verbal Responses: State content correctly Wound Debridement: Handouts: Wound Debridement Methods: Demonstration, Explain/Verbal Responses: State content correctly Wound/Skin Impairment: Handouts: Caring for Your Ulcer Methods: Demonstration, Explain/Verbal Responses: State content correctly Electronic Signature(s) Signed: 07/20/2019 3:55:21 PM By: Elliot Gurney, BSN, RN, CWS, Kim RN, BSN Entered By: Elliot Gurney, BSN, RN, CWS, Kim on 07/20/2019 13:32:44 Betts, Jorje Guild ([id number]) -------------------------------------------------------------------------------- Wound Assessment Details Patient Name: [name] Date of Service: 07/20/2019 12:45 PM Medical Record Number: [id number] Patient Account Number: [id number] Date of Birth/Sex: 07-Sep-1945 (74 y.o. M) Treating RN: Curtis Sites Primary Care Adaora Mchaney: Etheleen Nicks Other Clinician: Referring Anjelina Dung: Referral, Self Treating Wednesday Ericsson/Extender: Altamese [place] in Treatment: 0 Wound Status Wound Number: 7 Primary Etiology: Venous Leg Ulcer Wound Location: Right, Lateral Ankle Wound Status: Open Wounding Event: Blister Date Acquired: 06/20/2019 Weeks Of Treatment: 0 Clustered Wound: No Photos Wound Measurements Length: (cm) 2.6 Width: (cm) 2.6 Depth: (cm) 0.2 Area: (cm) 5.309 Volume: (cm) 1.062 % Reduction in Area: % Reduction in Volume: Epithelialization: None Tunneling: No Undermining: No Wound Description Classification: Full Thickness Without Exposed Support Structu Wound Margin: Flat and Intact Exudate Amount: Medium Exudate  Type: Serous Exudate Color: amber res Foul Odor After Cleansing: No Slough/Fibrino Yes Wound Bed Granulation Amount: Medium (34-66%) Exposed Structure Granulation Quality: Pink Fascia Exposed: No Necrotic Amount: Medium (34-66%) Fat Layer (Subcutaneous Tissue) Exposed: Yes Necrotic Quality: Adherent Slough Tendon Exposed: No Muscle Exposed: No Joint Exposed: No Bone Exposed: No Treatment Notes Wound #7 (Right, Lateral Ankle) 1. Cleansed with: Cleanse wound with antibacterial soap and water May shower with protection 2. Anesthetic Topical Lidocaine 4% cream to wound bed prior to debridement Prickett, Arif R. ([id number]) 4. Dressing Applied: Iodoflex 5. Secondary Dressing Applied ABD Pad 7. Secured with 3 Layer Compression System - Right Lower Extremity Patient to wear own compression stockings Electronic Signature(s) Signed: 07/20/2019 3:45:40 PM By: Curtis Sites Entered By: Curtis Sites on 07/20/2019 13:08:13 Jungbluth, Jorje Guild ([id number]) -------------------------------------------------------------------------------- Vitals Details Patient Name: [name] Date of Service: 07/20/2019 12:45 PM Medical Record Number: [id number] Patient Account Number: [id number] Date of Birth/Sex: November 03, 1945 (73 y.o. M) Treating RN: Curtis Sites Primary Care Tanish Sinkler: Etheleen Nicks Other Clinician: Referring Robi Dewolfe: Referral, Self Treating Herberth Deharo/Extender: Altamese [place] in Treatment: 0 Vital Signs Time Taken: 13:03 Temperature (F): 98.2 Height (in): 69 Pulse (bpm): 56 Source: Measured Respiratory Rate (breaths/min): 18 Weight (lbs): 320 Blood Pressure (mmHg): 131/61 Source: Measured Reference Range: 80 - 120 mg / dl Body Mass Index (BMI): 47.3 Electronic Signature(s) Signed: 07/20/2019 3:45:40 PM By: Curtis Sites Entered By: Curtis Sites on 07/20/2019 13:03:29

## 2019-07-20 NOTE — Progress Notes (Signed)
CARVIN, ALMAS ([id number]) Visit Report for 07/20/2019 Chief Complaint Document Details Patient Name: [name], [name]. Date of Service: 07/20/2019 12:45 PM Medical Record Number: [id number] Patient Account Number: [id number] Date of Birth/Sex: 1945-02-06 (74 y.o. M) Treating RN: Huel Coventry Primary Care Provider: Etheleen Nicks Other Clinician: Referring Provider: Referral, Self Treating Provider/Extender: Altamese [place] in Treatment: 0 Information Obtained from: Patient Chief Complaint Right lateral ankle ulcer 07/20/2019; patient is here for a review of the wound on the right lateral ankle Electronic Signature(s) Signed: 07/20/2019 3:45:53 PM By: Baltazar Najjar MD Entered By: Baltazar Najjar on 07/20/2019 13:35:00 Brandes, Jorje Guild ([id number]) -------------------------------------------------------------------------------- Debridement Details Patient Name: [name] Date of Service: 07/20/2019 12:45 PM Medical Record Number: [id number] Patient Account Number: [id number] Date of Birth/Sex: 09-15-1945 (74 y.o. M) Treating RN: Huel Coventry Primary Care Provider: Etheleen Nicks Other Clinician: Referring Provider: Referral, Self Treating Provider/Extender: Altamese [place] in Treatment: 0 Debridement Performed for Wound #7 Right,Lateral Ankle Assessment: Performed By: Physician Maxwell Caul, MD Debridement Type: Debridement Severity of Tissue Pre Debridement: Fat layer exposed Level of Consciousness (Pre- Awake and Alert procedure): Pre-procedure Verification/Time Out Yes - 13:22 Taken: Pain Control: Lidocaine Total Area Debrided (L x W): 2.6 (cm) x 2.6 (cm) = 6.76 (cm) Tissue and other material Viable, Non-Viable, Slough, Subcutaneous, Slough debrided: Level: Skin/Subcutaneous Tissue Debridement Description: Excisional Instrument: Curette Bleeding: Moderate Hemostasis Achieved: Silver Nitrate Response to Treatment: Procedure was tolerated  well Level of Consciousness (Post- Awake and Alert procedure): Post Debridement Measurements of Total Wound Length: (cm) 2.6 Width: (cm) 2.6 Depth: (cm) 0.3 Volume: (cm) 1.593 Character of Wound/Ulcer Post Debridement: Stable Severity of Tissue Post Debridement: Fat layer exposed Post Procedure Diagnosis Same as Pre-procedure Electronic Signature(s) Signed: 07/20/2019 3:45:53 PM By: Baltazar Najjar MD Signed: 07/20/2019 3:55:21 PM By: Elliot Gurney, BSN, RN, CWS, Kim RN, BSN Entered By: Baltazar Najjar on 07/20/2019 13:34:33 Huard, Jorje Guild ([id number]) -------------------------------------------------------------------------------- HPI Details Patient Name: [name]. Date of Service: 07/20/2019 12:45 PM Medical Record Number: [id number] Patient Account Number: [id number] Date of Birth/Sex: 04-07-1945 (74 y.o. M) Treating RN: Huel Coventry Primary Care Provider: Etheleen Nicks Other Clinician: Referring Provider: Referral, Self Treating Provider/Extender: Altamese  in Treatment: 0 History of Present Illness HPI Description: As noted above the patient had a injury due to working in the yard and this rapidly progressed to a ulcerated area with an infection. In October 2014 he did have some vascular studies done but never saw a Careers adviser and never had any surgery for varicose veins. His past medical history is significant for hypertension, obstructive sleep apnea, morbid obesity, stasis dermatitis of both lower extremities and his past surgical history is suggestive of hip surgery on the right, gastric bypass surgery, cholecystectomy, hip replacement. He has recently been put on Levaquin 500 milligrams daily, Bactrim DS 1 twice a day and Bactroban 2% topical ointment locally. No recent labs or vascular or imaging studies done. 07/11/2014 a venous duplex study was done on 07/05/2014 -- it showed no evidence of deep vein thrombosis or superficial thrombosis bilaterally. There was  incompetence of the greater saphenous veins bilaterally and incompetence of the right small saphenous vein. The patient has an appointment to see the vascular surgeons on June 20. 07/18/2014 his appointment with the vascular surgeons is rescheduled for this afternoon. 07/25/2014 -- he saw the vascular surgeon and is going to have a endovenous procedure done on July 29. 08/01/2014 -- he was diagnosed with a UTI and is  on ciprofloxacin for 10 days. readmission: 11/27/15 On evaluation today patient is having discomfort in the right ankle region following an open wound from having dropped a cardboard box striking his ankle. He tells me that this has not healed despite many of the measures that he learned from previous injuries when he was seen at our wound center. Therefore he didn't come in for reevaluation today. He has not noted any significant purulent discharge at this point in time. 12/04/15 patient appears to be doing somewhat better at this point in time today in regard to his ankle wound. it is measuring smaller and looking cleaner. 12/11/15; patient wound continues to look improve this is on the right lateral ankle. Debrided of surface slough and nonviable subcutaneous tissue. Using silver alginate 12-18-15 Mr. Bialy presents today with his wife for follow-up on right lower extremity venous ulcer. He denies any changes or complications over the past week. He is tolerating compression therapy to his right lower extremity and has a properly fitting compression hose to the left lower extremity. 12/25/15; patient with a wound on his right lateral lower extremity in the setting of severe venous insufficiency [originally trauma against the box]. His edema is well controlled he is using Prisma. 01/01/16; continued improvement using Prisma. He has severe venous insufficiency however the wound was originally trauma against a box. 01/08/16; his wound is totally epithelialized. We will give him  measurements for graded pressure stockings which I think he is going to get at elastic therapy in [facility] Readmission: 10/30/17 patient presents today for reevaluation in our clinic although it has been since 2017 that we've last seen him. He has a ulcer on the right lateral ankle which he tells me began roughly 2 months ago. He states that he had one similar on the left ankle although this completely healed without any complication. However the one on the right as continue to give him a lot of trouble and is not healing appropriately. He really was not sure what the best thing to put on the area would be and therefore he's just been putting advantage to cover it. Another treatment has been utilized at this time. Fortunately there is not appear to be any evidence of significant infection necessarily although there is some evidence of abnormal discoloration in regard to the base of the wound that does not look as healthy as I was on the lives life. He does have erythema surrounding but I'm not sure if this is just due to chronic venous stasis/lymphedema or if it truly is anything infection wise going on at this point. Nonetheless in general the patient seems to be doing fairly well he's not having too much pain although he does have some discomfort. No fevers, chills, nausea, or vomiting noted at this time. 11/13/17 on evaluation today patient actually appears to be doing much better in regard to his wound currently. There does seem to be evidence of good improvement there still a lot of maceration and some necrotic tissue on the surface of the wound but this was cleaned away very well today. Overall I'm very pleased with how things seem to be progressing in general. We did get the results of his culture back which showed that he did have evidence of Staphylococcus aureus as well as group B strep. It does appear that the Bactrim was sensitive for both. The wound again does look  better. 11/20/17 on evaluation today patient's right lateral ankle ulcer actually appears to be doing excellent  at this time. He has been tolerating the dressing changes without complication. With that being said overall I do feel like that he has made great progress in the few weeks we've been seeing him at this point. No fevers, chills, nausea, or vomiting noted at this time. 11/30/17 on evaluation today patient appears to be doing extremely well at this point in regard to his right lateral ankle ulcer. He has been tolerating the dressing changes without complication. In general I've been very pleased with the overall progress. His wound overview chart shows that he is making steady progress towards closure. 12/07/17 on evaluation today patient actually appears to be doing well in regard to his ulcer. He does have somewhat of an irregular heart rate noted at this point this was confirmed by manual checks as well. However it was not as low as the initial reading by the machine which was around 48 he was closer to around 58-62. Nonetheless it does sound as if you may have a little bit of an irregularity possibly a field although it was somewhat slow and difficult to gauge the exact rhythm. Otherwise his wound itself seems to be showing signs of great improvement with Kushnir, Luster R. ([id number]) good epithelialization. He's tolerating the Iodoflex well. 12/14/17 on evaluation today patient continues to show signs of great improvement in my pinion. Overall I'm very pleased with the progress that is made. With that being said though he has shown improvement he tells me that for the first couple of days after we put his wrap on that he has severe pain for pretty much that entire amount of time. Nonetheless at this time I'm not seeing any evidence of infection which is good news. I think this may actually be coming from the actual dressing itself. That is the Iodoflex. 12/22/17 on evaluation today  patient actually appears to be doing much better in regard to his ulcer. He's been shown signs of improvement which is great news. Fortunately there does not appear to be any evidence of infection at this time. Overall I feel like he has shown excellent progress even since just last week. 12/29/17 on evaluation today patient actually appears to be doing excellent although he does have some of the dressing material stuck to the wound bed. Fortunately there does not appear to be any signs of infection which is great news. Overall I'm very pleased with the progression of his wound. 01/05/18 on evaluation today patient actually appears to be doing rather well in regard to his ankle ulcer. He has been tolerating the dressing changes without complication. Overall very pleased. He seems to be very close to healing. 01/14/18 on evaluation today patient actually appears to be doing excellent in regard to his right lateral lower extremity wound. He's been tolerating the dressing changes without complication. Fortunately he appears to be completely healed today. READMISSION 07/20/2019 This is a now 74 year old man that we have had on in this clinic on multiple occasions usually with a wound on the right lateral ankle in the setting of chronic venous hypertension. Looking at the notes from 2016 he had venous reflux studies saw vascular surgery and was supposed to have a procedure but I cannot see what procedure that was. He was last here in 2019 at which time he had an ulcer in this same area. He often describes minor trauma. On this occasion he simply saw 2 blisters one of the blisters opened and healed the other resulted in this wound he has not  been using his juxta lite stockings in fact according to his wife he use these for perhaps 2 or 3 months after he was last here but has not used them since. He does keep his legs elevated. He is a minimal ambulator walking with crutches secondary to left hip  issues. Past medical history is reviewed he has hypertension, obstructive sleep apnea on CPAP and O2, stasis dermatitis, hip history of a hip surgery, venous insufficiency with lymphedema. Urinary retention requiring a suprapubic catheter, panniculitis, status post Roux-en-Y gastric bypass ABIs in our clinic were 1.22 on the right and 1.17 on the left Electronic Signature(s) Signed: 07/20/2019 3:45:53 PM By: Baltazar Najjar MD Entered By: Baltazar Najjar on 07/20/2019 13:38:28 Liller, Jorje Guild ([id number]) -------------------------------------------------------------------------------- Physical Exam Details Patient Name: [name] Date of Service: 07/20/2019 12:45 PM Medical Record Number: [id number] Patient Account Number: [id number] Date of Birth/Sex: 07-29-1945 (74 y.o. M) Treating RN: Huel Coventry Primary Care Provider: Etheleen Nicks Other Clinician: Referring Provider: Referral, Self Treating Provider/Extender: Altamese [place] in Treatment: 0 Constitutional Sitting or standing Blood Pressure is within target range for patient.. Pulse regular and within target range for patient.Marland Kitchen Respirations regular, non- labored and within target range.. Temperature is normal and within the target range for the patient.Marland Kitchen appears in no distress. Respiratory Respiratory effort is easy and symmetric bilaterally. Rate is normal at rest and on room air.. Bilateral breath sounds are clear and equal in all lobes with no wheezes, rales or rhonchi.. Cardiovascular Heart rhythm and rate regular, without murmur or gallop. No signs of CHF. Popliteal pulses palpable on the right. Pedal pulses are palpable at both the dorsalis pedis and posterior tibial. Integumentary (Hair, Skin) Significant hemosiderin deposition anteriorly in both lower legs extending into the ankles. Probably some degree of dermal fibrosis. Psychiatric No evidence of depression, anxiety, or agitation. Calm, cooperative, and  communicative. Appropriate interactions and affect.. Notes Wound exam; hearing questions on the right medial ankle just below the right lateral ankle just below the lateral malleolus. Some depth to this. Raised senescent edges adherent debris on the wound surface. I used a #5 curette curette to remove this including the senescent edges. Hemostasis with silver nitrate Electronic Signature(s) Signed: 07/20/2019 3:45:53 PM By: Baltazar Najjar MD Entered By: Baltazar Najjar on 07/20/2019 13:41:02 Zellmer, Jorje Guild ([id number]) -------------------------------------------------------------------------------- Physician Orders Details Patient Name: [name]. Date of Service: 07/20/2019 12:45 PM Medical Record Number: [id number] Patient Account Number: [id number] Date of Birth/Sex: [birth year] (74 y.o. M) Treating RN: Huel Coventry Primary Care Provider: Etheleen Nicks Other Clinician: Referring Provider: Referral, Self Treating Provider/Extender: Altamese [place] in Treatment: 0 Verbal / Phone Orders: No Diagnosis Coding Wound Cleansing Wound #7 Right,Lateral Ankle o Cleanse wound with mild soap and water o May Shower, gently pat wound dry prior to applying new dressing. Anesthetic (add to Medication List) Wound #7 Right,Lateral Ankle o Topical Lidocaine 4% cream applied to wound bed prior to debridement (In Clinic Only). Primary Wound Dressing Wound #7 Right,Lateral Ankle o Iodoflex Secondary Dressing Wound #7 Right,Lateral Ankle o ABD pad Dressing Change Frequency Wound #7 Right,Lateral Ankle o Change dressing every week Follow-up Appointments Wound #7 Right,Lateral Ankle o Return Appointment in 1 week. o Nurse Visit as needed Edema Control Wound #7 Right,Lateral Ankle o 3 Layer Compression System - Right Lower Extremity o Patient to wear own Velcro compression garment. - left o Elevate legs to the level of the heart and pump ankles as often as  possible Additional Orders /  Instructions Wound #7 Right,Lateral Ankle o Increase protein intake. o Activity as tolerated Electronic Signature(s) Signed: 07/20/2019 3:45:53 PM By: Baltazar Najjar MD Signed: 07/20/2019 3:55:21 PM By: Elliot Gurney, BSN, RN, CWS, Kim RN, BSN Entered By: Elliot Gurney, BSN, RN, CWS, Kim on 07/20/2019 13:31:33 Lupercio, Jorje Guild ([id number]) -------------------------------------------------------------------------------- Problem List Details Patient Name: STALEY, LUNZ. Date of Service: 07/20/2019 12:45 PM Medical Record Number: [id number] Patient Account Number: [id number] Date of Birth/Sex: March 06, 1945 (74 y.o. M) Treating RN: Huel Coventry Primary Care Provider: Etheleen Nicks Other Clinician: Referring Provider: Referral, Self Treating Provider/Extender: Altamese [place] in Treatment: 0 Active Problems ICD-10 Encounter Code Description Active Date MDM Diagnosis I87.331 Chronic venous hypertension (idiopathic) with ulcer and inflammation of 07/20/2019 No Yes right lower extremity L97.312 Non-pressure chronic ulcer of right ankle with fat layer exposed 07/20/2019 No Yes Inactive Problems Resolved Problems Electronic Signature(s) Signed: 07/20/2019 3:45:53 PM By: Baltazar Najjar MD Entered By: Baltazar Najjar on 07/20/2019 13:34:04 Fearn, Mihran R. ([id number]) -------------------------------------------------------------------------------- Progress Note Details Patient Name: [name]. Date of Service: 07/20/2019 12:45 PM Medical Record Number: [id number] Patient Account Number: [id number] Date of Birth/Sex: [birth year] (74 y.o. M) Treating RN: Huel Coventry Primary Care Provider: Etheleen Nicks Other Clinician: Referring Provider: Referral, Self Treating Provider/Extender: Altamese [place] in Treatment: 0 Subjective Chief Complaint Information obtained from Patient Right lateral ankle ulcer 07/20/2019; patient is here for a review of the wound on  the right lateral ankle History of Present Illness (HPI) As noted above the patient had a injury due to working in the yard and this rapidly progressed to a ulcerated area with an infection. In October 2014 he did have some vascular studies done but never saw a Careers adviser and never had any surgery for varicose veins. His past medical history is significant for hypertension, obstructive sleep apnea, morbid obesity, stasis dermatitis of both lower extremities and his past surgical history is suggestive of hip surgery on the right, gastric bypass surgery, cholecystectomy, hip replacement. He has recently been put on Levaquin 500 milligrams daily, Bactrim DS 1 twice a day and Bactroban 2% topical ointment locally. No recent labs or vascular or imaging studies done. 07/11/2014 a venous duplex study was done on 07/05/2014 -- it showed no evidence of deep vein thrombosis or superficial thrombosis bilaterally. 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 The patient has an appointment to see the vascular surgeons on June 20. 07/18/2014 his appointment with the vascular surgeons is rescheduled for this afternoon. 07/25/2014 -- he saw the vascular surgeon and is going to have a endovenous procedure done on July 29. 08/01/2014 -- he was diagnosed with a UTI and is on ciprofloxacin for 10 days. readmission: 11/27/15 On evaluation today patient is having discomfort in the right ankle region following an open wound from having dropped a cardboard box striking his ankle. He tells me that this has not healed despite many of the measures that he learned from previous injuries when he was seen at our wound center. Therefore he didn't come in for reevaluation today. He has not noted any significant purulent discharge at this point in time. 12/04/15 patient appears to be doing somewhat better at this point in time today in regard to his ankle wound.  it is measuring smaller and looking cleaner. 12/11/15; patient wound continues to look improve this is on the right lateral ankle. Debrided of surface slough and nonviable subcutaneous tissue. Using silver alginate 12-18-15 Mr. Matich  presents today with his wife for follow-up on right lower extremity venous ulcer. He denies any changes or complications over the past week. He is tolerating compression therapy to his right lower extremity and has a properly fitting compression hose to the left lower extremity. 12/25/15; patient with a wound on his right lateral lower extremity in the setting of severe venous insufficiency [originally trauma against the box]. His edema is well controlled he is using Prisma. 01/01/16; continued improvement using Prisma. He has severe venous insufficiency however the wound was originally trauma against a box. 01/08/16; his wound is totally epithelialized. We will give him measurements for graded pressure stockings which I think he is going to get at elastic therapy in [facility] Readmission: 10/30/17 patient presents today for reevaluation in our clinic although it has been since 2017 that we've last seen him. He has a ulcer on the right lateral ankle which he tells me began roughly 2 months ago. He states that he had one similar on the left ankle although this completely healed without any complication. However the one on the right as continue to give him a lot of trouble and is not healing appropriately. He really was not sure what the best thing to put on the area would be and therefore he's just been putting advantage to cover it. Another treatment has been utilized at this time. Fortunately there is not appear to be any evidence of significant infection necessarily although there is some evidence of abnormal discoloration in regard to the base of the wound that does not look as healthy as I was on the lives life. He does have erythema surrounding but  I'm not sure if this is just due to chronic venous stasis/lymphedema or if it truly is anything infection wise going on at this point. Nonetheless in general the patient seems to be doing fairly well he's not having too much pain although he does have some discomfort. No fevers, chills, nausea, or vomiting noted at this time. 11/13/17 on evaluation today patient actually appears to be doing much better in regard to his wound currently. There does seem to be evidence of good improvement there still a lot of maceration and some necrotic tissue on the surface of the wound but this was cleaned away very well today. Overall I'm very pleased with how things seem to be progressing in general. We did get the results of his culture back which showed that he did have evidence of Staphylococcus aureus as well as group B strep. It does appear that the Bactrim was sensitive for both. The wound again does look better. 11/20/17 on evaluation today patient's right lateral ankle ulcer actually appears to be doing excellent at this time. He has been tolerating the dressing changes without complication. With that being said overall I do feel like that he has made great progress in the few weeks we've been seeing him at this point. No fevers, chills, nausea, or vomiting noted at this time. 11/30/17 on evaluation today patient appears to be doing extremely well at this point in regard to his right lateral ankle ulcer. He has been tolerating the dressing changes without complication. In general I've been very pleased with the overall progress. His wound overview chart shows that he is making steady progress towards closure. AGAM, DAVENPORT ([id number]) 12/07/17 on evaluation today patient actually appears to be doing well in regard to his ulcer. He does have somewhat of an irregular heart rate noted at this point this  was confirmed by manual checks as well. However it was not as low as the initial reading by the machine  which was around 48 he was closer to around 58-62. Nonetheless it does sound as if you may have a little bit of an irregularity possibly a field although it was somewhat slow and difficult to gauge the exact rhythm. Otherwise his wound itself seems to be showing signs of great improvement with good epithelialization. He's tolerating the Iodoflex well. 12/14/17 on evaluation today patient continues to show signs of great improvement in my pinion. Overall I'm very pleased with the progress that is made. With that being said though he has shown improvement he tells me that for the first couple of days after we put his wrap on that he has severe pain for pretty much that entire amount of time. Nonetheless at this time I'm not seeing any evidence of infection which is good news. I think this may actually be coming from the actual dressing itself. That is the Iodoflex. 12/22/17 on evaluation today patient actually appears to be doing much better in regard to his ulcer. He's been shown signs of improvement which is great news. Fortunately there does not appear to be any evidence of infection at this time. Overall I feel like he has shown excellent progress even since just last week. 12/29/17 on evaluation today patient actually appears to be doing excellent although he does have some of the dressing material stuck to the wound bed. Fortunately there does not appear to be any signs of infection which is great news. Overall I'm very pleased with the progression of his wound. 01/05/18 on evaluation today patient actually appears to be doing rather well in regard to his ankle ulcer. He has been tolerating the dressing changes without complication. Overall very pleased. He seems to be very close to healing. 01/14/18 on evaluation today patient actually appears to be doing excellent in regard to his right lateral lower extremity wound. He's been tolerating the dressing changes without complication. Fortunately he  appears to be completely healed today. READMISSION 07/20/2019 This is a now 74 year old man that we have had on in this clinic on multiple occasions usually with a wound on the right lateral ankle in the setting of chronic venous hypertension. Looking at the notes from 2016 he had venous reflux studies saw vascular surgery and was supposed to have a procedure but I cannot see what procedure that was. He was last here in 2019 at which time he had an ulcer in this same area. He often describes minor trauma. On this occasion he simply saw 2 blisters one of the blisters opened and healed the other resulted in this wound he has not been using his juxta lite stockings in fact according to his wife he use these for perhaps 2 or 3 months after he was last here but has not used them since. He does keep his legs elevated. He is a minimal ambulator walking with crutches secondary to left hip issues. Past medical history is reviewed he has hypertension, obstructive sleep apnea on CPAP and O2, stasis dermatitis, hip history of a hip surgery, venous insufficiency with lymphedema. Urinary retention requiring a suprapubic catheter, panniculitis, status post Roux-en-Y gastric bypass ABIs in our clinic were 1.22 on the right and 1.17 on the left Patient History Information obtained from Patient. Allergies aspirin Family History Cancer - Mother, Diabetes - Mother, Heart Disease - Maternal Grandparents, Hypertension - Maternal Grandparents, No family history of Hereditary Spherocytosis,  Kidney Disease, Lung Disease, Seizures, Stroke, Thyroid Problems, Tuberculosis. Social History Former smoker - 30+ years, Marital Status - Married, Alcohol Use - Rarely, Drug Use - No History, Caffeine Use - Moderate. Medical History Eyes Denies history of Cataracts, Glaucoma, Optic Neuritis Ear/Nose/Mouth/Throat Denies history of Chronic sinus problems/congestion, Middle ear problems Hematologic/Lymphatic Patient has history  of Anemia - iron pills Denies history of Hemophilia, Human Immunodeficiency Virus, Lymphedema, Sickle Cell Disease Respiratory Patient has history of Sleep Apnea - C-pap and oxygen 1.5L at night Denies history of Aspiration, Asthma, Chronic Obstructive Pulmonary Disease (COPD), Pneumothorax, Tuberculosis Cardiovascular Patient has history of Hypertension Denies history of Angina, Arrhythmia, Congestive Heart Failure, Coronary Artery Disease, Deep Vein Thrombosis, Hypotension, Myocardial Infarction, Peripheral Arterial Disease, Peripheral Venous Disease, Phlebitis, Vasculitis Gastrointestinal Denies history of Cirrhosis , Colitis, Crohn s, Hepatitis A, Hepatitis B, Hepatitis C Endocrine Denies history of Type I Diabetes, Type II Diabetes Genitourinary Denies history of End Stage Renal Disease Immunological Denies history of Lupus Erythematosus, Raynaud s, Scleroderma Integumentary (Skin) Denies history of History of Burn, History of pressure wounds Newmann, Tidus R. ([id number]) Musculoskeletal Patient has history of Osteoarthritis - left hip Denies history of Gout, Rheumatoid Arthritis, Osteomyelitis Neurologic Denies history of Dementia, Neuropathy, Quadriplegia, Paraplegia, Seizure Disorder Oncologic Denies history of Received Chemotherapy, Received Radiation Psychiatric Denies history of Anorexia/bulimia, Confinement Anxiety Hospitalization/Surgery History - cellulitis. - cellulitis. - hip replacement. - cellulitis. Medical And Surgical History Notes Genitourinary suprapubic catheter Musculoskeletal has artificial right hip and right shoulder Review of Systems (ROS) Integumentary (Skin) Complains or has symptoms of Wounds, Swelling. Denies complaints or symptoms of Bleeding or bruising tendency, Breakdown. Objective Constitutional Sitting or standing Blood Pressure is within target range for patient.. Pulse regular and within target range for patient.Marland Kitchen Respirations regular,  non- labored and within target range.. Temperature is normal and within the target range for the patient.Marland Kitchen appears in no distress. Vitals Time Taken: 1:03 PM, Height: 69 in, Source: Measured, Weight: 320 lbs, Source: Measured, BMI: 47.3, Temperature: 98.2 F, Pulse: 56 bpm, Respiratory Rate: 18 breaths/min, Blood Pressure: 131/61 mmHg. Respiratory Respiratory effort is easy and symmetric bilaterally. Rate is normal at rest and on room air.. Bilateral breath sounds are clear and equal in all lobes with no wheezes, rales or rhonchi.. Cardiovascular Heart rhythm and rate regular, without murmur or gallop. No signs of CHF. Popliteal pulses palpable on the right. Pedal pulses are palpable at both the dorsalis pedis and posterior tibial. Psychiatric No evidence of depression, anxiety, or agitation. Calm, cooperative, and communicative. Appropriate interactions and affect.. General Notes: Wound exam; hearing questions on the right medial ankle just below the right lateral ankle just below the lateral malleolus. Some depth to this. Raised senescent edges adherent debris on the wound surface. I used a #5 curette curette to remove this including the senescent edges. Hemostasis with silver nitrate Integumentary (Hair, Skin) Significant hemosiderin deposition anteriorly in both lower legs extending into the ankles. Probably some degree of dermal fibrosis. Wound #7 status is Open. Original cause of wound was Blister. The wound is located on the Right,Lateral Ankle. The wound measures 2.6cm length x 2.6cm width x 0.2cm depth; 5.309cm^2 area and 1.062cm^3 volume. There is Fat Layer (Subcutaneous Tissue) Exposed exposed. There is no tunneling or undermining noted. There is a medium amount of serous drainage noted. The wound margin is flat and intact. There is medium (34-66%) pink granulation within the wound bed. There is a medium (34-66%) amount of necrotic tissue within the wound bed including  Adherent  Slough. Assessment Active Problems ICD-10 Chronic venous hypertension (idiopathic) with ulcer and inflammation of right lower extremity Non-pressure chronic ulcer of right ankle with fat layer exposed Mccubbin, Corbet R. ([id number]) Procedures Wound #7 Pre-procedure diagnosis of Wound #7 is a Venous Leg Ulcer located on the Right,Lateral Ankle .Severity of Tissue Pre Debridement is: Fat layer exposed. There was a Excisional Skin/Subcutaneous Tissue Debridement with a total area of 6.76 sq cm performed by Maxwell Caul, MD. With the following instrument(s): Curette to remove Viable and Non-Viable tissue/material. Material removed includes Subcutaneous Tissue and Slough and after achieving pain control using Lidocaine. No specimens were taken. A time out was conducted at 13:22, prior to the start of the procedure. A Moderate amount of bleeding was controlled with Silver Nitrate. The procedure was tolerated well. Post Debridement Measurements: 2.6cm length x 2.6cm width x 0.3cm depth; 1.593cm^3 volume. Character of Wound/Ulcer Post Debridement is stable. Severity of Tissue Post Debridement is: Fat layer exposed. Post procedure Diagnosis Wound #7: Same as Pre-Procedure Plan Wound Cleansing: Wound #7 Right,Lateral Ankle: Cleanse wound with mild soap and water May Shower, gently pat wound dry prior to applying new dressing. Anesthetic (add to Medication List): Wound #7 Right,Lateral Ankle: Topical Lidocaine 4% cream applied to wound bed prior to debridement (In Clinic Only). Primary Wound Dressing: Wound #7 Right,Lateral Ankle: Iodoflex Secondary Dressing: Wound #7 Right,Lateral Ankle: ABD pad Dressing Change Frequency: Wound #7 Right,Lateral Ankle: Change dressing every week Follow-up Appointments: Wound #7 Right,Lateral Ankle: Return Appointment in 1 week. Nurse Visit as needed Edema Control: Wound #7 Right,Lateral Ankle: 3 Layer Compression System - Right Lower  Extremity Patient to wear own Velcro compression garment. - left Elevate legs to the level of the heart and pump ankles as often as possible Additional Orders / Instructions: Wound #7 Right,Lateral Ankle: Increase protein intake. Activity as tolerated #1 we put Iodoflex on the wound area with 3 layer compression on the right 2. He can use his own compression garment on the left 3. He may require additional debridements next week. 4. The patient had some form of procedure on the right leg by description in 2016. This was not just from the patient but from the notes and I heal during that time although I cannot really see what the procedure was. I will check this in epic. Spent 30 minutes in review of this patient's past medical history, face-to-face evaluation and preparation of this record Electronic Signature(s) Signed: 07/20/2019 3:45:53 PM By: Baltazar Najjar MD Entered By: Baltazar Najjar on 07/20/2019 13:42:48 Weidemann, Jorje Guild ([id number]) -------------------------------------------------------------------------------- ROS/PFSH Details Patient Name: [name]. Date of Service: 07/20/2019 12:45 PM Medical Record Number: [id number] Patient Account Number: [id number] Date of Birth/Sex: 05-Aug-1945 (74 y.o. M) Treating RN: Curtis Sites Primary Care Provider: Etheleen Nicks Other Clinician: Referring Provider: Referral, Self Treating Provider/Extender: Altamese [place] in Treatment: 0 Information Obtained From Patient Integumentary (Skin) Complaints and Symptoms: Positive for: Wounds; Swelling Negative for: Bleeding or bruising tendency; Breakdown Medical History: Negative for: History of Burn; History of pressure wounds Eyes Medical History: Negative for: Cataracts; Glaucoma; Optic Neuritis Ear/Nose/Mouth/Throat Medical History: Negative for: Chronic sinus problems/congestion; Middle ear problems Hematologic/Lymphatic Medical History: Positive for: Anemia - iron  pills Negative for: Hemophilia; Human Immunodeficiency Virus; Lymphedema; Sickle Cell Disease Respiratory Medical History: Positive for: Sleep Apnea - C-pap and oxygen 1.5L at night Negative for: Aspiration; Asthma; Chronic Obstructive Pulmonary Disease (COPD); Pneumothorax; Tuberculosis Cardiovascular Medical History: Positive for: Hypertension Negative for: Angina; Arrhythmia; Congestive  Heart Failure; Coronary Artery Disease; Deep Vein Thrombosis; Hypotension; Myocardial Infarction; Peripheral Arterial Disease; Peripheral Venous Disease; Phlebitis; Vasculitis Gastrointestinal Medical History: Negative for: Cirrhosis ; Colitis; Crohnos; Hepatitis A; Hepatitis B; Hepatitis C Endocrine Medical History: Negative for: Type I Diabetes; Type II Diabetes Genitourinary Medical History: Negative for: End Stage Renal Disease Past Medical History Notes: suprapubic catheter Immunological Tamez, Cha R. ([id number]) Medical History: Negative for: Lupus Erythematosus; Raynaudos; Scleroderma Musculoskeletal Medical History: Positive for: Osteoarthritis - left hip Negative for: Gout; Rheumatoid Arthritis; Osteomyelitis Past Medical History Notes: has artificial right hip and right shoulder Neurologic Medical History: Negative for: Dementia; Neuropathy; Quadriplegia; Paraplegia; Seizure Disorder Oncologic Medical History: Negative for: Received Chemotherapy; Received Radiation Psychiatric Medical History: Negative for: Anorexia/bulimia; Confinement Anxiety Immunizations Pneumococcal Vaccine: Received Pneumococcal Vaccination: No Tetanus Vaccine: Last tetanus shot: 06/28/2011 Implantable Devices No devices added Hospitalization / Surgery History Type of Hospitalization/Surgery cellulitis cellulitis hip replacement cellulitis Family and Social History Cancer: Yes - Mother; Diabetes: Yes - Mother; Heart Disease: Yes - Maternal Grandparents; Hereditary Spherocytosis: No;  Hypertension: Yes - Maternal Grandparents; Kidney Disease: No; Lung Disease: No; Seizures: No; Stroke: No; Thyroid Problems: No; Tuberculosis: No; Former smoker - 30+ years; Marital Status - Married; Alcohol Use: Rarely; Drug Use: No History; Caffeine Use: Moderate; Financial Concerns: No; Food, Clothing or Shelter Needs: No; Support System Lacking: No; Transportation Concerns: No Electronic Signature(s) Signed: 07/20/2019 3:45:40 PM By: Curtis Sitesorthy, Joanna Signed: 07/20/2019 3:45:53 PM By: Baltazar Najjarobson, Lorie Cleckley MD Entered By: Curtis Sitesorthy, Joanna on 07/20/2019 13:23:21 Carreiro, [name] ([id number]) -------------------------------------------------------------------------------- SuperBill Details Patient Name: [name] AlmaSHAFAR, Treylan R. Date of Service: 07/20/2019 Medical Record Number: [id number] Patient Account Number: [id number] Date of Birth/Sex: 04/06/1945 (74 y.o. M) Treating RN: Huel CoventryWoody, Kim Primary Care Provider: Etheleen NicksMACDONALD, KERI Other Clinician: Referring Provider: Referral, Self Treating Provider/Extender: Altamese CarolinaOBSON, Everard Interrante G Weeks in Treatment: 0 Diagnosis Coding ICD-10 Codes Code Description I87.331 Chronic venous hypertension (idiopathic) with ulcer and inflammation of right lower extremity L97.312 Non-pressure chronic ulcer of right ankle with fat layer exposed Facility Procedures CPT4 Code: 0865784676100138 Description: 99213 - WOUND CARE VISIT-LEV 3 EST PT Modifier: Quantity: 1 CPT4 Code: 9629528436100012 Description: 11042 - DEB SUBQ TISSUE 20 SQ CM/< Modifier: Quantity: 1 CPT4 Code: Description: ICD-10 Diagnosis Description L97.312 Non-pressure chronic ulcer of right ankle with fat layer exposed Modifier: Quantity: Physician Procedures CPT4 Code Description: 13244016770424 99214 - WC PHYS LEVEL 4 - EST PT Modifier: 25 Quantity: 1 CPT4 Code Description: ICD-10 Diagnosis Description I87.331 Chronic venous hypertension (idiopathic) with ulcer and inflammation of L97.312 Non-pressure chronic ulcer of right ankle with fat  layer exposed Modifier: right lower extremit Quantity: y CPT4 Code Description: [id number] 11042 - WC PHYS SUBQ TISS 20 SQ CM Modifier: Quantity: 1 CPT4 Code Description: ICD-10 Diagnosis Description L97.312 Non-pressure chronic ulcer of right ankle with fat layer exposed Modifier: Quantity: Electronic Signature(s) Signed: 07/20/2019 3:45:53 PM By: Baltazar Najjarobson, Kristina Bertone MD Entered By: Baltazar Najjarobson, Dylan Ruotolo on 07/20/2019 13:43:15

## 2019-07-20 NOTE — Progress Notes (Signed)
KEBRON, PULSE ([id number]) Visit Report for 07/20/2019 Abuse/Suicide Risk Screen Details Patient Name: [name], [name]. Date of Service: 07/20/2019 12:45 PM Medical Record Number: [id number] Patient Account Number: [id number] Date of Birth/Sex: [birth year] (74 y.o. M) Treating RN: Montey Hora Primary Care Trinita Devlin: Verita Lamb Other Clinician: Referring Deontez Klinke: Referral, Self Treating Gisele Pack/Extender: Tito Dine in Treatment: 0 Abuse/Suicide Risk Screen Items Answer ABUSE RISK SCREEN: Has anyone close to you tried to hurt or harm you recentlyo No Do you feel uncomfortable with anyone in your familyo No Has anyone forced you do things that you didnot want to doo No Electronic Signature(s) Signed: 07/20/2019 1:23:29 PM By: Montey Hora Entered By: Montey Hora on 07/20/2019 13:23:29 [name], [name] ([id number]) -------------------------------------------------------------------------------- Activities of Daily Living Details Patient Name: [name]. Date of Service: 07/20/2019 12:45 PM Medical Record Number: [id number] Patient Account Number: [id number] Date of Birth/Sex: 05-Oct-1945 (74 y.o. M) Treating RN: Montey Hora Primary Care Jermario Kalmar: Verita Lamb Other Clinician: Referring Ahliyah Nienow: Referral, Self Treating Climmie Buelow/Extender: Tito Dine in Treatment: 0 Activities of Daily Living Items Answer Activities of Daily Living (Please select one for each item) Drive Automobile Completely Able Take Medications Completely Able Use Telephone Completely Able Care for Appearance Completely Able Use Toilet Completely Able Bath / Shower Completely Able Dress Self Completely Able Feed Self Completely Able Walk Completely Able Get In / Out Bed Completely Able Housework Completely Able Prepare Meals Completely [place] for Self Completely Able Electronic Signature(s) Signed: 07/20/2019 1:23:57 PM By: Montey Hora Entered By: Montey Hora on 07/20/2019 13:23:57 [name], [name] ([id number]) -------------------------------------------------------------------------------- Education Screening Details Patient Name: [name]. Date of Service: 07/20/2019 12:45 PM Medical Record Number: [id number] Patient Account Number: [id number] Date of Birth/Sex: May 10, 1945 (74 y.o. M) Treating RN: Montey Hora Primary Care Veneta Sliter: Verita Lamb Other Clinician: Referring Jameisha Stofko: Referral, Self Treating Bliss Tsang/Extender: Tito Dine in Treatment: 0 Primary Learner Assessed: Patient Learning Preferences/Education Level/Primary Language Learning Preference: Explanation, Demonstration Highest Education Level: College or Above Preferred Language: English Cognitive Barrier Language Barrier: No Translator Needed: No Memory Deficit: No Emotional Barrier: No Cultural/Religious Beliefs Affecting Medical Care: No Physical Barrier Impaired Vision: No Impaired Hearing: No Decreased Hand dexterity: No Knowledge/Comprehension Knowledge Level: Medium Comprehension Level: Medium Ability to understand written instructions: Medium Ability to understand verbal instructions: Medium Motivation Anxiety Level: Calm Cooperation: Cooperative Education Importance: Acknowledges Need Interest in Health Problems: Asks Questions Perception: Coherent Willingness to Engage in Self-Management Medium Activities: Readiness to Engage in Self-Management Medium Activities: Electronic Signature(s) Signed: 07/20/2019 1:24:22 PM By: Montey Hora Entered By: Montey Hora on 07/20/2019 13:24:21 [name], [name] ([id number]) -------------------------------------------------------------------------------- Fall Risk Assessment Details Patient Name: [name]. Date of Service: 07/20/2019 12:45 PM Medical Record Number: [id number] Patient Account Number: [id number] Date of Birth/Sex: 1945/08/10 (74 y.o.  M) Treating RN: Montey Hora Primary Care Idalia Allbritton: Verita Lamb Other Clinician: Referring Brylynn Hanssen: Referral, Self Treating Luanne Krzyzanowski/Extender: Tito Dine in Treatment: 0 Fall Risk Assessment Items Have you had 2 or more falls in the last 12 monthso 0 No Have you had any fall that resulted in injury in the last 12 monthso 0 No FALLS RISK SCREEN History of falling - immediate or within 3 months 0 No Secondary diagnosis (Do you have 2 or more medical diagnoseso) 0 No Ambulatory aid None/bed rest/wheelchair/nurse 0 No Crutches/cane/walker 15 Yes Furniture 0 No Intravenous therapy Access/Saline/Heparin Lock 0 No Gait/Transferring Normal/ bed rest/ wheelchair 0 No Weak (short  steps with or without shuffle, stooped but able to lift head while walking, may 10 Yes seek support from furniture) Impaired (short steps with shuffle, may have difficulty arising from chair, head down, impaired 0 No balance) Mental Status Oriented to own ability 0 Yes Electronic Signature(s) Signed: 07/20/2019 1:24:49 PM By: Curtis Sites Entered By: Curtis Sites on 07/20/2019 13:24:49 [name], [name] ([id number]) -------------------------------------------------------------------------------- Foot Assessment Details Patient Name: [name] Pen R. Date of Service: 07/20/2019 12:45 PM Medical Record Number: [id number] Patient Account Number: [id number] Date of Birth/Sex: Nov 14, 1945 (74 y.o. M) Treating RN: Curtis Sites Primary Care Mellie Buccellato: Etheleen Nicks Other Clinician: Referring Amaris Garrette: Referral, Self Treating Betania Dizon/Extender: Altamese [place] in Treatment: 0 Foot Assessment Items Site Locations + = Sensation present, - = Sensation absent, C = Callus, U = Ulcer R = Redness, W = Warmth, M = Maceration, PU = Pre-ulcerative lesion F = Fissure, S = Swelling, D = Dryness Assessment Right: Left: Other Deformity: No No Prior Foot Ulcer: No No Prior Amputation: No  No Charcot Joint: No No Ambulatory Status: Ambulatory With Help Assistance Device: Crutches Gait: Steady Electronic Signature(s) Signed: 07/20/2019 1:25:08 PM By: Curtis Sites Entered By: Curtis Sites on 07/20/2019 13:25:07 [name], [name] ([id number]) -------------------------------------------------------------------------------- Nutrition Risk Screening Details Patient Name: [name] Pen R. Date of Service: 07/20/2019 12:45 PM Medical Record Number: [id number] Patient Account Number: [id number] Date of Birth/Sex: May 14, 1945 (74 y.o. M) Treating RN: Curtis Sites Primary Care Masaki Rothbauer: Etheleen Nicks Other Clinician: Referring Chenise Mulvihill: Referral, Self Treating Lizzie An/Extender: Altamese [place] in Treatment: 0 Height (in): 69 Weight (lbs): 320 Body Mass Index (BMI): 47.3 Nutrition Risk Screening Items Score Screening NUTRITION RISK SCREEN: I have an illness or condition that made me change the kind and/or amount of food I eat 0 No I eat fewer than two meals per day 0 No I eat few fruits and vegetables, or milk products 0 No I have three or more drinks of beer, liquor or wine almost every day 0 No I have tooth or mouth problems that make it hard for me to eat 0 No I don't always have enough money to buy the food I need 0 No I eat alone most of the time 0 No I take three or more different prescribed or over-the-counter drugs a day 1 Yes Without wanting to, I have lost or gained 10 pounds in the last six months 0 No I am not always physically able to shop, cook and/or feed myself 0 No Nutrition Protocols Good Risk Protocol 0 No interventions needed Moderate Risk Protocol High Risk Proctocol Risk Level: Good Risk Score: 1 Electronic Signature(s) Signed: 07/20/2019 1:24:58 PM By: Curtis Sites Entered By: Curtis Sites on 07/20/2019 13:24:57

## 2019-07-27 ENCOUNTER — Encounter: Payer: Medicare Other | Admitting: Internal Medicine

## 2019-07-27 ENCOUNTER — Other Ambulatory Visit: Payer: Self-pay

## 2019-07-27 DIAGNOSIS — L97312 Non-pressure chronic ulcer of right ankle with fat layer exposed: Secondary | ICD-10-CM | POA: Diagnosis not present

## 2019-08-02 ENCOUNTER — Encounter: Payer: Medicare Other | Attending: Physician Assistant | Admitting: Physician Assistant

## 2019-08-02 ENCOUNTER — Other Ambulatory Visit: Payer: Self-pay

## 2019-08-02 DIAGNOSIS — G4733 Obstructive sleep apnea (adult) (pediatric): Secondary | ICD-10-CM | POA: Insufficient documentation

## 2019-08-02 DIAGNOSIS — Z6841 Body Mass Index (BMI) 40.0 and over, adult: Secondary | ICD-10-CM | POA: Insufficient documentation

## 2019-08-02 DIAGNOSIS — Z886 Allergy status to analgesic agent status: Secondary | ICD-10-CM | POA: Diagnosis not present

## 2019-08-02 DIAGNOSIS — M1612 Unilateral primary osteoarthritis, left hip: Secondary | ICD-10-CM | POA: Diagnosis not present

## 2019-08-02 DIAGNOSIS — Z8249 Family history of ischemic heart disease and other diseases of the circulatory system: Secondary | ICD-10-CM | POA: Insufficient documentation

## 2019-08-02 DIAGNOSIS — I872 Venous insufficiency (chronic) (peripheral): Secondary | ICD-10-CM | POA: Diagnosis not present

## 2019-08-02 DIAGNOSIS — Z9049 Acquired absence of other specified parts of digestive tract: Secondary | ICD-10-CM | POA: Insufficient documentation

## 2019-08-02 DIAGNOSIS — M1990 Unspecified osteoarthritis, unspecified site: Secondary | ICD-10-CM | POA: Insufficient documentation

## 2019-08-02 DIAGNOSIS — I890 Lymphedema, not elsewhere classified: Secondary | ICD-10-CM | POA: Diagnosis not present

## 2019-08-02 DIAGNOSIS — I10 Essential (primary) hypertension: Secondary | ICD-10-CM | POA: Insufficient documentation

## 2019-08-02 DIAGNOSIS — Z9884 Bariatric surgery status: Secondary | ICD-10-CM | POA: Insufficient documentation

## 2019-08-02 DIAGNOSIS — Z833 Family history of diabetes mellitus: Secondary | ICD-10-CM | POA: Diagnosis not present

## 2019-08-02 DIAGNOSIS — L97312 Non-pressure chronic ulcer of right ankle with fat layer exposed: Secondary | ICD-10-CM | POA: Diagnosis present

## 2019-08-02 DIAGNOSIS — Z96649 Presence of unspecified artificial hip joint: Secondary | ICD-10-CM | POA: Diagnosis not present

## 2019-08-02 NOTE — Progress Notes (Addendum)
TAEVYN, HAUSEN ([id number]) Visit Report for 08/02/2019 Arrival Information Details Patient Name: SKYLUR, FUSTON. Date of Service: 08/02/2019 1:15 PM Medical Record Number: [id number] Patient Account Number: [id number] Date of Birth/Sex: 06-18-1945 (74 y.o. M) Treating RN: Rodell Perna Primary Care Tranisha Tissue: Etheleen Nicks Other Clinician: Referring Ruperto Kiernan: Etheleen Nicks Treating Hermie Reagor/Extender: Linwood Dibbles, HOYT Weeks in Treatment: 1 Visit Information History Since Last Visit Added or deleted any medications: No Patient Arrived: Crutches Any new allergies or adverse reactions: No Arrival Time: 13:24 Had a fall or experienced change in No Accompanied By: wife activities of daily living that may affect Transfer Assistance: None risk of falls: Patient Identification Verified: Yes Signs or symptoms of abuse/neglect since last visito No Hospitalized since last visit: No Has Dressing in Place as Prescribed: Yes Pain Present Now: No Electronic Signature(s) Signed: 08/04/2019 10:39:21 AM By: Rodell Perna Entered By: Rodell Perna on 08/02/2019 13:24:32 Ethier, Jorje Guild ([id number]) -------------------------------------------------------------------------------- Encounter Discharge Information Details Patient Name: [name] Date of Service: 08/02/2019 1:15 PM Medical Record Number: [id number] Patient Account Number: [id number] Date of Birth/Sex: [birth year] (74 y.o. M) Treating RN: Huel Coventry Primary Care Tamer Baughman: Etheleen Nicks Other Clinician: Referring Fadi Menter: Etheleen Nicks Treating Ishmeal Rorie/Extender: Linwood Dibbles, HOYT Weeks in Treatment: 1 Encounter Discharge Information Items Discharge Condition: Stable Ambulatory Status: Crutches Discharge Destination: Home Transportation: Private Auto Accompanied By: wife Schedule Follow-up Appointment: Yes Clinical Summary of Care: Electronic Signature(s) Signed: 08/02/2019 6:18:36 PM By: Elliot Gurney, BSN, RN, CWS, Kim RN, BSN Entered  By: Elliot Gurney, BSN, RN, CWS, Kim on 08/02/2019 13:59:51 Croft, Jorje Guild ([id number]) -------------------------------------------------------------------------------- Lower Extremity Assessment Details Patient Name: ZAKERY, NORMINGTON. Date of Service: 08/02/2019 1:15 PM Medical Record Number: [id number] Patient Account Number: [id number] Date of Birth/Sex: 10-07-1945 (74 y.o. M) Treating RN: Rodell Perna Primary Care Milayah Krell: Etheleen Nicks Other Clinician: Referring Soraida Vickers: Etheleen Nicks Treating Charleene Callegari/Extender: Linwood Dibbles, HOYT Weeks in Treatment: 1 Edema Assessment Assessed: [Left: No] [Right: No] Edema: [Left: N] [Right: o] Calf Left: Right: Point of Measurement: 34 cm From Medial Instep cm 42 cm Ankle Left: Right: Point of Measurement: 12 cm From Medial Instep cm 25 cm Vascular Assessment Pulses: Dorsalis Pedis Palpable: [Right:Yes] Electronic Signature(s) Signed: 08/04/2019 10:39:21 AM By: Rodell Perna Entered By: Rodell Perna on 08/02/2019 13:31:56 Nish, Ramesses R. ([id number]) -------------------------------------------------------------------------------- Multi Wound Chart Details Patient Name: [name] Date of Service: 08/02/2019 1:15 PM Medical Record Number: [id number] Patient Account Number: [id number] Date of Birth/Sex: [birth year] (74 y.o. M) Treating RN: Huel Coventry Primary Care Shaivi Rothschild: Etheleen Nicks Other Clinician: Referring Sanyiah Kanzler: Etheleen Nicks Treating Saintclair Schroader/Extender: Linwood Dibbles, HOYT Weeks in Treatment: 1 Vital Signs Height(in): 69 Pulse(bpm): 56 Weight(lbs): 320 Blood Pressure(mmHg): 139/71 Body Mass Index(BMI): 47 Temperature(F): 98.6 Respiratory Rate(breaths/min): 18 Photos: [N/A:N/A] Wound Location: Right, Lateral Ankle N/A N/A Wounding Event: Blister N/A N/A Primary Etiology: Venous Leg Ulcer N/A N/A Comorbid History: Anemia, Sleep Apnea, N/A N/A Hypertension, Osteoarthritis Date Acquired: 06/20/2019 N/A N/A Weeks of Treatment: 1  N/A N/A Wound Status: Open N/A N/A Measurements L x W x D (cm) 2.4x1.5x0.2 N/A N/A Area (cm) : 2.827 N/A N/A Volume (cm) : 0.565 N/A N/A % Reduction in Area: 46.80% N/A N/A % Reduction in Volume: 46.80% N/A N/A Classification: Full Thickness Without Exposed N/A N/A Support Structures Exudate Amount: Medium N/A N/A Exudate Type: Serous N/A N/A Exudate Color: amber N/A N/A Wound Margin: Flat and Intact N/A N/A Granulation Amount: Large (67-100%) N/A N/A Granulation Quality: Pink N/A N/A Necrotic Amount: Small (1-33%) N/A N/A Necrotic Tissue:  Eschar, Adherent Slough N/A N/A Exposed Structures: Fat Layer (Subcutaneous Tissue) N/A N/A Exposed: Yes Fascia: No Tendon: No Muscle: No Joint: No Bone: No Epithelialization: None N/A N/A Treatment Notes Electronic Signature(s) Signed: 08/02/2019 6:18:36 PM By: Elliot Gurney, BSN, RN, CWS, Kim RN, BSN Entered By: Elliot Gurney, BSN, RN, CWS, Kim on 08/02/2019 13:51:37 Trusty, Jorje Guild ([id number]) -------------------------------------------------------------------------------- Multi-Disciplinary Care Plan Details Patient Name: [name] Date of Service: 08/02/2019 1:15 PM Medical Record Number: [id number] Patient Account Number: [id number] Date of Birth/Sex: 1945/09/14 (74 y.o. M) Treating RN: Huel Coventry Primary Care Laylana Gerwig: Etheleen Nicks Other Clinician: Referring Lakiyah Arntson: Etheleen Nicks Treating Tawanda Schall/Extender: Linwood Dibbles, HOYT Weeks in Treatment: 1 Active Inactive Necrotic Tissue Nursing Diagnoses: Impaired tissue integrity related to necrotic/devitalized tissue Goals: Necrotic/devitalized tissue will be minimized in the wound bed Date Initiated: 07/20/2019 Target Resolution Date: 08/19/2019 Goal Status: Active Interventions: Assess patient pain level pre-, during and post procedure and prior to discharge Treatment Activities: Apply topical anesthetic as ordered : 07/20/2019 Notes: Orientation to the Wound Care Program Nursing  Diagnoses: Knowledge deficit related to the wound healing center program Goals: Patient/caregiver will verbalize understanding of the Wound Healing Center Program Date Initiated: 07/20/2019 Target Resolution Date: 08/19/2019 Goal Status: Active Interventions: Provide education on orientation to the wound center Notes: Pain, Acute or Chronic Nursing Diagnoses: Pain Management - Non-cyclic Acute (Procedural) Goals: Patient will verbalize adequate pain control and receive pain control interventions during procedures as needed Date Initiated: 07/20/2019 Target Resolution Date: 08/19/2019 Goal Status: Active Interventions: Assess comfort goal upon admission Notes: Venous Leg Ulcer Nursing Diagnoses: Knowledge deficit related to disease process and management Goals: Patient will maintain optimal edema control BEDFORD, WINSOR ([id number]) Date Initiated: 07/20/2019 Target Resolution Date: 08/19/2019 Goal Status: Active Interventions: Assess peripheral edema status every visit. Compression as ordered Treatment Activities: Therapeutic compression applied : 07/20/2019 Notes: Wound/Skin Impairment Nursing Diagnoses: Knowledge deficit related to ulceration/compromised skin integrity Goals: Ulcer/skin breakdown will have a volume reduction of 30% by week 4 Date Initiated: 07/20/2019 Target Resolution Date: 08/19/2019 Goal Status: Active Interventions: Assess patient/caregiver ability to obtain necessary supplies Provide education on ulcer and skin care Treatment Activities: Skin care regimen initiated : 07/20/2019 Notes: Electronic Signature(s) Signed: 08/02/2019 6:18:36 PM By: Elliot Gurney, BSN, RN, CWS, Kim RN, BSN Entered By: Elliot Gurney, BSN, RN, CWS, Kim on 08/02/2019 13:51:27 Kopko, Jorje Guild ([id number]) -------------------------------------------------------------------------------- Pain Assessment Details Patient Name: [name] Date of Service: 08/02/2019 1:15 PM Medical Record  Number: [id number] Patient Account Number: [id number] Date of Birth/Sex: [birth year] (74 y.o. M) Treating RN: Rodell Perna Primary Care Jehan Ranganathan: Etheleen Nicks Other Clinician: Referring Taline Nass: Etheleen Nicks Treating Keary Hanak/Extender: Linwood Dibbles, HOYT Weeks in Treatment: 1 Active Problems Location of Pain Severity and Description of Pain Patient Has Paino No Site Locations Pain Management and Medication Current Pain Management: Electronic Signature(s) Signed: 08/04/2019 10:39:21 AM By: Rodell Perna Entered By: Rodell Perna on 08/02/2019 13:24:47 Gambrel, Jorje Guild ([id number]) -------------------------------------------------------------------------------- Patient/Caregiver Education Details Patient Name: [name] Date of Service: 08/02/2019 1:15 PM Medical Record Number: [id number] Patient Account Number: [id number] Date of Birth/Gender: [birth year] (74 y.o. M) Treating RN: Huel Coventry Primary Care Physician: Etheleen Nicks Other Clinician: Referring Physician: Etheleen Nicks Treating Physician/Extender: Skeet Simmer in Treatment: 1 Education Assessment Education Provided To: Patient Education Topics Provided Wound/Skin Impairment: Handouts: Caring for Your Ulcer Methods: Demonstration, Explain/Verbal Responses: State content correctly Electronic Signature(s) Signed: 08/02/2019 6:18:36 PM By: Elliot Gurney, BSN, RN, CWS, Kim RN, BSN Entered By: Elliot Gurney, BSN, RN, CWS, Kim on  08/02/2019 13:58:52 Botting, Hobert RMarland Kitchen ([id number]) -------------------------------------------------------------------------------- Wound Assessment Details Patient Name: BRITTNEY, CARAWAY. Date of Service: 08/02/2019 1:15 PM Medical Record Number: [id number] Patient Account Number: [id number] Date of Birth/Sex: [birth year] (74 y.o. M) Treating RN: Rodell Perna Primary Care Flor Houdeshell: Etheleen Nicks Other Clinician: Referring Kmari Halter: Etheleen Nicks Treating Tharon Bomar/Extender: Linwood Dibbles, HOYT Weeks in  Treatment: 1 Wound Status Wound Number: 7 Primary Etiology: Venous Leg Ulcer Wound Location: Right, Lateral Ankle Wound Status: Open Wounding Event: Blister Comorbid History: Anemia, Sleep Apnea, Hypertension, Osteoarthritis Date Acquired: 06/20/2019 Weeks Of Treatment: 1 Clustered Wound: No Photos Wound Measurements Length: (cm) 2.4 Width: (cm) 1.5 Depth: (cm) 0.2 Area: (cm) 2.827 Volume: (cm) 0.565 % Reduction in Area: 46.8% % Reduction in Volume: 46.8% Epithelialization: None Tunneling: No Undermining: No Wound Description Classification: Full Thickness Without Exposed Support Struct Wound Margin: Flat and Intact Exudate Amount: Medium Exudate Type: Serous Exudate Color: amber ures Foul Odor After Cleansing: No Slough/Fibrino Yes Wound Bed Granulation Amount: Large (67-100%) Exposed Structure Granulation Quality: Pink Fascia Exposed: No Necrotic Amount: Small (1-33%) Fat Layer (Subcutaneous Tissue) Exposed: Yes Necrotic Quality: Eschar, Adherent Slough Tendon Exposed: No Muscle Exposed: No Joint Exposed: No Bone Exposed: No Treatment Notes Wound #7 (Right, Lateral Ankle) Notes iodoflex, abd, 3 layer right, unna to anchor Electronic Signature(s) Signed: 08/04/2019 10:39:21 AM By: Tanna Furry, Jorje Guild ([id number]) Entered By: Rodell Perna on 08/02/2019 13:30:14 Bechler, Jorje Guild ([id number]) -------------------------------------------------------------------------------- Vitals Details Patient Name: [name] Date of Service: 08/02/2019 1:15 PM Medical Record Number: [id number] Patient Account Number: [id number] Date of Birth/Sex: [birth year] (74 y.o. M) Treating RN: Rodell Perna Primary Care Sohum Delillo: Etheleen Nicks Other Clinician: Referring Ethelmae Ringel: Etheleen Nicks Treating Stanford Strauch/Extender: Linwood Dibbles, HOYT Weeks in Treatment: 1 Vital Signs Time Taken: 13:24 Temperature (F): 98.6 Height (in): 69 Pulse (bpm): 56 Weight (lbs):  320 Respiratory Rate (breaths/min): 18 Body Mass Index (BMI): 47.3 Blood Pressure (mmHg): 139/71 Reference Range: 80 - 120 mg / dl Electronic Signature(s) Signed: 08/04/2019 10:39:21 AM By: Rodell Perna Entered By: Rodell Perna on 08/02/2019 13:28:36

## 2019-08-02 NOTE — Progress Notes (Addendum)
[name] ([id number]) Visit Report for 08/02/2019 Chief Complaint Document Details Patient Name: [name]. Date of Service: 08/02/2019 1:15 PM Medical Record Number: [id number] Patient Account Number: [id number] Date of Birth/Sex: 02-17-1945 (74 y.o. M) Treating RN: Huel Coventry Primary Care Provider: Etheleen Nicks Other Clinician: Referring Provider: Etheleen Nicks Treating Provider/Extender: Linwood Dibbles, Brandin Dilday Weeks in Treatment: 1 Information Obtained from: Patient Chief Complaint Right lateral ankle ulcer 07/20/2019; patient is here for a review of the wound on the right lateral ankle Electronic Signature(s) Signed: 08/02/2019 1:19:51 PM By: Lenda Kelp PA-C Entered By: Lenda Kelp on 08/02/2019 13:19:51 [name], [name] ([id number]) -------------------------------------------------------------------------------- HPI Details Patient Name: [name]. Date of Service: 08/02/2019 1:15 PM Medical Record Number: [id number] Patient Account Number: [id number] Date of Birth/Sex: 1945/02/02 (74 y.o. M) Treating RN: Huel Coventry Primary Care Provider: Etheleen Nicks Other Clinician: Referring Provider: Etheleen Nicks Treating Provider/Extender: Linwood Dibbles, Tatyanna Cronk Weeks in Treatment: 1 History of Present Illness HPI Description: As noted above the patient had a injury due to working in the yard and this rapidly progressed to a ulcerated area with an infection. In October 2014 he did have some vascular studies done but never saw a Careers adviser and never had any surgery for varicose veins. His past medical history is significant for hypertension, obstructive sleep apnea, morbid obesity, stasis dermatitis of both lower extremities and his past surgical history is suggestive of hip surgery on the right, gastric bypass surgery, cholecystectomy, hip replacement. He has recently been put on Levaquin 500 milligrams daily, Bactrim DS 1 twice a day and Bactroban 2% topical ointment locally. No  recent labs or vascular or imaging studies done. 07/11/2014 a venous duplex study was done on 07/05/2014 -- it showed no evidence of deep vein thrombosis or superficial thrombosis bilaterally. 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 The patient has an appointment to see the vascular surgeons on June 20. 07/18/2014 his appointment with the vascular surgeons is rescheduled for this afternoon. 07/25/2014 -- he saw the vascular surgeon and is going to have a endovenous procedure done on July 29. 08/01/2014 -- he was diagnosed with a UTI and is on ciprofloxacin for 10 days. readmission: 11/27/15 On evaluation today patient is having discomfort in the right ankle region following an open wound from having dropped a cardboard box striking his ankle. He tells me that this has not healed despite many of the measures that he learned from previous injuries when he was seen at our wound center. Therefore he didn't come in for reevaluation today. He has not noted any significant purulent discharge at this point in time. 12/04/15 patient appears to be doing somewhat better at this point in time today in regard to his ankle wound. it is measuring smaller and looking cleaner. 12/11/15; patient wound continues to look improve this is on the right lateral ankle. Debrided of surface slough and nonviable subcutaneous tissue. Using silver alginate 12-18-15 [name] presents today with his wife for follow-up on right lower extremity venous ulcer. He denies any changes or complications over the past week. He is tolerating compression therapy to his right lower extremity and has a properly fitting compression hose to the left lower extremity. 12/25/15; patient with a wound on his right lateral lower extremity in the setting of severe venous insufficiency [originally trauma against the box]. His edema is well controlled he is using  Prisma. 01/01/16; continued improvement using Prisma. He has  severe venous insufficiency however the wound was originally trauma against a box. 01/08/16; his wound is totally epithelialized. We will give him measurements for graded pressure stockings which I think he is going to get at elastic therapy in [facility] Readmission: 10/30/17 patient presents today for reevaluation in our clinic although it has been since 2017 that we've last seen him. He has a ulcer on the right lateral ankle which he tells me began roughly 2 months ago. He states that he had one similar on the left ankle although this completely healed without any complication. However the one on the right as continue to give him a lot of trouble and is not healing appropriately. He really was not sure what the best thing to put on the area would be and therefore he's just been putting advantage to cover it. Another treatment has been utilized at this time. Fortunately there is not appear to be any evidence of significant infection necessarily although there is some evidence of abnormal discoloration in regard to the base of the wound that does not look as healthy as I was on the lives life. He does have erythema surrounding but I'm not sure if this is just due to chronic venous stasis/lymphedema or if it truly is anything infection wise going on at this point. Nonetheless in general the patient seems to be doing fairly well he's not having too much pain although he does have some discomfort. No fevers, chills, nausea, or vomiting noted at this time. 11/13/17 on evaluation today patient actually appears to be doing much better in regard to his wound currently. There does seem to be evidence of good improvement there still a lot of maceration and some necrotic tissue on the surface of the wound but this was cleaned away very well today. Overall I'm very pleased with how things seem to be progressing in general. We did get the  results of his culture back which showed that he did have evidence of Staphylococcus aureus as well as group B strep. It does appear that the Bactrim was sensitive for both. The wound again does look better. 11/20/17 on evaluation today patient's right lateral ankle ulcer actually appears to be doing excellent at this time. He has been tolerating the dressing changes without complication. With that being said overall I do feel like that he has made great progress in the few weeks we've been seeing him at this point. No fevers, chills, nausea, or vomiting noted at this time. 11/30/17 on evaluation today patient appears to be doing extremely well at this point in regard to his right lateral ankle ulcer. He has been tolerating the dressing changes without complication. In general I've been very pleased with the overall progress. His wound overview chart shows that he is making steady progress towards closure. 12/07/17 on evaluation today patient actually appears to be doing well in regard to his ulcer. He does have somewhat of an irregular heart rate noted at this point this was confirmed by manual checks as well. However it was not as low as the initial reading by the machine which was around 48 he was closer to around 58-62. Nonetheless it does sound as if you may have a little bit of an irregularity possibly a field although it was somewhat slow and difficult to gauge the exact rhythm. Otherwise his wound itself seems to be showing signs of great improvement with Meckel, Kiondre R. ([id number]) good epithelialization. He's tolerating the Iodoflex well. 12/14/17 on evaluation today  patient continues to show signs of great improvement in my pinion. Overall I'm very pleased with the progress that is made. With that being said though he has shown improvement he tells me that for the first couple of days after we put his wrap on that he has severe pain for pretty much that entire amount of time. Nonetheless  at this time I'm not seeing any evidence of infection which is good news. I think this may actually be coming from the actual dressing itself. That is the Iodoflex. 12/22/17 on evaluation today patient actually appears to be doing much better in regard to his ulcer. He's been shown signs of improvement which is great news. Fortunately there does not appear to be any evidence of infection at this time. Overall I feel like he has shown excellent progress even since just last week. 12/29/17 on evaluation today patient actually appears to be doing excellent although he does have some of the dressing material stuck to the wound bed. Fortunately there does not appear to be any signs of infection which is great news. Overall I'm very pleased with the progression of his wound. 01/05/18 on evaluation today patient actually appears to be doing rather well in regard to his ankle ulcer. He has been tolerating the dressing changes without complication. Overall very pleased. He seems to be very close to healing. 01/14/18 on evaluation today patient actually appears to be doing excellent in regard to his right lateral lower extremity wound. He's been tolerating the dressing changes without complication. Fortunately he appears to be completely healed today. READMISSION 07/20/2019 This is a now 73 year old man that we have had on in this clinic on multiple occasions usually with a wound on the right lateral ankle in the setting of chronic venous hypertension. Looking at the notes from 2016 he had venous reflux studies saw vascular surgery and was supposed to have a procedure but I cannot see what procedure that was. He was last here in 2019 at which time he had an ulcer in this same area. He often describes minor trauma. On this occasion he simply saw 2 blisters one of the blisters opened and healed the other resulted in this wound he has not been using his juxta lite stockings in fact according to his wife he use  these for perhaps 2 or 3 months after he was last here but has not used them since. He does keep his legs elevated. He is a minimal ambulator walking with crutches secondary to left hip issues. Past medical history is reviewed he has hypertension, obstructive sleep apnea on CPAP and O2, stasis dermatitis, hip history of a hip surgery, venous insufficiency with lymphedema. Urinary retention requiring a suprapubic catheter, panniculitis, status post Roux-en-Y gastric bypass ABIs in our clinic were 1.22 on the right and 1.17 on the left 6/30; right lateral ankle wound which is a recurrent problem in the setting of chronic venous hypertension. He had a procedure in 2016 I will need to look this up. We have been using Iodoflex under the wound under compression. No evidence of arterial insufficiency 08/02/2019 on evaluation today patient's wound actually showed signs of significant improvement which is great news. The Iodoflex does seem to be doing a great job keeping the necrotic tissue under control and in fact I was able to mechanically clean this away today without the use of sharp debridement which the patient tolerated in an excellent fashion. There is no evidence of infection currently. Electronic Signature(s) Signed: 08/02/2019 3:03:55  PM By: Lenda KelpStone III, Aariyah Sampey PA-C Entered By: Lenda KelpStone III, Azlin Zilberman on 08/02/2019 15:03:54 [name], [name] ([id number]) -------------------------------------------------------------------------------- Physical Exam Details Patient Name: [name], Treysean R. Date of Service: 08/02/2019 1:15 PM Medical Record Number: [id number] Patient Account Number: [id number] Date of Birth/Sex: 04/03/1945 (74 y.o. M) Treating RN: Huel CoventryWoody, Kim Primary Care Provider: Etheleen NicksMACDONALD, KERI Other Clinician: Referring Provider: Etheleen NicksMACDONALD, KERI Treating Provider/Extender: Linwood DibblesSTONE III, Deloras Reichard Weeks in Treatment: 1 Constitutional Well-nourished and well-hydrated in no acute distress. Respiratory normal breathing  without difficulty. Psychiatric this patient is able to make decisions and demonstrates good insight into disease process. Alert and Oriented x 3. pleasant and cooperative. Notes Upon inspection patient's wound again showed slough on the surface of the wound but I was able to mechanically debride this away with saline gauze he tolerated all that today without complication post debridement wound bed appears to be doing much better Electronic Signature(s) Signed: 08/02/2019 3:04:07 PM By: Lenda KelpStone III, Easton Sivertson PA-C Entered By: Lenda KelpStone III, Zailyn Rowser on 08/02/2019 15:04:07 [name], [name] ([id number]) -------------------------------------------------------------------------------- Physician Orders Details Patient Name: [name], Ryshawn R. Date of Service: 08/02/2019 1:15 PM Medical Record Number: [id number] Patient Account Number: [id number] Date of Birth/Sex: 07/24/1945 (74 y.o. M) Treating RN: Huel CoventryWoody, Kim Primary Care Provider: Etheleen NicksMACDONALD, KERI Other Clinician: Referring Provider: Etheleen NicksMACDONALD, KERI Treating Provider/Extender: Linwood DibblesSTONE III, Masoud Nyce Weeks in Treatment: 1 Verbal / Phone Orders: No Diagnosis Coding ICD-10 Coding Code Description I87.331 Chronic venous hypertension (idiopathic) with ulcer and inflammation of right lower extremity L97.312 Non-pressure chronic ulcer of right ankle with fat layer exposed Wound Cleansing Wound #7 Right,Lateral Ankle o Cleanse wound with mild soap and water o May Shower, gently pat wound dry prior to applying new dressing. Anesthetic (add to Medication List) Wound #7 Right,Lateral Ankle o Topical Lidocaine 4% cream applied to wound bed prior to debridement (In Clinic Only). Primary Wound Dressing Wound #7 Right,Lateral Ankle o Iodoflex Secondary Dressing Wound #7 Right,Lateral Ankle o ABD pad Dressing Change Frequency Wound #7 Right,Lateral Ankle o Change dressing every week Follow-up Appointments Wound #7 Right,Lateral Ankle o Return Appointment in 1  week. o Nurse Visit as needed Edema Control Wound #7 Right,Lateral Ankle o 3 Layer Compression System - Right Lower Extremity o Patient to wear own Velcro compression garment. - left o Elevate legs to the level of the heart and pump ankles as often as possible Additional Orders / Instructions Wound #7 Right,Lateral Ankle o Increase protein intake. o Activity as tolerated Electronic Signature(s) Signed: 08/02/2019 5:02:19 PM By: Lenda KelpStone III, Emonee Winkowski PA-C Signed: 08/02/2019 6:18:36 PM By: Elliot GurneyWoody, BSN, RN, CWS, Kim RN, BSN Entered By: Elliot GurneyWoody, BSN, RN, CWS, Kim on 08/02/2019 13:58:13 Moragne, [name] ([id number]) -------------------------------------------------------------------------------- Problem List Details Patient Name: [name], Armarion R. Date of Service: 08/02/2019 1:15 PM Medical Record Number: [id number] Patient Account Number: [id number] Date of Birth/Sex: 10/09/1945 (74 y.o. M) Treating RN: Huel CoventryWoody, Kim Primary Care Provider: Etheleen NicksMACDONALD, KERI Other Clinician: Referring Provider: Etheleen NicksMACDONALD, KERI Treating Provider/Extender: Linwood DibblesSTONE III, Markea Ruzich Weeks in Treatment: 1 Active Problems ICD-10 Encounter Code Description Active Date MDM Diagnosis I87.331 Chronic venous hypertension (idiopathic) with ulcer and inflammation of 07/20/2019 No Yes right lower extremity L97.312 Non-pressure chronic ulcer of right ankle with fat layer exposed 07/20/2019 No Yes Inactive Problems Resolved Problems Electronic Signature(s) Signed: 08/02/2019 1:19:45 PM By: Lenda KelpStone III, Lilli Dewald PA-C Entered By: Lenda KelpStone III, Aydia Maj on 08/02/2019 13:19:45 Allbritton, Oryn R. ([id number]) -------------------------------------------------------------------------------- Progress Note Details Patient Name: [name], [name] Date of Service: 08/02/2019 1:15 PM Medical Record Number: [id number] Patient Account Number: [id number]  Date of Birth/Sex: Sep 29, 1945 (74 y.o. M) Treating RN: Huel Coventry Primary Care Provider: Etheleen Nicks Other  Clinician: Referring Provider: Etheleen Nicks Treating Provider/Extender: Linwood Dibbles, Carlin Mamone Weeks in Treatment: 1 Subjective Chief Complaint Information obtained from Patient Right lateral ankle ulcer 07/20/2019; patient is here for a review of the wound on the right lateral ankle History of Present Illness (HPI) As noted above the patient had a injury due to working in the yard and this rapidly progressed to a ulcerated area with an infection. In October 2014 he did have some vascular studies done but never saw a Careers adviser and never had any surgery for varicose veins. His past medical history is significant for hypertension, obstructive sleep apnea, morbid obesity, stasis dermatitis of both lower extremities and his past surgical history is suggestive of hip surgery on the right, gastric bypass surgery, cholecystectomy, hip replacement. He has recently been put on Levaquin 500 milligrams daily, Bactrim DS 1 twice a day and Bactroban 2% topical ointment locally. No recent labs or vascular or imaging studies done. 07/11/2014 a venous duplex study was done on 07/05/2014 -- it showed no evidence of deep vein thrombosis or superficial thrombosis bilaterally. 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 The patient has an appointment to see the vascular surgeons on June 20. 07/18/2014 his appointment with the vascular surgeons is rescheduled for this afternoon. 07/25/2014 -- he saw the vascular surgeon and is going to have a endovenous procedure done on July 29. 08/01/2014 -- he was diagnosed with a UTI and is on ciprofloxacin for 10 days. readmission: 11/27/15 On evaluation today patient is having discomfort in the right ankle region following an open wound from having dropped a cardboard box striking his ankle. He tells me that this has not healed despite many of the measures that he learned from previous injuries when he was  seen at our wound center. Therefore he didn't come in for reevaluation today. He has not noted any significant purulent discharge at this point in time. 12/04/15 patient appears to be doing somewhat better at this point in time today in regard to his ankle wound. it is measuring smaller and looking cleaner. 12/11/15; patient wound continues to look improve this is on the right lateral ankle. Debrided of surface slough and nonviable subcutaneous tissue. Using silver alginate 12-18-15 Mr. Sumpter presents today with his wife for follow-up on right lower extremity venous ulcer. He denies any changes or complications over the past week. He is tolerating compression therapy to his right lower extremity and has a properly fitting compression hose to the left lower extremity. 12/25/15; patient with a wound on his right lateral lower extremity in the setting of severe venous insufficiency [originally trauma against the box]. His edema is well controlled he is using Prisma. 01/01/16; continued improvement using Prisma. He has severe venous insufficiency however the wound was originally trauma against a box. 01/08/16; his wound is totally epithelialized. We will give him measurements for graded pressure stockings which I think he is going to get at elastic therapy in [facility] Readmission: 10/30/17 patient presents today for reevaluation in our clinic although it has been since 2017 that we've last seen him. He has a ulcer on the right lateral ankle which he tells me began roughly 2 months ago. He states that he had one similar on the left ankle although this completely healed without any complication. However the one on the right as continue  to give him a lot of trouble and is not healing appropriately. He really was not sure what the best thing to put on the area would be and therefore he's just been putting advantage to cover it. Another treatment has been utilized at this time. Fortunately  there is not appear to be any evidence of significant infection necessarily although there is some evidence of abnormal discoloration in regard to the base of the wound that does not look as healthy as I was on the lives life. He does have erythema surrounding but I'm not sure if this is just due to chronic venous stasis/lymphedema or if it truly is anything infection wise going on at this point. Nonetheless in general the patient seems to be doing fairly well he's not having too much pain although he does have some discomfort. No fevers, chills, nausea, or vomiting noted at this time. 11/13/17 on evaluation today patient actually appears to be doing much better in regard to his wound currently. There does seem to be evidence of good improvement there still a lot of maceration and some necrotic tissue on the surface of the wound but this was cleaned away very well today. Overall I'm very pleased with how things seem to be progressing in general. We did get the results of his culture back which showed that he did have evidence of Staphylococcus aureus as well as group B strep. It does appear that the Bactrim was sensitive for both. The wound again does look better. 11/20/17 on evaluation today patient's right lateral ankle ulcer actually appears to be doing excellent at this time. He has been tolerating the dressing changes without complication. With that being said overall I do feel like that he has made great progress in the few weeks we've been seeing him at this point. No fevers, chills, nausea, or vomiting noted at this time. 11/30/17 on evaluation today patient appears to be doing extremely well at this point in regard to his right lateral ankle ulcer. He has been tolerating the dressing changes without complication. In general I've been very pleased with the overall progress. His wound overview chart shows that he is making steady progress towards closure. [name], [name] ([id number]) 12/07/17  on evaluation today patient actually appears to be doing well in regard to his ulcer. He does have somewhat of an irregular heart rate noted at this point this was confirmed by manual checks as well. However it was not as low as the initial reading by the machine which was around 48 he was closer to around 58-62. Nonetheless it does sound as if you may have a little bit of an irregularity possibly a field although it was somewhat slow and difficult to gauge the exact rhythm. Otherwise his wound itself seems to be showing signs of great improvement with good epithelialization. He's tolerating the Iodoflex well. 12/14/17 on evaluation today patient continues to show signs of great improvement in my pinion. Overall I'm very pleased with the progress that is made. With that being said though he has shown improvement he tells me that for the first couple of days after we put his wrap on that he has severe pain for pretty much that entire amount of time. Nonetheless at this time I'm not seeing any evidence of infection which is good news. I think this may actually be coming from the actual dressing itself. That is the Iodoflex. 12/22/17 on evaluation today patient actually appears to be doing much better in regard to his  ulcer. He's been shown signs of improvement which is great news. Fortunately there does not appear to be any evidence of infection at this time. Overall I feel like he has shown excellent progress even since just last week. 12/29/17 on evaluation today patient actually appears to be doing excellent although he does have some of the dressing material stuck to the wound bed. Fortunately there does not appear to be any signs of infection which is great news. Overall I'm very pleased with the progression of his wound. 01/05/18 on evaluation today patient actually appears to be doing rather well in regard to his ankle ulcer. He has been tolerating the dressing changes without complication.  Overall very pleased. He seems to be very close to healing. 01/14/18 on evaluation today patient actually appears to be doing excellent in regard to his right lateral lower extremity wound. He's been tolerating the dressing changes without complication. Fortunately he appears to be completely healed today. READMISSION 07/20/2019 This is a now 74 year old man that we have had on in this clinic on multiple occasions usually with a wound on the right lateral ankle in the setting of chronic venous hypertension. Looking at the notes from 2016 he had venous reflux studies saw vascular surgery and was supposed to have a procedure but I cannot see what procedure that was. He was last here in 2019 at which time he had an ulcer in this same area. He often describes minor trauma. On this occasion he simply saw 2 blisters one of the blisters opened and healed the other resulted in this wound he has not been using his juxta lite stockings in fact according to his wife he use these for perhaps 2 or 3 months after he was last here but has not used them since. He does keep his legs elevated. He is a minimal ambulator walking with crutches secondary to left hip issues. Past medical history is reviewed he has hypertension, obstructive sleep apnea on CPAP and O2, stasis dermatitis, hip history of a hip surgery, venous insufficiency with lymphedema. Urinary retention requiring a suprapubic catheter, panniculitis, status post Roux-en-Y gastric bypass ABIs in our clinic were 1.22 on the right and 1.17 on the left 6/30; right lateral ankle wound which is a recurrent problem in the setting of chronic venous hypertension. He had a procedure in 2016 I will need to look this up. We have been using Iodoflex under the wound under compression. No evidence of arterial insufficiency 08/02/2019 on evaluation today patient's wound actually showed signs of significant improvement which is great news. The Iodoflex does seem to be doing a  great job keeping the necrotic tissue under control and in fact I was able to mechanically clean this away today without the use of sharp debridement which the patient tolerated in an excellent fashion. There is no evidence of infection currently. Objective Constitutional Well-nourished and well-hydrated in no acute distress. Vitals Time Taken: 1:24 PM, Height: 69 in, Weight: 320 lbs, BMI: 47.3, Temperature: 98.6 F, Pulse: 56 bpm, Respiratory Rate: 18 breaths/min, Blood Pressure: 139/71 mmHg. Respiratory normal breathing without difficulty. Psychiatric this patient is able to make decisions and demonstrates good insight into disease process. Alert and Oriented x 3. pleasant and cooperative. General Notes: Upon inspection patient's wound again showed slough on the surface of the wound but I was able to mechanically debride this away with saline gauze he tolerated all that today without complication post debridement wound bed appears to be doing much better Integumentary (Hair, Skin)  Wound #7 status is Open. Original cause of wound was Blister. The wound is located on the Right,Lateral Ankle. The wound measures 2.4cm length x 1.5cm width x 0.2cm depth; 2.827cm^2 area and 0.565cm^3 volume. There is Fat Layer (Subcutaneous Tissue) Exposed exposed. There is no tunneling or undermining noted. There is a medium amount of serous drainage noted. The wound margin is flat and intact. There is large (67-100%) pink granulation within the wound bed. There is a small (1-33%) amount of necrotic tissue within the wound bed including Carter, Hogan R. ([id number]) Eschar and Adherent Slough. Assessment Active Problems ICD-10 Chronic venous hypertension (idiopathic) with ulcer and inflammation of right lower extremity Non-pressure chronic ulcer of right ankle with fat layer exposed Plan Wound Cleansing: Wound #7 Right,Lateral Ankle: Cleanse wound with mild soap and water May Shower, gently pat wound dry  prior to applying new dressing. Anesthetic (add to Medication List): Wound #7 Right,Lateral Ankle: Topical Lidocaine 4% cream applied to wound bed prior to debridement (In Clinic Only). Primary Wound Dressing: Wound #7 Right,Lateral Ankle: Iodoflex Secondary Dressing: Wound #7 Right,Lateral Ankle: ABD pad Dressing Change Frequency: Wound #7 Right,Lateral Ankle: Change dressing every week Follow-up Appointments: Wound #7 Right,Lateral Ankle: Return Appointment in 1 week. Nurse Visit as needed Edema Control: Wound #7 Right,Lateral Ankle: 3 Layer Compression System - Right Lower Extremity Patient to wear own Velcro compression garment. - left Elevate legs to the level of the heart and pump ankles as often as possible Additional Orders / Instructions: Wound #7 Right,Lateral Ankle: Increase protein intake. Activity as tolerated 1. I am that I suggest currently that we go ahead and continue with the Iodoflex for this week especially considering he is actually going out of town I do not want to have something new in case there are any complications. 2. I am also going to suggest that we go ahead and continue with the 3 layer compression wrap that does seem to be doing well to keep his edema under good control. 3. I am also can recommend he continue to elevate his legs much as possible try to keep his edema under good control. We will see patient back for reevaluation in 1 week here in the clinic. If anything worsens or changes patient will contact our office for additional recommendations. Electronic Signature(s) Signed: 08/02/2019 3:04:54 PM By: Lenda Kelp PA-C Entered By: Lenda Kelp on 08/02/2019 15:04:54 Sandler, [name] ([id number]) -------------------------------------------------------------------------------- SuperBill Details Patient Name: [name]. Date of Service: 08/02/2019 Medical Record Number: [id number] Patient Account Number: [id number] Date of Birth/Sex:  1945/03/13 (74 y.o. M) Treating RN: Huel Coventry Primary Care Provider: Etheleen Nicks Other Clinician: Referring Provider: Etheleen Nicks Treating Provider/Extender: Linwood Dibbles, Angelo Caroll Weeks in Treatment: 1 Diagnosis Coding ICD-10 Codes Code Description [phone] Chronic venous hypertension (idiopathic) with ulcer and inflammation of right lower extremity L97.312 Non-pressure chronic ulcer of right ankle with fat layer exposed Facility Procedures CPT4 Code: [id number] Description: (Facility Use Only) [phone] - APPLY MULTLAY COMPRS LWR RT LEG Modifier: Quantity: 1 Physician Procedures CPT4 Code Description: [id number] - WC PHYS LEVEL 3 - EST PT Modifier: Quantity: 1 CPT4 Code Description: ICD-10 Diagnosis Description I87.331 Chronic venous hypertension (idiopathic) with ulcer and inflammation of L97.312 Non-pressure chronic ulcer of right ankle with fat layer exposed Modifier: right lower extremit Quantity: y Psychologist, [facility] probation services) Signed: 08/02/2019 3:05:09 PM By: Lenda Kelp PA-C Entered By: Lenda Kelp on 08/02/2019 15:05:09

## 2019-08-02 NOTE — Progress Notes (Signed)
SERAFIN, DECATUR ([id number]) Visit Report for 07/27/2019 Debridement Details Patient Name: [name], [name]. Date of Service: 07/27/2019 1:45 PM Medical Record Number: [id number] Patient Account Number: [id number] Date of Birth/Sex: February 18, 1945 (74 y.o. M) Treating RN: Huel Coventry Primary Care Provider: Etheleen Nicks Other Clinician: Referring Provider: Etheleen Nicks Treating Provider/Extender: Altamese [place] in Treatment: 1 Debridement Performed for Wound #7 Right,Lateral Ankle Assessment: Performed By: Physician Maxwell Caul, MD Debridement Type: Debridement Severity of Tissue Pre Debridement: Fat layer exposed Level of Consciousness (Pre- Awake and Alert procedure): Pre-procedure Verification/Time Out Yes - 13:53 Taken: Total Area Debrided (L x W): 2.5 (cm) x 1.5 (cm) = 3.75 (cm) Tissue and other material Viable, Eschar, Slough, Subcutaneous, Slough debrided: Level: Skin/Subcutaneous Tissue Debridement Description: Excisional Instrument: Curette Bleeding: Moderate Hemostasis Achieved: Pressure Response to Treatment: Procedure was tolerated well Level of Consciousness (Post- Awake and Alert procedure): Post Debridement Measurements of Total Wound Length: (cm) 2.5 Width: (cm) 1.5 Depth: (cm) 0.2 Volume: (cm) 0.589 Character of Wound/Ulcer Post Debridement: Stable Severity of Tissue Post Debridement: Limited to breakdown of skin Post Procedure Diagnosis Same as Pre-procedure Electronic Signature(s) Signed: 07/27/2019 2:35:46 PM By: Elliot Gurney, BSN, RN, CWS, Kim RN, BSN Signed: 08/02/2019 7:31:01 AM By: Baltazar Najjar MD Entered By: Baltazar Najjar on 07/27/2019 14:00:41 [name], [name] ([id number]) -------------------------------------------------------------------------------- HPI Details Patient Name: [name]. Date of Service: 07/27/2019 1:45 PM Medical Record Number: [id number] Patient Account Number: [id number] Date of Birth/Sex: 01-27-1946 (74  y.o. M) Treating RN: Huel Coventry Primary Care Provider: Etheleen Nicks Other Clinician: Referring Provider: Etheleen Nicks Treating Provider/Extender: Altamese [place] in Treatment: 1 History of Present Illness HPI Description: As noted above the patient had a injury due to working in the yard and this rapidly progressed to a ulcerated area with an infection. In October 2014 he did have some vascular studies done but never saw a Careers adviser and never had any surgery for varicose veins. His past medical history is significant for hypertension, obstructive sleep apnea, morbid obesity, stasis dermatitis of both lower extremities and his past surgical history is suggestive of hip surgery on the right, gastric bypass surgery, cholecystectomy, hip replacement. He has recently been put on Levaquin 500 milligrams daily, Bactrim DS 1 twice a day and Bactroban 2% topical ointment locally. No recent labs or vascular or imaging studies done. 07/11/2014 a venous duplex study was done on 07/05/2014 -- it showed no evidence of deep vein thrombosis or superficial thrombosis bilaterally. 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 The patient has an appointment to see the vascular surgeons on June 20. 07/18/2014 his appointment with the vascular surgeons is rescheduled for this afternoon. 07/25/2014 -- he saw the vascular surgeon and is going to have a endovenous procedure done on July 29. 08/01/2014 -- he was diagnosed with a UTI and is on ciprofloxacin for 10 days. readmission: 11/27/15 On evaluation today patient is having discomfort in the right ankle region following an open wound from having dropped a cardboard box striking his ankle. He tells me that this has not healed despite many of the measures that he learned from previous injuries when he was seen at our wound center. Therefore he didn't come in for reevaluation today.  He has not noted any significant purulent discharge at this point in time. 12/04/15 patient appears to be doing somewhat better at this point in time today in regard to his ankle wound. it is measuring  smaller and looking cleaner. 12/11/15; patient wound continues to look improve this is on the right lateral ankle. Debrided of surface slough and nonviable subcutaneous tissue. Using silver alginate 12-18-15 [name] presents today with his wife for follow-up on right lower extremity venous ulcer. He denies any changes or complications over the past week. He is tolerating compression therapy to his right lower extremity and has a properly fitting compression hose to the left lower extremity. 12/25/15; patient with a wound on his right lateral lower extremity in the setting of severe venous insufficiency [originally trauma against the box]. His edema is well controlled he is using Prisma. 01/01/16; continued improvement using Prisma. He has severe venous insufficiency however the wound was originally trauma against a box. 01/08/16; his wound is totally epithelialized. We will give him measurements for graded pressure stockings which I think he is going to get at elastic therapy in [facility] [place] Readmission: 10/30/17 patient presents today for reevaluation in our clinic although it has been since 2017 that we've last seen him. He has a ulcer on the right lateral ankle which he tells me began roughly 2 months ago. He states that he had one similar on the left ankle although this completely healed without any complication. However the one on the right as continue to give him a lot of trouble and is not healing appropriately. He really was not sure what the best thing to put on the area would be and therefore he's just been putting advantage to cover it. Another treatment has been utilized at this time. Fortunately there is not appear to be any evidence of significant infection necessarily  although there is some evidence of abnormal discoloration in regard to the base of the wound that does not look as healthy as I was on the lives life. He does have erythema surrounding but I'm not sure if this is just due to chronic venous stasis/lymphedema or if it truly is anything infection wise going on at this point. Nonetheless in general the patient seems to be doing fairly well he's not having too much pain although he does have some discomfort. No fevers, chills, nausea, or vomiting noted at this time. 11/13/17 on evaluation today patient actually appears to be doing much better in regard to his wound currently. There does seem to be evidence of good improvement there still a lot of maceration and some necrotic tissue on the surface of the wound but this was cleaned away very well today. Overall I'm very pleased with how things seem to be progressing in general. We did get the results of his culture back which showed that he did have evidence of Staphylococcus aureus as well as group B strep. It does appear that the Bactrim was sensitive for both. The wound again does look better. 11/20/17 on evaluation today patient's right lateral ankle ulcer actually appears to be doing excellent at this time. He has been tolerating the dressing changes without complication. With that being said overall I do feel like that he has made great progress in the few weeks we've been seeing him at this point. No fevers, chills, nausea, or vomiting noted at this time. 11/30/17 on evaluation today patient appears to be doing extremely well at this point in regard to his right lateral ankle ulcer. He has been tolerating the dressing changes without complication. In general I've been very pleased with the overall progress. His wound overview chart shows that he is making steady progress towards closure. 12/07/17 on  evaluation today patient actually appears to be doing well in regard to his ulcer. He does have  somewhat of an irregular heart rate noted at this point this was confirmed by manual checks as well. However it was not as low as the initial reading by the machine which was around 48 he was closer to around 58-62. Nonetheless it does sound as if you may have a little bit of an irregularity possibly a field although it was somewhat slow and difficult to gauge the exact rhythm. Otherwise his wound itself seems to be showing signs of great improvement with Mazzarella, Tyrell [name]. ([id number]) good epithelialization. He's tolerating the Iodoflex well. 12/14/17 on evaluation today patient continues to show signs of great improvement in my pinion. Overall I'm very pleased with the progress that is made. With that being said though he has shown improvement he tells me that for the first couple of days after we put his wrap on that he has severe pain for pretty much that entire amount of time. Nonetheless at this time I'm not seeing any evidence of infection which is good news. I think this may actually be coming from the actual dressing itself. That is the Iodoflex. 12/22/17 on evaluation today patient actually appears to be doing much better in regard to his ulcer. He's been shown signs of improvement which is great news. Fortunately there does not appear to be any evidence of infection at this time. Overall I feel like he has shown excellent progress even since just last week. 12/29/17 on evaluation today patient actually appears to be doing excellent although he does have some of the dressing material stuck to the wound bed. Fortunately there does not appear to be any signs of infection which is great news. Overall I'm very pleased with the progression of his wound. 01/05/18 on evaluation today patient actually appears to be doing rather well in regard to his ankle ulcer. He has been tolerating the dressing changes without complication. Overall very pleased. He seems to be very close to healing. 01/14/18 on  evaluation today patient actually appears to be doing excellent in regard to his right lateral lower extremity wound. He's been tolerating the dressing changes without complication. Fortunately he appears to be completely healed today. READMISSION 07/20/2019 This is a now 74 year old man that we have had on in this clinic on multiple occasions usually with a wound on the right lateral ankle in the setting of chronic venous hypertension. Looking at the notes from 2016 he had venous reflux studies saw vascular surgery and was supposed to have a procedure but I cannot see what procedure that was. He was last here in 2019 at which time he had an ulcer in this same area. He often describes minor trauma. On this occasion he simply saw 2 blisters one of the blisters opened and healed the other resulted in this wound he has not been using his juxta lite stockings in fact according to his wife he use these for perhaps 2 or 3 months after he was last here but has not used them since. He does keep his legs elevated. He is a minimal ambulator walking with crutches secondary to left hip issues. Past medical history is reviewed he has hypertension, obstructive sleep apnea on CPAP and O2, stasis dermatitis, hip history of a hip surgery, venous insufficiency with lymphedema. Urinary retention requiring a suprapubic catheter, panniculitis, status post Roux-en-Y gastric bypass ABIs in our clinic were 1.22 on the right and 1.17 on the  left 6/30; right lateral ankle wound which is a recurrent problem in the setting of chronic venous hypertension. He had a procedure in 2016 I will need to look this up. We have been using Iodoflex under the wound under compression. No evidence of arterial insufficiency Electronic Signature(s) Signed: 08/02/2019 7:31:01 AM By: Baltazar Najjar MD Entered By: Baltazar Najjar on 07/27/2019 14:01:51 [name], [name]  ([id number]) -------------------------------------------------------------------------------- Physical Exam Details Patient Name: [name]. Date of Service: 07/27/2019 1:45 PM Medical Record Number: [id number] Patient Account Number: [id number] Date of Birth/Sex: 11-28-1945 (74 y.o. M) Treating RN: Huel Coventry Primary Care Provider: Etheleen Nicks Other Clinician: Referring Provider: Etheleen Nicks Treating Provider/Extender: Altamese [place] in Treatment: 1 Constitutional Sitting or standing Blood Pressure is within target range for patient.. Pulse regular and within target range for patient.Marland Kitchen Respirations regular, non- labored and within target range.. Temperature is normal and within the target range for the patient.Marland Kitchen appears in no distress. Notes Wound exam; right lateral ankle just below the right lateral malleolus. Still some debris under illumination. Callus on the edges. Both of these removed with a #5 curette hemostasis with direct pressure. Electronic Signature(s) Signed: 08/02/2019 7:31:01 AM By: Baltazar Najjar MD Entered By: Baltazar Najjar on 07/27/2019 14:02:45 [name], [name] ([id number]) -------------------------------------------------------------------------------- Physician Orders Details Patient Name: [name]. Date of Service: 07/27/2019 1:45 PM Medical Record Number: [id number] Patient Account Number: [id number] Date of Birth/Sex: 1945-12-27 (74 y.o. M) Treating RN: Huel Coventry Primary Care Provider: Etheleen Nicks Other Clinician: Referring Provider: Etheleen Nicks Treating Provider/Extender: Altamese [place] in Treatment: 1 Verbal / Phone Orders: No Diagnosis Coding Wound Cleansing Wound #7 Right,Lateral Ankle o Cleanse wound with mild soap and water o May Shower, gently pat wound dry prior to applying new dressing. Anesthetic (add to Medication List) Wound #7 Right,Lateral Ankle o Topical Lidocaine 4% cream applied to wound  bed prior to debridement (In Clinic Only). Primary Wound Dressing Wound #7 Right,Lateral Ankle o Iodoflex Secondary Dressing Wound #7 Right,Lateral Ankle o ABD pad Dressing Change Frequency Wound #7 Right,Lateral Ankle o Change dressing every week Follow-up Appointments Wound #7 Right,Lateral Ankle o Return Appointment in 1 week. o Nurse Visit as needed Edema Control Wound #7 Right,Lateral Ankle o 3 Layer Compression System - Right Lower Extremity o Patient to wear own Velcro compression garment. - left o Elevate legs to the level of the heart and pump ankles as often as possible Additional Orders / Instructions Wound #7 Right,Lateral Ankle o Increase protein intake. o Activity as tolerated Electronic Signature(s) Signed: 07/27/2019 2:35:46 PM By: Elliot Gurney, BSN, RN, CWS, Kim RN, BSN Signed: 08/02/2019 7:31:01 AM By: Baltazar Najjar MD Entered By: Elliot Gurney, BSN, RN, CWS, Kim on 07/27/2019 13:55:06 [name], [name] ([id number]) -------------------------------------------------------------------------------- Problem List Details Patient Name: [name], [name]. Date of Service: 07/27/2019 1:45 PM Medical Record Number: [id number] Patient Account Number: [id number] Date of Birth/Sex: December 18, 1945 (74 y.o. M) Treating RN: Huel Coventry Primary Care Provider: Etheleen Nicks Other Clinician: Referring Provider: Etheleen Nicks Treating Provider/Extender: Altamese [place] in Treatment: 1 Active Problems ICD-10 Encounter Code Description Active Date MDM Diagnosis I87.331 Chronic venous hypertension (idiopathic) with ulcer and inflammation of 07/20/2019 No Yes right lower extremity L97.312 Non-pressure chronic ulcer of right ankle with fat layer exposed 07/20/2019 No Yes Inactive Problems Resolved Problems Electronic Signature(s) Signed: 08/02/2019 7:31:01 AM By: Baltazar Najjar MD Entered By: Baltazar Najjar on 07/27/2019 13:58:26 [name], [name]  ([id number]) -------------------------------------------------------------------------------- Progress Note Details Patient Name: [name].  Date of Service: 07/27/2019 1:45 PM Medical Record Number: [id number] Patient Account Number: [id number] Date of Birth/Sex: [birth year] (74 y.o. M) Treating RN: Huel Coventry Primary Care Provider: Etheleen Nicks Other Clinician: Referring Provider: Etheleen Nicks Treating Provider/Extender: Altamese [place] in Treatment: 1 Subjective History of Present Illness (HPI) As noted above the patient had a injury due to working in the yard and this rapidly progressed to a ulcerated area with an infection. In October 2014 he did have some vascular studies done but never saw a Careers adviser and never had any surgery for varicose veins. His past medical history is significant for hypertension, obstructive sleep apnea, morbid obesity, stasis dermatitis of both lower extremities and his past surgical history is suggestive of hip surgery on the right, gastric bypass surgery, cholecystectomy, hip replacement. He has recently been put on Levaquin 500 milligrams daily, Bactrim DS 1 twice a day and Bactroban 2% topical ointment locally. No recent labs or vascular or imaging studies done. 07/11/2014 a venous duplex study was done on 07/05/2014 -- it showed no evidence of deep vein thrombosis or superficial thrombosis bilaterally. 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 The patient has an appointment to see the vascular surgeons on June 20. 07/18/2014 his appointment with the vascular surgeons is rescheduled for this afternoon. 07/25/2014 -- he saw the vascular surgeon and is going to have a endovenous procedure done on July 29. 08/01/2014 -- he was diagnosed with a UTI and is on ciprofloxacin for 10 days. readmission: 11/27/15 On evaluation today patient is having discomfort in the right  ankle region following an open wound from having dropped a cardboard box striking his ankle. He tells me that this has not healed despite many of the measures that he learned from previous injuries when he was seen at our wound center. Therefore he didn't come in for reevaluation today. He has not noted any significant purulent discharge at this point in time. 12/04/15 patient appears to be doing somewhat better at this point in time today in regard to his ankle wound. it is measuring smaller and looking cleaner. 12/11/15; patient wound continues to look improve this is on the right lateral ankle. Debrided of surface slough and nonviable subcutaneous tissue. Using silver alginate 12-18-15 [name] presents today with his wife for follow-up on right lower extremity venous ulcer. He denies any changes or complications over the past week. He is tolerating compression therapy to his right lower extremity and has a properly fitting compression hose to the left lower extremity. 12/25/15; patient with a wound on his right lateral lower extremity in the setting of severe venous insufficiency [originally trauma against the box]. His edema is well controlled he is using Prisma. 01/01/16; continued improvement using Prisma. He has severe venous insufficiency however the wound was originally trauma against a box. 01/08/16; his wound is totally epithelialized. We will give him measurements for graded pressure stockings which I think he is going to get at elastic therapy in [facility] Readmission: 10/30/17 patient presents today for reevaluation in our clinic although it has been since 2017 that we've last seen him. He has a ulcer on the right lateral ankle which he tells me began roughly 2 months ago. He states that he had one similar on the left ankle although this completely healed without any complication. However the one on the right as continue to give him a lot of trouble and is not healing  appropriately. He really was not sure what the best thing to put on the area would be and therefore he's just been putting advantage to cover it. Another treatment has been utilized at this time. Fortunately there is not appear to be any evidence of significant infection necessarily although there is some evidence of abnormal discoloration in regard to the base of the wound that does not look as healthy as I was on the lives life. He does have erythema surrounding but I'm not sure if this is just due to chronic venous stasis/lymphedema or if it truly is anything infection wise going on at this point. Nonetheless in general the patient seems to be doing fairly well he's not having too much pain although he does have some discomfort. No fevers, chills, nausea, or vomiting noted at this time. 11/13/17 on evaluation today patient actually appears to be doing much better in regard to his wound currently. There does seem to be evidence of good improvement there still a lot of maceration and some necrotic tissue on the surface of the wound but this was cleaned away very well today. Overall I'm very pleased with how things seem to be progressing in general. We did get the results of his culture back which showed that he did have evidence of Staphylococcus aureus as well as group B strep. It does appear that the Bactrim was sensitive for both. The wound again does look better. 11/20/17 on evaluation today patient's right lateral ankle ulcer actually appears to be doing excellent at this time. He has been tolerating the dressing changes without complication. With that being said overall I do feel like that he has made great progress in the few weeks we've been seeing him at this point. No fevers, chills, nausea, or vomiting noted at this time. 11/30/17 on evaluation today patient appears to be doing extremely well at this point in regard to his right lateral ankle ulcer. He has been tolerating the dressing  changes without complication. In general I've been very pleased with the overall progress. His wound overview chart shows that he is making steady progress towards closure. 12/07/17 on evaluation today patient actually appears to be doing well in regard to his ulcer. He does have somewhat of an irregular heart rate noted at this point this was confirmed by manual checks as well. However it was not as low as the initial reading by the machine which was around 48 he was closer to around 58-62. Nonetheless it does sound as if you may have a little bit of an irregularity possibly a field although it was somewhat slow and difficult to gauge the exact rhythm. Otherwise his wound itself seems to be showing signs of great improvement with good epithelialization. He's tolerating the Iodoflex well. [name], [name] ([id number]) 12/14/17 on evaluation today patient continues to show signs of great improvement in my pinion. Overall I'm very pleased with the progress that is made. With that being said though he has shown improvement he tells me that for the first couple of days after we put his wrap on that he has severe pain for pretty much that entire amount of time. Nonetheless at this time I'm not seeing any evidence of infection which is good news. I think this may actually be coming from the actual dressing itself. That is the Iodoflex. 12/22/17 on evaluation today patient actually appears to be doing much better in regard to his ulcer. He's been shown signs of improvement which is great news. Fortunately  there does not appear to be any evidence of infection at this time. Overall I feel like he has shown excellent progress even since just last week. 12/29/17 on evaluation today patient actually appears to be doing excellent although he does have some of the dressing material stuck to the wound bed. Fortunately there does not appear to be any signs of infection which is great news. Overall I'm very pleased  with the progression of his wound. 01/05/18 on evaluation today patient actually appears to be doing rather well in regard to his ankle ulcer. He has been tolerating the dressing changes without complication. Overall very pleased. He seems to be very close to healing. 01/14/18 on evaluation today patient actually appears to be doing excellent in regard to his right lateral lower extremity wound. He's been tolerating the dressing changes without complication. Fortunately he appears to be completely healed today. READMISSION 07/20/2019 This is a now 74 year old man that we have had on in this clinic on multiple occasions usually with a wound on the right lateral ankle in the setting of chronic venous hypertension. Looking at the notes from 2016 he had venous reflux studies saw vascular surgery and was supposed to have a procedure but I cannot see what procedure that was. He was last here in 2019 at which time he had an ulcer in this same area. He often describes minor trauma. On this occasion he simply saw 2 blisters one of the blisters opened and healed the other resulted in this wound he has not been using his juxta lite stockings in fact according to his wife he use these for perhaps 2 or 3 months after he was last here but has not used them since. He does keep his legs elevated. He is a minimal ambulator walking with crutches secondary to left hip issues. Past medical history is reviewed he has hypertension, obstructive sleep apnea on CPAP and O2, stasis dermatitis, hip history of a hip surgery, venous insufficiency with lymphedema. Urinary retention requiring a suprapubic catheter, panniculitis, status post Roux-en-Y gastric bypass ABIs in our clinic were 1.22 on the right and 1.17 on the left 6/30; right lateral ankle wound which is a recurrent problem in the setting of chronic venous hypertension. He had a procedure in 2016 I will need to look this up. We have been using Iodoflex under the  wound under compression. No evidence of arterial insufficiency Objective Constitutional Sitting or standing Blood Pressure is within target range for patient.. Pulse regular and within target range for patient.Marland Kitchen Respirations regular, non- labored and within target range.. Temperature is normal and within the target range for the patient.Marland Kitchen appears in no distress. Vitals Time Taken: 1:35 PM, Height: 69 in, Weight: 320 lbs, BMI: 47.3, Temperature: 98.2 F, Pulse: 60 bpm, Respiratory Rate: 18 breaths/min, Blood Pressure: 126/55 mmHg. General Notes: Wound exam; right lateral ankle just below the right lateral malleolus. Still some debris under illumination. Callus on the edges. Both of these removed with a #5 curette hemostasis with direct pressure. Integumentary (Hair, Skin) Wound #7 status is Open. Original cause of wound was Blister. The wound is located on the Right,Lateral Ankle. The wound measures 2.5cm length x 1.5cm width x 0.2cm depth; 2.945cm^2 area and 0.589cm^3 volume. There is Fat Layer (Subcutaneous Tissue) Exposed exposed. There is no tunneling or undermining noted. There is a medium amount of serous drainage noted. The wound margin is flat and intact. There is medium (34-66%) pink granulation within the wound bed. There is a medium (  34-66%) amount of necrotic tissue within the wound bed including Adherent Slough. 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 ([id number]) Procedures Wound #7 Pre-procedure diagnosis of Wound #7 is a Venous Leg Ulcer located on the Right,Lateral Ankle .Severity of Tissue Pre Debridement is: Fat layer exposed. There was a Excisional Skin/Subcutaneous Tissue Debridement with a total area of 3.75 sq cm performed by Maxwell Caul, MD. With the following instrument(s): Curette to remove  Viable tissue/material. Material removed includes Eschar, Subcutaneous Tissue, and Slough. No specimens were taken. A time out was conducted at 13:53, prior to the start of the procedure. A Moderate amount of bleeding was controlled with Pressure. The procedure was tolerated well. Post Debridement Measurements: 2.5cm length x 1.5cm width x 0.2cm depth; 0.589cm^3 volume. Character of Wound/Ulcer Post Debridement is stable. Severity of Tissue Post Debridement is: Limited to breakdown of skin. Post procedure Diagnosis Wound #7: Same as Pre-Procedure Plan Wound Cleansing: Wound #7 Right,Lateral Ankle: Cleanse wound with mild soap and water May Shower, gently pat wound dry prior to applying new dressing. Anesthetic (add to Medication List): Wound #7 Right,Lateral Ankle: Topical Lidocaine 4% cream applied to wound bed prior to debridement (In Clinic Only). Primary Wound Dressing: Wound #7 Right,Lateral Ankle: Iodoflex Secondary Dressing: Wound #7 Right,Lateral Ankle: ABD pad Dressing Change Frequency: Wound #7 Right,Lateral Ankle: Change dressing every week Follow-up Appointments: Wound #7 Right,Lateral Ankle: Return Appointment in 1 week. Nurse Visit as needed Edema Control: Wound #7 Right,Lateral Ankle: 3 Layer Compression System - Right Lower Extremity Patient to wear own Velcro compression garment. - left Elevate legs to the level of the heart and pump ankles as often as possible Additional Orders / Instructions: Wound #7 Right,Lateral Ankle: Increase protein intake. Activity as tolerated 1. Continue Iodoflex under compression 2. He is traveling beginning July 7 to [place]. He will come in on the 6 to be seen dressed and wrapped. He should be back the week after. Electronic Signature(s) Signed: 08/02/2019 7:31:01 AM By: Baltazar Najjar MD Entered By: Baltazar Najjar on 07/27/2019 14:03:33 [name], [name]  ([id number]) -------------------------------------------------------------------------------- SuperBill Details Patient Name: [name]. Date of Service: 07/27/2019 Medical Record Number: [id number] Patient Account Number: [id number] Date of Birth/Sex: 1945-05-28 (74 y.o. M) Treating RN: Huel Coventry Primary Care Provider: Etheleen Nicks Other Clinician: Referring Provider: Etheleen Nicks Treating Provider/Extender: Altamese [place] in Treatment: 1 Diagnosis Coding ICD-10 Codes Code Description [phone] Chronic venous hypertension (idiopathic) with ulcer and inflammation of right lower extremity L97.312 Non-pressure chronic ulcer of right ankle with fat layer exposed Facility Procedures CPT4 Code Description: 75916384 11042 - DEB SUBQ TISSUE 20 SQ CM/< Modifier: Quantity: 1 CPT4 Code Description: ICD-10 Diagnosis Description I87.331 Chronic venous hypertension (idiopathic) with ulcer and inflammation of L97.312 Non-pressure chronic ulcer of right ankle with fat layer exposed Modifier: right lower extremi Quantity: ty Physician Procedures CPT4 Code Description: 6659935 11042 - WC PHYS SUBQ TISS 20 SQ CM Modifier: Quantity: 1 CPT4 Code Description: ICD-10 Diagnosis Description I87.331 Chronic venous hypertension (idiopathic) with ulcer and inflammation of L97.312 Non-pressure chronic ulcer of right ankle with fat layer exposed Modifier: right lower extremit Quantity: y Psychologist, prison and probation services) Signed: 08/02/2019 7:31:01 AM By: Baltazar Najjar MD Entered By: Baltazar Najjar on 07/27/2019 14:03:52

## 2019-08-02 NOTE — Progress Notes (Signed)
[name], [name] ([id number]) Visit Report for 07/27/2019 Arrival Information Details Patient Name: [name], [name]. Date of Service: 07/27/2019 1:45 PM Medical Record Number: [id number] Patient Account Number: [id number] Date of Birth/Sex: 1945/12/12 (74 y.o. M) Treating RN: Huel Coventry Primary Care Magaline Steinberg: Etheleen Nicks Other Clinician: Referring Patryck Kilgore: Etheleen Nicks Treating Brenlyn Beshara/Extender: Altamese [place] in Treatment: 1 Visit Information History Since Last Visit Added or deleted any medications: No Patient Arrived: Cane Any new allergies or adverse reactions: No Arrival Time: 13:32 Had a fall or experienced change in No Accompanied By: wife activities of daily living that may affect Transfer Assistance: None risk of falls: Patient Identification Verified: Yes Signs or symptoms of abuse/neglect since last visito No Secondary Verification Process Completed: Yes Hospitalized since last visit: No Implantable device outside of the clinic excluding No cellular tissue based products placed in the center since last visit: Has Dressing in Place as Prescribed: Yes Has Compression in Place as Prescribed: Yes Pain Present Now: No Electronic Signature(s) Signed: 07/27/2019 4:40:20 PM By: Dayton Martes RCP, RRT, CHT Entered By: Dayton Martes on 07/27/2019 13:37:22 Loose, Jorje Guild ([id number]) -------------------------------------------------------------------------------- Encounter Discharge Information Details Patient Name: [name]. Date of Service: 07/27/2019 1:45 PM Medical Record Number: [id number] Patient Account Number: [id number] Date of Birth/Sex: 02-Jan-1946 (74 y.o. M) Treating RN: Huel Coventry Primary Care Eman Morimoto: Etheleen Nicks Other Clinician: Referring Ariyon Gerstenberger: Etheleen Nicks Treating Kalynn Declercq/Extender: Altamese [place] in Treatment: 1 Encounter Discharge Information Items Post Procedure Vitals Discharge  Condition: Stable Temperature (F): 98.2 Ambulatory Status: Crutches Pulse (bpm): 60 Discharge Destination: Home Respiratory Rate (breaths/min): 18 Transportation: Private Auto Blood Pressure (mmHg): 126/55 Accompanied By: wife Schedule Follow-up Appointment: Yes Clinical Summary of Care: Electronic Signature(s) Signed: 07/27/2019 2:35:46 PM By: Elliot Gurney, BSN, RN, CWS, Kim RN, BSN Entered By: Elliot Gurney, BSN, RN, CWS, Kim on 07/27/2019 13:57:41 Millirons, Jorje Guild ([id number]) -------------------------------------------------------------------------------- Lower Extremity Assessment Details Patient Name: [name], [name] Date of Service: 07/27/2019 1:45 PM Medical Record Number: [id number] Patient Account Number: [id number] Date of Birth/Sex: [birth year] (74 y.o. M) Treating RN: Rodell Perna Primary Care Jeena Arnett: Etheleen Nicks Other Clinician: Referring Arnella Pralle: Etheleen Nicks Treating Hartley Urton/Extender: Altamese [place] in Treatment: 1 Edema Assessment Assessed: [Left: No] [Right: No] Edema: [Left: N] [Right: o] Calf Left: Right: Point of Measurement: 34 cm From Medial Instep cm 42 cm Ankle Left: Right: Point of Measurement: 12 cm From Medial Instep cm 25 cm Vascular Assessment Pulses: Dorsalis Pedis Palpable: [Right:Yes] Electronic Signature(s) Signed: 07/27/2019 2:19:10 PM By: Rodell Perna Entered By: Rodell Perna on 07/27/2019 13:48:44 Padget, Xavien R. ([id number]) -------------------------------------------------------------------------------- Multi Wound Chart Details Patient Name: [name]. Date of Service: 07/27/2019 1:45 PM Medical Record Number: [id number] Patient Account Number: [id number] Date of Birth/Sex: 05-03-1945 (74 y.o. M) Treating RN: Huel Coventry Primary Care Javana Schey: Etheleen Nicks Other Clinician: Referring Jeani Fassnacht: Etheleen Nicks Treating Kable Haywood/Extender: Altamese [place] in Treatment: 1 Vital Signs Height(in): 69 Pulse(bpm):  60 Weight(lbs): 320 Blood Pressure(mmHg): 126/55 Body Mass Index(BMI): 47 Temperature(F): 98.2 Respiratory Rate(breaths/min): 18 Photos: [N/A:N/A] Wound Location: Right, Lateral Ankle N/A N/A Wounding Event: Blister N/A N/A Primary Etiology: Venous Leg Ulcer N/A N/A Comorbid History: Anemia, Sleep Apnea, N/A N/A Hypertension, Osteoarthritis Date Acquired: 06/20/2019 N/A N/A Weeks of Treatment: 1 N/A N/A Wound Status: Open N/A N/A Measurements L x W x D (cm) 2.5x1.5x0.2 N/A N/A Area (cm) : 2.945 N/A N/A Volume (cm) : 0.589 N/A N/A % Reduction in Area: 44.50% N/A N/A % Reduction in Volume:  44.50% N/A N/A Classification: Full Thickness Without Exposed N/A N/A Support Structures Exudate Amount: Medium N/A N/A Exudate Type: Serous N/A N/A Exudate Color: amber N/A N/A Wound Margin: Flat and Intact N/A N/A Granulation Amount: Medium (34-66%) N/A N/A Granulation Quality: Pink N/A N/A Necrotic Amount: Medium (34-66%) N/A N/A Exposed Structures: Fat Layer (Subcutaneous Tissue) N/A N/A Exposed: Yes Fascia: No Tendon: No Muscle: No Joint: No Bone: No Epithelialization: None N/A N/A Debridement: Debridement - Excisional N/A N/A Pre-procedure Verification/Time 13:53 N/A N/A Out Taken: Tissue Debrided: Necrotic/Eschar, Subcutaneous, N/A N/A Slough Level: Skin/Subcutaneous Tissue N/A N/A Debridement Area (sq cm): 3.75 N/A N/A Instrument: Curette N/A N/A Bleeding: Moderate N/A N/A Hemostasis Achieved: Pressure N/A N/A Debridement Treatment Procedure was tolerated well N/A N/A Response: 2.5x1.5x0.2 N/A N/A [name], [name] ([id number]) Post Debridement Measurements L x W x D (cm) Post Debridement Volume: 0.589 N/A N/A (cm) Procedures Performed: Debridement N/A N/A Treatment Notes Wound #7 (Right, Lateral Ankle) 1. Cleansed with: Cleanse wound with antibacterial soap and water 2. Anesthetic Topical Lidocaine 4% cream to wound bed prior to debridement 4. Dressing  Applied: Iodoflex 5. Secondary Dressing Applied ABD Pad 7. Secured with 3 Layer Compression System - Right Lower Extremity Electronic Signature(s) Signed: 08/02/2019 7:31:01 AM By: Baltazar Najjar MD Entered By: Baltazar Najjar on 07/27/2019 14:00:26 Cavallero, Jorje Guild ([id number]) -------------------------------------------------------------------------------- Multi-Disciplinary Care Plan Details Patient Name: [name]. Date of Service: 07/27/2019 1:45 PM Medical Record Number: [id number] Patient Account Number: [id number] Date of Birth/Sex: 03-18-1945 (74 y.o. M) Treating RN: Huel Coventry Primary Care Dawna Jakes: Etheleen Nicks Other Clinician: Referring Dorotha Hirschi: Etheleen Nicks Treating Mariena Meares/Extender: Altamese [place] in Treatment: 1 Active Inactive Necrotic Tissue Nursing Diagnoses: Impaired tissue integrity related to necrotic/devitalized tissue Goals: Necrotic/devitalized tissue will be minimized in the wound bed Date Initiated: 07/20/2019 Target Resolution Date: 08/19/2019 Goal Status: Active Interventions: Assess patient pain level pre-, during and post procedure and prior to discharge Treatment Activities: Apply topical anesthetic as ordered : 07/20/2019 Notes: Orientation to the Wound Care Program Nursing Diagnoses: Knowledge deficit related to the wound healing center program Goals: Patient/caregiver will verbalize understanding of the Wound Healing Center Program Date Initiated: 07/20/2019 Target Resolution Date: 08/19/2019 Goal Status: Active Interventions: Provide education on orientation to the wound center Notes: Pain, Acute or Chronic Nursing Diagnoses: Pain Management - Non-cyclic Acute (Procedural) Goals: Patient will verbalize adequate pain control and receive pain control interventions during procedures as needed Date Initiated: 07/20/2019 Target Resolution Date: 08/19/2019 Goal Status: Active Interventions: Assess comfort goal upon  admission Notes: Venous Leg Ulcer Nursing Diagnoses: Knowledge deficit related to disease process and management Goals: Patient will maintain optimal edema control CONTRELL, BALLENTINE ([id number]) Date Initiated: 07/20/2019 Target Resolution Date: 08/19/2019 Goal Status: Active Interventions: Assess peripheral edema status every visit. Compression as ordered Treatment Activities: Therapeutic compression applied : 07/20/2019 Notes: Wound/Skin Impairment Nursing Diagnoses: Knowledge deficit related to ulceration/compromised skin integrity Goals: Ulcer/skin breakdown will have a volume reduction of 30% by week 4 Date Initiated: 07/20/2019 Target Resolution Date: 08/19/2019 Goal Status: Active Interventions: Assess patient/caregiver ability to obtain necessary supplies Provide education on ulcer and skin care Treatment Activities: Skin care regimen initiated : 07/20/2019 Notes: Electronic Signature(s) Signed: 07/27/2019 2:35:46 PM By: Elliot Gurney, BSN, RN, CWS, Kim RN, BSN Entered By: Elliot Gurney, BSN, RN, CWS, Kim on 07/27/2019 13:51:38 Fussell, Jorje Guild ([id number]) -------------------------------------------------------------------------------- Pain Assessment Details Patient Name: [name] Date of Service: 07/27/2019 1:45 PM Medical Record Number: [id number] Patient Account Number: [id number] Date of Birth/Sex: 05/20/1945 (  74 y.o. M) Treating RN: Rodell Perna Primary Care Tommye Lehenbauer: Etheleen Nicks Other Clinician: Referring Ioan Landini: Etheleen Nicks Treating Eliora Nienhuis/Extender: Altamese [place] in Treatment: 1 Active Problems Location of Pain Severity and Description of Pain Patient Has Paino No Site Locations Pain Management and Medication Current Pain Management: Electronic Signature(s) Signed: 07/27/2019 2:19:10 PM By: Rodell Perna Entered By: Rodell Perna on 07/27/2019 13:46:55 Liverman, Jorje Guild  ([id number]) -------------------------------------------------------------------------------- Patient/Caregiver Education Details Patient Name: [name]. Date of Service: 07/27/2019 1:45 PM Medical Record Number: [id number] Patient Account Number: [id number] Date of Birth/Gender: 08/26/1945 (74 y.o. M) Treating RN: Huel Coventry Primary Care Physician: Etheleen Nicks Other Clinician: Referring Physician: Etheleen Nicks Treating Physician/Extender: Altamese [place] in Treatment: 1 Education Assessment Education Provided To: Patient Education Topics Provided Wound/Skin Impairment: Handouts: Caring for Your Ulcer Methods: Demonstration, Explain/Verbal Responses: State content correctly Electronic Signature(s) Signed: 07/27/2019 2:35:46 PM By: Elliot Gurney, BSN, RN, CWS, Kim RN, BSN Entered By: Elliot Gurney, BSN, RN, CWS, Kim on 07/27/2019 13:55:42 Scala, Jorje Guild ([id number]) -------------------------------------------------------------------------------- Wound Assessment Details Patient Name: [name]. Date of Service: 07/27/2019 1:45 PM Medical Record Number: [id number] Patient Account Number: [id number] Date of Birth/Sex: 1945/06/01 (74 y.o. M) Treating RN: Rodell Perna Primary Care Keyly Baldonado: Etheleen Nicks Other Clinician: Referring Deandra Gadson: Etheleen Nicks Treating Antione Obar/Extender: Altamese [place] in Treatment: 1 Wound Status Wound Number: [id number] Primary Etiology: Venous Leg Ulcer Wound Location: Right, Lateral Ankle Wound Status: Open Wounding Event: Blister Comorbid History: Anemia, Sleep Apnea, Hypertension, Osteoarthritis Date Acquired: 06/20/2019 Weeks Of Treatment: 1 Clustered Wound: No Photos Wound Measurements Length: (cm) 2.5 Width: (cm) 1.5 Depth: (cm) 0.2 Area: (cm) 2.945 Volume: (cm) 0.589 % Reduction in Area: 44.5% % Reduction in Volume: 44.5% Epithelialization: None Tunneling: No Undermining: No Wound Description Classification: Full  Thickness Without Exposed Support Structu Wound Margin: Flat and Intact Exudate Amount: Medium Exudate Type: Serous Exudate Color: amber res Foul Odor After Cleansing: No Slough/Fibrino Yes Wound Bed Granulation Amount: Medium (34-66%) Exposed Structure Granulation Quality: Pink Fascia Exposed: No Necrotic Amount: Medium (34-66%) Fat Layer (Subcutaneous Tissue) Exposed: Yes Necrotic Quality: Adherent Slough Tendon Exposed: No Muscle Exposed: No Joint Exposed: No Bone Exposed: No Treatment Notes Wound #7 (Right, Lateral Ankle) 1. Cleansed with: Cleanse wound with antibacterial soap and water 2. Anesthetic Topical Lidocaine 4% cream to wound bed prior to debridement Mcgourty, Keone R. ([id number]) 4. Dressing Applied: Iodoflex 5. Secondary Dressing Applied ABD Pad 7. Secured with 3 Layer Compression System - Right Lower Extremity Electronic Signature(s) Signed: 07/27/2019 2:19:10 PM By: Rodell Perna Entered By: Rodell Perna on 07/27/2019 13:47:36 Recker, Jorje Guild ([id number]) -------------------------------------------------------------------------------- Vitals Details Patient Name: [name]. Date of Service: 07/27/2019 1:45 PM Medical Record Number: [id number] Patient Account Number: [id number] Date of Birth/Sex: [birth year] (74 y.o. M) Treating RN: Huel Coventry Primary Care Rayann Jolley: Etheleen Nicks Other Clinician: Referring Ileah Falkenstein: Etheleen Nicks Treating Jonathin Heinicke/Extender: Altamese  in Treatment: 1 Vital Signs Time Taken: 13:35 Temperature (F): 98.2 Height (in): 69 Pulse (bpm): 60 Weight (lbs): 320 Respiratory Rate (breaths/min): 18 Body Mass Index (BMI): 47.3 Blood Pressure (mmHg): 126/55 Reference Range: 80 - 120 mg / dl Electronic Signature(s) Signed: 07/27/2019 4:40:20 PM By: Dayton Martes RCP, RRT, CHT Entered By: Dayton Martes on 07/27/2019 13:39:04

## 2019-08-10 ENCOUNTER — Other Ambulatory Visit: Payer: Self-pay

## 2019-08-10 ENCOUNTER — Encounter: Payer: Medicare Other | Admitting: Internal Medicine

## 2019-08-10 DIAGNOSIS — L97312 Non-pressure chronic ulcer of right ankle with fat layer exposed: Secondary | ICD-10-CM | POA: Diagnosis not present

## 2019-08-11 NOTE — Progress Notes (Signed)
[name], [name] ([id number]) Visit Report for 08/10/2019 Arrival Information Details Patient Name: [name], [name]. Date of Service: 08/10/2019 1:45 PM Medical Record Number: [id number] Patient Account Number: [id number] Date of Birth/Sex: 1945/05/02 (74 y.o. M) Treating RN: Huel Coventry Primary Care Jlyn Cerros: Etheleen Nicks Other Clinician: Referring Lacara Dunsworth: Etheleen Nicks Treating Iyahna Obriant/Extender: [facility] [place] in Treatment: 3 Visit Information History Since Last Visit Added or deleted any medications: No Patient Arrived: Crutches Any new allergies or adverse reactions: No Arrival Time: 13:48 Had a fall or experienced change in No Accompanied By: wife activities of daily living that may affect Transfer Assistance: None risk of falls: Patient Identification Verified: Yes Signs or symptoms of abuse/neglect since last visito No Secondary Verification Process Completed: Yes Hospitalized since last visit: No Implantable device outside of the clinic excluding No cellular tissue based products placed in the center since last visit: Has Dressing in Place as Prescribed: Yes Has Compression in Place as Prescribed: Yes Pain Present Now: No Electronic Signature(s) Signed: 08/10/2019 5:06:28 PM By: Dayton Martes RCP, RRT, CHT Entered By: Weyman Rodney, Lucio Edward on 08/10/2019 13:49:10 Boeke, Jorje Guild ([id number]) -------------------------------------------------------------------------------- Compression Therapy Details Patient Name: [name]. Date of Service: 08/10/2019 1:45 PM Medical Record Number: [id number] Patient Account Number: [id number] Date of Birth/Sex: 1945/12/03 (74 y.o. M) Treating RN: Huel Coventry Primary Care Drinda Belgard: Etheleen Nicks Other Clinician: Referring Benoit Meech: Etheleen Nicks Treating Dawon Troop/Extender: [facility]amese [place] in Treatment: 3 Compression Therapy Performed for Wound Assessment: Wound #7 Right,Lateral  Ankle Performed By: Clinician Huel Coventry, RN Compression Type: Three Layer Pre Treatment ABI: 1.2 Post Procedure Diagnosis Same as Pre-procedure Electronic Signature(s) Signed: 08/10/2019 6:25:04 PM By: Elliot Gurney, BSN, RN, CWS, Kim RN, BSN Entered By: Elliot Gurney, BSN, RN, CWS, Kim on 08/10/2019 14:09:20 [name] ([id number]) -------------------------------------------------------------------------------- Encounter Discharge Information Details Patient Name: [name]. Date of Service: 08/10/2019 1:45 PM Medical Record Number: [id number] Patient Account Number: [id number] Date of Birth/Sex: 12-Nov-1945 (74 y.o. M) Treating RN: Huel Coventry Primary Care Rogue Rafalski: Etheleen Nicks Other Clinician: Referring Val Farnam: Etheleen Nicks Treating Katiejo Gilroy/Extender: Altamese [place] in Treatment: 3 Encounter Discharge Information Items Discharge Condition: Stable Ambulatory Status: Ambulatory Discharge Destination: Home Transportation: Private Auto Accompanied By: spouse Schedule Follow-up Appointment: Yes Clinical Summary of Care: Electronic Signature(s) Signed: 08/10/2019 6:25:04 PM By: Elliot Gurney, BSN, RN, CWS, Kim RN, BSN Entered By: Elliot Gurney, BSN, RN, CWS, Kim on 08/10/2019 14:12:26 Flavell, Jorje Guild ([id number]) -------------------------------------------------------------------------------- Lower Extremity Assessment Details Patient Name: [name], [name] Date of Service: 08/10/2019 1:45 PM Medical Record Number: [id number] Patient Account Number: [id number] Date of Birth/Sex: Feb 26, 1945 (74 y.o. M) Treating RN: Rodell Perna Primary Care Sharyn Brilliant: Etheleen Nicks Other Clinician: Referring Keshawn Fiorito: Etheleen Nicks Treating Ameya Kutz/Extender: Altamese [place] in Treatment: 3 Edema Assessment Assessed: [Left: No] [Right: No] Edema: [Left: N] [Right: o] Calf Left: Right: Point of Measurement: 34 cm From Medial Instep cm 42 cm Ankle Left: Right: Point of Measurement: 12 cm  From Medial Instep cm 25 cm Vascular Assessment Pulses: Dorsalis Pedis Palpable: [Right:Yes] Electronic Signature(s) Signed: 08/10/2019 4:08:07 PM By: Rodell Perna Entered By: Rodell Perna on 08/10/2019 13:52:19 Mault, Bayron R. ([id number]) -------------------------------------------------------------------------------- Multi Wound Chart Details Patient Name: [name]. Date of Service: 08/10/2019 1:45 PM Medical Record Number: [id number] Patient Account Number: [id number] Date of Birth/Sex: 1945/03/09 (74 y.o. M) Treating RN: Huel Coventry Primary Care Sakshi Sermons: Etheleen Nicks Other Clinician: Referring Kylieann Eagles: Etheleen Nicks Treating Aahil Fredin/Extender: Altamese [place] in Treatment: 3 Vital Signs Height(in): 69  Pulse(bpm): 50 Weight(lbs): 320 Blood Pressure(mmHg): 149/66 Body Mass Index(BMI): 47 Temperature(F): 98.8 Respiratory Rate(breaths/min): 18 Photos: [N/A:N/A] Wound Location: Right, Lateral Ankle N/A N/A Wounding Event: Blister N/A N/A Primary Etiology: Venous Leg Ulcer N/A N/A Comorbid History: Anemia, Sleep Apnea, N/A N/A Hypertension, Osteoarthritis Date Acquired: 06/20/2019 N/A N/A Weeks of Treatment: 3 N/A N/A Wound Status: Open N/A N/A Measurements L x W x D (cm) 2.2x1.4x0.2 N/A N/A Area (cm) : 2.419 N/A N/A Volume (cm) : 0.484 N/A N/A % Reduction in Area: 54.40% N/A N/A % Reduction in Volume: 54.40% N/A N/A Classification: Full Thickness Without Exposed N/A N/A Support Structures Exudate Amount: Medium N/A N/A Exudate Type: Serous N/A N/A Exudate Color: amber N/A N/A Wound Margin: Flat and Intact N/A N/A Granulation Amount: Large (67-100%) N/A N/A Granulation Quality: Pink N/A N/A Necrotic Amount: Small (1-33%) N/A N/A Necrotic Tissue: Eschar, Adherent Slough N/A N/A Exposed Structures: Fat Layer (Subcutaneous Tissue) N/A N/A Exposed: Yes Fascia: No Tendon: No Muscle: No Joint: No Bone: No Epithelialization: None N/A  N/A Procedures Performed: Compression Therapy N/A N/A Treatment Notes Wound #7 (Right, Lateral Ankle) Notes hblue, abd, 3 layer right, unna to anchor KDEN, WAGSTER ([id number]) Electronic Signature(s) Signed: 08/10/2019 4:21:24 PM By: Baltazar Najjar MD Entered By: Baltazar Najjar on 08/10/2019 14:18:08 Lawhead, Jorje Guild ([id number]) -------------------------------------------------------------------------------- Multi-Disciplinary Care Plan Details Patient Name: [name]. Date of Service: 08/10/2019 1:45 PM Medical Record Number: [id number] Patient Account Number: [id number] Date of Birth/Sex: 03-01-1945 (74 y.o. M) Treating RN: Huel Coventry Primary Care Equilla Que: Etheleen Nicks Other Clinician: Referring Juliano Mceachin: Etheleen Nicks Treating Desirae Mancusi/Extender: Altamese [place] in Treatment: 3 Active Inactive Necrotic Tissue Nursing Diagnoses: Impaired tissue integrity related to necrotic/devitalized tissue Goals: Necrotic/devitalized tissue will be minimized in the wound bed Date Initiated: 07/20/2019 Target Resolution Date: 08/19/2019 Goal Status: Active Interventions: Assess patient pain level pre-, during and post procedure and prior to discharge Treatment Activities: Apply topical anesthetic as ordered : 07/20/2019 Notes: Orientation to the Wound Care Program Nursing Diagnoses: Knowledge deficit related to the wound healing center program Goals: Patient/caregiver will verbalize understanding of the Wound Healing Center Program Date Initiated: 07/20/2019 Target Resolution Date: 08/19/2019 Goal Status: Active Interventions: Provide education on orientation to the wound center Notes: Pain, Acute or Chronic Nursing Diagnoses: Pain Management - Non-cyclic Acute (Procedural) Goals: Patient will verbalize adequate pain control and receive pain control interventions during procedures as needed Date Initiated: 07/20/2019 Target Resolution Date: 08/19/2019 Goal Status:  Active Interventions: Assess comfort goal upon admission Notes: Venous Leg Ulcer Nursing Diagnoses: Knowledge deficit related to disease process and management Goals: Patient will maintain optimal edema control [name], [name] ([id number]) Date Initiated: 07/20/2019 Target Resolution Date: 08/19/2019 Goal Status: Active Interventions: Assess peripheral edema status every visit. Compression as ordered Treatment Activities: Therapeutic compression applied : 07/20/2019 Notes: Wound/Skin Impairment Nursing Diagnoses: Knowledge deficit related to ulceration/compromised skin integrity Goals: Ulcer/skin breakdown will have a volume reduction of 30% by week 4 Date Initiated: 07/20/2019 Target Resolution Date: 08/19/2019 Goal Status: Active Interventions: Assess patient/caregiver ability to obtain necessary supplies Provide education on ulcer and skin care Treatment Activities: Skin care regimen initiated : 07/20/2019 Notes: Electronic Signature(s) Signed: 08/10/2019 6:25:04 PM By: Elliot Gurney, BSN, RN, CWS, Kim RN, BSN Entered By: Elliot Gurney, BSN, RN, CWS, Kim on 08/10/2019 14:08:22 Buice, Jorje Guild ([id number]) -------------------------------------------------------------------------------- Pain Assessment Details Patient Name: [name] Pen R. Date of Service: 08/10/2019 1:45 PM Medical Record Number: [id number] Patient Account Number: [id number] Date of Birth/Sex: [birth year] (74 y.o. M) Treating  RN: Rodell Perna Primary Care Stevi Hollinshead: Etheleen Nicks Other Clinician: Referring Wrenn Willcox: Etheleen Nicks Treating Lazaro Isenhower/Extender: Altamese [place] in Treatment: 3 Active Problems Location of Pain Severity and Description of Pain Patient Has Paino No Site Locations Pain Management and Medication Current Pain Management: Electronic Signature(s) Signed: 08/10/2019 4:08:07 PM By: Rodell Perna Entered By: Rodell Perna on 08/10/2019 13:51:03 Prest, Jorje Guild  ([id number]) -------------------------------------------------------------------------------- Patient/Caregiver Education Details Patient Name: [name]. Date of Service: 08/10/2019 1:45 PM Medical Record Number: [id number] Patient Account Number: [id number] Date of Birth/Gender: 02-24-1945 (74 y.o. M) Treating RN: Huel Coventry Primary Care Physician: Etheleen Nicks Other Clinician: Referring Physician: Etheleen Nicks Treating Physician/Extender: Altamese [place] in Treatment: 3 Education Assessment Education Provided To: Patient Education Topics Provided Tissue Oxygenation: Venous: Handouts: Controlling Swelling with Multilayered Compression Wraps Methods: Demonstration, Explain/Verbal Responses: State content correctly Wound/Skin Impairment: Handouts: Caring for Your Ulcer, Other: continue wound care as prescribed Methods: Demonstration, Explain/Verbal Responses: State content correctly Electronic Signature(s) Signed: 08/10/2019 6:25:04 PM By: Elliot Gurney, BSN, RN, CWS, Kim RN, BSN Entered By: Elliot Gurney, BSN, RN, CWS, Kim on 08/10/2019 14:11:46 Langer, Jorje Guild ([id number]) -------------------------------------------------------------------------------- Wound Assessment Details Patient Name: [name] Pen R. Date of Service: 08/10/2019 1:45 PM Medical Record Number: [id number] Patient Account Number: [id number] Date of Birth/Sex: 1945/09/28 (74 y.o. M) Treating RN: Rodell Perna Primary Care Gervase Colberg: Etheleen Nicks Other Clinician: Referring Taurus Alamo: Etheleen Nicks Treating Rayshon Albaugh/Extender: Altamese [place] in Treatment: 3 Wound Status Wound Number: 7 Primary Etiology: Venous Leg Ulcer Wound Location: Right, Lateral Ankle Wound Status: Open Wounding Event: Blister Comorbid History: Anemia, Sleep Apnea, Hypertension, Osteoarthritis Date Acquired: 06/20/2019 Weeks Of Treatment: 3 Clustered Wound: No Photos Wound Measurements Length: (cm) 2.2 Width: (cm)  1.4 Depth: (cm) 0.2 Area: (cm) 2.419 Volume: (cm) 0.484 % Reduction in Area: 54.4% % Reduction in Volume: 54.4% Epithelialization: None Tunneling: No Undermining: No Wound Description Classification: Full Thickness Without Exposed Support Structu Wound Margin: Flat and Intact Exudate Amount: Medium Exudate Type: Serous Exudate Color: amber res Foul Odor After Cleansing: No Slough/Fibrino Yes Wound Bed Granulation Amount: Large (67-100%) Exposed Structure Granulation Quality: Pink Fascia Exposed: No Necrotic Amount: Small (1-33%) Fat Layer (Subcutaneous Tissue) Exposed: Yes Necrotic Quality: Eschar, Adherent Slough Tendon Exposed: No Muscle Exposed: No Joint Exposed: No Bone Exposed: No Treatment Notes Wound #7 (Right, Lateral Ankle) Notes hblue, abd, 3 layer right, unna to anchor Electronic Signature(s) Signed: 08/10/2019 4:08:07 PM By: Tanna Furry, Jorje Guild ([id number]) Entered By: Rodell Perna on 08/10/2019 13:52:02 Bettinger, Jorje Guild ([id number]) -------------------------------------------------------------------------------- Vitals Details Patient Name: [name]. Date of Service: 08/10/2019 1:45 PM Medical Record Number: [id number] Patient Account Number: [id number] Date of Birth/Sex: 1945/10/08 (74 y.o. M) Treating RN: Huel Coventry Primary Care Niccolo Burggraf: Etheleen Nicks Other Clinician: Referring Malori Myers: Etheleen Nicks Treating Mariesa Grieder/Extender: Altamese [place] in Treatment: 3 Vital Signs Time Taken: 13:45 Temperature (F): 98.8 Height (in): 69 Pulse (bpm): 50 Weight (lbs): 320 Respiratory Rate (breaths/min): 18 Body Mass Index (BMI): 47.3 Blood Pressure (mmHg): 149/66 Reference Range: 80 - 120 mg / dl Electronic Signature(s) Signed: 08/10/2019 5:06:28 PM By: Dayton Martes RCP, RRT, CHT Entered By: Dayton Martes on 08/10/2019 13:49:33

## 2019-08-11 NOTE — Progress Notes (Signed)
[name], [name] ([id number]) Visit Report for 08/10/2019 HPI Details Patient Name: [name], [name]. Date of Service: 08/10/2019 1:45 PM Medical Record Number: [id number] Patient Account Number: [id number] Date of Birth/Sex: 02/10/1945 (74 y.o. M) Treating RN: Huel Coventry Primary Care Provider: Etheleen Nicks Other Clinician: Referring Provider: Etheleen Nicks Treating Provider/Extender: Altamese [place] in Treatment: 3 History of Present Illness HPI Description: As noted above the patient had a injury due to working in the yard and this rapidly progressed to a ulcerated area with an infection. In October 2014 he did have some vascular studies done but never saw a Careers adviser and never had any surgery for varicose veins. His past medical history is significant for hypertension, obstructive sleep apnea, morbid obesity, stasis dermatitis of both lower extremities and his past surgical history is suggestive of hip surgery on the right, gastric bypass surgery, cholecystectomy, hip replacement. He has recently been put on Levaquin 500 milligrams daily, Bactrim DS 1 twice a day and Bactroban 2% topical ointment locally. No recent labs or vascular or imaging studies done. 07/11/2014 a venous duplex study was done on 07/05/2014 -- it showed no evidence of deep vein thrombosis or superficial thrombosis bilaterally. 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 The patient has an appointment to see the vascular surgeons on June 20. 07/18/2014 his appointment with the vascular surgeons is rescheduled for this afternoon. 07/25/2014 -- he saw the vascular surgeon and is going to have a endovenous procedure done on July 29. 08/01/2014 -- he was diagnosed with a UTI and is on ciprofloxacin for 10 days. readmission: 11/27/15 On evaluation today patient is having discomfort in the right ankle region following an open wound from having  dropped a cardboard box striking his ankle. He tells me that this has not healed despite many of the measures that he learned from previous injuries when he was seen at our wound center. Therefore he didn't come in for reevaluation today. He has not noted any significant purulent discharge at this point in time. 12/04/15 patient appears to be doing somewhat better at this point in time today in regard to his ankle wound. it is measuring smaller and looking cleaner. 12/11/15; patient wound continues to look improve this is on the right lateral ankle. Debrided of surface slough and nonviable subcutaneous tissue. Using silver alginate 12-18-15 [name] presents today with his wife for follow-up on right lower extremity venous ulcer. He denies any changes or complications over the past week. He is tolerating compression therapy to his right lower extremity and has a properly fitting compression hose to the left lower extremity. 12/25/15; patient with a wound on his right lateral lower extremity in the setting of severe venous insufficiency [originally trauma against the box]. His edema is well controlled he is using Prisma. 01/01/16; continued improvement using Prisma. He has severe venous insufficiency however the wound was originally trauma against a box. 01/08/16; his wound is totally epithelialized. We will give him measurements for graded pressure stockings which I think he is going to get at elastic therapy in [facility] Readmission: 10/30/17 patient presents today for reevaluation in our clinic although it has been since 2017 that we've last seen him. He has a ulcer on the right lateral ankle which he tells me began roughly 2 months ago. He states that he had one similar on the left ankle although this completely healed without any complication. However the one on the  right as continue to give him a lot of trouble and is not healing appropriately. He really was not sure what the best  thing to put on the area would be and therefore he's just been putting advantage to cover it. Another treatment has been utilized at this time. Fortunately there is not appear to be any evidence of significant infection necessarily although there is some evidence of abnormal discoloration in regard to the base of the wound that does not look as healthy as I was on the lives life. He does have erythema surrounding but I'm not sure if this is just due to chronic venous stasis/lymphedema or if it truly is anything infection wise going on at this point. Nonetheless in general the patient seems to be doing fairly well he's not having too much pain although he does have some discomfort. No fevers, chills, nausea, or vomiting noted at this time. 11/13/17 on evaluation today patient actually appears to be doing much better in regard to his wound currently. There does seem to be evidence of good improvement there still a lot of maceration and some necrotic tissue on the surface of the wound but this was cleaned away very well today. Overall I'm very pleased with how things seem to be progressing in general. We did get the results of his culture back which showed that he did have evidence of Staphylococcus aureus as well as group B strep. It does appear that the Bactrim was sensitive for both. The wound again does look better. 11/20/17 on evaluation today patient's right lateral ankle ulcer actually appears to be doing excellent at this time. He has been tolerating the dressing changes without complication. With that being said overall I do feel like that he has made great progress in the few weeks we've been seeing him at this point. No fevers, chills, nausea, or vomiting noted at this time. 11/30/17 on evaluation today patient appears to be doing extremely well at this point in regard to his right lateral ankle ulcer. He has been tolerating the dressing changes without complication. In general I've been very  pleased with the overall progress. His wound overview chart shows that he is making steady progress towards closure. RC, AMISON ([id number]) 12/07/17 on evaluation today patient actually appears to be doing well in regard to his ulcer. He does have somewhat of an irregular heart rate noted at this point this was confirmed by manual checks as well. However it was not as low as the initial reading by the machine which was around 48 he was closer to around 58-62. Nonetheless it does sound as if you may have a little bit of an irregularity possibly a field although it was somewhat slow and difficult to gauge the exact rhythm. Otherwise his wound itself seems to be showing signs of great improvement with good epithelialization. He's tolerating the Iodoflex well. 12/14/17 on evaluation today patient continues to show signs of great improvement in my pinion. Overall I'm very pleased with the progress that is made. With that being said though he has shown improvement he tells me that for the first couple of days after we put his wrap on that he has severe pain for pretty much that entire amount of time. Nonetheless at this time I'm not seeing any evidence of infection which is good news. I think this may actually be coming from the actual dressing itself. That is the Iodoflex. 12/22/17 on evaluation today patient actually appears to be doing much better in  regard to his ulcer. He's been shown signs of improvement which is great news. Fortunately there does not appear to be any evidence of infection at this time. Overall I feel like he has shown excellent progress even since just last week. 12/29/17 on evaluation today patient actually appears to be doing excellent although he does have some of the dressing material stuck to the wound bed. Fortunately there does not appear to be any signs of infection which is great news. Overall I'm very pleased with the progression of his wound. 01/05/18 on evaluation  today patient actually appears to be doing rather well in regard to his ankle ulcer. He has been tolerating the dressing changes without complication. Overall very pleased. He seems to be very close to healing. 01/14/18 on evaluation today patient actually appears to be doing excellent in regard to his right lateral lower extremity wound. He's been tolerating the dressing changes without complication. Fortunately he appears to be completely healed today. READMISSION 07/20/2019 This is a now 74 year old man that we have had on in this clinic on multiple occasions usually with a wound on the right lateral ankle in the setting of chronic venous hypertension. Looking at the notes from 2016 he had venous reflux studies saw vascular surgery and was supposed to have a procedure but I cannot see what procedure that was. He was last here in 2019 at which time he had an ulcer in this same area. He often describes minor trauma. On this occasion he simply saw 2 blisters one of the blisters opened and healed the other resulted in this wound he has not been using his juxta lite stockings in fact according to his wife he use these for perhaps 2 or 3 months after he was last here but has not used them since. He does keep his legs elevated. He is a minimal ambulator walking with crutches secondary to left hip issues. Past medical history is reviewed he has hypertension, obstructive sleep apnea on CPAP and O2, stasis dermatitis, hip history of a hip surgery, venous insufficiency with lymphedema. Urinary retention requiring a suprapubic catheter, panniculitis, status post Roux-en-Y gastric bypass ABIs in our clinic were 1.22 on the right and 1.17 on the left 6/30; right lateral ankle wound which is a recurrent problem in the setting of chronic venous hypertension. He had a procedure in 2016 I will need to look this up. We have been using Iodoflex under the wound under compression. No evidence of arterial  insufficiency 08/02/2019 on evaluation today patient's wound actually showed signs of significant improvement which is great news. The Iodoflex does seem to be doing a great job keeping the necrotic tissue under control and in fact I was able to mechanically clean this away today without the use of sharp debridement which the patient tolerated in an excellent fashion. There is no evidence of infection currently. 7/14; the patient is returned from [place] on vacation. His wound actually looks somewhat better. He has been using Iodoflex under compression Electronic Signature(s) Signed: 08/10/2019 4:21:24 PM By: Baltazar Najjar MD Entered By: Baltazar Najjar on 08/10/2019 14:18:57 [name], [name] ([id number]) -------------------------------------------------------------------------------- Physical Exam Details Patient Name: [name]. Date of Service: 08/10/2019 1:45 PM Medical Record Number: [id number] Patient Account Number: [id number] Date of Birth/Sex: 05-16-1945 (74 y.o. M) Treating RN: Huel Coventry Primary Care Provider: Etheleen Nicks Other Clinician: Referring Provider: Etheleen Nicks Treating Provider/Extender: Altamese [place] in Treatment: 3 Constitutional Patient is hypertensive.. Pulse regular and within target range for  patient.Marland Kitchen Respirations regular, non-labored and within target range.. Temperature is normal and within the target range for the patient.Marland Kitchen appears in no distress. Cardiovascular Pedal pulses are palpable. Notes Wound exam much better looking surface still minimal amount of slough. Pedal pulses are palpable. His edema chronic venous insufficiency is under control. Electronic Signature(s) Signed: 08/10/2019 4:21:24 PM By: Baltazar Najjar MD Entered By: Baltazar Najjar on 08/10/2019 14:20:50 [name], [name] ([id number]) -------------------------------------------------------------------------------- Physician Orders Details Patient Name: [name]. Date of Service: 08/10/2019 1:45 PM Medical Record Number: [id number] Patient Account Number: [id number] Date of Birth/Sex: [birth year] (74 y.o. M) Treating RN: Huel Coventry Primary Care Provider: Etheleen Nicks Other Clinician: Referring Provider: Etheleen Nicks Treating Provider/Extender: Altamese [place] in Treatment: 3 Verbal / Phone Orders: No Diagnosis Coding Wound Cleansing Wound #7 Right,Lateral Ankle o Cleanse wound with mild soap and water o May Shower, gently pat wound dry prior to applying new dressing. Anesthetic (add to Medication List) Wound #7 Right,Lateral Ankle o Topical Lidocaine 4% cream applied to wound bed prior to debridement (In Clinic Only). Primary Wound Dressing Wound #7 Right,Lateral Ankle o Hydrafera Blue Ready Transfer Secondary Dressing Wound #7 Right,Lateral Ankle o ABD pad Dressing Change Frequency Wound #7 Right,Lateral Ankle o Change dressing every week Follow-up Appointments Wound #7 Right,Lateral Ankle o Return Appointment in 1 week. o Nurse Visit as needed Edema Control Wound #7 Right,Lateral Ankle o 3 Layer Compression System - Right Lower Extremity - unna paste to anchor o Patient to wear own Velcro compression garment. - left o Elevate legs to the level of the heart and pump ankles as often as possible Additional Orders / Instructions Wound #7 Right,Lateral Ankle o Increase protein intake. o Activity as tolerated Electronic Signature(s) Signed: 08/10/2019 4:21:24 PM By: Baltazar Najjar MD Signed: 08/10/2019 6:25:04 PM By: Elliot Gurney, BSN, RN, CWS, Kim RN, BSN Entered By: Elliot Gurney, BSN, RN, CWS, Kim on 08/10/2019 14:10:40 [name], [name] ([id number]) -------------------------------------------------------------------------------- Problem List Details Patient Name: JAEDEN, MESSER. Date of Service: 08/10/2019 1:45 PM Medical Record Number: [id number] Patient Account Number: [id number] Date of Birth/Sex:  [birth year] (74 y.o. M) Treating RN: Huel Coventry Primary Care Provider: Etheleen Nicks Other Clinician: Referring Provider: Etheleen Nicks Treating Provider/Extender: Altamese [place] in Treatment: 3 Active Problems ICD-10 Encounter Code Description Active Date MDM Diagnosis I87.331 Chronic venous hypertension (idiopathic) with ulcer and inflammation of 07/20/2019 No Yes right lower extremity L97.312 Non-pressure chronic ulcer of right ankle with fat layer exposed 07/20/2019 No Yes Inactive Problems Resolved Problems Electronic Signature(s) Signed: 08/10/2019 4:21:24 PM By: Baltazar Najjar MD Entered By: Baltazar Najjar on 08/10/2019 14:17:58 [name], [name] ([id number]) -------------------------------------------------------------------------------- Progress Note Details Patient Name: [name]. Date of Service: 08/10/2019 1:45 PM Medical Record Number: [id number] Patient Account Number: [id number] Date of Birth/Sex: July 28, 1945 (74 y.o. M) Treating RN: Huel Coventry Primary Care Provider: Etheleen Nicks Other Clinician: Referring Provider: Etheleen Nicks Treating Provider/Extender: Altamese  in Treatment: 3 Subjective History of Present Illness (HPI) As noted above the patient had a injury due to working in the yard and this rapidly progressed to a ulcerated area with an infection. In October 2014 he did have some vascular studies done but never saw a Careers adviser and never had any surgery for varicose veins. His past medical history is significant for hypertension, obstructive sleep apnea, morbid obesity, stasis dermatitis of both lower extremities and his past surgical history is suggestive of hip surgery on the right, gastric bypass surgery, cholecystectomy, hip replacement. He  has recently been put on Levaquin 500 milligrams daily, Bactrim DS 1 twice a day and Bactroban 2% topical ointment locally. No recent labs or vascular or imaging studies done. 07/11/2014  a venous duplex study was done on 07/05/2014 -- it showed no evidence of deep vein thrombosis or superficial thrombosis bilaterally. 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 The patient has an appointment to see the vascular surgeons on June 20. 07/18/2014 his appointment with the vascular surgeons is rescheduled for this afternoon. 07/25/2014 -- he saw the vascular surgeon and is going to have a endovenous procedure done on July 29. 08/01/2014 -- he was diagnosed with a UTI and is on ciprofloxacin for 10 days. readmission: 11/27/15 On evaluation today patient is having discomfort in the right ankle region following an open wound from having dropped a cardboard box striking his ankle. He tells me that this has not healed despite many of the measures that he learned from previous injuries when he was seen at our wound center. Therefore he didn't come in for reevaluation today. He has not noted any significant purulent discharge at this point in time. 12/04/15 patient appears to be doing somewhat better at this point in time today in regard to his ankle wound. it is measuring smaller and looking cleaner. 12/11/15; patient wound continues to look improve this is on the right lateral ankle. Debrided of surface slough and nonviable subcutaneous tissue. Using silver alginate 12-18-15 [name] presents today with his wife for follow-up on right lower extremity venous ulcer. He denies any changes or complications over the past week. He is tolerating compression therapy to his right lower extremity and has a properly fitting compression hose to the left lower extremity. 12/25/15; patient with a wound on his right lateral lower extremity in the setting of severe venous insufficiency [originally trauma against the box]. His edema is well controlled he is using Prisma. 01/01/16; continued improvement using Prisma. He has severe  venous insufficiency however the wound was originally trauma against a box. 01/08/16; his wound is totally epithelialized. We will give him measurements for graded pressure stockings which I think he is going to get at elastic therapy in [facility] Readmission: 10/30/17 patient presents today for reevaluation in our clinic although it has been since 2017 that we've last seen him. He has a ulcer on the right lateral ankle which he tells me began roughly 2 months ago. He states that he had one similar on the left ankle although this completely healed without any complication. However the one on the right as continue to give him a lot of trouble and is not healing appropriately. He really was not sure what the best thing to put on the area would be and therefore he's just been putting advantage to cover it. Another treatment has been utilized at this time. Fortunately there is not appear to be any evidence of significant infection necessarily although there is some evidence of abnormal discoloration in regard to the base of the wound that does not look as healthy as I was on the lives life. He does have erythema surrounding but I'm not sure if this is just due to chronic venous stasis/lymphedema or if it truly is anything infection wise going on at this point. Nonetheless in general the patient seems to be doing fairly well he's not having too much pain although he does have some discomfort. No fevers, chills, nausea, or vomiting noted  at this time. 11/13/17 on evaluation today patient actually appears to be doing much better in regard to his wound currently. There does seem to be evidence of good improvement there still a lot of maceration and some necrotic tissue on the surface of the wound but this was cleaned away very well today. Overall I'm very pleased with how things seem to be progressing in general. We did get the results of his culture back which showed that he did have evidence of  Staphylococcus aureus as well as group B strep. It does appear that the Bactrim was sensitive for both. The wound again does look better. 11/20/17 on evaluation today patient's right lateral ankle ulcer actually appears to be doing excellent at this time. He has been tolerating the dressing changes without complication. With that being said overall I do feel like that he has made great progress in the few weeks we've been seeing him at this point. No fevers, chills, nausea, or vomiting noted at this time. 11/30/17 on evaluation today patient appears to be doing extremely well at this point in regard to his right lateral ankle ulcer. He has been tolerating the dressing changes without complication. In general I've been very pleased with the overall progress. His wound overview chart shows that he is making steady progress towards closure. 12/07/17 on evaluation today patient actually appears to be doing well in regard to his ulcer. He does have somewhat of an irregular heart rate noted at this point this was confirmed by manual checks as well. However it was not as low as the initial reading by the machine which was around 48 he was closer to around 58-62. Nonetheless it does sound as if you may have a little bit of an irregularity possibly a field although it was somewhat slow and difficult to gauge the exact rhythm. Otherwise his wound itself seems to be showing signs of great improvement with good epithelialization. He's tolerating the Iodoflex well. [name], [name] ([id number]) 12/14/17 on evaluation today patient continues to show signs of great improvement in my pinion. Overall I'm very pleased with the progress that is made. With that being said though he has shown improvement he tells me that for the first couple of days after we put his wrap on that he has severe pain for pretty much that entire amount of time. Nonetheless at this time I'm not seeing any evidence of infection which is good  news. I think this may actually be coming from the actual dressing itself. That is the Iodoflex. 12/22/17 on evaluation today patient actually appears to be doing much better in regard to his ulcer. He's been shown signs of improvement which is great news. Fortunately there does not appear to be any evidence of infection at this time. Overall I feel like he has shown excellent progress even since just last week. 12/29/17 on evaluation today patient actually appears to be doing excellent although he does have some of the dressing material stuck to the wound bed. Fortunately there does not appear to be any signs of infection which is great news. Overall I'm very pleased with the progression of his wound. 01/05/18 on evaluation today patient actually appears to be doing rather well in regard to his ankle ulcer. He has been tolerating the dressing changes without complication. Overall very pleased. He seems to be very close to healing. 01/14/18 on evaluation today patient actually appears to be doing excellent in regard to his right lateral lower extremity wound. He's been  tolerating the dressing changes without complication. Fortunately he appears to be completely healed today. READMISSION 07/20/2019 This is a now 74 year old man that we have had on in this clinic on multiple occasions usually with a wound on the right lateral ankle in the setting of chronic venous hypertension. Looking at the notes from 2016 he had venous reflux studies saw vascular surgery and was supposed to have a procedure but I cannot see what procedure that was. He was last here in 2019 at which time he had an ulcer in this same area. He often describes minor trauma. On this occasion he simply saw 2 blisters one of the blisters opened and healed the other resulted in this wound he has not been using his juxta lite stockings in fact according to his wife he use these for perhaps 2 or 3 months after he was last here but has not  used them since. He does keep his legs elevated. He is a minimal ambulator walking with crutches secondary to left hip issues. Past medical history is reviewed he has hypertension, obstructive sleep apnea on CPAP and O2, stasis dermatitis, hip history of a hip surgery, venous insufficiency with lymphedema. Urinary retention requiring a suprapubic catheter, panniculitis, status post Roux-en-Y gastric bypass ABIs in our clinic were 1.22 on the right and 1.17 on the left 6/30; right lateral ankle wound which is a recurrent problem in the setting of chronic venous hypertension. He had a procedure in 2016 I will need to look this up. We have been using Iodoflex under the wound under compression. No evidence of arterial insufficiency 08/02/2019 on evaluation today patient's wound actually showed signs of significant improvement which is great news. The Iodoflex does seem to be doing a great job keeping the necrotic tissue under control and in fact I was able to mechanically clean this away today without the use of sharp debridement which the patient tolerated in an excellent fashion. There is no evidence of infection currently. 7/14; the patient is returned from [place] on vacation. His wound actually looks somewhat better. He has been using Iodoflex under compression Objective Constitutional Patient is hypertensive.. Pulse regular and within target range for patient.Marland Kitchen Respirations regular, non-labored and within target range.. Temperature is normal and within the target range for the patient.Marland Kitchen appears in no distress. Vitals Time Taken: 1:45 PM, Height: 69 in, Weight: 320 lbs, BMI: 47.3, Temperature: 98.8 F, Pulse: 50 bpm, Respiratory Rate: 18 breaths/min, Blood Pressure: 149/66 mmHg. Cardiovascular Pedal pulses are palpable. General Notes: Wound exam much better looking surface still minimal amount of slough. Pedal pulses are palpable. His edema chronic venous insufficiency is under  control. Integumentary (Hair, Skin) Wound #7 status is Open. Original cause of wound was Blister. The wound is located on the Right,Lateral Ankle. The wound measures 2.2cm length x 1.4cm width x 0.2cm depth; 2.419cm^2 area and 0.484cm^3 volume. There is Fat Layer (Subcutaneous Tissue) Exposed exposed. There is no tunneling or undermining noted. There is a medium amount of serous drainage noted. The wound margin is flat and intact. There is large (67-100%) pink granulation within the wound bed. There is a small (1-33%) amount of necrotic tissue within the wound bed including Eschar and Adherent Slough. 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  There was a Three Layer Compression Therapy Procedure with a pre-treatment ABI of 1.2 by Huel Coventry, RN. Post procedure Diagnosis Wound #7: Same as Pre-Procedure Plan Wound Cleansing: Wound #7 Right,Lateral Ankle: Cleanse wound with mild soap and water May Shower, gently pat wound dry prior to applying new dressing. Anesthetic (add to Medication List): Wound #7 Right,Lateral Ankle: Topical Lidocaine 4% cream applied to wound bed prior to debridement (In Clinic Only). Primary Wound Dressing: Wound #7 Right,Lateral Ankle: Hydrafera Blue Ready Transfer Secondary Dressing: Wound #7 Right,Lateral Ankle: ABD pad Dressing Change Frequency: Wound #7 Right,Lateral Ankle: Change dressing every week Follow-up Appointments: Wound #7 Right,Lateral Ankle: Return Appointment in 1 week. Nurse Visit as needed Edema Control: Wound #7 Right,Lateral Ankle: 3 Layer Compression System - Right Lower Extremity - unna paste to anchor Patient to wear own Velcro compression  garment. - left Elevate legs to the level of the heart and pump ankles as often as possible Additional Orders / Instructions: Wound #7 Right,Lateral Ankle: Increase protein intake. Activity as tolerated 1. I change the primary dressing to Hydrofera Blue under 3 layer compression 2. We have good edema control. Electronic Signature(s) Signed: 08/10/2019 4:21:24 PM By: Baltazar Najjar MD Entered By: Baltazar Najjar on 08/10/2019 14:21:57 [name], [name] ([id number]) -------------------------------------------------------------------------------- SuperBill Details Patient Name: [name]. Date of Service: 08/10/2019 Medical Record Number: [id number] Patient Account Number: [id number] Date of Birth/Sex: 05-01-1945 (74 y.o. M) Treating RN: Huel Coventry Primary Care Provider: Etheleen Nicks Other Clinician: Referring Provider: Etheleen Nicks Treating Provider/Extender: Altamese [place] in Treatment: 3 Diagnosis Coding ICD-10 Codes Code Description [phone] Chronic venous hypertension (idiopathic) with ulcer and inflammation of right lower extremity L97.312 Non-pressure chronic ulcer of right ankle with fat layer exposed Facility Procedures CPT4 Code: 21308657 Description: (Facility Use Only) [phone] - APPLY MULTLAY COMPRS LWR RT LEG Modifier: Quantity: 1 Physician Procedures CPT4 Code Description: [id number] - WC PHYS LEVEL 3 - EST PT Modifier: Quantity: 1 CPT4 Code Description: ICD-10 Diagnosis Description I87.331 Chronic venous hypertension (idiopathic) with ulcer and inflammation of L97.312 Non-pressure chronic ulcer of right ankle with fat layer exposed Modifier: right lower extremit Quantity: y Psychologist, prison and probation services) Signed: 08/10/2019 4:21:24 PM By: Baltazar Najjar MD Entered By: Baltazar Najjar on 08/10/2019 14:22:20

## 2019-08-17 ENCOUNTER — Other Ambulatory Visit: Payer: Self-pay

## 2019-08-17 ENCOUNTER — Encounter: Payer: Medicare Other | Admitting: Internal Medicine

## 2019-08-17 DIAGNOSIS — L97312 Non-pressure chronic ulcer of right ankle with fat layer exposed: Secondary | ICD-10-CM | POA: Diagnosis not present

## 2019-08-19 NOTE — Progress Notes (Signed)
[name], [name] ([id number]) Visit Report for 08/17/2019 Arrival Information Details Patient Name: [name], [name]. Date of Service: 08/17/2019 11:15 AM Medical Record Number: [id number] Patient Account Number: [id number] Date of Birth/Sex: 08/18/1945 (74 y.o. M) Treating RN: Huel Coventry Primary Care Julanne Schlueter: Etheleen Nicks Other Clinician: Referring Trason Shifflet: Etheleen Nicks Treating Riata Ikeda/Extender: Altamese [place] in Treatment: 4 Visit Information History Since Last Visit Added or deleted any medications: No Patient Arrived: Crutches Any new allergies or adverse reactions: No Arrival Time: 11:09 Had a fall or experienced change in No Accompanied By: wife activities of daily living that may affect Transfer Assistance: None risk of falls: Patient Identification Verified: Yes Signs or symptoms of abuse/neglect since last visito No Secondary Verification Process Completed: Yes Hospitalized since last visit: No Implantable device outside of the clinic excluding No cellular tissue based products placed in the center since last visit: Has Dressing in Place as Prescribed: Yes Has Compression in Place as Prescribed: Yes Pain Present Now: No Electronic Signature(s) Signed: 08/17/2019 12:15:29 PM By: Dayton Martes RCP, RRT, CHT Entered By: Dayton Martes on 08/17/2019 11:11:29 Kates, Jorje Guild ([id number]) -------------------------------------------------------------------------------- Encounter Discharge Information Details Patient Name: [name]. Date of Service: 08/17/2019 11:15 AM Medical Record Number: [id number] Patient Account Number: [id number] Date of Birth/Sex: 1945/04/01 (74 y.o. M) Treating RN: Huel Coventry Primary Care Toluwani Yadav: Etheleen Nicks Other Clinician: Referring Eustacio Ellen: Etheleen Nicks Treating Lettie Czarnecki/Extender: Altamese [place] in Treatment: 4 Encounter Discharge Information Items Post Procedure  Vitals Discharge Condition: Stable Temperature (F): 98.2 Ambulatory Status: Crutches Pulse (bpm): 61 Discharge Destination: Home Respiratory Rate (breaths/min): 18 Transportation: Private Auto Blood Pressure (mmHg): 131/58 Accompanied By: self Schedule Follow-up Appointment: Yes Clinical Summary of Care: Electronic Signature(s) Signed: 08/18/2019 6:25:29 PM By: Elliot Gurney, BSN, RN, CWS, Kim RN, BSN Entered By: Elliot Gurney, BSN, RN, CWS, Kim on 08/17/2019 11:27:16 Dagostino, Jorje Guild ([id number]) -------------------------------------------------------------------------------- Lower Extremity Assessment Details Patient Name: ELMOR, KOST R. Date of Service: 08/17/2019 11:15 AM Medical Record Number: [id number] Patient Account Number: [id number] Date of Birth/Sex: 1945/08/30 (74 y.o. M) Treating RN: Rodell Perna Primary Care Monserat Prestigiacomo: Etheleen Nicks Other Clinician: Referring Elford Evilsizer: Etheleen Nicks Treating Laneya Gasaway/Extender: Altamese Woodruff in Treatment: 4 Edema Assessment Assessed: [Left: No] [Right: No] Edema: [Left: Ye] [Right: s] Calf Left: Right: Point of Measurement: 34 cm From Medial Instep cm 42 cm Ankle Left: Right: Point of Measurement: 12 cm From Medial Instep cm 25 cm Vascular Assessment Pulses: Dorsalis Pedis Palpable: [Right:Yes] Electronic Signature(s) Signed: 08/17/2019 4:24:58 PM By: Rodell Perna Entered By: Rodell Perna on 08/17/2019 11:17:32 Posada, Devell R. ([id number]) -------------------------------------------------------------------------------- Multi Wound Chart Details Patient Name: [name]. Date of Service: 08/17/2019 11:15 AM Medical Record Number: [id number] Patient Account Number: [id number] Date of Birth/Sex: 1945/05/25 (74 y.o. M) Treating RN: Huel Coventry Primary Care Jassiah Viviano: Etheleen Nicks Other Clinician: Referring Jeanclaude Wentworth: Etheleen Nicks Treating Dandrae Kustra/Extender: Altamese Ophir in Treatment: 4 Vital Signs Height(in):  69 Pulse(bpm): 61 Weight(lbs): 320 Blood Pressure(mmHg): 131/58 Body Mass Index(BMI): 47 Temperature(F): 98.2 Respiratory Rate(breaths/min): 18 Photos: [N/A:N/A] Wound Location: Right, Lateral Ankle N/A N/A Wounding Event: Blister N/A N/A Primary Etiology: Venous Leg Ulcer N/A N/A Comorbid History: Anemia, Sleep Apnea, N/A N/A Hypertension, Osteoarthritis Date Acquired: 06/20/2019 N/A N/A Weeks of Treatment: 4 N/A N/A Wound Status: Open N/A N/A Measurements L x W x D (cm) 2x1.3x0.2 N/A N/A Area (cm) : 2.042 N/A N/A Volume (cm) : 0.408 N/A N/A % Reduction in Area: 61.50% N/A N/A % Reduction in Volume:  61.60% N/A N/A Classification: Full Thickness Without Exposed N/A N/A Support Structures Exudate Amount: Medium N/A N/A Exudate Type: Serous N/A N/A Exudate Color: amber N/A N/A Wound Margin: Flat and Intact N/A N/A Granulation Amount: Large (67-100%) N/A N/A Granulation Quality: Pink N/A N/A Necrotic Amount: Small (1-33%) N/A N/A Necrotic Tissue: Eschar, Adherent Slough N/A N/A Exposed Structures: Fat Layer (Subcutaneous Tissue) N/A N/A Exposed: Yes Fascia: No Tendon: No Muscle: No Joint: No Bone: No Epithelialization: None N/A N/A Debridement: Debridement - Excisional N/A N/A Pre-procedure Verification/Time 11:23 N/A N/A Out Taken: Pain Control: Lidocaine N/A N/A Tissue Debrided: Callus, Subcutaneous, Slough N/A N/A Level: Skin/Subcutaneous Tissue N/A N/A Debridement Area (sq cm): 2.6 N/A N/A Instrument: Curette N/A N/A Bleeding: Moderate N/A N/A Hemostasis Achieved: Pressure N/A N/A Procedure was tolerated well N/A N/A Caggiano, Jacub R. ([id number]) Debridement Treatment Response: Post Debridement 2x1.3x0.3 N/A N/A Measurements L x W x D (cm) Post Debridement Volume: 0.613 N/A N/A (cm) Procedures Performed: Debridement N/A N/A Treatment Notes Wound #7 (Right, Lateral Ankle) Notes hblue, abd, 3 layer right, unna to anchor Electronic Signature(s) Signed:  08/17/2019 4:41:04 PM By: Baltazar Najjar MD Entered By: Baltazar Najjar on 08/17/2019 11:30:16 Bossier, Jorje Guild ([id number]) -------------------------------------------------------------------------------- Multi-Disciplinary Care Plan Details Patient Name: [name]. Date of Service: 08/17/2019 11:15 AM Medical Record Number: [id number] Patient Account Number: [id number] Date of Birth/Sex: November 15, 1945 (74 y.o. M) Treating RN: Huel Coventry Primary Care Cheree Fowles: Etheleen Nicks Other Clinician: Referring Adilenne Ashworth: Etheleen Nicks Treating Roshanda Balazs/Extender: Altamese [place] in Treatment: 4 Active Inactive Necrotic Tissue Nursing Diagnoses: Impaired tissue integrity related to necrotic/devitalized tissue Goals: Necrotic/devitalized tissue will be minimized in the wound bed Date Initiated: 07/20/2019 Target Resolution Date: 08/19/2019 Goal Status: Active Interventions: Assess patient pain level pre-, during and post procedure and prior to discharge Treatment Activities: Apply topical anesthetic as ordered : 07/20/2019 Notes: Orientation to the Wound Care Program Nursing Diagnoses: Knowledge deficit related to the wound healing center program Goals: Patient/caregiver will verbalize understanding of the Wound Healing Center Program Date Initiated: 07/20/2019 Target Resolution Date: 08/19/2019 Goal Status: Active Interventions: Provide education on orientation to the wound center Notes: Pain, Acute or Chronic Nursing Diagnoses: Pain Management - Non-cyclic Acute (Procedural) Goals: Patient will verbalize adequate pain control and receive pain control interventions during procedures as needed Date Initiated: 07/20/2019 Target Resolution Date: 08/19/2019 Goal Status: Active Interventions: Assess comfort goal upon admission Notes: Venous Leg Ulcer Nursing Diagnoses: Knowledge deficit related to disease process and management Goals: Patient will maintain optimal edema  control MAKARIOS, MADLOCK ([id number]) Date Initiated: 07/20/2019 Target Resolution Date: 08/19/2019 Goal Status: Active Interventions: Assess peripheral edema status every visit. Compression as ordered Treatment Activities: Therapeutic compression applied : 07/20/2019 Notes: Wound/Skin Impairment Nursing Diagnoses: Knowledge deficit related to ulceration/compromised skin integrity Goals: Ulcer/skin breakdown will have a volume reduction of 30% by week 4 Date Initiated: 07/20/2019 Target Resolution Date: 08/19/2019 Goal Status: Active Interventions: Assess patient/caregiver ability to obtain necessary supplies Provide education on ulcer and skin care Treatment Activities: Skin care regimen initiated : 07/20/2019 Notes: Electronic Signature(s) Signed: 08/18/2019 6:25:29 PM By: Elliot Gurney, BSN, RN, CWS, Kim RN, BSN Entered By: Elliot Gurney, BSN, RN, CWS, Kim on 08/17/2019 11:22:27 Kissinger, Jorje Guild ([id number]) -------------------------------------------------------------------------------- Pain Assessment Details Patient Name: [name] Date of Service: 08/17/2019 11:15 AM Medical Record Number: [id number] Patient Account Number: [id number] Date of Birth/Sex: [birth year] (74 y.o. M) Treating RN: Rodell Perna Primary Care Bryleigh Ottaway: Etheleen Nicks Other Clinician: Referring Orrie Schubert: Etheleen Nicks Treating Mehul Rudin/Extender: Baltazar Najjar  G Weeks in Treatment: 4 Active Problems Location of Pain Severity and Description of Pain Patient Has Paino No Site Locations Pain Management and Medication Current Pain Management: Electronic Signature(s) Signed: 08/17/2019 4:24:58 PM By: Rodell Perna Entered By: Rodell Perna on 08/17/2019 11:13:24 Dowen, Jorje Guild ([id number]) -------------------------------------------------------------------------------- Patient/Caregiver Education Details Patient Name: [name]. Date of Service: 08/17/2019 11:15 AM Medical Record Number:  [id number] Patient Account Number: [id number] Date of Birth/Gender: 1945/05/20 (74 y.o. M) Treating RN: Huel Coventry Primary Care Physician: Etheleen Nicks Other Clinician: Referring Physician: Etheleen Nicks Treating Physician/Extender: Altamese [place] in Treatment: 4 Education Assessment Education Provided To: Patient Education Topics Provided Wound/Skin Impairment: Handouts: Caring for Your Ulcer Methods: Demonstration, Explain/Verbal Responses: State content correctly Electronic Signature(s) Signed: 08/18/2019 6:25:29 PM By: Elliot Gurney, BSN, RN, CWS, Kim RN, BSN Entered By: Elliot Gurney, BSN, RN, CWS, Kim on 08/17/2019 11:26:09 [name] ([id number]) -------------------------------------------------------------------------------- Wound Assessment Details Patient Name: [name]. Date of Service: 08/17/2019 11:15 AM Medical Record Number: [id number] Patient Account Number: [id number] Date of Birth/Sex: January 30, 1945 (74 y.o. M) Treating RN: Rodell Perna Primary Care Corri Delapaz: Etheleen Nicks Other Clinician: Referring Elenna Spratling: Etheleen Nicks Treating Bernie Ransford/Extender: Altamese [place] in Treatment: 4 Wound Status Wound Number: 7 Primary Etiology: Venous Leg Ulcer Wound Location: Right, Lateral Ankle Wound Status: Open Wounding Event: Blister Comorbid History: Anemia, Sleep Apnea, Hypertension, Osteoarthritis Date Acquired: 06/20/2019 Weeks Of Treatment: 4 Clustered Wound: No Photos Wound Measurements Length: (cm) 2 Width: (cm) 1.3 Depth: (cm) 0.2 Area: (cm) 2.042 Volume: (cm) 0.408 % Reduction in Area: 61.5% % Reduction in Volume: 61.6% Epithelialization: None Wound Description Classification: Full Thickness Without Exposed Support Structu Wound Margin: Flat and Intact Exudate Amount: Medium Exudate Type: Serous Exudate Color: amber res Foul Odor After Cleansing: No Slough/Fibrino Yes Wound Bed Granulation Amount: Large (67-100%) Exposed  Structure Granulation Quality: Pink Fascia Exposed: No Necrotic Amount: Small (1-33%) Fat Layer (Subcutaneous Tissue) Exposed: Yes Necrotic Quality: Eschar, Adherent Slough Tendon Exposed: No Muscle Exposed: No Joint Exposed: No Bone Exposed: No Treatment Notes Wound #7 (Right, Lateral Ankle) Notes hblue, abd, 3 layer right, unna to anchor Electronic Signature(s) Signed: 08/17/2019 4:24:58 PM By: Tanna Furry, Jorje Guild ([id number]) Entered By: Rodell Perna on 08/17/2019 11:17:16 Kohli, Jorje Guild ([id number]) -------------------------------------------------------------------------------- Vitals Details Patient Name: [name]. Date of Service: 08/17/2019 11:15 AM Medical Record Number: [id number] Patient Account Number: [id number] Date of Birth/Sex: Dec 30, 1945 (74 y.o. M) Treating RN: Huel Coventry Primary Care Crawford Tamura: Etheleen Nicks Other Clinician: Referring Jaryn Rosko: Etheleen Nicks Treating Annalysa Mohammad/Extender: Altamese [place] in Treatment: 4 Vital Signs Time Taken: 11:10 Temperature (F): 98.2 Height (in): 69 Pulse (bpm): 61 Weight (lbs): 320 Respiratory Rate (breaths/min): 18 Body Mass Index (BMI): 47.3 Blood Pressure (mmHg): 131/58 Reference Range: 80 - 120 mg / dl Electronic Signature(s) Signed: 08/17/2019 12:15:29 PM By: Dayton Martes RCP, RRT, CHT Entered By: Dayton Martes on 08/17/2019 11:11:57

## 2019-08-19 NOTE — Progress Notes (Signed)
[name], [name] ([id number]) Visit Report for 08/17/2019 Debridement Details Patient Name: [name], [name]. Date of Service: 08/17/2019 11:15 AM Medical Record Number: [id number] Patient Account Number: [id number] Date of Birth/Sex: 15-Aug-1945 (74 y.o. M) Treating RN: Huel Coventry Primary Care Provider: Etheleen Nicks Other Clinician: Referring Provider: Etheleen Nicks Treating Provider/Extender: Altamese [place] in Treatment: 4 Debridement Performed for Wound #7 Right,Lateral Ankle Assessment: Performed By: Physician Maxwell Caul, MD Debridement Type: Debridement Severity of Tissue Pre Debridement: Fat layer exposed Level of Consciousness (Pre- Awake and Alert procedure): Pre-procedure Verification/Time Out Yes - 11:23 Taken: Start Time: 11:23 Pain Control: Lidocaine Total Area Debrided (L x W): 2 (cm) x 1.3 (cm) = 2.6 (cm) Tissue and other material Viable, Non-Viable, Callus, Slough, Subcutaneous, Skin: Dermis , Slough debrided: Level: Skin/Subcutaneous Tissue Debridement Description: Excisional Instrument: Curette Bleeding: Moderate Hemostasis Achieved: Pressure Response to Treatment: Procedure was tolerated well Level of Consciousness (Post- Awake and Alert procedure): Post Debridement Measurements of Total Wound Length: (cm) 2 Width: (cm) 1.3 Depth: (cm) 0.3 Volume: (cm) 0.613 Character of Wound/Ulcer Post Debridement: Stable Severity of Tissue Post Debridement: Limited to breakdown of skin Post Procedure Diagnosis Same as Pre-procedure Electronic Signature(s) Signed: 08/17/2019 4:41:04 PM By: Baltazar Najjar MD Signed: 08/18/2019 6:25:29 PM By: Elliot Gurney, BSN, RN, CWS, Kim RN, BSN Entered By: Baltazar Najjar on 08/17/2019 11:30:27 [name], [name] ([id number]) -------------------------------------------------------------------------------- HPI Details Patient Name: [name]. Date of Service: 08/17/2019 11:15 AM Medical Record Number:  [id number] Patient Account Number: [id number] Date of Birth/Sex: 06/02/1945 (74 y.o. M) Treating RN: Huel Coventry Primary Care Provider: Etheleen Nicks Other Clinician: Referring Provider: Etheleen Nicks Treating Provider/Extender: Altamese [place] in Treatment: 4 History of Present Illness HPI Description: As noted above the patient had a injury due to working in the yard and this rapidly progressed to a ulcerated area with an infection. In October 2014 he did have some vascular studies done but never saw a Careers adviser and never had any surgery for varicose veins. His past medical history is significant for hypertension, obstructive sleep apnea, morbid obesity, stasis dermatitis of both lower extremities and his past surgical history is suggestive of hip surgery on the right, gastric bypass surgery, cholecystectomy, hip replacement. He has recently been put on Levaquin 500 milligrams daily, Bactrim DS 1 twice a day and Bactroban 2% topical ointment locally. No recent labs or vascular or imaging studies done. 07/11/2014 a venous duplex study was done on 07/05/2014 -- it showed no evidence of deep vein thrombosis or superficial thrombosis bilaterally. 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 The patient has an appointment to see the vascular surgeons on June 20. 07/18/2014 his appointment with the vascular surgeons is rescheduled for this afternoon. 07/25/2014 -- he saw the vascular surgeon and is going to have a endovenous procedure done on July 29. 08/01/2014 -- he was diagnosed with a UTI and is on ciprofloxacin for 10 days. readmission: 11/27/15 On evaluation today patient is having discomfort in the right ankle region following an open wound from having dropped a cardboard box striking his ankle. He tells me that this has not healed despite many of the measures that he learned from previous injuries when he was  seen at our wound center. Therefore he didn't come in for reevaluation today. He has not noted any significant purulent discharge at this point in time. 12/04/15 patient appears to be doing somewhat better at this point in time  today in regard to his ankle wound. it is measuring smaller and looking cleaner. 12/11/15; patient wound continues to look improve this is on the right lateral ankle. Debrided of surface slough and nonviable subcutaneous tissue. Using silver alginate 12-18-15 Mr. Mia presents today with his wife for follow-up on right lower extremity venous ulcer. He denies any changes or complications over the past week. He is tolerating compression therapy to his right lower extremity and has a properly fitting compression hose to the left lower extremity. 12/25/15; patient with a wound on his right lateral lower extremity in the setting of severe venous insufficiency [originally trauma against the box]. His edema is well controlled he is using Prisma. 01/01/16; continued improvement using Prisma. He has severe venous insufficiency however the wound was originally trauma against a box. 01/08/16; his wound is totally epithelialized. We will give him measurements for graded pressure stockings which I think he is going to get at elastic therapy in [facility] Readmission: 10/30/17 patient presents today for reevaluation in our clinic although it has been since 2017 that we've last seen him. He has a ulcer on the right lateral ankle which he tells me began roughly 2 months ago. He states that he had one similar on the left ankle although this completely healed without any complication. However the one on the right as continue to give him a lot of trouble and is not healing appropriately. He really was not sure what the best thing to put on the area would be and therefore he's just been putting advantage to cover it. Another treatment has been utilized at this time. Fortunately  there is not appear to be any evidence of significant infection necessarily although there is some evidence of abnormal discoloration in regard to the base of the wound that does not look as healthy as I was on the lives life. He does have erythema surrounding but I'm not sure if this is just due to chronic venous stasis/lymphedema or if it truly is anything infection wise going on at this point. Nonetheless in general the patient seems to be doing fairly well he's not having too much pain although he does have some discomfort. No fevers, chills, nausea, or vomiting noted at this time. 11/13/17 on evaluation today patient actually appears to be doing much better in regard to his wound currently. There does seem to be evidence of good improvement there still a lot of maceration and some necrotic tissue on the surface of the wound but this was cleaned away very well today. Overall I'm very pleased with how things seem to be progressing in general. We did get the results of his culture back which showed that he did have evidence of Staphylococcus aureus as well as group B strep. It does appear that the Bactrim was sensitive for both. The wound again does look better. 11/20/17 on evaluation today patient's right lateral ankle ulcer actually appears to be doing excellent at this time. He has been tolerating the dressing changes without complication. With that being said overall I do feel like that he has made great progress in the few weeks we've been seeing him at this point. No fevers, chills, nausea, or vomiting noted at this time. 11/30/17 on evaluation today patient appears to be doing extremely well at this point in regard to his right lateral ankle ulcer. He has been tolerating the dressing changes without complication. In general I've been very pleased with the overall progress. His wound overview chart shows  that he is making steady progress towards closure. 12/07/17 on evaluation today patient  actually appears to be doing well in regard to his ulcer. He does have somewhat of an irregular heart rate noted at this point this was confirmed by manual checks as well. However it was not as low as the initial reading by the machine which was around 48 he was closer to around 58-62. Nonetheless it does sound as if you may have a little bit of an irregularity possibly a field although it was somewhat slow and difficult to gauge the exact rhythm. Otherwise his wound itself seems to be showing signs of great improvement with [name], [name] ([id number]) good epithelialization. He's tolerating the Iodoflex well. 12/14/17 on evaluation today patient continues to show signs of great improvement in my pinion. Overall I'm very pleased with the progress that is made. With that being said though he has shown improvement he tells me that for the first couple of days after we put his wrap on that he has severe pain for pretty much that entire amount of time. Nonetheless at this time I'm not seeing any evidence of infection which is good news. I think this may actually be coming from the actual dressing itself. That is the Iodoflex. 12/22/17 on evaluation today patient actually appears to be doing much better in regard to his ulcer. He's been shown signs of improvement which is great news. Fortunately there does not appear to be any evidence of infection at this time. Overall I feel like he has shown excellent progress even since just last week. 12/29/17 on evaluation today patient actually appears to be doing excellent although he does have some of the dressing material stuck to the wound bed. Fortunately there does not appear to be any signs of infection which is great news. Overall I'm very pleased with the progression of his wound. 01/05/18 on evaluation today patient actually appears to be doing rather well in regard to his ankle ulcer. He has been tolerating the dressing changes without complication.  Overall very pleased. He seems to be very close to healing. 01/14/18 on evaluation today patient actually appears to be doing excellent in regard to his right lateral lower extremity wound. He's been tolerating the dressing changes without complication. Fortunately he appears to be completely healed today. READMISSION 07/20/2019 This is a now 74 year old man that we have had on in this clinic on multiple occasions usually with a wound on the right lateral ankle in the setting of chronic venous hypertension. Looking at the notes from 2016 he had venous reflux studies saw vascular surgery and was supposed to have a procedure but I cannot see what procedure that was. He was last here in 2019 at which time he had an ulcer in this same area. He often describes minor trauma. On this occasion he simply saw 2 blisters one of the blisters opened and healed the other resulted in this wound he has not been using his juxta lite stockings in fact according to his wife he use these for perhaps 2 or 3 months after he was last here but has not used them since. He does keep his legs elevated. He is a minimal ambulator walking with crutches secondary to left hip issues. Past medical history is reviewed he has hypertension, obstructive sleep apnea on CPAP and O2, stasis dermatitis, hip history of a hip surgery, venous insufficiency with lymphedema. Urinary retention requiring a suprapubic catheter, panniculitis, status post Roux-en-Y gastric bypass ABIs in our  clinic were 1.22 on the right and 1.17 on the left 6/30; right lateral ankle wound which is a recurrent problem in the setting of chronic venous hypertension. He had a procedure in 2016 I will need to look this up. We have been using Iodoflex under the wound under compression. No evidence of arterial insufficiency 08/02/2019 on evaluation today patient's wound actually showed signs of significant improvement which is great news. The Iodoflex does seem to be doing a  great job keeping the necrotic tissue under control and in fact I was able to mechanically clean this away today without the use of sharp debridement which the patient tolerated in an excellent fashion. There is no evidence of infection currently. 7/14; [place]. His wound actually looks somewhat better. He has been using Iodoflex under compression 7/21; wound looks about the same same measurements requires debridement we switch to Gastro[facility]ology Eastydrofera Blue last time. I looked in epic I cannot see that he actually had a venous procedure in 2016 although it is clearly listed in her notes and I heal at that time. It would seem that he did have some type of procedure but I cannot find out what it is. Electronic Signature(s) Signed: 08/17/2019 4:41:04 PM By: Baltazar Najjarobson, Floy Riegler MD Entered By: Baltazar Najjarobson, Nathian Stencil on 08/17/2019 11:32:39 Shinall, [name] ([id number]) -------------------------------------------------------------------------------- Physical Exam Details Patient Name: [name] AlmaSHAFAR, [name] Date of Service: 08/17/2019 11:15 AM Medical Record Number: [id number] Patient Account Number: [id number] Date of Birth/Sex: 09/24/1945 (74 y.o. M) Treating RN: Huel CoventryWoody, Kim Primary Care Provider: Etheleen NicksMACDONALD, KERI Other Clinician: Referring Provider: Etheleen NicksMACDONALD, KERI Treating Provider/Extender: Altamese CarolinaOBSON, Lameisha Schuenemann G Weeks in Treatment: 4 Cardiovascular Pedal pulses palpable and strong bilaterally.. veinous insufficiecy. hemosiderin insufficiency. Notes Wound exam; not sure there is much change in the size of this on the right lateral malleolus. Thick callus skin removed from the wound edge using a #5 curette also debrided from the wound surface hemostasis with direct pressure. The wound cleans up quite nicely. Electronic Signature(s) Signed: 08/17/2019 4:41:04 PM By: Baltazar Najjarobson, Maison Kestenbaum MD Entered By: Baltazar Najjarobson, Nyeli Holtmeyer on 08/17/2019 11:40:27 [name], [name]  ([id number]) -------------------------------------------------------------------------------- Physician Orders Details Patient Name: [name] AlmaSHAFAR, Aylen R. Date of Service: 08/17/2019 11:15 AM Medical Record Number: [id number] Patient Account Number: [id number] Date of Birth/Sex: 01/11/1946 (74 y.o. M) Treating RN: Huel CoventryWoody, Kim Primary Care Provider: Etheleen NicksMACDONALD, KERI Other Clinician: Referring Provider: Etheleen NicksMACDONALD, KERI Treating Provider/Extender: Altamese CarolinaOBSON, Darnell Jeschke G Weeks in Treatment: 4 Verbal / Phone Orders: No Diagnosis Coding Wound Cleansing Wound #7 Right,Lateral Ankle o Cleanse wound with mild soap and water o May Shower, gently pat wound dry prior to applying new dressing. Anesthetic (add to Medication List) Wound #7 Right,Lateral Ankle o Topical Lidocaine 4% cream applied to wound bed prior to debridement (In Clinic Only). Primary Wound Dressing Wound #7 Right,Lateral Ankle o Hydrafera Blue Ready Transfer Secondary Dressing Wound #7 Right,Lateral Ankle o ABD pad Dressing Change Frequency Wound #7 Right,Lateral Ankle o Change dressing every week Follow-up Appointments Wound #7 Right,Lateral Ankle o Return Appointment in 1 week. o Nurse Visit as needed Edema Control Wound #7 Right,Lateral Ankle o 3 Layer Compression System - Right Lower Extremity - unna paste to anchor o Patient to wear own Velcro compression garment. - left o Elevate legs to the level of the heart and pump ankles as often as possible Additional Orders / Instructions Wound #7 Right,Lateral Ankle o Increase protein intake. o Activity as tolerated Electronic Signature(s) Signed: 08/17/2019 4:41:04 PM By: Baltazar Najjarobson, Mardella Nuckles MD Signed: 08/18/2019  6:25:29 PM By: Elliot Gurney, BSN, RN, CWS, Kim RN, BSN Entered By: Elliot Gurney, BSN, RN, CWS, Kim on 08/17/2019 11:25:36 [name] ([id number]) -------------------------------------------------------------------------------- Problem List Details Patient  Name: RAYNE, LOISEAU. Date of Service: 08/17/2019 11:15 AM Medical Record Number: [id number] Patient Account Number: [id number] Date of Birth/Sex: November 16, 1945 (74 y.o. M) Treating RN: Huel Coventry Primary Care Provider: Etheleen Nicks Other Clinician: Referring Provider: Etheleen Nicks Treating Provider/Extender: Altamese [place] in Treatment: 4 Active Problems ICD-10 Encounter Code Description Active Date MDM Diagnosis I87.331 Chronic venous hypertension (idiopathic) with ulcer and inflammation of 07/20/2019 No Yes right lower extremity L97.312 Non-pressure chronic ulcer of right ankle with fat layer exposed 07/20/2019 No Yes Inactive Problems Resolved Problems Electronic Signature(s) Signed: 08/17/2019 4:41:04 PM By: Baltazar Najjar MD Entered By: Baltazar Najjar on 08/17/2019 11:30:06 [name], [name] ([id number]) -------------------------------------------------------------------------------- Progress Note Details Patient Name: [name]. Date of Service: 08/17/2019 11:15 AM Medical Record Number: [id number] Patient Account Number: [id number] Date of Birth/Sex: [birth year] (74 y.o. M) Treating RN: Huel Coventry Primary Care Provider: Etheleen Nicks Other Clinician: Referring Provider: Etheleen Nicks Treating Provider/Extender: Altamese [place] in Treatment: 4 Subjective History of Present Illness (HPI) As noted above the patient had a injury due to working in the yard and this rapidly progressed to a ulcerated area with an infection. In October 2014 he did have some vascular studies done but never saw a Careers adviser and never had any surgery for varicose veins. His past medical history is significant for hypertension, obstructive sleep apnea, morbid obesity, stasis dermatitis of both lower extremities and his past surgical history is suggestive of hip surgery on the right, gastric bypass surgery, cholecystectomy, hip replacement. He has recently been put on Levaquin 500  milligrams daily, Bactrim DS 1 twice a day and Bactroban 2% topical ointment locally. No recent labs or vascular or imaging studies done. 07/11/2014 a venous duplex study was done on 07/05/2014 -- it showed no evidence of deep vein thrombosis or superficial thrombosis bilaterally. 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 The patient has an appointment to see the vascular surgeons on June 20. 07/18/2014 his appointment with the vascular surgeons is rescheduled for this afternoon. 07/25/2014 -- he saw the vascular surgeon and is going to have a endovenous procedure done on July 29. 08/01/2014 -- he was diagnosed with a UTI and is on ciprofloxacin for 10 days. readmission: 11/27/15 On evaluation today patient is having discomfort in the right ankle region following an open wound from having dropped a cardboard box striking his ankle. He tells me that this has not healed despite many of the measures that he learned from previous injuries when he was seen at our wound center. Therefore he didn't come in for reevaluation today. He has not noted any significant purulent discharge at this point in time. 12/04/15 patient appears to be doing somewhat better at this point in time today in regard to his ankle wound. it is measuring smaller and looking cleaner. 12/11/15; patient wound continues to look improve this is on the right lateral ankle. Debrided of surface slough and nonviable subcutaneous tissue. Using silver alginate 12-18-15 Mr. Granberg presents today with his wife for follow-up on right lower extremity venous ulcer. He denies any changes or complications over the past week. He is tolerating compression therapy to his right lower extremity and has a properly fitting compression hose to the left lower extremity. 12/25/15; patient with a wound  on his right lateral lower extremity in the setting of severe venous insufficiency  [originally trauma against the box]. His edema is well controlled he is using Prisma. 01/01/16; continued improvement using Prisma. He has severe venous insufficiency however the wound was originally trauma against a box. 01/08/16; his wound is totally epithelialized. We will give him measurements for graded pressure stockings which I think he is going to get at elastic therapy in [facility] Readmission: 10/30/17 patient presents today for reevaluation in our clinic although it has been since 2017 that we've last seen him. He has a ulcer on the right lateral ankle which he tells me began roughly 2 months ago. He states that he had one similar on the left ankle although this completely healed without any complication. However the one on the right as continue to give him a lot of trouble and is not healing appropriately. He really was not sure what the best thing to put on the area would be and therefore he's just been putting advantage to cover it. Another treatment has been utilized at this time. Fortunately there is not appear to be any evidence of significant infection necessarily although there is some evidence of abnormal discoloration in regard to the base of the wound that does not look as healthy as I was on the lives life. He does have erythema surrounding but I'm not sure if this is just due to chronic venous stasis/lymphedema or if it truly is anything infection wise going on at this point. Nonetheless in general the patient seems to be doing fairly well he's not having too much pain although he does have some discomfort. No fevers, chills, nausea, or vomiting noted at this time. 11/13/17 on evaluation today patient actually appears to be doing much better in regard to his wound currently. There does seem to be evidence of good improvement there still a lot of maceration and some necrotic tissue on the surface of the wound but this was cleaned away very well today. Overall I'm  very pleased with how things seem to be progressing in general. We did get the results of his culture back which showed that he did have evidence of Staphylococcus aureus as well as group B strep. It does appear that the Bactrim was sensitive for both. The wound again does look better. 11/20/17 on evaluation today patient's right lateral ankle ulcer actually appears to be doing excellent at this time. He has been tolerating the dressing changes without complication. With that being said overall I do feel like that he has made great progress in the few weeks we've been seeing him at this point. No fevers, chills, nausea, or vomiting noted at this time. 11/30/17 on evaluation today patient appears to be doing extremely well at this point in regard to his right lateral ankle ulcer. He has been tolerating the dressing changes without complication. In general I've been very pleased with the overall progress. His wound overview chart shows that he is making steady progress towards closure. 12/07/17 on evaluation today patient actually appears to be doing well in regard to his ulcer. He does have somewhat of an irregular heart rate noted at this point this was confirmed by manual checks as well. However it was not as low as the initial reading by the machine which was around 48 he was closer to around 58-62. Nonetheless it does sound as if you may have a little bit of an irregularity possibly a field although it was somewhat slow  and difficult to gauge the exact rhythm. Otherwise his wound itself seems to be showing signs of great improvement with good epithelialization. He's tolerating the Iodoflex well. [name], [name] ([id number]) 12/14/17 on evaluation today patient continues to show signs of great improvement in my pinion. Overall I'm very pleased with the progress that is made. With that being said though he has shown improvement he tells me that for the first couple of days after we put his wrap on  that he has severe pain for pretty much that entire amount of time. Nonetheless at this time I'm not seeing any evidence of infection which is good news. I think this may actually be coming from the actual dressing itself. That is the Iodoflex. 12/22/17 on evaluation today patient actually appears to be doing much better in regard to his ulcer. He's been shown signs of improvement which is great news. Fortunately there does not appear to be any evidence of infection at this time. Overall I feel like he has shown excellent progress even since just last week. 12/29/17 on evaluation today patient actually appears to be doing excellent although he does have some of the dressing material stuck to the wound bed. Fortunately there does not appear to be any signs of infection which is great news. Overall I'm very pleased with the progression of his wound. 01/05/18 on evaluation today patient actually appears to be doing rather well in regard to his ankle ulcer. He has been tolerating the dressing changes without complication. Overall very pleased. He seems to be very close to healing. 01/14/18 on evaluation today patient actually appears to be doing excellent in regard to his right lateral lower extremity wound. He's been tolerating the dressing changes without complication. Fortunately he appears to be completely healed today. READMISSION 07/20/2019 This is a now 74 year old man that we have had on in this clinic on multiple occasions usually with a wound on the right lateral ankle in the setting of chronic venous hypertension. Looking at the notes from 2016 he had venous reflux studies saw vascular surgery and was supposed to have a procedure but I cannot see what procedure that was. He was last here in 2019 at which time he had an ulcer in this same area. He often describes minor trauma. On this occasion he simply saw 2 blisters one of the blisters opened and healed the other resulted in this wound he  has not been using his juxta lite stockings in fact according to his wife he use these for perhaps 2 or 3 months after he was last here but has not used them since. He does keep his legs elevated. He is a minimal ambulator walking with crutches secondary to left hip issues. Past medical history is reviewed he has hypertension, obstructive sleep apnea on CPAP and O2, stasis dermatitis, hip history of a hip surgery, venous insufficiency with lymphedema. Urinary retention requiring a suprapubic catheter, panniculitis, status post Roux-en-Y gastric bypass ABIs in our clinic were 1.22 on the right and 1.17 on the left 6/30; right lateral ankle wound which is a recurrent problem in the setting of chronic venous hypertension. He had a procedure in 2016 I will need to look this up. We have been using Iodoflex under the wound under compression. No evidence of arterial insufficiency 08/02/2019 on evaluation today patient's wound actually showed signs of significant improvement which is great news. The Iodoflex does seem to be doing a great job keeping the necrotic tissue under control and in fact  I was able to mechanically clean this away today without the use of sharp debridement which the patient tolerated in an excellent fashion. There is no evidence of infection currently. 7/14; the patient is returned from [place] on vacation. His wound actually looks somewhat better. He has been using Iodoflex under compression 7/21; wound looks about the same same measurements requires debridement we switch to [facility] last time. I looked in epic I cannot see that he actually had a venous procedure in 2016 although it is clearly listed in her notes and I heal at that time. It would seem that he did have some type of procedure but I cannot find out what it is. Objective Constitutional Vitals Time Taken: 11:10 AM, Height: 69 in, Weight: 320 lbs, BMI: 47.3, Temperature: 98.2 F, Pulse: 61 bpm, Respiratory Rate: 18  breaths/min, Blood Pressure: 131/58 mmHg. Cardiovascular Pedal pulses palpable and strong bilaterally.. veinous insufficiecy. hemosiderin insufficiency. General Notes: Wound exam; not sure there is much change in the size of this on the right lateral malleolus. Thick callus skin removed from the wound edge using a #5 curette also debrided from the wound surface hemostasis with direct pressure. The wound cleans up quite nicely. Integumentary (Hair, Skin) Wound #7 status is Open. Original cause of wound was Blister. The wound is located on the Right,Lateral Ankle. The wound measures 2cm length x 1.3cm width x 0.2cm depth; 2.042cm^2 area and 0.408cm^3 volume. There is Fat Layer (Subcutaneous Tissue) Exposed exposed. There is a medium amount of serous drainage noted. The wound margin is flat and intact. There is large (67-100%) pink granulation within the wound bed. There is a small (1-33%) amount of necrotic tissue within the wound bed including Eschar and Adherent Slough. [name], [name] ([id number]) Assessment Active Problems ICD-10 Chronic venous hypertension (idiopathic) with ulcer and inflammation of right lower extremity Non-pressure chronic ulcer of right ankle with fat layer exposed Procedures Wound #7 Pre-procedure diagnosis of Wound #7 is a Venous Leg Ulcer located on the Right,Lateral Ankle .Severity of Tissue Pre Debridement is: Fat layer exposed. There was a Excisional Skin/Subcutaneous Tissue Debridement with a total area of 2.6 sq cm performed by Maxwell Caul, MD. With the following instrument(s): Curette to remove Viable and Non-Viable tissue/material. Material removed includes Callus, Subcutaneous Tissue, Slough, and Skin: Dermis after achieving pain control using Lidocaine. No specimens were taken. A time out was conducted at 11:23, prior to the start of the procedure. A Moderate amount of bleeding was controlled with Pressure. The procedure was tolerated well. Post  Debridement Measurements: 2cm length x 1.3cm width x 0.3cm depth; 0.613cm^3 volume. Character of Wound/Ulcer Post Debridement is stable. Severity of Tissue Post Debridement is: Limited to breakdown of skin. Post procedure Diagnosis Wound #7: Same as Pre-Procedure Plan Wound Cleansing: Wound #7 Right,Lateral Ankle: Cleanse wound with mild soap and water May Shower, gently pat wound dry prior to applying new dressing. Anesthetic (add to Medication List): Wound #7 Right,Lateral Ankle: Topical Lidocaine 4% cream applied to wound bed prior to debridement (In Clinic Only). Primary Wound Dressing: Wound #7 Right,Lateral Ankle: Hydrafera Blue Ready Transfer Secondary Dressing: Wound #7 Right,Lateral Ankle: ABD pad Dressing Change Frequency: Wound #7 Right,Lateral Ankle: Change dressing every week Follow-up Appointments: Wound #7 Right,Lateral Ankle: Return Appointment in 1 week. Nurse Visit as needed Edema Control: Wound #7 Right,Lateral Ankle: 3 Layer Compression System - Right Lower Extremity - unna paste to anchor Patient to wear own Velcro compression garment. - left Elevate legs to the level  of the heart and pump ankles as often as possible Additional Orders / Instructions: Wound #7 Right,Lateral Ankle: Increase protein intake. Activity as tolerated 1. I am continuing with the [facility]. Still under 3 layer compression 2. Although I can see in iheal notes from 2016 and I heal that he had a venous procedure I do not see the specifics I do not even see which side this was on. There is nothing in epic on this. Presumably done at [facility] Electronic Signature(s) Signed: 08/17/2019 4:41:04 PM By: Baltazar Najjar MD Entered By: Baltazar Najjar on 08/17/2019 11:42:10 [name], [name] ([id number]) [name], [name] ([id number]) -------------------------------------------------------------------------------- SuperBill Details Patient Name: [name]. Date of Service:  08/17/2019 Medical Record Number: [id number] Patient Account Number: [id number] Date of Birth/Sex: [birth year] (74 y.o. M) Treating RN: Huel Coventry Primary Care Provider: Etheleen Nicks Other Clinician: Referring Provider: Etheleen Nicks Treating Provider/Extender: Altamese Gargatha in Treatment: 4 Diagnosis Coding ICD-10 Codes Code Description [phone] Chronic venous hypertension (idiopathic) with ulcer and inflammation of right lower extremity L97.312 Non-pressure chronic ulcer of right ankle with fat layer exposed Facility Procedures CPT4 Code Description: 13244010 11042 - DEB SUBQ TISSUE 20 SQ CM/< Modifier: Quantity: 1 CPT4 Code Description: ICD-10 Diagnosis Description I87.331 Chronic venous hypertension (idiopathic) with ulcer and inflammation of L97.312 Non-pressure chronic ulcer of right ankle with fat layer exposed Modifier: right lower extremi Quantity: ty Physician Procedures CPT4 Code Description: [id number] 11042 - WC PHYS SUBQ TISS 20 SQ CM Modifier: Quantity: 1 CPT4 Code Description: ICD-10 Diagnosis Description I87.331 Chronic venous hypertension (idiopathic) with ulcer and inflammation of L97.312 Non-pressure chronic ulcer of right ankle with fat layer exposed Modifier: right lower extremit Quantity: y Psychologist, prison and probation services) Signed: 08/17/2019 4:41:04 PM By: Baltazar Najjar MD Entered By: Baltazar Najjar on 08/17/2019 11:42:29

## 2019-08-22 ENCOUNTER — Ambulatory Visit (INDEPENDENT_AMBULATORY_CARE_PROVIDER_SITE_OTHER): Payer: Medicare Other | Admitting: Physician Assistant

## 2019-08-22 ENCOUNTER — Other Ambulatory Visit: Payer: Self-pay

## 2019-08-22 ENCOUNTER — Encounter: Payer: Self-pay | Admitting: Physician Assistant

## 2019-08-22 VITALS — BP 146/78 | HR 63 | Ht 69.02 in | Wt 338.0 lb

## 2019-08-22 DIAGNOSIS — R339 Retention of urine, unspecified: Secondary | ICD-10-CM | POA: Diagnosis not present

## 2019-08-22 NOTE — Progress Notes (Signed)
Suprapubic Cath Change  Patient is present today for a suprapubic catheter change due to urinary retention.  13ml of water was drained from the balloon, a 16FR foley cath was removed from the tract without difficulty.  Site was cleaned and prepped in a sterile fashion with betadine.  A 16FR foley cath was replaced into the tract no complications were noted. Urine return was not noted and patient was counseled to return to clinic if return was not noted within 1 hour, 10 ml of sterile water was inflated into the balloon and a leg bag on extension tubing was attached for drainage.  Patient tolerated well. An additional leg bag was given to patient and proper instruction was given on how to switch bags.    Additional notes: Used silver nitrate to cauterize one residual site of granulation tissue at the opening of the tract. Patient tolerated well.  Performed by: Carman Ching, PA-C and Franchot Erichsen, CMA  Follow up: Return in about 1 month (around 09/22/2019) for SPT Exchange .

## 2019-08-23 ENCOUNTER — Encounter: Payer: Medicare Other | Admitting: Physician Assistant

## 2019-08-23 ENCOUNTER — Other Ambulatory Visit: Payer: Self-pay

## 2019-08-23 DIAGNOSIS — L97312 Non-pressure chronic ulcer of right ankle with fat layer exposed: Secondary | ICD-10-CM | POA: Diagnosis not present

## 2019-08-23 NOTE — Progress Notes (Addendum)
[name] ([id number]) Visit Report for 08/23/2019 Chief Complaint Document Details Patient Name: [name], [name]. Date of Service: 08/23/2019 3:30 PM Medical Record Number: [id number] Patient Account Number: [id number] Date of Birth/Sex: 1945/07/27 (74 y.o. M) Treating RN: Huel Coventry Primary Care Provider: Etheleen Nicks Other Clinician: Referring Provider: Etheleen Nicks Treating Provider/Extender: Linwood Dibbles, Marguarite Markov Weeks in Treatment: 4 Information Obtained from: Patient Chief Complaint Right lateral ankle ulcer 07/20/2019; patient is here for a review of the wound on the right lateral ankle Electronic Signature(s) Signed: 08/23/2019 3:34:54 PM By: Lenda Kelp PA-C Entered By: Lenda Kelp on 08/23/2019 15:34:53 [name]. ([id number]) -------------------------------------------------------------------------------- HPI Details Patient Name: [name]. Date of Service: 08/23/2019 3:30 PM Medical Record Number: [id number] Patient Account Number: [id number] Date of Birth/Sex: 10-12-1945 (74 y.o. M) Treating RN: Huel Coventry Primary Care Provider: Etheleen Nicks Other Clinician: Referring Provider: Etheleen Nicks Treating Provider/Extender: Linwood Dibbles, Keitra Carusone Weeks in Treatment: 4 History of Present Illness HPI Description: As noted above the patient had a injury due to working in the yard and this rapidly progressed to a ulcerated area with an infection. In October 2014 he did have some vascular studies done but never saw a Careers adviser and never had any surgery for varicose veins. His past medical history is significant for hypertension, obstructive sleep apnea, morbid obesity, stasis dermatitis of both lower extremities and his past surgical history is suggestive of hip surgery on the right, gastric bypass surgery, cholecystectomy, hip replacement. He has recently been put on Levaquin 500 milligrams daily, Bactrim DS 1 twice a day and Bactroban 2% topical ointment locally.  No recent labs or vascular or imaging studies done. 07/11/2014 a venous duplex study was done on 07/05/2014 -- it showed no evidence of deep vein thrombosis or superficial thrombosis bilaterally. 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 The patient has an appointment to see the vascular surgeons on June 20. 07/18/2014 his appointment with the vascular surgeons is rescheduled for this afternoon. 07/25/2014 -- he saw the vascular surgeon and is going to have a endovenous procedure done on July 29. 08/01/2014 -- he was diagnosed with a UTI and is on ciprofloxacin for 10 days. readmission: 11/27/15 On evaluation today patient is having discomfort in the right ankle region following an open wound from having dropped a cardboard box striking his ankle. He tells me that this has not healed despite many of the measures that he learned from previous injuries when he was seen at our wound center. Therefore he didn't come in for reevaluation today. He has not noted any significant purulent discharge at this point in time. 12/04/15 patient appears to be doing somewhat better at this point in time today in regard to his ankle wound. it is measuring smaller and looking cleaner. 12/11/15; patient wound continues to look improve this is on the right lateral ankle. Debrided of surface slough and nonviable subcutaneous tissue. Using silver alginate 12-18-15 [name] presents today with his wife for follow-up on right lower extremity venous ulcer. He denies any changes or complications over the past week. He is tolerating compression therapy to his right lower extremity and has a properly fitting compression hose to the left lower extremity. 12/25/15; patient with a wound on his right lateral lower extremity in the setting of severe venous insufficiency [originally trauma against the box]. His edema is well controlled he is using  Prisma. 01/01/16; continued improvement using Prisma. He has  severe venous insufficiency however the wound was originally trauma against a box. 01/08/16; his wound is totally epithelialized. We will give him measurements for graded pressure stockings which I think he is going to get at elastic therapy in [facility] Readmission: 10/30/17 patient presents today for reevaluation in our clinic although it has been since 2017 that we've last seen him. He has a ulcer on the right lateral ankle which he tells me began roughly 2 months ago. He states that he had one similar on the left ankle although this completely healed without any complication. However the one on the right as continue to give him a lot of trouble and is not healing appropriately. He really was not sure what the best thing to put on the area would be and therefore he's just been putting advantage to cover it. Another treatment has been utilized at this time. Fortunately there is not appear to be any evidence of significant infection necessarily although there is some evidence of abnormal discoloration in regard to the base of the wound that does not look as healthy as I was on the lives life. He does have erythema surrounding but I'm not sure if this is just due to chronic venous stasis/lymphedema or if it truly is anything infection wise going on at this point. Nonetheless in general the patient seems to be doing fairly well he's not having too much pain although he does have some discomfort. No fevers, chills, nausea, or vomiting noted at this time. 11/13/17 on evaluation today patient actually appears to be doing much better in regard to his wound currently. There does seem to be evidence of good improvement there still a lot of maceration and some necrotic tissue on the surface of the wound but this was cleaned away very well today. Overall I'm very pleased with how things seem to be progressing in general. We did get the  results of his culture back which showed that he did have evidence of Staphylococcus aureus as well as group B strep. It does appear that the Bactrim was sensitive for both. The wound again does look better. 11/20/17 on evaluation today patient's right lateral ankle ulcer actually appears to be doing excellent at this time. He has been tolerating the dressing changes without complication. With that being said overall I do feel like that he has made great progress in the few weeks we've been seeing him at this point. No fevers, chills, nausea, or vomiting noted at this time. 11/30/17 on evaluation today patient appears to be doing extremely well at this point in regard to his right lateral ankle ulcer. He has been tolerating the dressing changes without complication. In general I've been very pleased with the overall progress. His wound overview chart shows that he is making steady progress towards closure. 12/07/17 on evaluation today patient actually appears to be doing well in regard to his ulcer. He does have somewhat of an irregular heart rate noted at this point this was confirmed by manual checks as well. However it was not as low as the initial reading by the machine which was around 48 he was closer to around 58-62. Nonetheless it does sound as if you may have a little bit of an irregularity possibly a field although it was somewhat slow and difficult to gauge the exact rhythm. Otherwise his wound itself seems to be showing signs of great improvement with Meckel, Kiondre [name]. ([id number]) good epithelialization. He's tolerating the Iodoflex well. 12/14/17 on evaluation today  patient continues to show signs of great improvement in my pinion. Overall I'm very pleased with the progress that is made. With that being said though he has shown improvement he tells me that for the first couple of days after we put his wrap on that he has severe pain for pretty much that entire amount of time. Nonetheless  at this time I'm not seeing any evidence of infection which is good news. I think this may actually be coming from the actual dressing itself. That is the Iodoflex. 12/22/17 on evaluation today patient actually appears to be doing much better in regard to his ulcer. He's been shown signs of improvement which is great news. Fortunately there does not appear to be any evidence of infection at this time. Overall I feel like he has shown excellent progress even since just last week. 12/29/17 on evaluation today patient actually appears to be doing excellent although he does have some of the dressing material stuck to the wound bed. Fortunately there does not appear to be any signs of infection which is great news. Overall I'm very pleased with the progression of his wound. 01/05/18 on evaluation today patient actually appears to be doing rather well in regard to his ankle ulcer. He has been tolerating the dressing changes without complication. Overall very pleased. He seems to be very close to healing. 01/14/18 on evaluation today patient actually appears to be doing excellent in regard to his right lateral lower extremity wound. He's been tolerating the dressing changes without complication. Fortunately he appears to be completely healed today. READMISSION 07/20/2019 This is a now 74 year old man that we have had on in this clinic on multiple occasions usually with a wound on the right lateral ankle in the setting of chronic venous hypertension. Looking at the notes from 2016 he had venous reflux studies saw vascular surgery and was supposed to have a procedure but I cannot see what procedure that was. He was last here in 2019 at which time he had an ulcer in this same area. He often describes minor trauma. On this occasion he simply saw 2 blisters one of the blisters opened and healed the other resulted in this wound he has not been using his juxta lite stockings in fact according to his wife he use  these for perhaps 2 or 3 months after he was last here but has not used them since. He does keep his legs elevated. He is a minimal ambulator walking with crutches secondary to left hip issues. Past medical history is reviewed he has hypertension, obstructive sleep apnea on CPAP and O2, stasis dermatitis, hip history of a hip surgery, venous insufficiency with lymphedema. Urinary retention requiring a suprapubic catheter, panniculitis, status post Roux-en-Y gastric bypass ABIs in our clinic were 1.22 on the right and 1.17 on the left 6/30; right lateral ankle wound which is a recurrent problem in the setting of chronic venous hypertension. He had a procedure in 2016 I will need to look this up. We have been using Iodoflex under the wound under compression. No evidence of arterial insufficiency 08/02/2019 on evaluation today patient's wound actually showed signs of significant improvement which is great news. The Iodoflex does seem to be doing a great job keeping the necrotic tissue under control and in fact I was able to mechanically clean this away today without the use of sharp debridement which the patient tolerated in an excellent fashion. There is no evidence of infection currently. 7/14; the patient is returned  from [place] on vacation. His wound actually looks somewhat better. He has been using Iodoflex under compression 7/21; wound looks about the same same measurements requires debridement we switch to [facility] last time. I looked in epic I cannot see that he actually had a venous procedure in 2016 although it is clearly listed in her notes and I heal at that time. It would seem that he did have some type of procedure but I cannot find out what it is. 08/23/2019 on evaluation today patient appears to be doing much better at this point. He has been tolerating the dressing changes without complication and overall I am extremely pleased with how things are progressing. There is no signs of  active infection at this time. Electronic Signature(s) Signed: 08/23/2019 4:30:09 PM By: Lenda Kelp PA-C Entered By: Lenda Kelp on 08/23/2019 16:30:08 Simmerman, [name] ([id number]) -------------------------------------------------------------------------------- Physical Exam Details Patient Name: [name]. Date of Service: 08/23/2019 3:30 PM Medical Record Number: [id number] Patient Account Number: [id number] Date of Birth/Sex: 09-28-1945 (74 y.o. M) Treating RN: Huel Coventry Primary Care Provider: Etheleen Nicks Other Clinician: Referring Provider: Etheleen Nicks Treating Provider/Extender: Linwood Dibbles, Brayleigh Rybacki Weeks in Treatment: 4 Constitutional Well-nourished and well-hydrated in no acute distress. Respiratory normal breathing without difficulty. Psychiatric this patient is able to make decisions and demonstrates good insight into disease process. Alert and Oriented x 3. pleasant and cooperative. Notes Patient's wound bed again showed signs of excellent epithelization and granulation I feel like he is slowly making progress. There does not appear to be any significant slough buildup at this time which is great news and overall pleased in that regard. Electronic Signature(s) Signed: 08/23/2019 4:30:34 PM By: Lenda Kelp PA-C Entered By: Lenda Kelp on 08/23/2019 16:30:33 Karle, [name] ([id number]) -------------------------------------------------------------------------------- Physician Orders Details Patient Name: [name]. Date of Service: 08/23/2019 3:30 PM Medical Record Number: [id number] Patient Account Number: [id number] Date of Birth/Sex: 12-18-1945 (74 y.o. M) Treating RN: Huel Coventry Primary Care Provider: Etheleen Nicks Other Clinician: Referring Provider: Etheleen Nicks Treating Provider/Extender: Linwood Dibbles, Shiron Whetsel Weeks in Treatment: 4 Verbal / Phone Orders: No Diagnosis Coding ICD-10 Coding Code Description I87.331 Chronic venous  hypertension (idiopathic) with ulcer and inflammation of right lower extremity L97.312 Non-pressure chronic ulcer of right ankle with fat layer exposed Wound Cleansing Wound #7 Right,Lateral Ankle o Cleanse wound with mild soap and water o May Shower, gently pat wound dry prior to applying new dressing. Anesthetic (add to Medication List) Wound #7 Right,Lateral Ankle o Topical Lidocaine 4% cream applied to wound bed prior to debridement (In Clinic Only). Primary Wound Dressing Wound #7 Right,Lateral Ankle o Hydrafera Blue Ready Transfer Secondary Dressing Wound #7 Right,Lateral Ankle o ABD pad Dressing Change Frequency Wound #7 Right,Lateral Ankle o Change dressing every week Follow-up Appointments Wound #7 Right,Lateral Ankle o Return Appointment in 1 week. o Nurse Visit as needed Edema Control Wound #7 Right,Lateral Ankle o 3 Layer Compression System - Right Lower Extremity - unna paste to anchor o Patient to wear own Velcro compression garment. - left o Elevate legs to the level of the heart and pump ankles as often as possible Additional Orders / Instructions Wound #7 Right,Lateral Ankle o Increase protein intake. o Activity as tolerated Electronic Signature(s) Signed: 08/23/2019 3:58:26 PM By: Elliot Gurney, BSN, RN, CWS, Kim RN, BSN Signed: 08/23/2019 4:38:51 PM By: Lenda Kelp PA-C Entered By: Elliot Gurney, BSN, RN, CWS, Kim on 08/23/2019 15:56:03 Gebert, [name] ([id number]) -------------------------------------------------------------------------------- Problem List Details  Patient Name: CORDARREL, STIEFEL. Date of Service: 08/23/2019 3:30 PM Medical Record Number: [id number] Patient Account Number: [id number] Date of Birth/Sex: [birth year] (74 y.o. M) Treating RN: Huel Coventry Primary Care Provider: Etheleen Nicks Other Clinician: Referring Provider: Etheleen Nicks Treating Provider/Extender: Linwood Dibbles, Sindee Stucker Weeks in Treatment: 4 Active  Problems ICD-10 Encounter Code Description Active Date MDM Diagnosis I87.331 Chronic venous hypertension (idiopathic) with ulcer and inflammation of 07/20/2019 No Yes right lower extremity L97.312 Non-pressure chronic ulcer of right ankle with fat layer exposed 07/20/2019 No Yes Inactive Problems Resolved Problems Electronic Signature(s) Signed: 08/23/2019 3:34:46 PM By: Lenda Kelp PA-C Entered By: Lenda Kelp on 08/23/2019 15:34:45 Chamorro, Kosta [name]. ([id number]) -------------------------------------------------------------------------------- Progress Note Details Patient Name: [name]. Date of Service: 08/23/2019 3:30 PM Medical Record Number: [id number] Patient Account Number: [id number] Date of Birth/Sex: 13-Jul-1945 (74 y.o. M) Treating RN: Huel Coventry Primary Care Provider: Etheleen Nicks Other Clinician: Referring Provider: Etheleen Nicks Treating Provider/Extender: Linwood Dibbles, Yaritzi Craun Weeks in Treatment: 4 Subjective Chief Complaint Information obtained from Patient Right lateral ankle ulcer 07/20/2019; patient is here for a review of the wound on the right lateral ankle History of Present Illness (HPI) As noted above the patient had a injury due to working in the yard and this rapidly progressed to a ulcerated area with an infection. In October 2014 he did have some vascular studies done but never saw a Careers adviser and never had any surgery for varicose veins. His past medical history is significant for hypertension, obstructive sleep apnea, morbid obesity, stasis dermatitis of both lower extremities and his past surgical history is suggestive of hip surgery on the right, gastric bypass surgery, cholecystectomy, hip replacement. He has recently been put on Levaquin 500 milligrams daily, Bactrim DS 1 twice a day and Bactroban 2% topical ointment locally. No recent labs or vascular or imaging studies done. 07/11/2014 a venous duplex study was done on 07/05/2014 -- it showed  no evidence of deep vein thrombosis or superficial thrombosis bilaterally. 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 The patient has an appointment to see the vascular surgeons on June 20. 07/18/2014 his appointment with the vascular surgeons is rescheduled for this afternoon. 07/25/2014 -- he saw the vascular surgeon and is going to have a endovenous procedure done on July 29. 08/01/2014 -- he was diagnosed with a UTI and is on ciprofloxacin for 10 days. readmission: 11/27/15 On evaluation today patient is having discomfort in the right ankle region following an open wound from having dropped a cardboard box striking his ankle. He tells me that this has not healed despite many of the measures that he learned from previous injuries when he was seen at our wound center. Therefore he didn't come in for reevaluation today. He has not noted any significant purulent discharge at this point in time. 12/04/15 patient appears to be doing somewhat better at this point in time today in regard to his ankle wound. it is measuring smaller and looking cleaner. 12/11/15; patient wound continues to look improve this is on the right lateral ankle. Debrided of surface slough and nonviable subcutaneous tissue. Using silver alginate 12-18-15 [name] presents today with his wife for follow-up on right lower extremity venous ulcer. He denies any changes or complications over the past week. He is tolerating compression therapy to his right lower extremity and has a properly fitting compression hose to the left lower extremity. 12/25/15; patient with a wound on  his right lateral lower extremity in the setting of severe venous insufficiency [originally trauma against the box]. His edema is well controlled he is using Prisma. 01/01/16; continued improvement using Prisma. He has severe venous insufficiency however the wound was originally trauma  against a box. 01/08/16; his wound is totally epithelialized. We will give him measurements for graded pressure stockings which I think he is going to get at elastic therapy in [facility] Readmission: 10/30/17 patient presents today for reevaluation in our clinic although it has been since 2017 that we've last seen him. He has a ulcer on the right lateral ankle which he tells me began roughly 2 months ago. He states that he had one similar on the left ankle although this completely healed without any complication. However the one on the right as continue to give him a lot of trouble and is not healing appropriately. He really was not sure what the best thing to put on the area would be and therefore he's just been putting advantage to cover it. Another treatment has been utilized at this time. Fortunately there is not appear to be any evidence of significant infection necessarily although there is some evidence of abnormal discoloration in regard to the base of the wound that does not look as healthy as I was on the lives life. He does have erythema surrounding but I'm not sure if this is just due to chronic venous stasis/lymphedema or if it truly is anything infection wise going on at this point. Nonetheless in general the patient seems to be doing fairly well he's not having too much pain although he does have some discomfort. No fevers, chills, nausea, or vomiting noted at this time. 11/13/17 on evaluation today patient actually appears to be doing much better in regard to his wound currently. There does seem to be evidence of good improvement there still a lot of maceration and some necrotic tissue on the surface of the wound but this was cleaned away very well today. Overall I'm very pleased with how things seem to be progressing in general. We did get the results of his culture back which showed that he did have evidence of Staphylococcus aureus as well as group B strep. It does  appear that the Bactrim was sensitive for both. The wound again does look better. 11/20/17 on evaluation today patient's right lateral ankle ulcer actually appears to be doing excellent at this time. He has been tolerating the dressing changes without complication. With that being said overall I do feel like that he has made great progress in the few weeks we've been seeing him at this point. No fevers, chills, nausea, or vomiting noted at this time. 11/30/17 on evaluation today patient appears to be doing extremely well at this point in regard to his right lateral ankle ulcer. He has been tolerating the dressing changes without complication. In general I've been very pleased with the overall progress. His wound overview chart shows that he is making steady progress towards closure. [name], [name] ([id number]) 12/07/17 on evaluation today patient actually appears to be doing well in regard to his ulcer. He does have somewhat of an irregular heart rate noted at this point this was confirmed by manual checks as well. However it was not as low as the initial reading by the machine which was around 48 he was closer to around 58-62. Nonetheless it does sound as if you may have a little bit of an irregularity possibly a field although it  was somewhat slow and difficult to gauge the exact rhythm. Otherwise his wound itself seems to be showing signs of great improvement with good epithelialization. He's tolerating the Iodoflex well. 12/14/17 on evaluation today patient continues to show signs of great improvement in my pinion. Overall I'm very pleased with the progress that is made. With that being said though he has shown improvement he tells me that for the first couple of days after we put his wrap on that he has severe pain for pretty much that entire amount of time. Nonetheless at this time I'm not seeing any evidence of infection which is good news. I think this may actually be coming from the actual  dressing itself. That is the Iodoflex. 12/22/17 on evaluation today patient actually appears to be doing much better in regard to his ulcer. He's been shown signs of improvement which is great news. Fortunately there does not appear to be any evidence of infection at this time. Overall I feel like he has shown excellent progress even since just last week. 12/29/17 on evaluation today patient actually appears to be doing excellent although he does have some of the dressing material stuck to the wound bed. Fortunately there does not appear to be any signs of infection which is great news. Overall I'm very pleased with the progression of his wound. 01/05/18 on evaluation today patient actually appears to be doing rather well in regard to his ankle ulcer. He has been tolerating the dressing changes without complication. Overall very pleased. He seems to be very close to healing. 01/14/18 on evaluation today patient actually appears to be doing excellent in regard to his right lateral lower extremity wound. He's been tolerating the dressing changes without complication. Fortunately he appears to be completely healed today. READMISSION 07/20/2019 This is a now 74 year old man that we have had on in this clinic on multiple occasions usually with a wound on the right lateral ankle in the setting of chronic venous hypertension. Looking at the notes from 2016 he had venous reflux studies saw vascular surgery and was supposed to have a procedure but I cannot see what procedure that was. He was last here in 2019 at which time he had an ulcer in this same area. He often describes minor trauma. On this occasion he simply saw 2 blisters one of the blisters opened and healed the other resulted in this wound he has not been using his juxta lite stockings in fact according to his wife he use these for perhaps 2 or 3 months after he was last here but has not used them since. He does keep his legs elevated. He is a  minimal ambulator walking with crutches secondary to left hip issues. Past medical history is reviewed he has hypertension, obstructive sleep apnea on CPAP and O2, stasis dermatitis, hip history of a hip surgery, venous insufficiency with lymphedema. Urinary retention requiring a suprapubic catheter, panniculitis, status post Roux-en-Y gastric bypass ABIs in our clinic were 1.22 on the right and 1.17 on the left 6/30; right lateral ankle wound which is a recurrent problem in the setting of chronic venous hypertension. He had a procedure in 2016 I will need to look this up. We have been using Iodoflex under the wound under compression. No evidence of arterial insufficiency 08/02/2019 on evaluation today patient's wound actually showed signs of significant improvement which is great news. The Iodoflex does seem to be doing a great job keeping the necrotic tissue under control and in fact I  was able to mechanically clean this away today without the use of sharp debridement which the patient tolerated in an excellent fashion. There is no evidence of infection currently. 7/14; the patient is returned from [place] on vacation. His wound actually looks somewhat better. He has been using Iodoflex under compression 7/21; wound looks about the same same measurements requires debridement we switch to [facility] last time. I looked in epic I cannot see that he actually had a venous procedure in 2016 although it is clearly listed in her notes and I heal at that time. It would seem that he did have some type of procedure but I cannot find out what it is. 08/23/2019 on evaluation today patient appears to be doing much better at this point. He has been tolerating the dressing changes without complication and overall I am extremely pleased with how things are progressing. There is no signs of active infection at this time. Objective Constitutional Well-nourished and well-hydrated in no acute distress. Vitals Time  Taken: 3:30 AM, Height: 69 in, Weight: 320 lbs, BMI: 47.3, Temperature: 98.6 F, Pulse: 67 bpm, Respiratory Rate: 20 breaths/min, Blood Pressure: 143/54 mmHg. Respiratory normal breathing without difficulty. Psychiatric this patient is able to make decisions and demonstrates good insight into disease process. Alert and Oriented x 3. pleasant and cooperative. [name], [name] Kitchen ([id number]) General Notes: Patient's wound bed again showed signs of excellent epithelization and granulation I feel like he is slowly making progress. There does not appear to be any significant slough buildup at this time which is great news and overall pleased in that regard. Integumentary (Hair, Skin) Wound #7 status is Open. Original cause of wound was Blister. The wound is located on the Right,Lateral Ankle. The wound measures 1.8cm length x 1cm width x 0.2cm depth; 1.414cm^2 area and 0.283cm^3 volume. There is Fat Layer (Subcutaneous Tissue) Exposed exposed. There is a medium amount of serous drainage noted. The wound margin is flat and intact. There is large (67-100%) pink granulation within the wound bed. There is a small (1-33%) amount of necrotic tissue within the wound bed including Eschar and Adherent Slough. Assessment Active Problems ICD-10 Chronic venous hypertension (idiopathic) with ulcer and inflammation of right lower extremity Non-pressure chronic ulcer of right ankle with fat layer exposed Procedures Wound #7 Pre-procedure diagnosis of Wound #7 is a Venous Leg Ulcer located on the Right,Lateral Ankle . There was a Three Layer Compression Therapy Procedure with a pre-treatment ABI of 1.2 by Huel Coventry, RN. Post procedure Diagnosis Wound #7: Same as Pre-Procedure Plan Wound Cleansing: Wound #7 Right,Lateral Ankle: Cleanse wound with mild soap and water May Shower, gently pat wound dry prior to applying new dressing. Anesthetic (add to Medication List): Wound #7 Right,Lateral Ankle: Topical  Lidocaine 4% cream applied to wound bed prior to debridement (In Clinic Only). Primary Wound Dressing: Wound #7 Right,Lateral Ankle: Hydrafera Blue Ready Transfer Secondary Dressing: Wound #7 Right,Lateral Ankle: ABD pad Dressing Change Frequency: Wound #7 Right,Lateral Ankle: Change dressing every week Follow-up Appointments: Wound #7 Right,Lateral Ankle: Return Appointment in 1 week. Nurse Visit as needed Edema Control: Wound #7 Right,Lateral Ankle: 3 Layer Compression System - Right Lower Extremity - unna paste to anchor Patient to wear own Velcro compression garment. - left Elevate legs to the level of the heart and pump ankles as often as possible Additional Orders / Instructions: Wound #7 Right,Lateral Ankle: Increase protein intake. Activity as tolerated 1. I Ernie Hew suggest currently that we continue with the [facility] I think  that still a good option for the patient at this time and he is in agreement the plan he is showing signs of good progress with this. 2. I am also can recommend that we continue with the 3 layer compression wrap that seems to also be doing well for him. [name], [name]. ([id number]) 3. I am also can recommend he continue to elevate his legs much as possible as well as continue with normal activity the morning can walk and ambulate around the better off he will be as well. We will see patient back for reevaluation in 1 week here in the clinic. If anything worsens or changes patient will contact our office for additional recommendations. Electronic Signature(s) Signed: 08/23/2019 4:31:11 PM By: Lenda KelpStone III, Shunta Mclaurin PA-C Entered By: Lenda KelpStone III, Quanika Solem on 08/23/2019 16:31:10 [name], [name] GuildGLENN [name]. ([id number]) -------------------------------------------------------------------------------- SuperBill Details Patient Name: [name], [name]. Date of Service: 08/23/2019 Medical Record Number: [id number] Patient Account Number: [id number] Date of Birth/Sex: 07/16/1945 (74  y.o. M) Treating RN: Huel CoventryWoody, Kim Primary Care Provider: Etheleen NicksMACDONALD, KERI Other Clinician: Referring Provider: Etheleen NicksMACDONALD, KERI Treating Provider/Extender: Linwood DibblesSTONE III, Marianny Goris Weeks in Treatment: 4 Diagnosis Coding ICD-10 Codes Code Description [phone] Chronic venous hypertension (idiopathic) with ulcer and inflammation of right lower extremity L97.312 Non-pressure chronic ulcer of right ankle with fat layer exposed Facility Procedures CPT4 Code: [id number] Description: (Facility Use Only) [id number] - APPLY MULTLAY COMPRS LWR RT LEG Modifier: Quantity: 1 Physician Procedures CPT4 Code Description: [id number] - WC PHYS LEVEL 3 - EST PT Modifier: Quantity: 1 CPT4 Code Description: ICD-10 Diagnosis Description I87.331 Chronic venous hypertension (idiopathic) with ulcer and inflammation of L97.312 Non-pressure chronic ulcer of right ankle with fat layer exposed Modifier: right lower extremit Quantity: y Psychologist, prison and probation serviceslectronic Signature(s) Signed: 08/23/2019 4:38:20 PM By: Lenda KelpStone III, Achaia Garlock PA-C Previous Signature: 08/23/2019 3:58:26 PM Version By: Elliot GurneyWoody, BSN, RN, CWS, Kim RN, BSN Entered By: Lenda KelpStone III, Duvid Smalls on 08/23/2019 16:38:20

## 2019-08-23 NOTE — Progress Notes (Addendum)
HUNTER, BACHAR ([id number]) Visit Report for 08/23/2019 Arrival Information Details Patient Name: [name], [name]. Date of Service: 08/23/2019 3:30 PM Medical Record Number: [id number] Patient Account Number: [id number] Date of Birth/Sex: [birth year] (74 y.o. M) Treating RN: Huel Coventry Primary Care Oluwaseyi Raffel: Etheleen Nicks Other Clinician: Referring Christopherjohn Schiele: Etheleen Nicks Treating Rande Dario/Extender: Linwood Dibbles, HOYT Weeks in Treatment: 4 Visit Information History Since Last Visit All ordered tests and consults were completed: No Patient Arrived: Ambulatory Added or deleted any medications: No Arrival Time: 15:38 Any new allergies or adverse reactions: No Accompanied By: self Had a fall or experienced change in No Transfer Assistance: None activities of daily living that may affect Patient Identification Verified: Yes risk of falls: Secondary Verification Process Completed: Yes Signs or symptoms of abuse/neglect since last visito No Patient Requires Transmission-Based Precautions: No Hospitalized since last visit: No Implantable device outside of the clinic excluding No cellular tissue based products placed in the center since last visit: Has Dressing in Place as Prescribed: Yes Has Compression in Place as Prescribed: Yes Pain Present Now: No Electronic Signature(s) Signed: 08/23/2019 3:54:39 PM By: Benna Dunks Entered By: Benna Dunks on 08/23/2019 15:39:59 Taft, Amarius R. ([id number]) -------------------------------------------------------------------------------- Compression Therapy Details Patient Name: [name]. Date of Service: 08/23/2019 3:30 PM Medical Record Number: [id number] Patient Account Number: [id number] Date of Birth/Sex: Apr 02, 1945 (74 y.o. M) Treating RN: Huel Coventry Primary Care Rickie Gutierres: Etheleen Nicks Other Clinician: Referring Leira Regino: Etheleen Nicks Treating Tandi Hanko/Extender: Linwood Dibbles, HOYT Weeks in Treatment: 4 Compression Therapy  Performed for Wound Assessment: Wound #7 Right,Lateral Ankle Performed By: Clinician Huel Coventry, RN Compression Type: Three Layer Pre Treatment ABI: 1.2 Post Procedure Diagnosis Same as Pre-procedure Electronic Signature(s) Signed: 08/23/2019 3:58:26 PM By: Elliot Gurney, BSN, RN, CWS, Kim RN, BSN Entered By: Elliot Gurney, BSN, RN, CWS, Kim on 08/23/2019 15:56:35 Bonneville, Jorje Guild ([id number]) -------------------------------------------------------------------------------- Encounter Discharge Information Details Patient Name: [name]. Date of Service: 08/23/2019 3:30 PM Medical Record Number: [id number] Patient Account Number: [id number] Date of Birth/Sex: [birth year] (74 y.o. M) Treating RN: Huel Coventry Primary Care Kerry Odonohue: Etheleen Nicks Other Clinician: Referring Monquie Fulgham: Etheleen Nicks Treating Ashtyn Freilich/Extender: Linwood Dibbles, HOYT Weeks in Treatment: 4 Encounter Discharge Information Items Discharge Condition: Stable Ambulatory Status: Crutches Discharge Destination: Home Transportation: Private Auto Accompanied By: self Schedule Follow-up Appointment: Yes Clinical Summary of Care: Electronic Signature(s) Signed: 08/23/2019 3:58:26 PM By: Elliot Gurney, BSN, RN, CWS, Kim RN, BSN Entered By: Elliot Gurney, BSN, RN, CWS, Kim on 08/23/2019 15:57:47 Cristo, Jorje Guild ([id number]) -------------------------------------------------------------------------------- Lower Extremity Assessment Details Patient Name: [name], [name]. Date of Service: 08/23/2019 3:30 PM Medical Record Number: [id number] Patient Account Number: [id number] Date of Birth/Sex: 07/23/1945 (74 y.o. M) Treating RN: Huel Coventry Primary Care Dontario Evetts: Etheleen Nicks Other Clinician: Referring Ary Rudnick: Etheleen Nicks Treating Shynia Daleo/Extender: Linwood Dibbles, HOYT Weeks in Treatment: 4 Edema Assessment Assessed: [Left: No] [Right: No] Edema: [Left: Ye] [Right: s] Calf Left: Right: Point of Measurement: 32 cm From Medial Instep cm 42  cm Ankle Left: Right: Point of Measurement: 9 cm From Medial Instep cm 23.8 cm Vascular Assessment Pulses: Dorsalis Pedis Palpable: [Right:Yes] Posterior Tibial Palpable: [Right:Yes] Electronic Signature(s) Signed: 08/23/2019 3:54:39 PM By: Benna Dunks Signed: 08/23/2019 3:58:26 PM By: Elliot Gurney, BSN, RN, CWS, Kim RN, BSN Entered By: Benna Dunks on 08/23/2019 15:51:00 Stouffer, Jorje Guild ([id number]) -------------------------------------------------------------------------------- Multi Wound Chart Details Patient Name: [name]. Date of Service: 08/23/2019 3:30 PM Medical Record Number: [id number] Patient Account Number: [id number] Date of Birth/Sex: 1946-01-07 (74 y.o. M) Treating RN: Huel Coventry  Primary Care Jaevian Shean: Etheleen Nicks Other Clinician: Referring Tadarius Maland: Etheleen Nicks Treating Raybon Conard/Extender: Linwood Dibbles, HOYT Weeks in Treatment: 4 Vital Signs Height(in): 69 Pulse(bpm): 67 Weight(lbs): 320 Blood Pressure(mmHg): 143/54 Body Mass Index(BMI): 47 Temperature(F): 98.6 Respiratory Rate(breaths/min): 20 Photos: [N/A:N/A] Wound Location: Right, Lateral Ankle N/A N/A Wounding Event: Blister N/A N/A Primary Etiology: Venous Leg Ulcer N/A N/A Comorbid History: Anemia, Sleep Apnea, N/A N/A Hypertension, Osteoarthritis Date Acquired: 06/20/2019 N/A N/A Weeks of Treatment: 4 N/A N/A Wound Status: Open N/A N/A Measurements L x W x D (cm) 1.8x1x0.2 N/A N/A Area (cm) : 1.414 N/A N/A Volume (cm) : 0.283 N/A N/A % Reduction in Area: 73.40% N/A N/A % Reduction in Volume: 73.40% N/A N/A Classification: Full Thickness Without Exposed N/A N/A Support Structures Exudate Amount: Medium N/A N/A Exudate Type: Serous N/A N/A Exudate Color: amber N/A N/A Wound Margin: Flat and Intact N/A N/A Granulation Amount: Large (67-100%) N/A N/A Granulation Quality: Pink N/A N/A Necrotic Amount: Small (1-33%) N/A N/A Necrotic Tissue: Eschar, Adherent Slough N/A N/A Exposed  Structures: Fat Layer (Subcutaneous Tissue) N/A N/A Exposed: Yes Fascia: No Tendon: No Muscle: No Joint: No Bone: No Epithelialization: None N/A N/A Treatment Notes Electronic Signature(s) Signed: 08/23/2019 3:58:26 PM By: Elliot Gurney, BSN, RN, CWS, Kim RN, BSN Entered By: Elliot Gurney, BSN, RN, CWS, Kim on 08/23/2019 15:55:04 Floresca, Jorje Guild ([id number]) -------------------------------------------------------------------------------- Multi-Disciplinary Care Plan Details Patient Name: [name]. Date of Service: 08/23/2019 3:30 PM Medical Record Number: [id number] Patient Account Number: [id number] Date of Birth/Sex: [birth year] (74 y.o. M) Treating RN: Huel Coventry Primary Care Treacy Holcomb: Etheleen Nicks Other Clinician: Referring Nell Gales: Etheleen Nicks Treating Marrell Dicaprio/Extender: Linwood Dibbles, HOYT Weeks in Treatment: 4 Active Inactive Necrotic Tissue Nursing Diagnoses: Impaired tissue integrity related to necrotic/devitalized tissue Goals: Necrotic/devitalized tissue will be minimized in the wound bed Date Initiated: 07/20/2019 Target Resolution Date: 08/19/2019 Goal Status: Active Interventions: Assess patient pain level pre-, during and post procedure and prior to discharge Treatment Activities: Apply topical anesthetic as ordered : 07/20/2019 Notes: Orientation to the Wound Care Program Nursing Diagnoses: Knowledge deficit related to the wound healing center program Goals: Patient/caregiver will verbalize understanding of the Wound Healing Center Program Date Initiated: 07/20/2019 Target Resolution Date: 08/19/2019 Goal Status: Active Interventions: Provide education on orientation to the wound center Notes: Pain, Acute or Chronic Nursing Diagnoses: Pain Management - Non-cyclic Acute (Procedural) Goals: Patient will verbalize adequate pain control and receive pain control interventions during procedures as needed Date Initiated: 07/20/2019 Target Resolution Date:  08/19/2019 Goal Status: Active Interventions: Assess comfort goal upon admission Notes: Venous Leg Ulcer Nursing Diagnoses: Knowledge deficit related to disease process and management Goals: Patient will maintain optimal edema control [name], [name] ([id number]) Date Initiated: 07/20/2019 Target Resolution Date: 08/19/2019 Goal Status: Active Interventions: Assess peripheral edema status every visit. Compression as ordered Treatment Activities: Therapeutic compression applied : 07/20/2019 Notes: Wound/Skin Impairment Nursing Diagnoses: Knowledge deficit related to ulceration/compromised skin integrity Goals: Ulcer/skin breakdown will have a volume reduction of 30% by week 4 Date Initiated: 07/20/2019 Target Resolution Date: 08/19/2019 Goal Status: Active Interventions: Assess patient/caregiver ability to obtain necessary supplies Provide education on ulcer and skin care Treatment Activities: Skin care regimen initiated : 07/20/2019 Notes: Electronic Signature(s) Signed: 08/23/2019 3:58:26 PM By: Elliot Gurney, BSN, RN, CWS, Kim RN, BSN Entered By: Elliot Gurney, BSN, RN, CWS, Kim on 08/23/2019 15:54:49 Stemler, Jorje Guild ([id number]) -------------------------------------------------------------------------------- Pain Assessment Details Patient Name: [name] Pen R. Date of Service: 08/23/2019 3:30 PM Medical Record Number: [id number] Patient Account Number: [id number] Date  of Birth/Sex: 1945/03/01 (73 y.o. M) Treating RN: Huel Coventry Primary Care Eilam Shrewsbury: Etheleen Nicks Other Clinician: Referring Savana Spina: Etheleen Nicks Treating Marv Alfrey/Extender: Linwood Dibbles, HOYT Weeks in Treatment: 4 Active Problems Location of Pain Severity and Description of Pain Patient Has Paino No Site Locations With Dressing Change: No Pain Management and Medication Current Pain Management: Electronic Signature(s) Signed: 08/23/2019 3:54:39 PM By: Benna Dunks Signed: 08/23/2019 3:58:26 PM By: Elliot Gurney, BSN, RN,  CWS, Kim RN, BSN Entered By: Benna Dunks on 08/23/2019 15:40:40 [name] ([id number]) -------------------------------------------------------------------------------- Patient/Caregiver Education Details Patient Name: [name]. Date of Service: 08/23/2019 3:30 PM Medical Record Number: [id number] Patient Account Number: [id number] Date of Birth/Gender: 10/08/1945 (74 y.o. M) Treating RN: Huel Coventry Primary Care Physician: Etheleen Nicks Other Clinician: Referring Physician: Etheleen Nicks Treating Physician/Extender: Skeet Simmer in Treatment: 4 Education Assessment Education Provided To: Patient Education Topics Provided Wound/Skin Impairment: Handouts: Caring for Your Ulcer Methods: Demonstration, Explain/Verbal Responses: State content correctly Electronic Signature(s) Signed: 08/23/2019 3:58:26 PM By: Elliot Gurney, BSN, RN, CWS, Kim RN, BSN Entered By: Elliot Gurney, BSN, RN, CWS, Kim on 08/23/2019 15:57:04 Fontan, Jorje Guild ([id number]) -------------------------------------------------------------------------------- Wound Assessment Details Patient Name: [name]. Date of Service: 08/23/2019 3:30 PM Medical Record Number: [id number] Patient Account Number: [id number] Date of Birth/Sex: April 19, 1945 (74 y.o. M) Treating RN: Huel Coventry Primary Care Harshil Cavallaro: Etheleen Nicks Other Clinician: Referring Nicolette Gieske: Etheleen Nicks Treating Channing Yeager/Extender: Linwood Dibbles, HOYT Weeks in Treatment: 4 Wound Status Wound Number: 7 Primary Etiology: Venous Leg Ulcer Wound Location: Right, Lateral Ankle Wound Status: Open Wounding Event: Blister Comorbid History: Anemia, Sleep Apnea, Hypertension, Osteoarthritis Date Acquired: 06/20/2019 Weeks Of Treatment: 4 Clustered Wound: No Photos Wound Measurements Length: (cm) 1.8 Width: (cm) 1 Depth: (cm) 0.2 Area: (cm) 1.414 Volume: (cm) 0.283 % Reduction in Area: 73.4% % Reduction in Volume: 73.4% Epithelialization:  None Wound Description Classification: Full Thickness Without Exposed Support Structu Wound Margin: Flat and Intact Exudate Amount: Medium Exudate Type: Serous Exudate Color: amber res Foul Odor After Cleansing: No Slough/Fibrino Yes Wound Bed Granulation Amount: Large (67-100%) Exposed Structure Granulation Quality: Pink Fascia Exposed: No Necrotic Amount: Small (1-33%) Fat Layer (Subcutaneous Tissue) Exposed: Yes Necrotic Quality: Eschar, Adherent Slough Tendon Exposed: No Muscle Exposed: No Joint Exposed: No Bone Exposed: No Treatment Notes Wound #7 (Right, Lateral Ankle) Notes hblue, abd, 3 layer right, unna to anchor Electronic Signature(s) Signed: 08/23/2019 3:54:39 PM By: Ashley Jacobs ([id number]) Signed: 08/23/2019 3:58:26 PM By: Elliot Gurney, BSN, RN, CWS, Kim RN, BSN Entered By: Benna Dunks on 08/23/2019 15:50:44 Coltrane, Jorje Guild ([id number]) -------------------------------------------------------------------------------- Vitals Details Patient Name: [name]. Date of Service: 08/23/2019 3:30 PM Medical Record Number: [id number] Patient Account Number: [id number] Date of Birth/Sex: 07-27-1945 (74 y.o. M) Treating RN: Huel Coventry Primary Care Myrtle Barnhard: Etheleen Nicks Other Clinician: Referring Lizvette Lightsey: Etheleen Nicks Treating Mckinnon Glick/Extender: Linwood Dibbles, HOYT Weeks in Treatment: 4 Vital Signs Time Taken: 03:30 Temperature (F): 98.6 Height (in): 69 Pulse (bpm): 67 Weight (lbs): 320 Respiratory Rate (breaths/min): 20 Body Mass Index (BMI): 47.3 Blood Pressure (mmHg): 143/54 Reference Range: 80 - 120 mg / dl Electronic Signature(s) Signed: 08/23/2019 3:54:39 PM By: Benna Dunks Entered By: Benna Dunks on 08/23/2019 15:40:30

## 2019-08-29 ENCOUNTER — Other Ambulatory Visit: Payer: Self-pay

## 2019-08-29 ENCOUNTER — Encounter: Payer: Medicare Other | Attending: Physician Assistant | Admitting: Physician Assistant

## 2019-08-29 DIAGNOSIS — Z6841 Body Mass Index (BMI) 40.0 and over, adult: Secondary | ICD-10-CM | POA: Diagnosis not present

## 2019-08-29 DIAGNOSIS — G4733 Obstructive sleep apnea (adult) (pediatric): Secondary | ICD-10-CM | POA: Diagnosis not present

## 2019-08-29 DIAGNOSIS — I10 Essential (primary) hypertension: Secondary | ICD-10-CM | POA: Insufficient documentation

## 2019-08-29 DIAGNOSIS — L97312 Non-pressure chronic ulcer of right ankle with fat layer exposed: Secondary | ICD-10-CM | POA: Diagnosis not present

## 2019-08-29 DIAGNOSIS — I87331 Chronic venous hypertension (idiopathic) with ulcer and inflammation of right lower extremity: Secondary | ICD-10-CM | POA: Diagnosis present

## 2019-08-29 NOTE — Progress Notes (Signed)
BILLY, TURVEY ([id number]) Visit Report for 08/29/2019 Arrival Information Details Patient Name: [name], [name]. Date of Service: 08/29/2019 2:45 PM Medical Record Number: [id number] Patient Account Number: [id number] Date of Birth/Sex: 1945/09/03 (74 y.o. M) Treating RN: Huel Coventry Primary Care Jahanna Raether: Etheleen Nicks Other Clinician: Referring Neville Pauls: Etheleen Nicks Treating Jonell Brumbaugh/Extender: Linwood Dibbles, HOYT Weeks in Treatment: 5 Visit Information History Since Last Visit Added or deleted any medications: No Patient Arrived: Ambulatory Any new allergies or adverse reactions: No Arrival Time: 14:52 Had a fall or experienced change in No Accompanied By: wife activities of daily living that may affect Transfer Assistance: None risk of falls: Patient Requires Transmission-Based Precautions: No Signs or symptoms of abuse/neglect since last visito No Hospitalized since last visit: No Implantable device outside of the clinic excluding No cellular tissue based products placed in the center since last visit: Has Dressing in Place as Prescribed: Yes Has Compression in Place as Prescribed: Yes Pain Present Now: No Electronic Signature(s) Signed: 08/29/2019 4:19:39 PM By: Tyler Aas Entered By: Tyler Aas on 08/29/2019 14:55:35 Leaver, [name] ([id number]) -------------------------------------------------------------------------------- Compression Therapy Details Patient Name: [name]. Date of Service: 08/29/2019 2:45 PM Medical Record Number: [id number] Patient Account Number: [id number] Date of Birth/Sex: [birth year] (74 y.o. M) Treating RN: Rodell Perna Primary Care Teryn Boerema: Etheleen Nicks Other Clinician: Referring Sharisa Toves: Etheleen Nicks Treating Matylda Fehring/Extender: Linwood Dibbles, HOYT Weeks in Treatment: 5 Compression Therapy Performed for Wound Assessment: Wound #7 Right,Lateral Ankle Performed By: Clinician Rodell Perna, RN Compression Type: Three  Layer Post Procedure Diagnosis Same as Pre-procedure Electronic Signature(s) Signed: 08/29/2019 4:16:06 PM By: Rodell Perna Entered By: Rodell Perna on 08/29/2019 15:24:03 [place], [name] ([id number]) -------------------------------------------------------------------------------- Encounter Discharge Information Details Patient Name: [name]. Date of Service: 08/29/2019 2:45 PM Medical Record Number: [id number] Patient Account Number: [id number] Date of Birth/Sex: Nov 21, 1945 (74 y.o. M) Treating RN: Rodell Perna Primary Care Keyona Emrich: Etheleen Nicks Other Clinician: Referring Leelyn Jasinski: Etheleen Nicks Treating Wynne Jury/Extender: Linwood Dibbles, HOYT Weeks in Treatment: 5 Encounter Discharge Information Items Post Procedure Vitals Discharge Condition: Stable Temperature (F): 98.4 Ambulatory Status: Ambulatory Pulse (bpm): 58 Discharge Destination: Home Respiratory Rate (breaths/min): 16 Transportation: Private Auto Blood Pressure (mmHg): 147/62 Accompanied By: wife Schedule Follow-up Appointment: Yes Clinical Summary of Care: Electronic Signature(s) Signed: 08/29/2019 4:16:06 PM By: Rodell Perna Entered By: Rodell Perna on 08/29/2019 15:25:06 [place]inas, [name] ([id number]) -------------------------------------------------------------------------------- Lower Extremity Assessment Details Patient Name: [name]. Date of Service: 08/29/2019 2:45 PM Medical Record Number: [id number] Patient Account Number: [id number] Date of Birth/Sex: 1946-01-09 (74 y.o. M) Treating RN: Tyler Aas Primary Care Nashea Chumney: Etheleen Nicks Other Clinician: Referring Levell Tavano: Etheleen Nicks Treating Hadyn Azer/Extender: Linwood Dibbles, HOYT Weeks in Treatment: 5 Edema Assessment Assessed: [Left: No] [Right: Yes] Edema: [Left: Ye] [Right: s] Calf Left: Right: Point of Measurement: cm From Medial Instep cm 35 cm Ankle Left: Right: Point of Measurement: cm From Medial Instep cm 24 cm Vascular  Assessment Pulses: Dorsalis Pedis Palpable: [Right:Yes] Posterior Tibial Palpable: [Right:Yes] Electronic Signature(s) Signed: 08/29/2019 4:19:39 PM By: Tyler Aas Entered By: Tyler Aas on 08/29/2019 15:08:47 Parodi, [name] ([id number]) -------------------------------------------------------------------------------- Multi Wound Chart Details Patient Name: [name] Pen R. Date of Service: 08/29/2019 2:45 PM Medical Record Number: [id number] Patient Account Number: [id number] Date of Birth/Sex: [birth year] (74 y.o. M) Treating RN: Rodell Perna Primary Care Artavious Trebilcock: Etheleen Nicks Other Clinician: Referring Elvy Mclarty: Etheleen Nicks Treating Skylynn Burkley/Extender: Linwood Dibbles, HOYT Weeks in Treatment: 5 Vital Signs Height(in): 69 Pulse(bpm): 58 Weight(lbs): 320 Blood Pressure(mmHg): 147/62 Body Mass Index(BMI):  47 Temperature(F): 98.4 Respiratory Rate(breaths/min): 18 Photos: [N/A:N/A] Wound Location: Right, Lateral Ankle N/A N/A Wounding Event: Blister N/A N/A Primary Etiology: Venous Leg Ulcer N/A N/A Comorbid History: Anemia, Sleep Apnea, N/A N/A Hypertension, Osteoarthritis Date Acquired: 06/20/2019 N/A N/A Weeks of Treatment: 5 N/A N/A Wound Status: Open N/A N/A Measurements L x W x D (cm) 1.7x0.9x0.2 N/A N/A Area (cm) : 1.202 N/A N/A Volume (cm) : 0.24 N/A N/A % Reduction in Area: 77.40% N/A N/A % Reduction in Volume: 77.40% N/A N/A Classification: Full Thickness Without Exposed N/A N/A Support Structures Exudate Amount: Medium N/A N/A Exudate Type: Serous N/A N/A Exudate Color: amber N/A N/A Wound Margin: Flat and Intact N/A N/A Granulation Amount: Small (1-33%) N/A N/A Granulation Quality: Pink N/A N/A Necrotic Amount: Medium (34-66%) N/A N/A Necrotic Tissue: Eschar, Adherent Slough N/A N/A Exposed Structures: Fat Layer (Subcutaneous Tissue) N/A N/A Exposed: Yes Fascia: No Tendon: No Muscle: No Joint: No Bone: No Epithelialization: Medium  (34-66%) N/A N/A Treatment Notes Electronic Signature(s) Signed: 08/29/2019 4:16:06 PM By: Rodell Perna Entered By: Rodell Perna on 08/29/2019 15:22:09 Mack, [name] ([id number]) -------------------------------------------------------------------------------- Multi-Disciplinary Care Plan Details Patient Name: [name]. Date of Service: 08/29/2019 2:45 PM Medical Record Number: [id number] Patient Account Number: [id number] Date of Birth/Sex: 11/25/1945 (74 y.o. M) Treating RN: Rodell Perna Primary Care Kyler Germer: Etheleen Nicks Other Clinician: Referring Diona Peregoy: Etheleen Nicks Treating Latoyna Hird/Extender: Linwood Dibbles, HOYT Weeks in Treatment: 5 Active Inactive Necrotic Tissue Nursing Diagnoses: Impaired tissue integrity related to necrotic/devitalized tissue Goals: Necrotic/devitalized tissue will be minimized in the wound bed Date Initiated: 07/20/2019 Target Resolution Date: 08/19/2019 Goal Status: Active Interventions: Assess patient pain level pre-, during and post procedure and prior to discharge Treatment Activities: Apply topical anesthetic as ordered : 07/20/2019 Notes: Orientation to the Wound Care Program Nursing Diagnoses: Knowledge deficit related to the wound healing center program Goals: Patient/caregiver will verbalize understanding of the Wound Healing Center Program Date Initiated: 07/20/2019 Target Resolution Date: 08/19/2019 Goal Status: Active Interventions: Provide education on orientation to the wound center Notes: Pain, Acute or Chronic Nursing Diagnoses: Pain Management - Non-cyclic Acute (Procedural) Goals: Patient will verbalize adequate pain control and receive pain control interventions during procedures as needed Date Initiated: 07/20/2019 Target Resolution Date: 08/19/2019 Goal Status: Active Interventions: Assess comfort goal upon admission Notes: Venous Leg Ulcer Nursing Diagnoses: Knowledge deficit related to disease process and  management Goals: Patient will maintain optimal edema control HARACE, MCCLUNEY ([id number]) Date Initiated: 07/20/2019 Target Resolution Date: 08/19/2019 Goal Status: Active Interventions: Assess peripheral edema status every visit. Compression as ordered Treatment Activities: Therapeutic compression applied : 07/20/2019 Notes: Wound/Skin Impairment Nursing Diagnoses: Knowledge deficit related to ulceration/compromised skin integrity Goals: Ulcer/skin breakdown will have a volume reduction of 30% by week 4 Date Initiated: 07/20/2019 Target Resolution Date: 08/19/2019 Goal Status: Active Interventions: Assess patient/caregiver ability to obtain necessary supplies Provide education on ulcer and skin care Treatment Activities: Skin care regimen initiated : 07/20/2019 Notes: Electronic Signature(s) Signed: 08/29/2019 4:16:06 PM By: Rodell Perna Entered By: Rodell Perna on 08/29/2019 15:22:01 Kite, [name] ([id number]) -------------------------------------------------------------------------------- Pain Assessment Details Patient Name: [name]. Date of Service: 08/29/2019 2:45 PM Medical Record Number: [id number] Patient Account Number: [id number] Date of Birth/Sex: 1945-06-01 (74 y.o. M) Treating RN: Tyler Aas Primary Care Gualberto Wahlen: Etheleen Nicks Other Clinician: Referring Lenise Jr: Etheleen Nicks Treating Ashraf Mesta/Extender: Linwood Dibbles, HOYT Weeks in Treatment: 5 Active Problems Location of Pain Severity and Description of Pain Patient Has Paino No Site Locations Pain Management and  Medication Current Pain Management: Electronic Signature(s) Signed: 08/29/2019 4:19:39 PM By: Tyler Aas Entered By: Tyler Aas on 08/29/2019 14:56:04 Millea, [name] ([id number]) -------------------------------------------------------------------------------- Patient/Caregiver Education Details Patient Name: [name]. Date of Service: 08/29/2019 2:45 PM Medical  Record Number: [id number] Patient Account Number: [id number] Date of Birth/Gender: 1945-06-08 (74 y.o. M) Treating RN: Rodell Perna Primary Care Physician: Etheleen Nicks Other Clinician: Referring Physician: Etheleen Nicks Treating Physician/Extender: Skeet Simmer in Treatment: 5 Education Assessment Education Provided To: Patient Education Topics Provided Wound/Skin Impairment: Handouts: Caring for Your Ulcer Methods: Demonstration, Explain/Verbal Responses: State content correctly Electronic Signature(s) Signed: 08/29/2019 4:16:06 PM By: Rodell Perna Entered By: Rodell Perna on 08/29/2019 15:24:26 Huskins, [name] ([id number]) -------------------------------------------------------------------------------- Wound Assessment Details Patient Name: [name]. Date of Service: 08/29/2019 2:45 PM Medical Record Number: [id number] Patient Account Number: [id number] Date of Birth/Sex: 10/02/1945 (74 y.o. M) Treating RN: Tyler Aas Primary Care Kashayla Ungerer: Etheleen Nicks Other Clinician: Referring Zenon Leaf: Etheleen Nicks Treating Asianae Minkler/Extender: Linwood Dibbles, HOYT Weeks in Treatment: 5 Wound Status Wound Number: 7 Primary Etiology: Venous Leg Ulcer Wound Location: Right, Lateral Ankle Wound Status: Open Wounding Event: Blister Comorbid History: Anemia, Sleep Apnea, Hypertension, Osteoarthritis Date Acquired: 06/20/2019 Weeks Of Treatment: 5 Clustered Wound: No Photos Wound Measurements Length: (cm) 1.7 Width: (cm) 0.9 Depth: (cm) 0.2 Area: (cm) 1.202 Volume: (cm) 0.24 % Reduction in Area: 77.4% % Reduction in Volume: 77.4% Epithelialization: Medium (34-66%) Tunneling: No Undermining: No Wound Description Classification: Full Thickness Without Exposed Support Structu Wound Margin: Flat and Intact Exudate Amount: Medium Exudate Type: Serous Exudate Color: amber res Foul Odor After Cleansing: No Slough/Fibrino Yes Wound Bed Granulation Amount: Small  (1-33%) Exposed Structure Granulation Quality: Pink Fascia Exposed: No Necrotic Amount: Medium (34-66%) Fat Layer (Subcutaneous Tissue) Exposed: Yes Necrotic Quality: Eschar, Adherent Slough Tendon Exposed: No Muscle Exposed: No Joint Exposed: No Bone Exposed: No Treatment Notes Wound #7 (Right, Lateral Ankle) Notes hblue, abd, 3 layer right, unna to anchor Electronic Signature(s) Signed: 08/29/2019 4:19:39 PM By: Mancel Parsons, [name] ([id number]) Entered By: Tyler Aas on 08/29/2019 15:08:11 [place]erson, [name] ([id number]) -------------------------------------------------------------------------------- Vitals Details Patient Name: [name]. Date of Service: 08/29/2019 2:45 PM Medical Record Number: [id number] Patient Account Number: [id number] Date of Birth/Sex: 02-15-1945 (74 y.o. M) Treating RN: Huel Coventry Primary Care Kristel Durkee: Etheleen Nicks Other Clinician: Referring Indi Willhite: Etheleen Nicks Treating Chelle Cayton/Extender: Linwood Dibbles, HOYT Weeks in Treatment: 5 Vital Signs Time Taken: 14:50 Temperature (F): 98.4 Height (in): 69 Pulse (bpm): 58 Weight (lbs): 320 Respiratory Rate (breaths/min): 18 Body Mass Index (BMI): 47.3 Blood Pressure (mmHg): 147/62 Reference Range: 80 - 120 mg / dl Electronic Signature(s) Signed: 08/29/2019 4:19:39 PM By: Tyler Aas Entered By: Tyler Aas on 08/29/2019 14:55:50

## 2019-08-29 NOTE — Progress Notes (Addendum)
MORGON, PAMER ([id number]) Visit Report for 08/29/2019 Chief Complaint Document Details Patient Name: [name], [name]. Date of Service: 08/29/2019 2:45 PM Medical Record Number: [id number] Patient Account Number: [id number] Date of Birth/Sex: Dec 15, 1945 (73 y.o. M) Treating RN: Huel Coventry Primary Care Provider: Etheleen Nicks Other Clinician: Referring Provider: Etheleen Nicks Treating Provider/Extender: Linwood Dibbles, Lanore Renderos Weeks in Treatment: 5 Information Obtained from: Patient Chief Complaint Right lateral ankle ulcer 07/20/2019; patient is here for a review of the wound on the right lateral ankle Electronic Signature(s) Signed: 08/29/2019 3:17:21 PM By: Lenda Kelp PA-C Entered By: Lenda Kelp on 08/29/2019 15:17:20 [name], [name] ([id number]) -------------------------------------------------------------------------------- Debridement Details Patient Name: [name] Pen R. Date of Service: 08/29/2019 2:45 PM Medical Record Number: [id number] Patient Account Number: [id number] Date of Birth/Sex: October 05, 1945 (74 y.o. M) Treating RN: Rodell Perna Primary Care Provider: Etheleen Nicks Other Clinician: Referring Provider: Etheleen Nicks Treating Provider/Extender: Linwood Dibbles, Khandi Kernes Weeks in Treatment: 5 Debridement Performed for Wound #7 Right,Lateral Ankle Assessment: Performed By: Physician STONE III, Mclean Moya E., PA-C Debridement Type: Debridement Severity of Tissue Pre Debridement: Fat layer exposed Level of Consciousness (Pre- Awake and Alert procedure): Pre-procedure Verification/Time Out Yes - 15:20 Taken: Start Time: 15:21 Pain Control: Lidocaine Total Area Debrided (L x W): 1.7 (cm) x 0.9 (cm) = 1.53 (cm) Tissue and other material Viable, Non-Viable, Slough, Subcutaneous, Slough debrided: Level: Skin/Subcutaneous Tissue Debridement Description: Excisional Instrument: Curette Bleeding: Minimum End Time: 15:24 Response to Treatment: Procedure was tolerated  well Level of Consciousness (Post- Awake and Alert procedure): Post Debridement Measurements of Total Wound Length: (cm) 1.7 Width: (cm) 0.9 Depth: (cm) 0.2 Volume: (cm) 0.24 Character of Wound/Ulcer Post Debridement: Stable Severity of Tissue Post Debridement: Fat layer exposed Post Procedure Diagnosis Same as Pre-procedure Electronic Signature(s) Signed: 08/29/2019 4:16:06 PM By: Rodell Perna Signed: 08/29/2019 5:38:45 PM By: Lenda Kelp PA-C Entered By: Rodell Perna on 08/29/2019 15:22:52 [name], [name] ([id number]) -------------------------------------------------------------------------------- HPI Details Patient Name: [name] Alma. Date of Service: 08/29/2019 2:45 PM Medical Record Number: [id number] Patient Account Number: [id number] Date of Birth/Sex: 27-Feb-1945 (74 y.o. M) Treating RN: Huel Coventry Primary Care Provider: Etheleen Nicks Other Clinician: Referring Provider: Etheleen Nicks Treating Provider/Extender: Linwood Dibbles, Jasun Gasparini Weeks in Treatment: 5 History of Present Illness HPI Description: As noted above the patient had a injury due to working in the yard and this rapidly progressed to a ulcerated area with an infection. In October 2014 he did have some vascular studies done but never saw a Careers adviser and never had any surgery for varicose veins. His past medical history is significant for hypertension, obstructive sleep apnea, morbid obesity, stasis dermatitis of both lower extremities and his past surgical history is suggestive of hip surgery on the right, gastric bypass surgery, cholecystectomy, hip replacement. He has recently been put on Levaquin 500 milligrams daily, Bactrim DS 1 twice a day and Bactroban 2% topical ointment locally. No recent labs or vascular or imaging studies done. 07/11/2014 a venous duplex study was done on 07/05/2014 -- it showed no evidence of deep vein thrombosis or superficial thrombosis bilaterally. There was incompetence of the  greater saphenous veins bilaterally and incompetence of the right small saphenous vein. The patient has an appointment to see the vascular surgeons on June 20. 07/18/2014 his appointment with the vascular surgeons is rescheduled for this afternoon. 07/25/2014 -- he saw the vascular surgeon and is going to have a endovenous procedure done on July 29. 08/01/2014 -- he was diagnosed with a UTI and  is on ciprofloxacin for 10 days. readmission: 11/27/15 On evaluation today patient is having discomfort in the right ankle region following an open wound from having dropped a cardboard box striking his ankle. He tells me that this has not healed despite many of the measures that he learned from previous injuries when he was seen at our wound center. Therefore he didn't come in for reevaluation today. He has not noted any significant purulent discharge at this point in time. 12/04/15 patient appears to be doing somewhat better at this point in time today in regard to his ankle wound. it is measuring smaller and looking cleaner. 12/11/15; patient wound continues to look improve this is on the right lateral ankle. Debrided of surface slough and nonviable subcutaneous tissue. Using silver alginate 12-18-15 Mr. Onstott presents today with his wife for follow-up on right lower extremity venous ulcer. He denies any changes or complications over the past week. He is tolerating compression therapy to his right lower extremity and has a properly fitting compression hose to the left lower extremity. 12/25/15; patient with a wound on his right lateral lower extremity in the setting of severe venous insufficiency [originally trauma against the box]. His edema is well controlled he is using Prisma. 01/01/16; continued improvement using Prisma. He has severe venous insufficiency however the wound was originally trauma against a box. 01/08/16; his wound is totally epithelialized. We will give him measurements for graded  pressure stockings which I think he is going to get at elastic therapy in [facility] Readmission: 10/30/17 patient presents today for reevaluation in our clinic although it has been since 2017 that we've last seen him. He has a ulcer on the right lateral ankle which he tells me began roughly 2 months ago. He states that he had one similar on the left ankle although this completely healed without any complication. However the one on the right as continue to give him a lot of trouble and is not healing appropriately. He really was not sure what the best thing to put on the area would be and therefore he's just been putting advantage to cover it. Another treatment has been utilized at this time. Fortunately there is not appear to be any evidence of significant infection necessarily although there is some evidence of abnormal discoloration in regard to the base of the wound that does not look as healthy as I was on the lives life. He does have erythema surrounding but I'm not sure if this is just due to chronic venous stasis/lymphedema or if it truly is anything infection wise going on at this point. Nonetheless in general the patient seems to be doing fairly well he's not having too much pain although he does have some discomfort. No fevers, chills, nausea, or vomiting noted at this time. 11/13/17 on evaluation today patient actually appears to be doing much better in regard to his wound currently. There does seem to be evidence of good improvement there still a lot of maceration and some necrotic tissue on the surface of the wound but this was cleaned away very well today. Overall I'm very pleased with how things seem to be progressing in general. We did get the results of his culture back which showed that he did have evidence of Staphylococcus aureus as well as group B strep. It does appear that the Bactrim was sensitive for both. The wound again does look better. 11/20/17 on evaluation  today patient's right lateral ankle ulcer actually appears to be doing  excellent at this time. He has been tolerating the dressing changes without complication. With that being said overall I do feel like that he has made great progress in the few weeks we've been seeing him at this point. No fevers, chills, nausea, or vomiting noted at this time. 11/30/17 on evaluation today patient appears to be doing extremely well at this point in regard to his right lateral ankle ulcer. He has been tolerating the dressing changes without complication. In general I've been very pleased with the overall progress. His wound overview chart shows that he is making steady progress towards closure. 12/07/17 on evaluation today patient actually appears to be doing well in regard to his ulcer. He does have somewhat of an irregular heart rate noted at this point this was confirmed by manual checks as well. However it was not as low as the initial reading by the machine which was around 48 he was closer to around 58-62. Nonetheless it does sound as if you may have a little bit of an irregularity possibly a field although it was somewhat slow and difficult to gauge the exact rhythm. Otherwise his wound itself seems to be showing signs of great improvement with Laakso, Dyron R. ([id number]) good epithelialization. He's tolerating the Iodoflex well. 12/14/17 on evaluation today patient continues to show signs of great improvement in my pinion. Overall I'm very pleased with the progress that is made. With that being said though he has shown improvement he tells me that for the first couple of days after we put his wrap on that he has severe pain for pretty much that entire amount of time. Nonetheless at this time I'm not seeing any evidence of infection which is good news. I think this may actually be coming from the actual dressing itself. That is the Iodoflex. 12/22/17 on evaluation today patient actually appears to be doing  much better in regard to his ulcer. He's been shown signs of improvement which is great news. Fortunately there does not appear to be any evidence of infection at this time. Overall I feel like he has shown excellent progress even since just last week. 12/29/17 on evaluation today patient actually appears to be doing excellent although he does have some of the dressing material stuck to the wound bed. Fortunately there does not appear to be any signs of infection which is great news. Overall I'm very pleased with the progression of his wound. 01/05/18 on evaluation today patient actually appears to be doing rather well in regard to his ankle ulcer. He has been tolerating the dressing changes without complication. Overall very pleased. He seems to be very close to healing. 01/14/18 on evaluation today patient actually appears to be doing excellent in regard to his right lateral lower extremity wound. He's been tolerating the dressing changes without complication. Fortunately he appears to be completely healed today. READMISSION 07/20/2019 This is a now 74 year old man that we have had on in this clinic on multiple occasions usually with a wound on the right lateral ankle in the setting of chronic venous hypertension. Looking at the notes from 2016 he had venous reflux studies saw vascular surgery and was supposed to have a procedure but I cannot see what procedure that was. He was last here in 2019 at which time he had an ulcer in this same area. He often describes minor trauma. On this occasion he simply saw 2 blisters one of the blisters opened and healed the other resulted in this wound he has  not been using his juxta lite stockings in fact according to his wife he use these for perhaps 2 or 3 months after he was last here but has not used them since. He does keep his legs elevated. He is a minimal ambulator walking with crutches secondary to left hip issues. Past medical history is reviewed he has  hypertension, obstructive sleep apnea on CPAP and O2, stasis dermatitis, hip history of a hip surgery, venous insufficiency with lymphedema. Urinary retention requiring a suprapubic catheter, panniculitis, status post Roux-en-Y gastric bypass ABIs in our clinic were 1.22 on the right and 1.17 on the left 6/30; right lateral ankle wound which is a recurrent problem in the setting of chronic venous hypertension. He had a procedure in 2016 I will need to look this up. We have been using Iodoflex under the wound under compression. No evidence of arterial insufficiency 08/02/2019 on evaluation today patient's wound actually showed signs of significant improvement which is great news. The Iodoflex does seem to be doing a great job keeping the necrotic tissue under control and in fact I was able to mechanically clean this away today without the use of sharp debridement which the patient tolerated in an excellent fashion. There is no evidence of infection currently. 7/14; the patient is returned from [place] on vacation. His wound actually looks somewhat better. He has been using Iodoflex under compression 7/21; wound looks about the same same measurements requires debridement we switch to [facility] last time. I looked in epic I cannot see that he actually had a venous procedure in 2016 although it is clearly listed in her notes and I heal at that time. It would seem that he did have some type of procedure but I cannot find out what it is. 08/23/2019 on evaluation today patient appears to be doing much better at this point. He has been tolerating the dressing changes without complication and overall I am extremely pleased with how things are progressing. There is no signs of active infection at this time. 08/29/2019 on evaluation today patient appears to be doing excellent in regard to his wound. He has been tolerating the dressing changes without complication. Fortunately there is no signs of active infection  at this time. No fevers, chills, nausea, vomiting, or diarrhea. Electronic Signature(s) Signed: 08/29/2019 3:27:53 PM By: Lenda Kelp PA-C Entered By: Lenda Kelp on 08/29/2019 15:27:53 [name], [name] ([id number]) -------------------------------------------------------------------------------- Physical Exam Details Patient Name: [name] Alma. Date of Service: 08/29/2019 2:45 PM Medical Record Number: [id number] Patient Account Number: [id number] Date of Birth/Sex: Sep 17, 1945 (74 y.o. M) Treating RN: Huel Coventry Primary Care Provider: Etheleen Nicks Other Clinician: Referring Provider: Etheleen Nicks Treating Provider/Extender: Linwood Dibbles, Bryttney Netzer Weeks in Treatment: 5 Constitutional Obese and well-hydrated in no acute distress. Respiratory normal breathing without difficulty. Psychiatric this patient is able to make decisions and demonstrates good insight into disease process. Alert and Oriented x 3. pleasant and cooperative. Notes His wound bed currently showed signs of excellent granulation and epithelization there was some slough noted on the surface of the wound which did require sharp debridement I performed debridement today without complication I did note that his wound bed was significantly improved post debridement and overall very pleased in that regard. I think he is headed in the correct direction. Electronic Signature(s) Signed: 08/29/2019 3:28:14 PM By: Lenda Kelp PA-C Entered By: Lenda Kelp on 08/29/2019 15:28:14 [name] Alma ([id number]) -------------------------------------------------------------------------------- Physician Orders Details Patient Name: [name] Alma. Date of  Service: 08/29/2019 2:45 PM Medical Record Number: [id number] Patient Account Number: [id number] Date of Birth/Sex: 21-Jul-1945 (74 y.o. M) Treating RN: Rodell Perna Primary Care Provider: Etheleen Nicks Other Clinician: Referring Provider: Etheleen Nicks Treating  Provider/Extender: Linwood Dibbles, Antionette Luster Weeks in Treatment: 5 Verbal / Phone Orders: No Diagnosis Coding ICD-10 Coding Code Description I87.331 Chronic venous hypertension (idiopathic) with ulcer and inflammation of right lower extremity L97.312 Non-pressure chronic ulcer of right ankle with fat layer exposed Wound Cleansing Wound #7 Right,Lateral Ankle o Cleanse wound with mild soap and water o May Shower, gently pat wound dry prior to applying new dressing. Anesthetic (add to Medication List) Wound #7 Right,Lateral Ankle o Topical Lidocaine 4% cream applied to wound bed prior to debridement (In Clinic Only). Primary Wound Dressing Wound #7 Right,Lateral Ankle o Hydrafera Blue Ready Transfer Secondary Dressing Wound #7 Right,Lateral Ankle o ABD pad Dressing Change Frequency Wound #7 Right,Lateral Ankle o Change dressing every week Follow-up Appointments Wound #7 Right,Lateral Ankle o Return Appointment in 1 week. o Nurse Visit as needed Edema Control Wound #7 Right,Lateral Ankle o 3 Layer Compression System - Right Lower Extremity - unna paste to anchor o Patient to wear own Velcro compression garment. - left o Elevate legs to the level of the heart and pump ankles as often as possible Additional Orders / Instructions Wound #7 Right,Lateral Ankle o Increase protein intake. o Activity as tolerated Electronic Signature(s) Signed: 08/29/2019 4:16:06 PM By: Rodell Perna Signed: 08/29/2019 5:38:45 PM By: Lenda Kelp PA-C Entered By: Rodell Perna on 08/29/2019 15:23:45 [name], [name] ([id number]) -------------------------------------------------------------------------------- Problem List Details Patient Name: [name] Alma. Date of Service: 08/29/2019 2:45 PM Medical Record Number: [id number] Patient Account Number: [id number] Date of Birth/Sex: [birth year] (74 y.o. M) Treating RN: Huel Coventry Primary Care Provider: Etheleen Nicks Other  Clinician: Referring Provider: Etheleen Nicks Treating Provider/Extender: Linwood Dibbles, Yuritzy Zehring Weeks in Treatment: 5 Active Problems ICD-10 Encounter Code Description Active Date MDM Diagnosis I87.331 Chronic venous hypertension (idiopathic) with ulcer and inflammation of 07/20/2019 No Yes right lower extremity L97.312 Non-pressure chronic ulcer of right ankle with fat layer exposed 07/20/2019 No Yes Inactive Problems Resolved Problems Electronic Signature(s) Signed: 08/29/2019 3:17:13 PM By: Lenda Kelp PA-C Entered By: Lenda Kelp on 08/29/2019 15:17:12 [name], [name] ([id number]) -------------------------------------------------------------------------------- Progress Note Details Patient Name: [name] Alma. Date of Service: 08/29/2019 2:45 PM Medical Record Number: [id number] Patient Account Number: [id number] Date of Birth/Sex: 06-29-1945 (74 y.o. M) Treating RN: Huel Coventry Primary Care Provider: Etheleen Nicks Other Clinician: Referring Provider: Etheleen Nicks Treating Provider/Extender: Linwood Dibbles, Aspin Palomarez Weeks in Treatment: 5 Subjective Chief Complaint Information obtained from Patient Right lateral ankle ulcer 07/20/2019; patient is here for a review of the wound on the right lateral ankle History of Present Illness (HPI) As noted above the patient had a injury due to working in the yard and this rapidly progressed to a ulcerated area with an infection. In October 2014 he did have some vascular studies done but never saw a Careers adviser and never had any surgery for varicose veins. His past medical history is significant for hypertension, obstructive sleep apnea, morbid obesity, stasis dermatitis of both lower extremities and his past surgical history is suggestive of hip surgery on the right, gastric bypass surgery, cholecystectomy, hip replacement. He has recently been put on Levaquin 500 milligrams daily, Bactrim DS 1 twice a day and Bactroban 2% topical ointment locally. No  recent labs or vascular or imaging studies done. 07/11/2014 a venous duplex study  was done on 07/05/2014 -- it showed no evidence of deep vein thrombosis or superficial thrombosis bilaterally. 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 The patient has an appointment to see the vascular surgeons on June 20. 07/18/2014 his appointment with the vascular surgeons is rescheduled for this afternoon. 07/25/2014 -- he saw the vascular surgeon and is going to have a endovenous procedure done on July 29. 08/01/2014 -- he was diagnosed with a UTI and is on ciprofloxacin for 10 days. readmission: 11/27/15 On evaluation today patient is having discomfort in the right ankle region following an open wound from having dropped a cardboard box striking his ankle. He tells me that this has not healed despite many of the measures that he learned from previous injuries when he was seen at our wound center. Therefore he didn't come in for reevaluation today. He has not noted any significant purulent discharge at this point in time. 12/04/15 patient appears to be doing somewhat better at this point in time today in regard to his ankle wound. it is measuring smaller and looking cleaner. 12/11/15; patient wound continues to look improve this is on the right lateral ankle. Debrided of surface slough and nonviable subcutaneous tissue. Using silver alginate 12-18-15 Mr. Korinek presents today with his wife for follow-up on right lower extremity venous ulcer. He denies any changes or complications over the past week. He is tolerating compression therapy to his right lower extremity and has a properly fitting compression hose to the left lower extremity. 12/25/15; patient with a wound on his right lateral lower extremity in the setting of severe venous insufficiency [originally trauma against the box]. His edema is well controlled he is using  Prisma. 01/01/16; continued improvement using Prisma. He has severe venous insufficiency however the wound was originally trauma against a box. 01/08/16; his wound is totally epithelialized. We will give him measurements for graded pressure stockings which I think he is going [facility] get [facility] Readmission: 10/30/17 patient presents today for reevaluation in our clinic although it has been since 2017 that we've last seen him. He has a ulcer on the right lateral ankle which he tells me began roughly 2 months ago. He states that he had one similar on the left ankle although this completely healed without any complication. However the one on the right as continue to give him a lot of trouble and is not healing appropriately. He really was not sure what the best thing to put on the area would be and therefore he's just been putting advantage to cover it. Another treatment has been utilized at this time. Fortunately there is not appear to be any evidence of significant infection necessarily although there is some evidence of abnormal discoloration in regard to the base of the wound that does not look as healthy as I was on the lives life. He does have erythema surrounding but I'm not sure if this is just due to chronic venous stasis/lymphedema or if it truly is anything infection wise going on at this point. Nonetheless in general the patient seems to be doing fairly well he's not having too much pain although he does have some discomfort. No fevers, chills, nausea, or vomiting noted at this time. 11/13/17 on evaluation today patient actually appears to be doing much better in regard to his wound currently. There does seem to be evidence of good improvement there still a lot of maceration  and some necrotic tissue on the surface of the wound but this was cleaned away very well today. Overall I'm very pleased with how things seem to be progressing in general. We did get the  results of his culture back which showed that he did have evidence of Staphylococcus aureus as well as group B strep. It does appear that the Bactrim was sensitive for both. The wound again does look better. 11/20/17 on evaluation today patient's right lateral ankle ulcer actually appears to be doing excellent at this time. He has been tolerating the dressing changes without complication. With that being said overall I do feel like that he has made great progress in the few weeks we've been seeing him at this point. No fevers, chills, nausea, or vomiting noted at this time. 11/30/17 on evaluation today patient appears to be doing extremely well at this point in regard to his right lateral ankle ulcer. He has been tolerating the dressing changes without complication. In general I've been very pleased with the overall progress. His wound overview chart shows that he is making steady progress towards closure. [name], [name] ([id number]) 12/07/17 on evaluation today patient actually appears to be doing well in regard to his ulcer. He does have somewhat of an irregular heart rate noted at this point this was confirmed by manual checks as well. However it was not as low as the initial reading by the machine which was around 48 he was closer to around 58-62. Nonetheless it does sound as if you may have a little bit of an irregularity possibly a field although it was somewhat slow and difficult to gauge the exact rhythm. Otherwise his wound itself seems to be showing signs of great improvement with good epithelialization. He's tolerating the Iodoflex well. 12/14/17 on evaluation today patient continues to show signs of great improvement in my pinion. Overall I'm very pleased with the progress that is made. With that being said though he has shown improvement he tells me that for the first couple of days after we put his wrap on that he has severe pain for pretty much that entire amount of time. Nonetheless  at this time I'm not seeing any evidence of infection which is good news. I think this may actually be coming from the actual dressing itself. That is the Iodoflex. 12/22/17 on evaluation today patient actually appears to be doing much better in regard to his ulcer. He's been shown signs of improvement which is great news. Fortunately there does not appear to be any evidence of infection at this time. Overall I feel like he has shown excellent progress even since just last week. 12/29/17 on evaluation today patient actually appears to be doing excellent although he does have some of the dressing material stuck to the wound bed. Fortunately there does not appear to be any signs of infection which is great news. Overall I'm very pleased with the progression of his wound. 01/05/18 on evaluation today patient actually appears to be doing rather well in regard to his ankle ulcer. He has been tolerating the dressing changes without complication. Overall very pleased. He seems to be very close to healing. 01/14/18 on evaluation today patient actually appears to be doing excellent in regard to his right lateral lower extremity wound. He's been tolerating the dressing changes without complication. Fortunately he appears to be completely healed today. READMISSION 07/20/2019 This is a now 74 year old man that we have had on in this clinic on multiple occasions usually with a  wound on the right lateral ankle in the setting of chronic venous hypertension. Looking at the notes from 2016 he had venous reflux studies saw vascular surgery and was supposed to have a procedure but I cannot see what procedure that was. He was last here in 2019 at which time he had an ulcer in this same area. He often describes minor trauma. On this occasion he simply saw 2 blisters one of the blisters opened and healed the other resulted in this wound he has not been using his juxta lite stockings in fact according to his wife he use  these for perhaps 2 or 3 months after he was last here but has not used them since. He does keep his legs elevated. He is a minimal ambulator walking with crutches secondary to left hip issues. Past medical history is reviewed he has hypertension, obstructive sleep apnea on CPAP and O2, stasis dermatitis, hip history of a hip surgery, venous insufficiency with lymphedema. Urinary retention requiring a suprapubic catheter, panniculitis, status post Roux-en-Y gastric bypass ABIs in our clinic were 1.22 on the right and 1.17 on the left 6/30; right lateral ankle wound which is a recurrent problem in the setting of chronic venous hypertension. He had a procedure in 2016 I will need to look this up. We have been using Iodoflex under the wound under compression. No evidence of arterial insufficiency 08/02/2019 on evaluation today patient's wound actually showed signs of significant improvement which is great news. The Iodoflex does seem to be doing a great job keeping the necrotic tissue under control and in fact I was able to mechanically clean this away today without the use of sharp debridement which the patient tolerated in an excellent fashion. There is no evidence of infection currently. 7/14; the patient is returned from [place] on vacation. His wound actually looks somewhat better. He has been using Iodoflex under compression 7/21; wound looks about the same same measurements requires debridement we switch to [facility] last time. I looked in epic I cannot see that he actually had a venous procedure in 2016 although it is clearly listed in her notes and I heal at that time. It would seem that he did have some type of procedure but I cannot find out what it is. 08/23/2019 on evaluation today patient appears to be doing much better at this point. He has been tolerating the dressing changes without complication and overall I am extremely pleased with how things are progressing. There is no signs of  active infection at this time. 08/29/2019 on evaluation today patient appears to be doing excellent in regard to his wound. He has been tolerating the dressing changes without complication. Fortunately there is no signs of active infection at this time. No fevers, chills, nausea, vomiting, or diarrhea. Objective Constitutional Obese and well-hydrated in no acute distress. Vitals Time Taken: 2:50 PM, Height: 69 in, Weight: 320 lbs, BMI: 47.3, Temperature: 98.4 F, Pulse: 58 bpm, Respiratory Rate: 18 breaths/min, Blood Pressure: 147/62 mmHg. Respiratory normal breathing without difficulty. Psychiatric [name], [name] ([id number]) this patient is able to make decisions and demonstrates good insight into disease process. Alert and Oriented x 3. pleasant and cooperative. General Notes: His wound bed currently showed signs of excellent granulation and epithelization there was some slough noted on the surface of the wound which did require sharp debridement I performed debridement today without complication I did note that his wound bed was significantly improved post debridement and overall very pleased in that regard.  I think he is headed in the correct direction. Integumentary (Hair, Skin) Wound #7 status is Open. Original cause of wound was Blister. The wound is located on the Right,Lateral Ankle. The wound measures 1.7cm length x 0.9cm width x 0.2cm depth; 1.202cm^2 area and 0.24cm^3 volume. There is Fat Layer (Subcutaneous Tissue) Exposed exposed. There is no tunneling or undermining noted. There is a medium amount of serous drainage noted. The wound margin is flat and intact. There is small (1-33%) pink granulation within the wound bed. There is a medium (34-66%) amount of necrotic tissue within the wound bed including Eschar and Adherent Slough. Assessment Active Problems ICD-10 Chronic venous hypertension (idiopathic) with ulcer and inflammation of right lower extremity Non-pressure  chronic ulcer of right ankle with fat layer exposed Procedures Wound #7 Pre-procedure diagnosis of Wound #7 is a Venous Leg Ulcer located on the Right,Lateral Ankle .Severity of Tissue Pre Debridement is: Fat layer exposed. There was a Excisional Skin/Subcutaneous Tissue Debridement with a total area of 1.53 sq cm performed by STONE III, Ivy Meriwether E., PA-C. With the following instrument(s): Curette to remove Viable and Non-Viable tissue/material. Material removed includes Subcutaneous Tissue and Slough and after achieving pain control using Lidocaine. A time out was conducted at 15:20, prior to the start of the procedure. A Minimum amount of bleeding was controlled with N/A. The procedure was tolerated well. Post Debridement Measurements: 1.7cm length x 0.9cm width x 0.2cm depth; 0.24cm^3 volume. Character of Wound/Ulcer Post Debridement is stable. Severity of Tissue Post Debridement is: Fat layer exposed. Post procedure Diagnosis Wound #7: Same as Pre-Procedure Pre-procedure diagnosis of Wound #7 is a Venous Leg Ulcer located on the Right,Lateral Ankle . There was a Three Layer Compression Therapy Procedure by Rodell Perna, RN. Post procedure Diagnosis Wound #7: Same as Pre-Procedure Plan Wound Cleansing: Wound #7 Right,Lateral Ankle: Cleanse wound with mild soap and water May Shower, gently pat wound dry prior to applying new dressing. Anesthetic (add to Medication List): Wound #7 Right,Lateral Ankle: Topical Lidocaine 4% cream applied to wound bed prior to debridement (In Clinic Only). Primary Wound Dressing: Wound #7 Right,Lateral Ankle: Hydrafera Blue Ready Transfer Secondary Dressing: Wound #7 Right,Lateral Ankle: ABD pad Dressing Change Frequency: Wound #7 Right,Lateral Ankle: Change dressing every week Follow-up Appointments: Wound #7 Right,Lateral Ankle: Return Appointment in 1 week. Nurse Visit as needed Edema Control: [name], [name] ([id number]) Wound #7 Right,Lateral  Ankle: 3 Layer Compression System - Right Lower Extremity - unna paste to anchor Patient to wear own Velcro compression garment. - left Elevate legs to the level of the heart and pump ankles as often as possible Additional Orders / Instructions: Wound #7 Right,Lateral Ankle: Increase protein intake. Activity as tolerated 1. I would recommend currently that we going to continue with the wound care measures as before specifically with regard to the [facility] which I do feel like has been of excellent benefit for him. 2. I am also can recommend that we go ahead and continue with the 3 layer compression wrap to the right lower extremity which I do feel like has also been of benefit. 3. I am also can recommend that we go ahead and have the patient continue to wear his own compression, Velcro wrap, on the left lower extremity. 4. He should continue to elevate his legs as well. We will see patient back for reevaluation in 1 week here in the clinic. If anything worsens or changes patient will contact our office for additional recommendations. Electronic Signature(s) Signed: 08/29/2019  3:29:01 PM By: Lenda Kelp PA-C Entered By: Lenda Kelp on 08/29/2019 15:29:01 [name], [name] ([id number]) -------------------------------------------------------------------------------- SuperBill Details Patient Name: [name] Alma. Date of Service: 08/29/2019 Medical Record Number: [id number] Patient Account Number: [id number] Date of Birth/Sex: January 09, 1946 (74 y.o. M) Treating RN: Huel Coventry Primary Care Provider: Etheleen Nicks Other Clinician: Referring Provider: Etheleen Nicks Treating Provider/Extender: Linwood Dibbles, Tejah Brekke Weeks in Treatment: 5 Diagnosis Coding ICD-10 Codes Code Description I87.331 Chronic venous hypertension (idiopathic) with ulcer and inflammation of right lower extremity L97.312 Non-pressure chronic ulcer of right ankle with fat layer exposed Facility Procedures CPT4 Code:  [id number] Description: 11042 - DEB SUBQ TISSUE 20 SQ CM/< Modifier: Quantity: 1 CPT4 Code: Description: ICD-10 Diagnosis Description L97.312 Non-pressure chronic ulcer of right ankle with fat layer exposed Modifier: Quantity: Physician Procedures CPT4 Code: [id number] Description: 11042 - WC PHYS SUBQ TISS 20 SQ CM Modifier: Quantity: 1 CPT4 Code: Description: ICD-10 Diagnosis Description L97.312 Non-pressure chronic ulcer of right ankle with fat layer exposed Modifier: Quantity: Electronic Signature(s) Signed: 08/29/2019 3:29:13 PM By: Lenda Kelp PA-C Entered By: Lenda Kelp on 08/29/2019 15:29:12

## 2019-09-06 ENCOUNTER — Ambulatory Visit: Payer: Medicare Other | Admitting: Physician Assistant

## 2019-09-07 ENCOUNTER — Other Ambulatory Visit: Payer: Self-pay

## 2019-09-07 ENCOUNTER — Encounter: Payer: Medicare Other | Admitting: Internal Medicine

## 2019-09-07 DIAGNOSIS — I87331 Chronic venous hypertension (idiopathic) with ulcer and inflammation of right lower extremity: Secondary | ICD-10-CM | POA: Diagnosis not present

## 2019-09-08 NOTE — Progress Notes (Signed)
[name], Nathin R. ([id number]) Visit Report for 09/07/2019 HPI Details Patient Name: [name], [name] Date of Service: 09/07/2019 2:45 PM Medical Record Number: [id number] Patient Account Number: [id number] Date of Birth/Sex: 03/01/1945 (74 y.o. M) Treating RN: Huel CoventryWoody, Kim Primary Care Provider: Etheleen NicksMACDONALD, KERI Other Clinician: Referring Provider: Etheleen NicksMACDONALD, KERI Treating Provider/Extender: Maryla MorrowMadduri, Adra Shepler Weeks in Treatment: 7 History of Present Illness HPI Description: As noted above the patient had a injury due to working in the yard and this rapidly progressed to a ulcerated area with an infection. In October 2014 he did have some vascular studies done but never saw a Careers advisersurgeon and never had any surgery for varicose veins. His past medical history is significant for hypertension, obstructive sleep apnea, morbid obesity, stasis dermatitis of both lower extremities and his past surgical history is suggestive of hip surgery on the right, gastric bypass surgery, cholecystectomy, hip replacement. He has recently been put on Levaquin 500 milligrams daily, Bactrim DS 1 twice a day and Bactroban 2% topical ointment locally. No recent labs or vascular or imaging studies done. 07/11/2014 a venous duplex study was done on 07/05/2014 -- it showed no evidence of deep vein thrombosis or superficial thrombosis bilaterally. 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 The patient has an appointment to see the vascular surgeons on June 20. 07/18/2014 his appointment with the vascular surgeons is rescheduled for this afternoon. 07/25/2014 -- he saw the vascular surgeon and is going to have a endovenous procedure done on July 29. 08/01/2014 -- he was diagnosed with a UTI and is on ciprofloxacin for 10 days. readmission: 11/27/15 On evaluation today patient is having discomfort in the right ankle region following an open wound from having  dropped a cardboard box striking his ankle. He tells me that this has not healed despite many of the measures that he learned from previous injuries when he was seen at our wound center. Therefore he didn't come in for reevaluation today. He has not noted any significant purulent discharge at this point in time. 12/04/15 patient appears to be doing somewhat better at this point in time today in regard to his ankle wound. it is measuring smaller and looking cleaner. 12/11/15; patient wound continues to look improve this is on the right lateral ankle. Debrided of surface slough and nonviable subcutaneous tissue. Using silver alginate 12-18-15 [name] presents today with his wife for follow-up on right lower extremity venous ulcer. He denies any changes or complications over the past week. He is tolerating compression therapy to his right lower extremity and has a properly fitting compression hose to the left lower extremity. 12/25/15; patient with a wound on his right lateral lower extremity in the setting of severe venous insufficiency [originally trauma against the box]. His edema is well controlled he is using Prisma. 01/01/16; continued improvement using Prisma. He has severe venous insufficiency however the wound was originally trauma against a box. 01/08/16; his wound is totally epithelialized. We will give him measurements for graded pressure stockings which I think he is going to get at elastic therapy in [facility] Readmission: 10/30/17 patient presents today for reevaluation in our clinic although it has been since 2017 that we've last seen him. He has a ulcer on the right lateral ankle which he tells me began roughly 2 months ago. He states that he had one similar on the left ankle although this completely healed without any complication. However the one on the right  as continue to give him a lot of trouble and is not healing appropriately. He really was not sure what the best  thing to put on the area would be and therefore he's just been putting advantage to cover it. Another treatment has been utilized at this time. Fortunately there is not appear to be any evidence of significant infection necessarily although there is some evidence of abnormal discoloration in regard to the base of the wound that does not look as healthy as I was on the lives life. He does have erythema surrounding but I'm not sure if this is just due to chronic venous stasis/lymphedema or if it truly is anything infection wise going on at this point. Nonetheless in general the patient seems to be doing fairly well he's not having too much pain although he does have some discomfort. No fevers, chills, nausea, or vomiting noted at this time. 11/13/17 on evaluation today patient actually appears to be doing much better in regard to his wound currently. There does seem to be evidence of good improvement there still a lot of maceration and some necrotic tissue on the surface of the wound but this was cleaned away very well today. Overall I'm very pleased with how things seem to be progressing in general. We did get the results of his culture back which showed that he did have evidence of Staphylococcus aureus as well as group B strep. It does appear that the Bactrim was sensitive for both. The wound again does look better. 11/20/17 on evaluation today patient's right lateral ankle ulcer actually appears to be doing excellent at this time. He has been tolerating the dressing changes without complication. With that being said overall I do feel like that he has made great progress in the few weeks we've been seeing him at this point. No fevers, chills, nausea, or vomiting noted at this time. 11/30/17 on evaluation today patient appears to be doing extremely well at this point in regard to his right lateral ankle ulcer. He has been tolerating the dressing changes without complication. In general I've been very  pleased with the overall progress. His wound overview chart shows that he is making steady progress towards closure. GURFATEH, MCCLAIN ([id number]) 12/07/17 on evaluation today patient actually appears to be doing well in regard to his ulcer. He does have somewhat of an irregular heart rate noted at this point this was confirmed by manual checks as well. However it was not as low as the initial reading by the machine which was around 48 he was closer to around 58-62. Nonetheless it does sound as if you may have a little bit of an irregularity possibly a field although it was somewhat slow and difficult to gauge the exact rhythm. Otherwise his wound itself seems to be showing signs of great improvement with good epithelialization. He's tolerating the Iodoflex well. 12/14/17 on evaluation today patient continues to show signs of great improvement in my pinion. Overall I'm very pleased with the progress that is made. With that being said though he has shown improvement he tells me that for the first couple of days after we put his wrap on that he has severe pain for pretty much that entire amount of time. Nonetheless at this time I'm not seeing any evidence of infection which is good news. I think this may actually be coming from the actual dressing itself. That is the Iodoflex. 12/22/17 on evaluation today patient actually appears to be doing much better in regard  to his ulcer. He's been shown signs of improvement which is great news. Fortunately there does not appear to be any evidence of infection at this time. Overall I feel like he has shown excellent progress even since just last week. 12/29/17 on evaluation today patient actually appears to be doing excellent although he does have some of the dressing material stuck to the wound bed. Fortunately there does not appear to be any signs of infection which is great news. Overall I'm very pleased with the progression of his wound. 01/05/18 on evaluation  today patient actually appears to be doing rather well in regard to his ankle ulcer. He has been tolerating the dressing changes without complication. Overall very pleased. He seems to be very close to healing. 01/14/18 on evaluation today patient actually appears to be doing excellent in regard to his right lateral lower extremity wound. He's been tolerating the dressing changes without complication. Fortunately he appears to be completely healed today. READMISSION 07/20/2019 This is a now 74 year old man that we have had on in this clinic on multiple occasions usually with a wound on the right lateral ankle in the setting of chronic venous hypertension. Looking at the notes from 2016 he had venous reflux studies saw vascular surgery and was supposed to have a procedure but I cannot see what procedure that was. He was last here in 2019 at which time he had an ulcer in this same area. He often describes minor trauma. On this occasion he simply saw 2 blisters one of the blisters opened and healed the other resulted in this wound he has not been using his juxta lite stockings in fact according to his wife he use these for perhaps 2 or 3 months after he was last here but has not used them since. He does keep his legs elevated. He is a minimal ambulator walking with crutches secondary to left hip issues. Past medical history is reviewed he has hypertension, obstructive sleep apnea on CPAP and O2, stasis dermatitis, hip history of a hip surgery, venous insufficiency with lymphedema. Urinary retention requiring a suprapubic catheter, panniculitis, status post Roux-en-Y gastric bypass ABIs in our clinic were 1.22 on the right and 1.17 on the left 6/30; right lateral ankle wound which is a recurrent problem in the setting of chronic venous hypertension. He had a procedure in 2016 I will need to look this up. We have been using Iodoflex under the wound under compression. No evidence of arterial  insufficiency 08/02/2019 on evaluation today patient's wound actually showed signs of significant improvement which is great news. The Iodoflex does seem to be doing a great job keeping the necrotic tissue under control and in fact I was able to mechanically clean this away today without the use of sharp debridement which the patient tolerated in an excellent fashion. There is no evidence of infection currently. 7/14; the patient is returned from [place] on vacation. His wound actually looks somewhat better. He has been using Iodoflex under compression 7/21; wound looks about the same same measurements requires debridement we switch to [facility] last time. I looked in epic I cannot see that he actually had a venous procedure in 2016 although it is clearly listed in her notes and I heal at that time. It would seem that he did have some type of procedure but I cannot find out what it is. 08/23/2019 on evaluation today patient appears to be doing much better at this point. He has been tolerating the dressing changes  without complication and overall I am extremely pleased with how things are progressing. There is no signs of active infection at this time. 08/29/2019 on evaluation today patient appears to be doing excellent in regard to his wound. He has been tolerating the dressing changes without complication. Fortunately there is no signs of active infection at this time. No fevers, chills, nausea, vomiting, or diarrhea. 8/11-Patient returns to clinic, been using [facility] with compression wraps, the wound itself on the right lateral ankle has been improving Electronic Signature(s) Signed: 09/07/2019 3:12:53 PM By: Cassandria Anger MD, MBA Entered By: Cassandria Anger on 09/07/2019 15:12:52 Copeman, Jorje Guild ([id number]) -------------------------------------------------------------------------------- Physical Exam Details Patient Name: [name] Date of Service: 09/07/2019 2:45 PM Medical  Record Number: [id number] Patient Account Number: [id number] Date of Birth/Sex: 26-Oct-1945 (74 y.o. M) Treating RN: Huel Coventry Primary Care Provider: Etheleen Nicks Other Clinician: Referring Provider: Etheleen Nicks Treating Provider/Extender: Maryla Morrow in Treatment: 7 Constitutional alert and oriented x 3. sitting or standing blood pressure is within target range for patient.. supine blood pressure is within target range for patient.. pulse regular and within target range for patient.Marland Kitchen respirations regular, non-labored and within target range for patient.Marland Kitchen temperature within target range for patient.. . . Well-nourished and well-hydrated in no acute distress. Notes Right lateral ankle small open area with some scab that was removed with saline soaked gauze and mechanical debridement this is mostly a little bit of Hydrofera Blue that is stuck to the corner of the wound Electronic Signature(s) Signed: 09/07/2019 3:13:36 PM By: Cassandria Anger MD, MBA Entered By: Cassandria Anger on 09/07/2019 15:13:34 Cobarrubias, Jorje Guild ([id number]) -------------------------------------------------------------------------------- Physician Orders Details Patient Name: [name] Alma. Date of Service: 09/07/2019 2:45 PM Medical Record Number: [id number] Patient Account Number: [id number] Date of Birth/Sex: [birth year] (74 y.o. M) Treating RN: Huel Coventry Primary Care Provider: Etheleen Nicks Other Clinician: Referring Provider: Etheleen Nicks Treating Provider/Extender: Maryla Morrow in Treatment: 7 Verbal / Phone Orders: No Diagnosis Coding Wound Cleansing Wound #7 Right,Lateral Ankle o Cleanse wound with mild soap and water o May Shower, gently pat wound dry prior to applying new dressing. Anesthetic (add to Medication List) Wound #7 Right,Lateral Ankle o Topical Lidocaine 4% cream applied to wound bed prior to debridement (In Clinic Only). Skin Barriers/Peri-Wound Care Wound  #7 Right,Lateral Ankle o Barrier cream Primary Wound Dressing o Other: - ABD Dressing Change Frequency Wound #7 Right,Lateral Ankle o Change dressing every week Follow-up Appointments Wound #7 Right,Lateral Ankle o Return Appointment in 1 week. o Nurse Visit as needed Edema Control Wound #7 Right,Lateral Ankle o 3 Layer Compression System - Right Lower Extremity - unna paste to anchor o Patient to wear own Velcro compression garment. - left o Elevate legs to the level of the heart and pump ankles as often as possible Additional Orders / Instructions Wound #7 Right,Lateral Ankle o Increase protein intake. o Activity as tolerated Electronic Signature(s) Signed: 09/07/2019 4:03:38 PM By: Cassandria Anger MD, MBA Signed: 09/07/2019 8:15:10 PM By: Elliot Gurney, BSN, RN, CWS, Kim RN, BSN Entered By: Elliot Gurney, BSN, RN, CWS, Kim on 09/07/2019 15:11:54 Palacios, Jorje Guild ([id number]) -------------------------------------------------------------------------------- Progress Note Details Patient Name: QUENTEZ, LOBER. Date of Service: 09/07/2019 2:45 PM Medical Record Number: [id number] Patient Account Number: [id number] Date of Birth/Sex: 1945/06/10 (74 y.o. M) Treating RN: Huel Coventry Primary Care Provider: Etheleen Nicks Other Clinician: Referring Provider: Etheleen Nicks Treating Provider/Extender: Maryla Morrow in Treatment: 7 Subjective History of Present Illness (HPI) As noted above the  patient had a injury due to working in the yard and this rapidly progressed to a ulcerated area with an infection. In October 2014 he did have some vascular studies done but never saw a Careers adviser and never had any surgery for varicose veins. His past medical history is significant for hypertension, obstructive sleep apnea, morbid obesity, stasis dermatitis of both lower extremities and his past surgical history is suggestive of hip surgery on the right, gastric bypass surgery,  cholecystectomy, hip replacement. He has recently been put on Levaquin 500 milligrams daily, Bactrim DS 1 twice a day and Bactroban 2% topical ointment locally. No recent labs or vascular or imaging studies done. 07/11/2014 a venous duplex study was done on 07/05/2014 -- it showed no evidence of deep vein thrombosis or superficial thrombosis bilaterally. 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 The patient has an appointment to see the vascular surgeons on June 20. 07/18/2014 his appointment with the vascular surgeons is rescheduled for this afternoon. 07/25/2014 -- he saw the vascular surgeon and is going to have a endovenous procedure done on July 29. 08/01/2014 -- he was diagnosed with a UTI and is on ciprofloxacin for 10 days. readmission: 11/27/15 On evaluation today patient is having discomfort in the right ankle region following an open wound from having dropped a cardboard box striking his ankle. He tells me that this has not healed despite many of the measures that he learned from previous injuries when he was seen at our wound center. Therefore he didn't come in for reevaluation today. He has not noted any significant purulent discharge at this point in time. 12/04/15 patient appears to be doing somewhat better at this point in time today in regard to his ankle wound. it is measuring smaller and looking cleaner. 12/11/15; patient wound continues to look improve this is on the right lateral ankle. Debrided of surface slough and nonviable subcutaneous tissue. Using silver alginate 12-18-15 Mr. Crownover presents today with his wife for follow-up on right lower extremity venous ulcer. He denies any changes or complications over the past week. He is tolerating compression therapy to his right lower extremity and has a properly fitting compression hose to the left lower extremity. 12/25/15; patient with a wound on his  right lateral lower extremity in the setting of severe venous insufficiency [originally trauma against the box]. His edema is well controlled he is using Prisma. 01/01/16; continued improvement using Prisma. He has severe venous insufficiency however the wound was originally trauma against a box. 01/08/16; his wound is totally epithelialized. We will give him measurements for graded pressure stockings which I think he is going to get at elastic therapy in [facility] Readmission: 10/30/17 patient presents today for reevaluation in our clinic although it has been since 2017 that we've last seen him. He has a ulcer on the right lateral ankle which he tells me began roughly 2 months ago. He states that he had one similar on the left ankle although this completely healed without any complication. However the one on the right as continue to give him a lot of trouble and is not healing appropriately. He really was not sure what the best thing to put on the area would be and therefore he's just been putting advantage to cover it. Another treatment has been utilized at this time. Fortunately there is not appear to be any evidence of significant infection necessarily although there is some evidence of abnormal  discoloration in regard to the base of the wound that does not look as healthy as I was on the lives life. He does have erythema surrounding but I'm not sure if this is just due to chronic venous stasis/lymphedema or if it truly is anything infection wise going on at this point. Nonetheless in general the patient seems to be doing fairly well he's not having too much pain although he does have some discomfort. No fevers, chills, nausea, or vomiting noted at this time. 11/13/17 on evaluation today patient actually appears to be doing much better in regard to his wound currently. There does seem to be evidence of good improvement there still a lot of maceration and some necrotic tissue on the  surface of the wound but this was cleaned away very well today. Overall I'm very pleased with how things seem to be progressing in general. We did get the results of his culture back which showed that he did have evidence of Staphylococcus aureus as well as group B strep. It does appear that the Bactrim was sensitive for both. The wound again does look better. 11/20/17 on evaluation today patient's right lateral ankle ulcer actually appears to be doing excellent at this time. He has been tolerating the dressing changes without complication. With that being said overall I do feel like that he has made great progress in the few weeks we've been seeing him at this point. No fevers, chills, nausea, or vomiting noted at this time. 11/30/17 on evaluation today patient appears to be doing extremely well at this point in regard to his right lateral ankle ulcer. He has been tolerating the dressing changes without complication. In general I've been very pleased with the overall progress. His wound overview chart shows that he is making steady progress towards closure. 12/07/17 on evaluation today patient actually appears to be doing well in regard to his ulcer. He does have somewhat of an irregular heart rate noted at this point this was confirmed by manual checks as well. However it was not as low as the initial reading by the machine which was around 48 he was closer to around 58-62. Nonetheless it does sound as if you may have a little bit of an irregularity possibly a field although it was somewhat slow and difficult to gauge the exact rhythm. Otherwise his wound itself seems to be showing signs of great improvement with good epithelialization. He's tolerating the Iodoflex well. POSEY, PETRIK ([id number]) 12/14/17 on evaluation today patient continues to show signs of great improvement in my pinion. Overall I'm very pleased with the progress that is made. With that being said though he has shown  improvement he tells me that for the first couple of days after we put his wrap on that he has severe pain for pretty much that entire amount of time. Nonetheless at this time I'm not seeing any evidence of infection which is good news. I think this may actually be coming from the actual dressing itself. That is the Iodoflex. 12/22/17 on evaluation today patient actually appears to be doing much better in regard to his ulcer. He's been shown signs of improvement which is great news. Fortunately there does not appear to be any evidence of infection at this time. Overall I feel like he has shown excellent progress even since just last week. 12/29/17 on evaluation today patient actually appears to be doing excellent although he does have some of the dressing material stuck to the wound bed. Fortunately there  does not appear to be any signs of infection which is great news. Overall I'm very pleased with the progression of his wound. 01/05/18 on evaluation today patient actually appears to be doing rather well in regard to his ankle ulcer. He has been tolerating the dressing changes without complication. Overall very pleased. He seems to be very close to healing. 01/14/18 on evaluation today patient actually appears to be doing excellent in regard to his right lateral lower extremity wound. He's been tolerating the dressing changes without complication. Fortunately he appears to be completely healed today. READMISSION 07/20/2019 This is a now 74 year old man that we have had on in this clinic on multiple occasions usually with a wound on the right lateral ankle in the setting of chronic venous hypertension. Looking at the notes from 2016 he had venous reflux studies saw vascular surgery and was supposed to have a procedure but I cannot see what procedure that was. He was last here in 2019 at which time he had an ulcer in this same area. He often describes minor trauma. On this occasion he simply saw 2  blisters one of the blisters opened and healed the other resulted in this wound he has not been using his juxta lite stockings in fact according to his wife he use these for perhaps 2 or 3 months after he was last here but has not used them since. He does keep his legs elevated. He is a minimal ambulator walking with crutches secondary to left hip issues. Past medical history is reviewed he has hypertension, obstructive sleep apnea on CPAP and O2, stasis dermatitis, hip history of a hip surgery, venous insufficiency with lymphedema. Urinary retention requiring a suprapubic catheter, panniculitis, status post Roux-en-Y gastric bypass ABIs in our clinic were 1.22 on the right and 1.17 on the left 6/30; right lateral ankle wound which is a recurrent problem in the setting of chronic venous hypertension. He had a procedure in 2016 I will need to look this up. We have been using Iodoflex under the wound under compression. No evidence of arterial insufficiency 08/02/2019 on evaluation today patient's wound actually showed signs of significant improvement which is great news. The Iodoflex does seem to be doing a great job keeping the necrotic tissue under control and in fact I was able to mechanically clean this away today without the use of sharp debridement which the patient tolerated in an excellent fashion. There is no evidence of infection currently. 7/14; the patient is returned from [place] on vacation. His wound actually looks somewhat better. He has been using Iodoflex under compression 7/21; wound looks about the same same measurements requires debridement we switch to [facility] last time. I looked in epic I cannot see that he actually had a venous procedure in 2016 although it is clearly listed in her notes and I heal at that time. It would seem that he did have some type of procedure but I cannot find out what it is. 08/23/2019 on evaluation today patient appears to be doing much better at this  point. He has been tolerating the dressing changes without complication and overall I am extremely pleased with how things are progressing. There is no signs of active infection at this time. 08/29/2019 on evaluation today patient appears to be doing excellent in regard to his wound. He has been tolerating the dressing changes without complication. Fortunately there is no signs of active infection at this time. No fevers, chills, nausea, vomiting, or diarrhea. 8/11-Patient returns to  clinic, been using Hydrofera Blue with compression wraps, the wound itself on the right lateral ankle has been improving Objective Constitutional alert and oriented x 3. sitting or standing blood pressure is within target range for patient.. supine blood pressure is within target range for patient.. pulse regular and within target range for patient.Marland Kitchen respirations regular, non-labored and within target range for patient.Marland Kitchen temperature within target range for patient.. Well-nourished and well-hydrated in no acute distress. Vitals Time Taken: 2:44 PM, Height: 69 in, Weight: 320 lbs, BMI: 47.3, Temperature: 98.6 F, Pulse: 62 bpm, Respiratory Rate: 18 breaths/min, Blood Pressure: 143/70 mmHg. General Notes: Right lateral ankle small open area with some scab that was removed with saline soaked gauze and mechanical debridement this is mostly a little bit of Hydrofera Blue that is stuck to the corner of the wound Integumentary (Hair, Skin) Wound #7 status is Open. Original cause of wound was Blister. The wound is located on the Right,Lateral Ankle. The wound measures 1cm length Marcussen, Cristopher R. ([id number]) x 0.8cm width x 0.1cm depth; 0.628cm^2 area and 0.063cm^3 volume. There is Fat Layer (Subcutaneous Tissue) Exposed exposed. There is no tunneling or undermining noted. There is a medium amount of serous drainage noted. The wound margin is flat and intact. There is small (1-33%) pink granulation within the wound bed. There is  a small (1-33%) amount of necrotic tissue within the wound bed including Eschar. Procedures Wound #7 Pre-procedure diagnosis of Wound #7 is a Venous Leg Ulcer located on the Right,Lateral Ankle . There was a Three Layer Compression Therapy Procedure with a pre-treatment ABI of 1.2 by Huel Coventry, RN. Post procedure Diagnosis Wound #7: Same as Pre-Procedure Plan Wound Cleansing: Wound #7 Right,Lateral Ankle: Cleanse wound with mild soap and water May Shower, gently pat wound dry prior to applying new dressing. Anesthetic (add to Medication List): Wound #7 Right,Lateral Ankle: Topical Lidocaine 4% cream applied to wound bed prior to debridement (In Clinic Only). Skin Barriers/Peri-Wound Care: Wound #7 Right,Lateral Ankle: Barrier cream Primary Wound Dressing: Other: - ABD Dressing Change Frequency: Wound #7 Right,Lateral Ankle: Change dressing every week Follow-up Appointments: Wound #7 Right,Lateral Ankle: Return Appointment in 1 week. Nurse Visit as needed Edema Control: Wound #7 Right,Lateral Ankle: 3 Layer Compression System - Right Lower Extremity - unna paste to anchor Patient to wear own Velcro compression garment. - left Elevate legs to the level of the heart and pump ankles as often as possible Additional Orders / Instructions: Wound #7 Right,Lateral Ankle: Increase protein intake. Activity as tolerated -Can switch out to barrier cream and compression wrap this time without Hydrofera Blue -The tiny open area is likely to close up by next week -Return to clinic next week at which time he can go into his compression device Electronic Signature(s) Signed: 09/07/2019 3:14:19 PM By: Cassandria Anger MD, MBA Entered By: Cassandria Anger on 09/07/2019 15:14:19 Witherow, Jorje Guild ([id number]) -------------------------------------------------------------------------------- SuperBill Details Patient Name: [name] Alma. Date of Service: 09/07/2019 Medical Record Number:  [id number] Patient Account Number: [id number] Date of Birth/Sex: [birth year] (74 y.o. M) Treating RN: Huel Coventry Primary Care Provider: Etheleen Nicks Other Clinician: Referring Provider: Etheleen Nicks Treating Provider/Extender: Maryla Morrow in Treatment: 7 Diagnosis Coding ICD-10 Codes Code Description [phone] Chronic venous hypertension (idiopathic) with ulcer and inflammation of right lower extremity L97.312 Non-pressure chronic ulcer of right ankle with fat layer exposed Facility Procedures CPT4 Code: [id number] Description: (Facility Use Only) 84132GM - APPLY MULTLAY COMPRS LWR RT LEG Modifier: Quantity: 1 Physician Procedures CPT4 Code:  4098119 Description: 99213 - WC PHYS LEVEL 3 - EST PT Modifier: Quantity: 1 CPT4 Code: Description: ICD-10 Diagnosis Description L97.312 Non-pressure chronic ulcer of right ankle with fat layer exposed Modifier: Quantity: Electronic Signature(s) Signed: 09/07/2019 3:14:34 PM By: Cassandria Anger MD, MBA Entered By: Cassandria Anger on 09/07/2019 15:14:33

## 2019-09-08 NOTE — Progress Notes (Signed)
[name], [name] ([id number]) Visit Report for 09/07/2019 Arrival Information Details Patient Name: [name], [name]. Date of Service: 09/07/2019 2:45 PM Medical Record Number: [id number] Patient Account Number: [id number] Date of Birth/Sex: [birth year] (74 y.o. M) Treating RN: Tyler Aas Primary Care Michalene Debruler: Etheleen Nicks Other Clinician: Referring Adrianne Shackleton: Etheleen Nicks Treating Loyalty Brashier/Extender: Maryla Morrow in Treatment: 7 Visit Information History Since Last Visit Added or deleted any medications: No Patient Arrived: Crutches Had a fall or experienced change in No Arrival Time: 14:43 activities of daily living that may affect Accompanied By: wife risk of falls: Transfer Assistance: None Hospitalized since last visit: No Patient Requires Transmission-Based Precautions: No Pain Present Now: No Electronic Signature(s) Signed: 09/07/2019 4:18:47 PM By: Tyler Aas Entered By: Tyler Aas on 09/07/2019 14:44:20 [name], [name] ([id number]) -------------------------------------------------------------------------------- Compression Therapy Details Patient Name: [name]. Date of Service: 09/07/2019 2:45 PM Medical Record Number: [id number] Patient Account Number: [id number] Date of Birth/Sex: May 28, 1945 (74 y.o. M) Treating RN: Huel Coventry Primary Care Graysin Luczynski: Etheleen Nicks Other Clinician: Referring Elenora Hawbaker: Etheleen Nicks Treating Devina Bezold/Extender: Maryla Morrow in Treatment: 7 Compression Therapy Performed for Wound Assessment: Wound #7 Right,Lateral Ankle Performed By: Clinician Huel Coventry, RN Compression Type: Three Layer Pre Treatment ABI: 1.2 Post Procedure Diagnosis Same as Pre-procedure Electronic Signature(s) Signed: 09/07/2019 8:15:10 PM By: Elliot Gurney, BSN, RN, CWS, Kim RN, BSN Entered By: Elliot Gurney, BSN, RN, CWS, Kim on 09/07/2019 15:12:37 [name]  ([id number]) -------------------------------------------------------------------------------- Encounter Discharge Information Details Patient Name: [name]. Date of Service: 09/07/2019 2:45 PM Medical Record Number: [id number] Patient Account Number: [id number] Date of Birth/Sex: 03-11-1945 (74 y.o. M) Treating RN: Huel Coventry Primary Care Tam Delisle: Etheleen Nicks Other Clinician: Referring Tarry Fountain: Etheleen Nicks Treating Reyn Faivre/Extender: Maryla Morrow in Treatment: 7 Encounter Discharge Information Items Discharge Condition: Stable Ambulatory Status: Ambulatory Discharge Destination: Home Transportation: Private Auto Accompanied By: self Schedule Follow-up Appointment: Yes Clinical Summary of Care: Electronic Signature(s) Signed: 09/07/2019 8:15:10 PM By: Elliot Gurney, BSN, RN, CWS, Kim RN, BSN Entered By: Elliot Gurney, BSN, RN, CWS, Kim on 09/07/2019 15:14:12 [name] ([id number]) -------------------------------------------------------------------------------- Lower Extremity Assessment Details Patient Name: [name]. Date of Service: 09/07/2019 2:45 PM Medical Record Number: [id number] Patient Account Number: [id number] Date of Birth/Sex: 06/15/1945 (74 y.o. M) Treating RN: Tyler Aas Primary Care Dustin Burrill: Etheleen Nicks Other Clinician: Referring Yolander Goodie: Etheleen Nicks Treating Scotlynn Noyes/Extender: Maryla Morrow in Treatment: 7 Edema Assessment Assessed: [Left: No] [Right: Yes] Edema: [Left: N] [Right: o] Calf Left: Right: Point of Measurement: cm From Medial Instep cm 39.5 cm Ankle Left: Right: Point of Measurement: cm From Medial Instep cm 23 cm Vascular Assessment Pulses: Dorsalis Pedis Palpable: [Right:Yes] Posterior Tibial Palpable: [Right:Yes] Electronic Signature(s) Signed: 09/07/2019 4:18:47 PM By: Tyler Aas Entered By: Tyler Aas on 09/07/2019 15:04:30 [name], [name]  ([id number]) -------------------------------------------------------------------------------- Multi Wound Chart Details Patient Name: [name]. Date of Service: 09/07/2019 2:45 PM Medical Record Number: [id number] Patient Account Number: [id number] Date of Birth/Sex: [birth year] (74 y.o. M) Treating RN: Huel Coventry Primary Care Glen Kesinger: Etheleen Nicks Other Clinician: Referring Aurielle Slingerland: Etheleen Nicks Treating Jadaya Sommerfield/Extender: Maryla Morrow in Treatment: 7 Vital Signs Height(in): 69 Pulse(bpm): 62 Weight(lbs): 320 Blood Pressure(mmHg): 143/70 Body Mass Index(BMI): 47 Temperature(F): 98.6 Respiratory Rate(breaths/min): 18 Photos: [N/A:N/A] Wound Location: Right, Lateral Ankle N/A N/A Wounding Event: Blister N/A N/A Primary Etiology: Venous Leg Ulcer N/A N/A Comorbid History: Anemia, Sleep Apnea, N/A N/A Hypertension, Osteoarthritis Date Acquired: 06/20/2019 N/A N/A Weeks of Treatment: 7 N/A N/A Wound Status: Open N/A  N/A Measurements L x W x D (cm) 1x0.8x0.1 N/A N/A Area (cm) : 0.628 N/A N/A Volume (cm) : 0.063 N/A N/A % Reduction in Area: 88.20% N/A N/A % Reduction in Volume: 94.10% N/A N/A Classification: Full Thickness Without Exposed N/A N/A Support Structures Exudate Amount: Medium N/A N/A Exudate Type: Serous N/A N/A Exudate Color: amber N/A N/A Wound Margin: Flat and Intact N/A N/A Granulation Amount: Small (1-33%) N/A N/A Granulation Quality: Pink N/A N/A Necrotic Amount: Small (1-33%) N/A N/A Necrotic Tissue: Eschar N/A N/A Exposed Structures: Fat Layer (Subcutaneous Tissue) N/A N/A Exposed: Yes Fascia: No Tendon: No Muscle: No Joint: No Bone: No Epithelialization: Large (67-100%) N/A N/A Treatment Notes Electronic Signature(s) Signed: 09/07/2019 8:15:10 PM By: Elliot Gurney, BSN, RN, CWS, Kim RN, BSN Entered By: Elliot Gurney, BSN, RN, CWS, Kim on 09/07/2019 15:10:25 [name]  ([id number]) -------------------------------------------------------------------------------- Multi-Disciplinary Care Plan Details Patient Name: [name]. Date of Service: 09/07/2019 2:45 PM Medical Record Number: [id number] Patient Account Number: [id number] Date of Birth/Sex: February 12, 1945 (74 y.o. M) Treating RN: Huel Coventry Primary Care Briley Sulton: Etheleen Nicks Other Clinician: Referring Kathy Wares: Etheleen Nicks Treating Delecia Vastine/Extender: Maryla Morrow in Treatment: 7 Active Inactive Necrotic Tissue Nursing Diagnoses: Impaired tissue integrity related to necrotic/devitalized tissue Goals: Necrotic/devitalized tissue will be minimized in the wound bed Date Initiated: 07/20/2019 Target Resolution Date: 08/19/2019 Goal Status: Active Interventions: Assess patient pain level pre-, during and post procedure and prior to discharge Treatment Activities: Apply topical anesthetic as ordered : 07/20/2019 Notes: Orientation to the Wound Care Program Nursing Diagnoses: Knowledge deficit related to the wound healing center program Goals: Patient/caregiver will verbalize understanding of the Wound Healing Center Program Date Initiated: 07/20/2019 Target Resolution Date: 08/19/2019 Goal Status: Active Interventions: Provide education on orientation to the wound center Notes: Pain, Acute or Chronic Nursing Diagnoses: Pain Management - Non-cyclic Acute (Procedural) Goals: Patient will verbalize adequate pain control and receive pain control interventions during procedures as needed Date Initiated: 07/20/2019 Target Resolution Date: 08/19/2019 Goal Status: Active Interventions: Assess comfort goal upon admission Notes: Venous Leg Ulcer Nursing Diagnoses: Knowledge deficit related to disease process and management Goals: Patient will maintain optimal edema control AADI, BORDNER ([id number]) Date Initiated: 07/20/2019 Target Resolution Date: 08/19/2019 Goal Status:  Active Interventions: Assess peripheral edema status every visit. Compression as ordered Treatment Activities: Therapeutic compression applied : 07/20/2019 Notes: Wound/Skin Impairment Nursing Diagnoses: Knowledge deficit related to ulceration/compromised skin integrity Goals: Ulcer/skin breakdown will have a volume reduction of 30% by week 4 Date Initiated: 07/20/2019 Target Resolution Date: 08/19/2019 Goal Status: Active Interventions: Assess patient/caregiver ability to obtain necessary supplies Provide education on ulcer and skin care Treatment Activities: Skin care regimen initiated : 07/20/2019 Notes: Electronic Signature(s) Signed: 09/07/2019 8:15:10 PM By: Elliot Gurney, BSN, RN, CWS, Kim RN, BSN Entered By: Elliot Gurney, BSN, RN, CWS, Kim on 09/07/2019 15:10:15 Conteh, [name] ([id number]) -------------------------------------------------------------------------------- Pain Assessment Details Patient Name: [name] Pen R. Date of Service: 09/07/2019 2:45 PM Medical Record Number: [id number] Patient Account Number: [id number] Date of Birth/Sex: 02/25/1945 (74 y.o. M) Treating RN: Tyler Aas Primary Care Rylan Bernard: Etheleen Nicks Other Clinician: Referring Khloie Hamada: Etheleen Nicks Treating Keshav Winegar/Extender: Maryla Morrow in Treatment: 7 Active Problems Location of Pain Severity and Description of Pain Patient Has Paino No Site Locations Pain Management and Medication Current Pain Management: Electronic Signature(s) Signed: 09/07/2019 4:18:47 PM By: Tyler Aas Entered By: Tyler Aas on 09/07/2019 14:48:51 [place], [name] ([id number]) -------------------------------------------------------------------------------- Patient/Caregiver Education Details Patient Name: [name]. Date of Service: 09/07/2019 2:45 PM  Medical Record Number: [id number] Patient Account Number: [id number] Date of Birth/Gender: [birth year] (74 y.o. M) Treating RN: Huel Coventry Primary  Care Physician: Etheleen Nicks Other Clinician: Referring Physician: Etheleen Nicks Treating Physician/Extender: Maryla Morrow in Treatment: 7 Education Assessment Education Provided To: Patient Education Topics Provided Venous: Handouts: Controlling Swelling with Multilayered Compression Wraps Methods: Demonstration, Explain/Verbal Responses: State content correctly Electronic Signature(s) Signed: 09/07/2019 8:15:10 PM By: Elliot Gurney, BSN, RN, CWS, Kim RN, BSN Entered By: Elliot Gurney, BSN, RN, CWS, Kim on 09/07/2019 15:13:23 Sisney, [name] ([id number]) -------------------------------------------------------------------------------- Wound Assessment Details Patient Name: [name]. Date of Service: 09/07/2019 2:45 PM Medical Record Number: [id number] Patient Account Number: [id number] Date of Birth/Sex: 12-23-1945 (74 y.o. M) Treating RN: Tyler Aas Primary Care Alicia Seib: Etheleen Nicks Other Clinician: Referring [place] Every: Etheleen Nicks Treating Albaro Deviney/Extender: Maryla Morrow in Treatment: 7 Wound Status Wound Number: 7 Primary Etiology: Venous Leg Ulcer Wound Location: Right, Lateral Ankle Wound Status: Open Wounding Event: Blister Comorbid History: Anemia, Sleep Apnea, Hypertension, Osteoarthritis Date Acquired: 06/20/2019 Weeks Of Treatment: 7 Clustered Wound: No Photos Wound Measurements Length: (cm) 1 Width: (cm) 0.8 Depth: (cm) 0.1 Area: (cm) 0.628 Volume: (cm) 0.063 % Reduction in Area: 88.2% % Reduction in Volume: 94.1% Epithelialization: Large (67-100%) Tunneling: No Undermining: No Wound Description Classification: Full Thickness Without Exposed Support Structu Wound Margin: Flat and Intact Exudate Amount: Medium Exudate Type: Serous Exudate Color: amber res Foul Odor After Cleansing: No Slough/Fibrino Yes Wound Bed Granulation Amount: Small (1-33%) Exposed Structure Granulation Quality: Pink Fascia Exposed: No Necrotic  Amount: Small (1-33%) Fat Layer (Subcutaneous Tissue) Exposed: Yes Necrotic Quality: Eschar Tendon Exposed: No Muscle Exposed: No Joint Exposed: No Bone Exposed: No Treatment Notes Wound #7 (Right, Lateral Ankle) Notes abd, 3 layer right, unna to anchor Electronic Signature(s) Signed: 09/07/2019 4:18:47 PM By: Mancel Parsons, Bradford R. ([id number]) Entered By: Tyler Aas on 09/07/2019 15:02:45 Crowson, [name] ([id number]) -------------------------------------------------------------------------------- Vitals Details Patient Name: [name]. Date of Service: 09/07/2019 2:45 PM Medical Record Number: [id number] Patient Account Number: [id number] Date of Birth/Sex: [birth year] (74 y.o. M) Treating RN: Tyler Aas Primary Care Kindle Strohmeier: Etheleen Nicks Other Clinician: Referring Lexys Milliner: Etheleen Nicks Treating Montina Dorrance/Extender: Maryla Morrow in Treatment: 7 Vital Signs Time Taken: 14:44 Temperature (F): 98.6 Height (in): 69 Pulse (bpm): 62 Weight (lbs): 320 Respiratory Rate (breaths/min): 18 Body Mass Index (BMI): 47.3 Blood Pressure (mmHg): 143/70 Reference Range: 80 - 120 mg / dl Electronic Signature(s) Signed: 09/07/2019 4:18:47 PM By: Tyler Aas Entered By: Tyler Aas on 09/07/2019 14:48:43

## 2019-09-15 ENCOUNTER — Encounter: Payer: Medicare Other | Admitting: Physician Assistant

## 2019-09-15 ENCOUNTER — Other Ambulatory Visit: Payer: Self-pay

## 2019-09-15 DIAGNOSIS — I87331 Chronic venous hypertension (idiopathic) with ulcer and inflammation of right lower extremity: Secondary | ICD-10-CM | POA: Diagnosis not present

## 2019-09-15 NOTE — Progress Notes (Addendum)
[name], [name] ([id number]) Visit Report for 09/15/2019 Chief Complaint Document Details Patient Name: [name], [name] Date of Service: 09/15/2019 12:30 PM Medical Record Number: [id number] Patient Account Number: [id number] Date of Birth/Sex: 06/30/1945 (74 y.o. M) Treating RN: Primary Care Provider: Etheleen NicksMACDONALD, KERI Other Clinician: Referring Provider: Etheleen NicksMACDONALD, KERI Treating Provider/Extender: Linwood DibblesSTONE III, Keilani Terrance Weeks in Treatment: 8 Information Obtained from: Patient Chief Complaint Right lateral ankle ulcer 07/20/2019; patient is here for a review of the wound on the right lateral ankle Electronic Signature(s) Signed: 09/15/2019 12:54:56 PM By: Lenda KelpStone III, Liliya Fullenwider PA-C Entered By: Lenda KelpStone III, Lis Savitt on 09/15/2019 12:54:56 Bena, [name] ([id number]) -------------------------------------------------------------------------------- HPI Details Patient Name: [name], [name] Date of Service: 09/15/2019 12:30 PM Medical Record Number: [id number] Patient Account Number: [id number] Date of Birth/Sex: 11/12/1945 (74 y.o. M) Treating RN: Primary Care Provider: Etheleen NicksMACDONALD, KERI Other Clinician: Referring Provider: Etheleen NicksMACDONALD, KERI Treating Provider/Extender: Linwood DibblesSTONE III, Jaelani Posa Weeks in Treatment: 8 History of Present Illness HPI Description: As noted above the patient had a injury due to working in the yard and this rapidly progressed to a ulcerated area with an infection. In October 2014 he did have some vascular studies done but never saw a Careers advisersurgeon and never had any surgery for varicose veins. His past medical history is significant for hypertension, obstructive sleep apnea, morbid obesity, stasis dermatitis of both lower extremities and his past surgical history is suggestive of hip surgery on the right, gastric bypass surgery, cholecystectomy, hip replacement. He has recently been put on Levaquin 500 milligrams daily, Bactrim DS 1 twice a day and Bactroban 2% topical ointment locally. No recent labs  or vascular or imaging studies done. 07/11/2014 a venous duplex study was done on 07/05/2014 -- it showed no evidence of deep vein thrombosis or superficial thrombosis bilaterally. 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 The patient has an appointment to see the vascular surgeons on June 20. 07/18/2014 his appointment with the vascular surgeons is rescheduled for this afternoon. 07/25/2014 -- he saw the vascular surgeon and is going to have a endovenous procedure done on July 29. 08/01/2014 -- he was diagnosed with a UTI and is on ciprofloxacin for 10 days. readmission: 11/27/15 On evaluation today patient is having discomfort in the right ankle region following an open wound from having dropped a cardboard box striking his ankle. He tells me that this has not healed despite many of the measures that he learned from previous injuries when he was seen at our wound center. Therefore he didn't come in for reevaluation today. He has not noted any significant purulent discharge at this point in time. 12/04/15 patient appears to be doing somewhat better at this point in time today in regard to his ankle wound. it is measuring smaller and looking cleaner. 12/11/15; patient wound continues to look improve this is on the right lateral ankle. Debrided of surface slough and nonviable subcutaneous tissue. Using silver alginate 12-18-15 Mr. Ballantine presents today with his wife for follow-up on right lower extremity venous ulcer. He denies any changes or complications over the past week. He is tolerating compression therapy to his right lower extremity and has a properly fitting compression hose to the left lower extremity. 12/25/15; patient with a wound on his right lateral lower extremity in the setting of severe venous insufficiency [originally trauma against the box]. His edema is well controlled he is using Prisma. 01/01/16;  continued improvement using Prisma. He has severe venous insufficiency however  the wound was originally trauma against a box. 01/08/16; his wound is totally epithelialized. We will give him measurements for graded pressure stockings which I think he is going to get at elastic therapy in [facility] Readmission: 10/30/17 patient presents today for reevaluation in our clinic although it has been since 2017 that we've last seen him. He has a ulcer on the right lateral ankle which he tells me began roughly 2 months ago. He states that he had one similar on the left ankle although this completely healed without any complication. However the one on the right as continue to give him a lot of trouble and is not healing appropriately. He really was not sure what the best thing to put on the area would be and therefore he's just been putting advantage to cover it. Another treatment has been utilized at this time. Fortunately there is not appear to be any evidence of significant infection necessarily although there is some evidence of abnormal discoloration in regard to the base of the wound that does not look as healthy as I was on the lives life. He does have erythema surrounding but I'm not sure if this is just due to chronic venous stasis/lymphedema or if it truly is anything infection wise going on at this point. Nonetheless in general the patient seems to be doing fairly well he's not having too much pain although he does have some discomfort. No fevers, chills, nausea, or vomiting noted at this time. 11/13/17 on evaluation today patient actually appears to be doing much better in regard to his wound currently. There does seem to be evidence of good improvement there still a lot of maceration and some necrotic tissue on the surface of the wound but this was cleaned away very well today. Overall I'm very pleased with how things seem to be progressing in general. We did get the results of his  culture back which showed that he did have evidence of Staphylococcus aureus as well as group B strep. It does appear that the Bactrim was sensitive for both. The wound again does look better. 11/20/17 on evaluation today patient's right lateral ankle ulcer actually appears to be doing excellent at this time. He has been tolerating the dressing changes without complication. With that being said overall I do feel like that he has made great progress in the few weeks we've been seeing him at this point. No fevers, chills, nausea, or vomiting noted at this time. 11/30/17 on evaluation today patient appears to be doing extremely well at this point in regard to his right lateral ankle ulcer. He has been tolerating the dressing changes without complication. In general I've been very pleased with the overall progress. His wound overview chart shows that he is making steady progress towards closure. 12/07/17 on evaluation today patient actually appears to be doing well in regard to his ulcer. He does have somewhat of an irregular heart rate noted at this point this was confirmed by manual checks as well. However it was not as low as the initial reading by the machine which was around 48 he was closer to around 58-62. Nonetheless it does sound as if you may have a little bit of an irregularity possibly a field although it was somewhat slow and difficult to gauge the exact rhythm. Otherwise his wound itself seems to be showing signs of great improvement with Rossell, Elin R. ([id number]) good epithelialization. He's tolerating the Iodoflex well. 12/14/17 on evaluation today patient continues to show  signs of great improvement in my pinion. Overall I'm very pleased with the progress that is made. With that being said though he has shown improvement he tells me that for the first couple of days after we put his wrap on that he has severe pain for pretty much that entire amount of time. Nonetheless at this time  I'm not seeing any evidence of infection which is good news. I think this may actually be coming from the actual dressing itself. That is the Iodoflex. 12/22/17 on evaluation today patient actually appears to be doing much better in regard to his ulcer. He's been shown signs of improvement which is great news. Fortunately there does not appear to be any evidence of infection at this time. Overall I feel like he has shown excellent progress even since just last week. 12/29/17 on evaluation today patient actually appears to be doing excellent although he does have some of the dressing material stuck to the wound bed. Fortunately there does not appear to be any signs of infection which is great news. Overall I'm very pleased with the progression of his wound. 01/05/18 on evaluation today patient actually appears to be doing rather well in regard to his ankle ulcer. He has been tolerating the dressing changes without complication. Overall very pleased. He seems to be very close to healing. 01/14/18 on evaluation today patient actually appears to be doing excellent in regard to his right lateral lower extremity wound. He's been tolerating the dressing changes without complication. Fortunately he appears to be completely healed today. READMISSION 07/20/2019 This is a now 74 year old man that we have had on in this clinic on multiple occasions usually with a wound on the right lateral ankle in the setting of chronic venous hypertension. Looking at the notes from 2016 he had venous reflux studies saw vascular surgery and was supposed to have a procedure but I cannot see what procedure that was. He was last here in 2019 at which time he had an ulcer in this same area. He often describes minor trauma. On this occasion he simply saw 2 blisters one of the blisters opened and healed the other resulted in this wound he has not been using his juxta lite stockings in fact according to his wife he use these for  perhaps 2 or 3 months after he was last here but has not used them since. He does keep his legs elevated. He is a minimal ambulator walking with crutches secondary to left hip issues. Past medical history is reviewed he has hypertension, obstructive sleep apnea on CPAP and O2, stasis dermatitis, hip history of a hip surgery, venous insufficiency with lymphedema. Urinary retention requiring a suprapubic catheter, panniculitis, status post Roux-en-Y gastric bypass ABIs in our clinic were 1.22 on the right and 1.17 on the left 6/30; right lateral ankle wound which is a recurrent problem in the setting of chronic venous hypertension. He had a procedure in 2016 I will need to look this up. We have been using Iodoflex under the wound under compression. No evidence of arterial insufficiency 08/02/2019 on evaluation today patient's wound actually showed signs of significant improvement which is great news. The Iodoflex does seem to be doing a great job keeping the necrotic tissue under control and in fact I was able to mechanically clean this away today without the use of sharp debridement which the patient tolerated in an excellent fashion. There is no evidence of infection currently. 7/14; the patient is returned from [place] on vacation.  His wound actually looks somewhat better. He has been using Iodoflex under compression 7/21; wound looks about the same same measurements requires debridement we switch to [facility] last time. I looked in epic I cannot see that he actually had a venous procedure in 2016 although it is clearly listed in her notes and I heal at that time. It would seem that he did have some type of procedure but I cannot find out what it is. 08/23/2019 on evaluation today patient appears to be doing much better at this point. He has been tolerating the dressing changes without complication and overall I am extremely pleased with how things are progressing. There is no signs of active  infection at this time. 08/29/2019 on evaluation today patient appears to be doing excellent in regard to his wound. He has been tolerating the dressing changes without complication. Fortunately there is no signs of active infection at this time. No fevers, chills, nausea, vomiting, or diarrhea. 8/11-Patient returns to clinic, been using [facility] with compression wraps, the wound itself on the right lateral ankle has been improving 09/15/2019 on evaluation today patient appears to be doing extremely well with regard to his wound. In fact there is a lot of dry skin this could even be healed underneath. Electronic Signature(s) Signed: 09/15/2019 1:44:55 PM By: Lenda Kelp PA-C Entered By: Lenda Kelp on 09/15/2019 13:44:55 Lichtenberger, [name] ([id number]) -------------------------------------------------------------------------------- Physical Exam Details Patient Name: [name] Alma. Date of Service: 09/15/2019 12:30 PM Medical Record Number: [id number] Patient Account Number: [id number] Date of Birth/Sex: 09-01-1945 (74 y.o. M) Treating RN: Primary Care Provider: Etheleen Nicks Other Clinician: Referring Provider: Etheleen Nicks Treating Provider/Extender: Linwood Dibbles, Malyna Budney Weeks in Treatment: 8 Constitutional Chronically ill appearing but in no apparent acute distress. Respiratory normal breathing without difficulty. Psychiatric this patient is able to make decisions and demonstrates good insight into disease process. Alert and Oriented x 3. pleasant and cooperative. Notes Upon inspection patient's wound bed actually showed signs of having dry skin over the surface of the wound and I did actually debride this away carefully. With that being said there was nothing underneath and wound appears to be completely healed which is great news. Fortunately he does have compression socks and I think he should do quite well with those. Electronic Signature(s) Signed: 09/15/2019 1:45:29 PM By:  Lenda Kelp PA-C Entered By: Lenda Kelp on 09/15/2019 13:45:28 Havener, [name] ([id number]) -------------------------------------------------------------------------------- Physician Orders Details Patient Name: [name] Alma. Date of Service: 09/15/2019 12:30 PM Medical Record Number: [id number] Patient Account Number: [id number] Date of Birth/Sex: 10-31-1945 (74 y.o. M) Treating RN: Huel Coventry Primary Care Provider: Etheleen Nicks Other Clinician: Referring Provider: Etheleen Nicks Treating Provider/Extender: Linwood Dibbles, Kaelyn Nauta Weeks in Treatment: 8 Verbal / Phone Orders: No Diagnosis Coding ICD-10 Coding Code Description I87.331 Chronic venous hypertension (idiopathic) with ulcer and inflammation of right lower extremity L97.312 Non-pressure chronic ulcer of right ankle with fat layer exposed Edema Control o Patient to wear own compression stockings - Wear daily Discharge From [facility] o Discharge from [facility] - Treatment complete Electronic Signature(s) Signed: 09/15/2019 5:00:57 PM By: Lenda Kelp PA-C Signed: 09/15/2019 6:39:29 PM By: Elliot Gurney, BSN, RN, CWS, Kim RN, BSN Entered By: Elliot Gurney, BSN, RN, CWS, Kim on 09/15/2019 13:15:53 Lovecchio, [name] ([id number]) -------------------------------------------------------------------------------- Problem List Details Patient Name: [name] Date of Service: 09/15/2019 12:30 PM Medical Record Number: [id number] Patient Account Number: [id number] Date of Birth/Sex: [birth year] (74 y.o. M) Treating  RN: Primary Care Provider: Etheleen Nicks Other Clinician: Referring Provider: Etheleen Nicks Treating Provider/Extender: Linwood Dibbles, Kareem Aul Weeks in Treatment: 8 Active Problems ICD-10 Encounter Code Description Active Date MDM Diagnosis I87.331 Chronic venous hypertension (idiopathic) with ulcer and inflammation of 07/20/2019 No Yes right lower extremity L97.312 Non-pressure chronic ulcer of right ankle with  fat layer exposed 07/20/2019 No Yes Inactive Problems Resolved Problems Electronic Signature(s) Signed: 09/15/2019 12:54:34 PM By: Lenda Kelp PA-C Entered By: Lenda Kelp on 09/15/2019 12:54:33 Keysor, Merdith R. ([id number]) -------------------------------------------------------------------------------- Progress Note Details Patient Name: [name] Alma. Date of Service: 09/15/2019 12:30 PM Medical Record Number: [id number] Patient Account Number: [id number] Date of Birth/Sex: [birth year] (74 y.o. M) Treating RN: Primary Care Provider: Etheleen Nicks Other Clinician: Referring Provider: Etheleen Nicks Treating Provider/Extender: Linwood Dibbles, Breionna Punt Weeks in Treatment: 8 Subjective Chief Complaint Information obtained from Patient Right lateral ankle ulcer 07/20/2019; patient is here for a review of the wound on the right lateral ankle History of Present Illness (HPI) As noted above the patient had a injury due to working in the yard and this rapidly progressed to a ulcerated area with an infection. In October 2014 he did have some vascular studies done but never saw a Careers adviser and never had any surgery for varicose veins. His past medical history is significant for hypertension, obstructive sleep apnea, morbid obesity, stasis dermatitis of both lower extremities and his past surgical history is suggestive of hip surgery on the right, gastric bypass surgery, cholecystectomy, hip replacement. He has recently been put on Levaquin 500 milligrams daily, Bactrim DS 1 twice a day and Bactroban 2% topical ointment locally. No recent labs or vascular or imaging studies done. 07/11/2014 a venous duplex study was done on 07/05/2014 -- it showed no evidence of deep vein thrombosis or superficial thrombosis bilaterally. 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 The patient has an appointment to see the vascular surgeons on  June 20. 07/18/2014 his appointment with the vascular surgeons is rescheduled for this afternoon. 07/25/2014 -- he saw the vascular surgeon and is going to have a endovenous procedure done on July 29. 08/01/2014 -- he was diagnosed with a UTI and is on ciprofloxacin for 10 days. readmission: 11/27/15 On evaluation today patient is having discomfort in the right ankle region following an open wound from having dropped a cardboard box striking his ankle. He tells me that this has not healed despite many of the measures that he learned from previous injuries when he was seen at our wound center. Therefore he didn't come in for reevaluation today. He has not noted any significant purulent discharge at this point in time. 12/04/15 patient appears to be doing somewhat better at this point in time today in regard to his ankle wound. it is measuring smaller and looking cleaner. 12/11/15; patient wound continues to look improve this is on the right lateral ankle. Debrided of surface slough and nonviable subcutaneous tissue. Using silver alginate 12-18-15 Mr. Manfredi presents today with his wife for follow-up on right lower extremity venous ulcer. He denies any changes or complications over the past week. He is tolerating compression therapy to his right lower extremity and has a properly fitting compression hose to the left lower extremity. 12/25/15; patient with a wound on his right lateral lower extremity in the setting of severe venous insufficiency [originally trauma against the box]. His edema is well controlled he is using Prisma. 01/01/16; continued improvement using Prisma.  He has severe venous insufficiency however the wound was originally trauma against a box. 01/08/16; his wound is totally epithelialized. We will give him measurements for graded pressure stockings which I think he is going to get at elastic therapy in [facility] Readmission: 10/30/17 patient presents today for  reevaluation in our clinic although it has been since 2017 that we've last seen him. He has a ulcer on the right lateral ankle which he tells me began roughly 2 months ago. He states that he had one similar on the left ankle although this completely healed without any complication. However the one on the right as continue to give him a lot of trouble and is not healing appropriately. He really was not sure what the best thing to put on the area would be and therefore he's just been putting advantage to cover it. Another treatment has been utilized at this time. Fortunately there is not appear to be any evidence of significant infection necessarily although there is some evidence of abnormal discoloration in regard to the base of the wound that does not look as healthy as I was on the lives life. He does have erythema surrounding but I'm not sure if this is just due to chronic venous stasis/lymphedema or if it truly is anything infection wise going on at this point. Nonetheless in general the patient seems to be doing fairly well he's not having too much pain although he does have some discomfort. No fevers, chills, nausea, or vomiting noted at this time. 11/13/17 on evaluation today patient actually appears to be doing much better in regard to his wound currently. There does seem to be evidence of good improvement there still a lot of maceration and some necrotic tissue on the surface of the wound but this was cleaned away very well today. Overall I'm very pleased with how things seem to be progressing in general. We did get the results of his culture back which showed that he did have evidence of Staphylococcus aureus as well as group B strep. It does appear that the Bactrim was sensitive for both. The wound again does look better. 11/20/17 on evaluation today patient's right lateral ankle ulcer actually appears to be doing excellent at this time. He has been tolerating the dressing changes without  complication. With that being said overall I do feel like that he has made great progress in the few weeks we've been seeing him at this point. No fevers, chills, nausea, or vomiting noted at this time. 11/30/17 on evaluation today patient appears to be doing extremely well at this point in regard to his right lateral ankle ulcer. He has been tolerating the dressing changes without complication. In general I've been very pleased with the overall progress. His wound overview chart shows that he is making steady progress towards closure. EITHEN, CASTIGLIA ([id number]) 12/07/17 on evaluation today patient actually appears to be doing well in regard to his ulcer. He does have somewhat of an irregular heart rate noted at this point this was confirmed by manual checks as well. However it was not as low as the initial reading by the machine which was around 48 he was closer to around 58-62. Nonetheless it does sound as if you may have a little bit of an irregularity possibly a field although it was somewhat slow and difficult to gauge the exact rhythm. Otherwise his wound itself seems to be showing signs of great improvement with good epithelialization. He's tolerating the Iodoflex well. 12/14/17  on evaluation today patient continues to show signs of great improvement in my pinion. Overall I'm very pleased with the progress that is made. With that being said though he has shown improvement he tells me that for the first couple of days after we put his wrap on that he has severe pain for pretty much that entire amount of time. Nonetheless at this time I'm not seeing any evidence of infection which is good news. I think this may actually be coming from the actual dressing itself. That is the Iodoflex. 12/22/17 on evaluation today patient actually appears to be doing much better in regard to his ulcer. He's been shown signs of improvement which is great news. Fortunately there does not appear to be any evidence  of infection at this time. Overall I feel like he has shown excellent progress even since just last week. 12/29/17 on evaluation today patient actually appears to be doing excellent although he does have some of the dressing material stuck to the wound bed. Fortunately there does not appear to be any signs of infection which is great news. Overall I'm very pleased with the progression of his wound. 01/05/18 on evaluation today patient actually appears to be doing rather well in regard to his ankle ulcer. He has been tolerating the dressing changes without complication. Overall very pleased. He seems to be very close to healing. 01/14/18 on evaluation today patient actually appears to be doing excellent in regard to his right lateral lower extremity wound. He's been tolerating the dressing changes without complication. Fortunately he appears to be completely healed today. READMISSION 07/20/2019 This is a now 74 year old man that we have had on in this clinic on multiple occasions usually with a wound on the right lateral ankle in the setting of chronic venous hypertension. Looking at the notes from 2016 he had venous reflux studies saw vascular surgery and was supposed to have a procedure but I cannot see what procedure that was. He was last here in 2019 at which time he had an ulcer in this same area. He often describes minor trauma. On this occasion he simply saw 2 blisters one of the blisters opened and healed the other resulted in this wound he has not been using his juxta lite stockings in fact according to his wife he use these for perhaps 2 or 3 months after he was last here but has not used them since. He does keep his legs elevated. He is a minimal ambulator walking with crutches secondary to left hip issues. Past medical history is reviewed he has hypertension, obstructive sleep apnea on CPAP and O2, stasis dermatitis, hip history of a hip surgery, venous insufficiency with lymphedema.  Urinary retention requiring a suprapubic catheter, panniculitis, status post Roux-en-Y gastric bypass ABIs in our clinic were 1.22 on the right and 1.17 on the left 6/30; right lateral ankle wound which is a recurrent problem in the setting of chronic venous hypertension. He had a procedure in 2016 I will need to look this up. We have been using Iodoflex under the wound under compression. No evidence of arterial insufficiency 08/02/2019 on evaluation today patient's wound actually showed signs of significant improvement which is great news. The Iodoflex does seem to be doing a great job keeping the necrotic tissue under control and in fact I was able to mechanically clean this away today without the use of sharp debridement which the patient tolerated in an excellent fashion. There is no evidence of infection currently. 7/14; the  patient is returned from [place] on vacation. His wound actually looks somewhat better. He has been using Iodoflex under compression 7/21; wound looks about the same same measurements requires debridement we switch to [facility] last time. I looked in epic I cannot see that he actually had a venous procedure in 2016 although it is clearly listed in her notes and I heal at that time. It would seem that he did have some type of procedure but I cannot find out what it is. 08/23/2019 on evaluation today patient appears to be doing much better at this point. He has been tolerating the dressing changes without complication and overall I am extremely pleased with how things are progressing. There is no signs of active infection at this time. 08/29/2019 on evaluation today patient appears to be doing excellent in regard to his wound. He has been tolerating the dressing changes without complication. Fortunately there is no signs of active infection at this time. No fevers, chills, nausea, vomiting, or diarrhea. 8/11-Patient returns to clinic, been using [facility] with compression  wraps, the wound itself on the right lateral ankle has been improving 09/15/2019 on evaluation today patient appears to be doing extremely well with regard to his wound. In fact there is a lot of dry skin this could even be healed underneath. Objective Constitutional Chronically ill appearing but in no apparent acute distress. Vitals Time Taken: 12:40 PM, Height: 69 in, Weight: 320 lbs, BMI: 47.3, Temperature: 98.5 F, Pulse: 56 bpm, Respiratory Rate: 20 breaths/min, Blood Pressure: 124/64 mmHg. Hefty, Kayne R. ([id number]) Respiratory normal breathing without difficulty. Psychiatric this patient is able to make decisions and demonstrates good insight into disease process. Alert and Oriented x 3. pleasant and cooperative. General Notes: Upon inspection patient's wound bed actually showed signs of having dry skin over the surface of the wound and I did actually debride this away carefully. With that being said there was nothing underneath and wound appears to be completely healed which is great news. Fortunately he does have compression socks and I think he should do quite well with those. Integumentary (Hair, Skin) Wound #7 status is Healed - Epithelialized. Original cause of wound was Blister. The wound is located on the Right,Lateral Ankle. The wound measures 0cm length x 0cm width x 0cm depth; 0cm^2 area and 0cm^3 volume. There is Fat Layer (Subcutaneous Tissue) Exposed exposed. There is a medium amount of serous drainage noted. The wound margin is flat and intact. There is small (1-33%) pink granulation within the wound bed. There is a small (1-33%) amount of necrotic tissue within the wound bed including Eschar. 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 At this point I would recommend that we discontinue wound care services as the patient appears to be completely healed this is great news. 2. He will wear his own compression stockings and I explained he is to wear these throughout the day take them off at night and putting lotion on at night first in the morning he needs to put them right back on. We will see him back for follow-up visit as needed. Electronic Signature(s) Signed: 09/15/2019 1:47:36 PM By: Lenda Kelp PA-C Entered By: Lenda Kelp on 09/15/2019 13:47:36 Stepp, [place] R. ([id number]) --------------------------------------------------------------------------------  SuperBill Details Patient Name: GARFIELD, COINER. Date of Service: 09/15/2019 Medical Record Number: [id number] Patient Account Number: [id number] Date of Birth/Sex: 1945-09-13 (74 y.o. M) Treating RN: Primary Care Provider: Etheleen Nicks Other Clinician: Referring Provider: Etheleen Nicks Treating Provider/Extender: Linwood Dibbles, Novalee Horsfall Weeks in Treatment: 8 Diagnosis Coding ICD-10 Codes Code Description I87.331 Chronic venous hypertension (idiopathic) with ulcer and inflammation of right lower extremity L97.312 Non-pressure chronic ulcer of right ankle with fat layer exposed Facility Procedures CPT4 Code: 27035009 Description: 38182 - WOUND CARE VISIT-LEV 2 EST PT Modifier: Quantity: 1 Physician Procedures CPT4 Code Description: 9937169 99213 - WC PHYS LEVEL 3 - EST PT Modifier: Quantity: 1 CPT4 Code Description: ICD-10 Diagnosis Description I87.331 Chronic venous hypertension (idiopathic) with ulcer and inflammation of L97.312 Non-pressure chronic ulcer of right ankle with fat layer exposed Modifier: right lower extremit Quantity: y Psychologist, prison and probation services) Signed: 09/15/2019 1:47:57 PM By: Lenda Kelp PA-C Entered By: Lenda Kelp on 09/15/2019 13:47:56

## 2019-09-15 NOTE — Progress Notes (Addendum)
TOUA, STITES ([id number]) Visit Report for 09/15/2019 Arrival Information Details Patient Name: [name], [name]. Date of Service: 09/15/2019 12:30 PM Medical Record Number: [id number] Patient Account Number: [id number] Date of Birth/Sex: Apr 28, 1945 (74 y.o. M) Treating RN: Primary Care Mylei Brackeen: Etheleen Nicks Other Clinician: Referring Dimarco Minkin: Etheleen Nicks Treating Sonya Gunnoe/Extender: Linwood Dibbles, HOYT Weeks in Treatment: 8 Visit Information History Since Last Visit All ordered tests and consults were completed: No Patient Arrived: Ambulatory Added or deleted any medications: No Arrival Time: 12:55 Any new allergies or adverse reactions: No Accompanied By: wife Had a fall or experienced change in No Transfer Assistance: None activities of daily living that may affect Patient Identification Verified: Yes risk of falls: Secondary Verification Process Completed: Yes Signs or symptoms of abuse/neglect since last visito No Patient Requires Transmission-Based Precautions: No Hospitalized since last visit: No Patient Has Alerts: No Implantable device outside of the clinic excluding No cellular tissue based products placed in the center since last visit: Has Dressing in Place as Prescribed: Yes Has Compression in Place as Prescribed: Yes Pain Present Now: No Electronic Signature(s) Signed: 09/16/2019 8:00:18 AM By: Benna Dunks Entered By: Benna Dunks on 09/15/2019 12:55:41 Mapel, Jorje Guild ([id number]) -------------------------------------------------------------------------------- Clinic Level of Care Assessment Details Patient Name: [name]. Date of Service: 09/15/2019 12:30 PM Medical Record Number: [id number] Patient Account Number: [id number] Date of Birth/Sex: 18-Mar-1945 (74 y.o. M) Treating RN: Huel Coventry Primary Care Shawnita Krizek: Etheleen Nicks Other Clinician: Referring Kenzo Ozment: Etheleen Nicks Treating Jaquon Gingerich/Extender: Linwood Dibbles, HOYT Weeks in  Treatment: 8 Clinic Level of Care Assessment Items TOOL 4 Quantity Score []  - Use when only an EandM is performed on FOLLOW-UP visit 0 ASSESSMENTS - Nursing Assessment / Reassessment X - Reassessment of Co-morbidities (includes updates in patient status) 1 10 X- 1 5 Reassessment of Adherence to Treatment Plan ASSESSMENTS - Wound and Skin Assessment / Reassessment X - Simple Wound Assessment / Reassessment - one wound 1 5 []  - 0 Complex Wound Assessment / Reassessment - multiple wounds []  - 0 Dermatologic / Skin Assessment (not related to wound area) ASSESSMENTS - Focused Assessment []  - Circumferential Edema Measurements - multi extremities 0 []  - 0 Nutritional Assessment / Counseling / Intervention []  - 0 Lower Extremity Assessment (monofilament, tuning fork, pulses) []  - 0 Peripheral Arterial Disease Assessment (using hand held doppler) ASSESSMENTS - Ostomy and/or Continence Assessment and Care []  - Incontinence Assessment and Management 0 []  - 0 Ostomy Care Assessment and Management (repouching, etc.) PROCESS - Coordination of Care []  - Simple Patient / Family Education for ongoing care 0 []  - 0 Complex (extensive) Patient / Family Education for ongoing care []  - 0 Staff obtains , Records, Test Results / Process Orders []  - 0 Staff telephones HHA, Nursing Homes / Clarify orders / etc []  - 0 Routine Transfer to another Facility (non-emergent condition) []  - 0 Routine Hospital Admission (non-emergent condition) []  - 0 New Admissions / / Ordering NPWT, Apligraf, etc. []  - 0 Emergency Hospital Admission (emergent condition) X- 1 10 Simple Discharge Coordination []  - 0 Complex (extensive) Discharge Coordination PROCESS - Special Needs []  - Pediatric / Minor Patient Management 0 []  - 0 Isolation Patient Management []  - 0 Hearing / Language / Visual special needs []  - 0 Assessment of Community assistance (transportation, D/C planning,  etc.) []  - 0 Additional assistance / Altered mentation []  - 0 Support Surface(s) Assessment (bed, cushion, seat, etc.) INTERVENTIONS - Wound Cleansing / Measurement Rail, Kaio R. ( ) X- 1 5  Simple Wound Cleansing - one wound []  - 0 Complex Wound Cleansing - multiple wounds X- 1 5 Wound Imaging (photographs - any number of wounds) []  - 0 Wound Tracing (instead of photographs) []  - 0 Simple Wound Measurement - one wound []  - 0 Complex Wound Measurement - multiple wounds INTERVENTIONS - Wound Dressings []  - Small Wound Dressing one or multiple wounds 0 []  - 0 Medium Wound Dressing one or multiple wounds []  - 0 Large Wound Dressing one or multiple wounds []  - 0 Application of Medications - topical []  - 0 Application of Medications - injection INTERVENTIONS - Miscellaneous []  - External ear exam 0 []  - 0 Specimen Collection (cultures, biopsies, blood, body fluids, etc.) []  - 0 Specimen(s) / Culture(s) sent or taken to Lab for analysis []  - 0 Patient Transfer (multiple staff / / Similar devices) []  - 0 Simple Staple / Suture removal (25 or less) []  - 0 Complex Staple / Suture removal (26 or more) []  - 0 Hypo / Hyperglycemic Management (close monitor of Blood Glucose) []  - 0 Ankle / Brachial Index (ABI) - do not check if billed separately X- 1 5 Vital Signs Has the patient been seen at the hospital within the last three years: Yes Total Score: 45 Level Of Care: New/Established - Level 2 Electronic Signature(s) Signed: 09/15/2019 6:39:29 PM By: , BSN, RN, CWS, Kim RN, BSN Entered By: , BSN, RN, CWS, Kim on 09/15/2019 13:16:30 Berrocal, ( ) -------------------------------------------------------------------------------- Encounter Discharge Information Details Patient Name: . Date of Service: 09/15/2019 12:30 PM Medical Record Number: Patient Account Number: Date of Birth/Sex: 1945-12-25  (74 y.o. M) Treating RN: Nurse, adult Primary Care Zalan Shidler: Other Clinician: Referring Denisa Enterline: Treating Ryler Laskowski/Extender: , HOYT Weeks in Treatment: 8 Encounter Discharge Information Items Discharge Condition: Stable Ambulatory Status: Crutches Discharge Destination: Home Transportation: Private Auto Accompanied By: self Schedule Follow-up Appointment: No Clinical Summary of Care: Electronic Signature(s) Signed: 09/15/2019 6:39:29 PM By: 09/17/2019, BSN, RN, CWS, Kim RN, BSN Entered By: Elliot Gurney, BSN, RN, CWS, Kim on 09/15/2019 13:17:18 Willcutt, 09/17/2019 (Jorje Guild) -------------------------------------------------------------------------------- Lower Extremity Assessment Details Patient Name: SHA, AMER R. Date of Service: 09/15/2019 12:30 PM Medical Record Number: 09/17/2019 Patient Account Number: [id number] Date of Birth/Sex: [birth year] (74 y.o. M) Treating RN: Primary Care Jewelz Kobus: 62 Other Clinician: Referring Tecla Mailloux: Huel Coventry Treating Murdock Jellison/Extender: Etheleen Nicks, HOYT Weeks in Treatment: 8 Edema Assessment Assessed: [Left: No] [Right: Yes] Edema: [Left: Ye] [Right: s] Calf Left: Right: Point of Measurement: 39.5 cm From Medial Instep cm 36 cm Ankle Left: Right: Point of Measurement: 23 cm From Medial Instep cm 23 cm Vascular Assessment Pulses: Dorsalis Pedis Palpable: [Right:Yes] Posterior Tibial Palpable: [Right:Yes] Electronic Signature(s) Signed: 09/16/2019 8:00:18 AM By: Linwood Dibbles Entered By: 09/17/2019 on 09/15/2019 12:58:30 Takemoto, Elliot Gurney (09/17/2019) -------------------------------------------------------------------------------- Multi Wound Chart Details Patient Name: Jorje Guild. Date of Service: 09/15/2019 12:30 PM Medical Record Number: Eustace Pen Patient Account Number: 09/17/2019 Date of Birth/Sex: 1945/09/11 (74 y.o. M) Treating RN: 11/30/1945 Primary Care Mccall Will:  62 Other Clinician: Referring Claudetta Sallie: Etheleen Nicks Treating Lidia Clavijo/Extender: Etheleen Nicks, HOYT Weeks in Treatment: 8 Vital Signs Height(in): 69 Pulse(bpm): 56 Weight(lbs): 320 Blood Pressure(mmHg): 124/64 Body Mass Index(BMI): 47 Temperature(F): 98.5 Respiratory Rate(breaths/min): 20 Photos: [N/A:N/A] Wound Location: Right, Lateral Ankle N/A N/A Wounding Event: Blister N/A N/A Primary Etiology: Venous Leg Ulcer N/A N/A Comorbid History: Anemia, Sleep Apnea, N/A N/A Hypertension, Osteoarthritis Date Acquired: 06/20/2019 N/A N/A  Weeks of Treatment: 8 N/A N/A Wound Status: Open N/A N/A Measurements L x W x D (cm) 1x0.5x0.1 N/A N/A Area (cm) : 0.393 N/A N/A Volume (cm) : 0.039 N/A N/A % Reduction in Area: 92.60% N/A N/A % Reduction in Volume: 96.30% N/A N/A Classification: Full Thickness Without Exposed N/A N/A Support Structures Exudate Amount: Medium N/A N/A Exudate Type: Serous N/A N/A Exudate Color: amber N/A N/A Wound Margin: Flat and Intact N/A N/A Granulation Amount: Small (1-33%) N/A N/A Granulation Quality: Pink N/A N/A Necrotic Amount: Small (1-33%) N/A N/A Necrotic Tissue: Eschar N/A N/A Exposed Structures: Fat Layer (Subcutaneous Tissue) N/A N/A Exposed: Yes Fascia: No Tendon: No Muscle: No Joint: No Bone: No Epithelialization: Large (67-100%) N/A N/A Treatment Notes Electronic Signature(s) Signed: 09/15/2019 6:39:29 PM By: Elliot Gurney, BSN, RN, CWS, Kim RN, BSN Entered By: Elliot Gurney, BSN, RN, CWS, Kim on 09/15/2019 13:12:17 Strubel, Jorje Guild ([id number]) -------------------------------------------------------------------------------- Multi-Disciplinary Care Plan Details Patient Name: [name]. Date of Service: 09/15/2019 12:30 PM Medical Record Number: [id number] Patient Account Number: [id number] Date of Birth/Sex: [birth year] (74 y.o. M) Treating RN: Huel Coventry Primary Care Levia Waltermire: Etheleen Nicks Other Clinician: Referring Deigo Alonso:  Etheleen Nicks Treating Tahjae Durr/Extender: Skeet Simmer in Treatment: 8 Active Inactive Electronic Signature(s) Signed: 09/15/2019 6:39:29 PM By: Elliot Gurney, BSN, RN, CWS, Kim RN, BSN Entered By: Elliot Gurney, BSN, RN, CWS, Kim on 09/15/2019 13:14:57 Kiener, Jorje Guild ([id number]) -------------------------------------------------------------------------------- Pain Assessment Details Patient Name: [name]. Date of Service: 09/15/2019 12:30 PM Medical Record Number: [id number] Patient Account Number: [id number] Date of Birth/Sex: 1945/02/28 (74 y.o. M) Treating RN: Primary Care Aldora Perman: Etheleen Nicks Other Clinician: Referring Ottie Tillery: Etheleen Nicks Treating Shaletha Humble/Extender: Linwood Dibbles, HOYT Weeks in Treatment: 8 Active Problems Location of Pain Severity and Description of Pain Patient Has Paino No Site Locations With Dressing Change: No Pain Management and Medication Current Pain Management: Electronic Signature(s) Signed: 09/16/2019 8:00:18 AM By: Benna Dunks Entered By: Benna Dunks on 09/15/2019 12:56:20 Harewood, Jorje Guild ([id number]) -------------------------------------------------------------------------------- Patient/Caregiver Education Details Patient Name: [name]. Date of Service: 09/15/2019 12:30 PM Medical Record Number: [id number] Patient Account Number: [id number] Date of Birth/Gender: 03/19/1945 (74 y.o. M) Treating RN: Huel Coventry Primary Care Physician: Etheleen Nicks Other Clinician: Referring Physician: Etheleen Nicks Treating Physician/Extender: Skeet Simmer in Treatment: 8 Education Assessment Education Provided To: Patient Education Topics Provided Wound/Skin Impairment: Handouts: Caring for Your Ulcer Methods: Demonstration, Explain/Verbal Responses: State content correctly Electronic Signature(s) Signed: 09/15/2019 6:39:29 PM By: Elliot Gurney, BSN, RN, CWS, Kim RN, BSN Entered By: Elliot Gurney, BSN, RN, CWS, Kim on 09/15/2019  13:16:55 Sulewski, Jorje Guild ([id number]) -------------------------------------------------------------------------------- Wound Assessment Details Patient Name: [name]. Date of Service: 09/15/2019 12:30 PM Medical Record Number: [id number] Patient Account Number: [id number] Date of Birth/Sex: 03-Sep-1945 (74 y.o. M) Treating RN: Huel Coventry Primary Care Leilene Diprima: Etheleen Nicks Other Clinician: Referring Tria Noguera: Etheleen Nicks Treating Raul Winterhalter/Extender: Linwood Dibbles, HOYT Weeks in Treatment: 8 Wound Status Wound Number: 7 Primary Etiology: Venous Leg Ulcer Wound Location: Right, Lateral Ankle Wound Status: Healed - Epithelialized Wounding Event: Blister Comorbid History: Anemia, Sleep Apnea, Hypertension, Osteoarthritis Date Acquired: 06/20/2019 Weeks Of Treatment: 8 Clustered Wound: No Photos Wound Measurements Length: (cm) 0 Width: (cm) 0 Depth: (cm) 0 Area: (cm) Volume: (cm) % Reduction in Area: 100% % Reduction in Volume: 100% Epithelialization: Large (67-100%) 0 0 Wound Description Classification: Full Thickness Without Exposed Support Structu Wound Margin: Flat and Intact Exudate Amount: Medium Exudate Type: Serous Exudate Color: amber res Foul Odor After Cleansing: No  Slough/Fibrino Yes Wound Bed Granulation Amount: Small (1-33%) Exposed Structure Granulation Quality: Pink Fascia Exposed: No Necrotic Amount: Small (1-33%) Fat Layer (Subcutaneous Tissue) Exposed: Yes Necrotic Quality: Eschar Tendon Exposed: No Muscle Exposed: No Joint Exposed: No Bone Exposed: No Electronic Signature(s) Signed: 09/15/2019 6:39:29 PM By: Elliot GurneyWoody, BSN, RN, CWS, Kim RN, BSN Entered By: Elliot GurneyWoody, BSN, RN, CWS, Kim on 09/15/2019 13:14:30 Nesbitt, [name] ([id number]) -------------------------------------------------------------------------------- Vitals Details Patient Name: [name] AlmaSHAFAR, Keithen R. Date of Service: 09/15/2019 12:30 PM Medical Record Number: [id number] Patient  Account Number: [id number] Date of Birth/Sex: 09/18/1945 (74 y.o. M) Treating RN: Primary Care Ivonne Freeburg: Etheleen NicksMACDONALD, KERI Other Clinician: Referring Abisola Carrero: Etheleen NicksMACDONALD, KERI Treating Yamilka Lopiccolo/Extender: Linwood DibblesSTONE III, HOYT Weeks in Treatment: 8 Vital Signs Time Taken: 12:40 Temperature (F): 98.5 Height (in): 69 Pulse (bpm): 56 Weight (lbs): 320 Respiratory Rate (breaths/min): 20 Body Mass Index (BMI): 47.3 Blood Pressure (mmHg): 124/64 Reference Range: 80 - 120 mg / dl Electronic Signature(s) Signed: 09/16/2019 8:00:18 AM By: Benna DunksLineberry, Carol Entered By: Benna DunksLineberry, Carol on 09/15/2019 12:56:10

## 2019-09-22 ENCOUNTER — Ambulatory Visit (INDEPENDENT_AMBULATORY_CARE_PROVIDER_SITE_OTHER): Payer: Medicare Other | Admitting: Physician Assistant

## 2019-09-22 ENCOUNTER — Other Ambulatory Visit: Payer: Self-pay

## 2019-09-22 VITALS — BP 132/78 | HR 76 | Ht 70.0 in | Wt 338.0 lb

## 2019-09-22 DIAGNOSIS — Z435 Encounter for attention to cystostomy: Secondary | ICD-10-CM | POA: Diagnosis not present

## 2019-09-22 NOTE — Progress Notes (Signed)
Suprapubic Cath Change  Patient is present today for a suprapubic catheter change due to urinary retention.  32ml of water was drained from the balloon, a 16FR foley cath was removed from the tract without difficulty.  Site was cleaned and prepped in a sterile fashion with betadine.  A 16FR foley cath was replaced into the tract no complications were noted. Urine return was not noted, 10 ml of sterile water was inflated into the balloon and a leg bag on extension tubing was attached for drainage.  Patient tolerated well and was counseled to return to clinic if he did not develop urinary drainage into the leg bag within 2 hours.    Performed by: Carman Ching, PA-C and Franchot Erichsen, CMA  Follow up: Return in about 4 weeks (around 10/20/2019) for SPT exchange.

## 2019-10-19 ENCOUNTER — Other Ambulatory Visit: Payer: Self-pay

## 2019-10-19 ENCOUNTER — Ambulatory Visit (INDEPENDENT_AMBULATORY_CARE_PROVIDER_SITE_OTHER): Payer: Medicare Other | Admitting: Physician Assistant

## 2019-10-19 DIAGNOSIS — Z435 Encounter for attention to cystostomy: Secondary | ICD-10-CM | POA: Diagnosis not present

## 2019-10-19 NOTE — Progress Notes (Signed)
Suprapubic Cath Change  Patient is present today for a suprapubic catheter change due to urinary retention.  86ml of water was drained from the balloon, a 16FR foley cath was removed from the tract without difficulty.  Site was cleaned and prepped in a sterile fashion with betadine.  A 16FR foley cath on extension tubing was replaced into the tract no complications were noted. Urine return was noted, 10 ml of sterile water was inflated into the balloon and a leg bag was attached for drainage.  Patient tolerated well.    Performed by: Carman Ching, PA-C   Follow up: Return in about 4 weeks (around 11/16/2019) for SPT exchange.

## 2019-11-21 ENCOUNTER — Ambulatory Visit (INDEPENDENT_AMBULATORY_CARE_PROVIDER_SITE_OTHER): Payer: Medicare Other | Admitting: Physician Assistant

## 2019-11-21 ENCOUNTER — Other Ambulatory Visit: Payer: Self-pay

## 2019-11-21 DIAGNOSIS — R339 Retention of urine, unspecified: Secondary | ICD-10-CM | POA: Diagnosis not present

## 2019-11-21 NOTE — Progress Notes (Signed)
Suprapubic Cath Change  Patient is present today for a suprapubic catheter change due to urinary retention.  70ml of water was drained from the balloon, a 16FR foley cath was removed from the tract without difficulty.  Site was cleaned and prepped in a sterile fashion with betadine.  A 16FR foley cath was replaced into the tract no complications were noted. Urine return was noted, 10 ml of sterile water was inflated into the balloon and a leg bag on extension tubing was attached for drainage.  Patient tolerated well.     Performed by: Carman Ching, PA-C   Follow up: Return in about 4 weeks (around 12/19/2019) for SPT exchange.

## 2019-12-26 ENCOUNTER — Ambulatory Visit (INDEPENDENT_AMBULATORY_CARE_PROVIDER_SITE_OTHER): Payer: Medicare Other | Admitting: Physician Assistant

## 2019-12-26 ENCOUNTER — Encounter: Payer: Self-pay | Admitting: Physician Assistant

## 2019-12-26 ENCOUNTER — Other Ambulatory Visit: Payer: Self-pay

## 2019-12-26 VITALS — BP 156/80 | HR 75 | Ht 70.0 in | Wt 340.0 lb

## 2019-12-26 DIAGNOSIS — R339 Retention of urine, unspecified: Secondary | ICD-10-CM | POA: Diagnosis not present

## 2019-12-26 NOTE — Progress Notes (Signed)
Suprapubic Cath Change  Patient is present today for a suprapubic catheter change due to urinary retention.  35ml of water was drained from the balloon, a 16FR foley cath was removed from the tract without difficulty.  Site was cleaned and prepped in a sterile fashion with betadine.  A 16FR foley cath was replaced into the tract no complications were noted. Urine return was noted, 10 ml of sterile water was inflated into the balloon and a leg bag on extension tubing was attached for drainage.  Patient tolerated well.   Performed by: Carman Ching, PA-C and Franchot Erichsen, CMA  Follow up: No follow-ups on file.

## 2020-01-23 ENCOUNTER — Other Ambulatory Visit: Payer: Self-pay

## 2020-01-23 ENCOUNTER — Ambulatory Visit (INDEPENDENT_AMBULATORY_CARE_PROVIDER_SITE_OTHER): Payer: Medicare Other | Admitting: Physician Assistant

## 2020-01-23 DIAGNOSIS — Z435 Encounter for attention to cystostomy: Secondary | ICD-10-CM

## 2020-01-23 NOTE — Progress Notes (Signed)
Suprapubic Cath Change  Patient is present today for a suprapubic catheter change due to urinary retention.  66ml of water was drained from the balloon, a 16FR foley cath was removed from the tract without difficulty.  Site was cleaned and prepped in a sterile fashion with betadine.  A 16FR foley cath was replaced into the tract no complications were noted. Urine return was noted, 10 ml of sterile water was inflated into the balloon and a leg bag on extension tubing was attached for drainage.  Patient tolerated well.   Performed by: Carman Ching, PA-C and Franchot Erichsen, CMA  Follow up: Return in about 4 weeks (around 02/20/2020) for SPT exchange.

## 2020-02-20 ENCOUNTER — Ambulatory Visit: Payer: Self-pay | Admitting: Physician Assistant

## 2020-02-22 ENCOUNTER — Ambulatory Visit (INDEPENDENT_AMBULATORY_CARE_PROVIDER_SITE_OTHER): Payer: Medicare Other | Admitting: Physician Assistant

## 2020-02-22 ENCOUNTER — Other Ambulatory Visit: Payer: Self-pay

## 2020-02-22 DIAGNOSIS — Z435 Encounter for attention to cystostomy: Secondary | ICD-10-CM

## 2020-02-22 NOTE — Progress Notes (Signed)
Suprapubic Cath Change  Patient is present today for a suprapubic catheter change due to urinary retention.  61ml of water was drained from the balloon, a 16FR foley cath was removed from the tract without difficulty.  Site was cleaned and prepped in a sterile fashion with betadine.  A 16FR foley cath was replaced into the tract no complications were noted. Urine return was noted, 10 ml of sterile water was inflated into the balloon and a leg bag on extension tubing was attached.  Patient tolerated well.  Performed by: Carman Ching, PA-C   Follow up: Return in about 4 weeks (around 03/21/2020) for SPT exchange.

## 2020-03-26 ENCOUNTER — Other Ambulatory Visit: Payer: Self-pay

## 2020-03-26 ENCOUNTER — Ambulatory Visit (INDEPENDENT_AMBULATORY_CARE_PROVIDER_SITE_OTHER): Payer: Medicare Other | Admitting: Physician Assistant

## 2020-03-26 DIAGNOSIS — Z435 Encounter for attention to cystostomy: Secondary | ICD-10-CM

## 2020-03-26 NOTE — Progress Notes (Signed)
Suprapubic Cath Change  Patient is present today for a suprapubic catheter change due to urinary retention.  34ml of water was drained from the balloon, a 16FR foley cath was removed from the tract with out difficulty.  Site was cleaned and prepped in a sterile fashion with betadine.  A 16FR foley cath was replaced into the tract no complications were noted. Urine return was not noted and patient was counseled to return to clinic if he had not begun to drain within 2 hours, 10 ml of sterile water was inflated into the balloon and a leg bag on extension tubing was attached for drainage.  Patient tolerated well.  Performed by: Carman Ching, PA-C and Ples Specter, CMA  Follow up: Return in about 4 weeks (around 04/23/2020) for SPT exchange.

## 2020-04-24 ENCOUNTER — Other Ambulatory Visit: Payer: Self-pay

## 2020-04-24 ENCOUNTER — Ambulatory Visit (INDEPENDENT_AMBULATORY_CARE_PROVIDER_SITE_OTHER): Payer: Medicare Other | Admitting: Physician Assistant

## 2020-04-24 DIAGNOSIS — Z435 Encounter for attention to cystostomy: Secondary | ICD-10-CM | POA: Diagnosis not present

## 2020-04-24 NOTE — Progress Notes (Signed)
Suprapubic Cath Change  Patient is present today for a suprapubic catheter change due to urinary retention.  24ml of water was drained from the balloon, a 16FR foley cath was removed from the tract without difficulty.  Site was cleaned and prepped in a sterile fashion with betadine.  A 16FR foley cath was replaced into the tract no complications were noted. Urine return was noted, 10 ml of sterile water was inflated into the balloon and a leg bag on extension tubing was attached for drainage.  Patient tolerated well.   Performed by: Carman Ching, PA-C and Franchot Erichsen, CMA  Follow up: Return in about 4 weeks (around 05/22/2020) for SPT exchange.

## 2020-05-25 ENCOUNTER — Ambulatory Visit (INDEPENDENT_AMBULATORY_CARE_PROVIDER_SITE_OTHER): Payer: Medicare Other | Admitting: Physician Assistant

## 2020-05-25 ENCOUNTER — Other Ambulatory Visit: Payer: Self-pay

## 2020-05-25 DIAGNOSIS — Z435 Encounter for attention to cystostomy: Secondary | ICD-10-CM

## 2020-05-25 NOTE — Progress Notes (Signed)
Suprapubic Cath Change  Patient is present today for a suprapubic catheter change due to urinary retention.  18ml of water was drained from the balloon, a 16FR foley cath was removed from the tract without difficulty.  Site was cleaned and prepped in a sterile fashion with betadine.  A 16FR foley cath was replaced into the tract no complications were noted. Urine return was not noted and patient was counseled to RTC if no drainage within 2 hours, 10 ml of sterile water was inflated into the balloon and a leg bag on extension tubing was attached for drainage.  Patient tolerated well. A night bag was given to patient and proper instruction was given on how to switch bags.    Performed by: Carman Ching, PA-C and Franchot Erichsen, CMA  Follow up: Return in about 4 weeks (around 06/22/2020) for SPT exchange.

## 2020-06-22 ENCOUNTER — Other Ambulatory Visit: Payer: Self-pay

## 2020-06-22 ENCOUNTER — Ambulatory Visit (INDEPENDENT_AMBULATORY_CARE_PROVIDER_SITE_OTHER): Payer: Medicare Other | Admitting: Physician Assistant

## 2020-06-22 ENCOUNTER — Ambulatory Visit: Payer: Self-pay | Admitting: Physician Assistant

## 2020-06-22 DIAGNOSIS — Z435 Encounter for attention to cystostomy: Secondary | ICD-10-CM | POA: Diagnosis not present

## 2020-06-22 NOTE — Progress Notes (Signed)
Suprapubic Cath Change  Patient is present today for a suprapubic catheter change due to urinary retention.  62ml of water was drained from the balloon, a 16FR foley cath was removed from the tract with out difficulty.  Site was cleaned and prepped in a sterile fashion with betadine.  A 16FR foley cath was replaced into the tract no complications were noted. Urine return was noted, 10 ml of sterile water was inflated into the balloon and a leg bag on extension tubing was attached for drainage.  Patient tolerated well.   Performed by: Carman Ching, PA-C and Franchot Erichsen, CMA  Follow up: Return in about 4 weeks (around 07/20/2020) for SPT exchange.

## 2020-07-20 ENCOUNTER — Ambulatory Visit (INDEPENDENT_AMBULATORY_CARE_PROVIDER_SITE_OTHER): Payer: Medicare Other | Admitting: Physician Assistant

## 2020-07-20 ENCOUNTER — Other Ambulatory Visit: Payer: Self-pay

## 2020-07-20 DIAGNOSIS — Z435 Encounter for attention to cystostomy: Secondary | ICD-10-CM

## 2020-07-20 NOTE — Progress Notes (Signed)
Suprapubic Cath Change  Patient is present today for a suprapubic catheter change due to urinary retention.  2ml of water was drained from the balloon, a 16FR foley cath was removed from the tract with out difficulty.  Site was cleaned and prepped in a sterile fashion with betadine.  A 16FR foley cath was replaced into the tract no complications were noted. Urine return was not noted and patient was counseled to return to clinic if he did not have urinary return within 2 hours, 10 ml of sterile water was inflated into the balloon and a leg bag on extension tubing was attached for drainage.  Patient tolerated well. A night bag was given to patient and proper instruction was given on how to switch bags.    Performed by: Carman Ching, PA-C and Franchot Erichsen, CMA  Follow up: Return in about 4 weeks (around 08/17/2020) for SPT exchange.

## 2020-08-17 ENCOUNTER — Other Ambulatory Visit: Payer: Self-pay

## 2020-08-17 ENCOUNTER — Ambulatory Visit (INDEPENDENT_AMBULATORY_CARE_PROVIDER_SITE_OTHER): Payer: Medicare Other | Admitting: Physician Assistant

## 2020-08-17 DIAGNOSIS — Z435 Encounter for attention to cystostomy: Secondary | ICD-10-CM

## 2020-08-17 NOTE — Progress Notes (Signed)
Suprapubic Cath Change  Patient is present today for a suprapubic catheter change due to urinary retention.  4ml of water was drained from the balloon, a 16FR foley cath was removed from the tract with out difficulty.  Site was cleaned and prepped in a sterile fashion with betadine.  A 16FR foley cath was replaced into the tract no complications were noted. Urine return was not noted and patient was counseled to RTC if no drainage within 2 hours, 10 ml of sterile water was inflated into the balloon and a leg bag on extension tubing was attached for drainage.  Patient tolerated well.    Performed by: Carman Ching, PA-C   Follow up: Return in about 4 weeks (around 09/14/2020) for SPT exchange.

## 2020-09-25 ENCOUNTER — Other Ambulatory Visit: Payer: Self-pay

## 2020-09-25 ENCOUNTER — Ambulatory Visit (INDEPENDENT_AMBULATORY_CARE_PROVIDER_SITE_OTHER): Payer: Medicare Other | Admitting: Physician Assistant

## 2020-09-25 DIAGNOSIS — Z435 Encounter for attention to cystostomy: Secondary | ICD-10-CM | POA: Diagnosis not present

## 2020-09-25 NOTE — Progress Notes (Signed)
Suprapubic Cath Change  Patient is present today for a suprapubic catheter change due to urinary retention.  60ml of water was drained from the balloon, a 16FR foley cath was removed from the tract without difficulty.  Site was cleaned and prepped in a sterile fashion with betadine.  A 16FR foley cath was replaced into the tract no complications were noted. Urine return was not noted and patient was counseled to RTC if no drainage noted within 2 hours, 10 ml of sterile water was inflated into the balloon and a leg bag on extension tubing was attached for drainage.  Patient tolerated well.     Performed by: Carman Ching, PA-C and Franchot Erichsen, CMA  Follow up: Return in about 4 weeks (around 10/23/2020) for SPT exchange.

## 2020-10-26 ENCOUNTER — Ambulatory Visit: Payer: Medicare Other | Admitting: Physician Assistant

## 2020-10-31 ENCOUNTER — Other Ambulatory Visit: Payer: Self-pay

## 2020-10-31 ENCOUNTER — Ambulatory Visit (INDEPENDENT_AMBULATORY_CARE_PROVIDER_SITE_OTHER): Payer: Medicare Other | Admitting: Physician Assistant

## 2020-10-31 DIAGNOSIS — Z435 Encounter for attention to cystostomy: Secondary | ICD-10-CM | POA: Diagnosis not present

## 2020-10-31 NOTE — Progress Notes (Signed)
Suprapubic Cath Change  Patient is present today for a suprapubic catheter change due to urinary retention.  93ml of water was drained from the balloon, a 16FR foley cath was removed from the tract without difficulty.  Site was cleaned and prepped in a sterile fashion with betadine.  A 16FR foley cath was replaced into the tract no complications were noted. Urine return was noted, 10 ml of sterile water was inflated into the balloon and a leg bag on extension tubing was attached for drainage.  Patient tolerated well.     Performed by: Carman Ching, PA-C and Franchot Erichsen, CMA  Follow up: Return in about 4 weeks (around 11/28/2020) for SPT exchange.

## 2020-11-30 ENCOUNTER — Other Ambulatory Visit: Payer: Self-pay

## 2020-11-30 ENCOUNTER — Ambulatory Visit (INDEPENDENT_AMBULATORY_CARE_PROVIDER_SITE_OTHER): Payer: Medicare Other | Admitting: Physician Assistant

## 2020-11-30 DIAGNOSIS — Z435 Encounter for attention to cystostomy: Secondary | ICD-10-CM | POA: Diagnosis not present

## 2020-11-30 NOTE — Progress Notes (Signed)
Suprapubic Cath Change  Patient is present today for a suprapubic catheter change due to urinary retention.  55ml of water was drained from the balloon, a 16FR foley cath was removed from the tract without difficulty.  Site was cleaned and prepped in a sterile fashion with betadine.  A 16FR foley cath was replaced into the tract no complications were noted. Urine return was not noted and patient was counseled to RTC if no drainage within 2 hours, 10 ml of sterile water was inflated into the balloon and a leg bag on extension tubing was attached for drainage.  Patient tolerated well.    Performed by: Carman Ching, PA-C   Follow up: Return in about 4 weeks (around 12/28/2020) for SPT exchange.

## 2020-12-28 ENCOUNTER — Other Ambulatory Visit: Payer: Self-pay

## 2020-12-28 ENCOUNTER — Ambulatory Visit (INDEPENDENT_AMBULATORY_CARE_PROVIDER_SITE_OTHER): Payer: Medicare Other | Admitting: Physician Assistant

## 2020-12-28 DIAGNOSIS — Z435 Encounter for attention to cystostomy: Secondary | ICD-10-CM

## 2020-12-28 DIAGNOSIS — B372 Candidiasis of skin and nail: Secondary | ICD-10-CM

## 2020-12-28 MED ORDER — KETOCONAZOLE 2 % EX CREA
TOPICAL_CREAM | CUTANEOUS | 1 refills | Status: DC
Start: 2020-12-28 — End: 2021-08-21

## 2020-12-28 NOTE — Progress Notes (Signed)
Suprapubic Cath Change  Patient is present today for a suprapubic catheter change due to urinary retention.  21ml of water was drained from the balloon, a 16FR foley cath was removed from the tract with out difficulty.  Site was cleaned and prepped in a sterile fashion with betadine.  A 16FR foley cath was replaced into the tract no complications were noted. Urine return was not noted and patient was counseled to RTC if no drainage within 2 hours, 10 ml of sterile water was inflated into the balloon and a leg bag on extension tubing was attached for drainage.  Patient tolerated well.    Performed by: Carman Ching, PA-C and Franchot Erichsen, CMA  Additional notes: Mild candidal intertrigo under the panniculus noted on physical exam today. Prescribing empiric ketoconazole cream to treat.  Follow up: Return in about 4 weeks (around 01/25/2021) for SPT exchange.

## 2021-01-25 ENCOUNTER — Ambulatory Visit (INDEPENDENT_AMBULATORY_CARE_PROVIDER_SITE_OTHER): Payer: Medicare Other | Admitting: Physician Assistant

## 2021-01-25 ENCOUNTER — Ambulatory Visit
Admission: RE | Admit: 2021-01-25 | Discharge: 2021-01-25 | Disposition: A | Discharge status: Home / Self Care | Payer: Medicare Other | Source: Ambulatory Visit | Attending: Physician Assistant | Admitting: Physician Assistant

## 2021-01-25 ENCOUNTER — Other Ambulatory Visit: Payer: Self-pay

## 2021-01-25 DIAGNOSIS — R3989 Other symptoms and signs involving the genitourinary system: Secondary | ICD-10-CM

## 2021-01-25 DIAGNOSIS — Z435 Encounter for attention to cystostomy: Secondary | ICD-10-CM | POA: Diagnosis not present

## 2021-01-25 NOTE — Progress Notes (Signed)
Suprapubic Cath Change  Patient is present today for a suprapubic catheter change due to urinary retention.  67ml of water was drained from the balloon, a 16FR foley cath was removed from the tract with out difficulty.  Site was cleaned and prepped in a sterile fashion with betadine.  A 16FR foley cath was replaced into the tract no complications were noted. Urine return was noted, 10 ml of sterile water was inflated into the balloon and a leg bag on extension tubing was attached for drainage.  Patient tolerated well.     Performed by: Carman Ching, PA-C and Franchot Erichsen, CMA  Additional notes: Patient and wife report today that he is having episodes up to 3 times monthly in which he stops draining urine from his penis for 1-3 hours accompanied by severe pain. He will apply a hot compress and eventually penile drainage will resume. They have not noticed a difference or interruption in his SPT drainage associated with this. Differential includes bladder spasm vs possible bladder stone. Offered them cystoscopy versus KUB and they prefer to proceed with the latter. Clinically not infected today. Will call with KUB results. We discussed that if bladder stone is confirmed, treatment options will include cystolitholapaxy.  Follow up: Return in about 4 weeks (around 02/22/2021) for SPT exchange.    I spent 15 minutes on the day of the encounter to include pre-visit record review, face-to-face time with the patient, and post-visit ordering of tests.

## 2021-01-25 NOTE — Patient Instructions (Signed)
[name] is the product I told you about today.

## 2021-01-30 ENCOUNTER — Telehealth: Payer: Self-pay

## 2021-01-30 NOTE — Telephone Encounter (Signed)
-----   Message from Carman Ching, [place] sent at 01/29/2021  5:45 PM EST ----- No bladder stone seen on KUB, great news. ----- Message ----- From: Interface, Rad Results In Sent: 01/28/2021   3:49 PM EST To: Carman Ching, PA-C

## 2021-01-30 NOTE — Telephone Encounter (Signed)
Notified patient as advised, patient expressed understanding.  

## 2021-02-19 NOTE — Progress Notes (Signed)
Suprapubic Cath Change  Patient is present today for a suprapubic catheter change due to urinary retention.  9 ml of water was drained from the balloon, a 16 FR foley cath was removed from the tract with out difficulty.  Site was cleaned and prepped in a sterile fashion with betadine.  A 16 FR Silastic foley cath was replaced into the tract no complications were noted. Urine return was noted, 10 ml of sterile water was inflated into the balloon and a leg bag was attached for drainage.  Patient tolerated well. A night bag was given to patient and proper instruction was given on how to switch bags.    Performed by: Michiel Cowboy, PA-C and Rebeca A Morris, CMA  Follow up: One month for SPT exchange

## 2021-02-20 ENCOUNTER — Ambulatory Visit (INDEPENDENT_AMBULATORY_CARE_PROVIDER_SITE_OTHER): Payer: Medicare Other | Admitting: Urology

## 2021-02-20 ENCOUNTER — Other Ambulatory Visit: Payer: Self-pay

## 2021-02-20 DIAGNOSIS — Z435 Encounter for attention to cystostomy: Secondary | ICD-10-CM | POA: Diagnosis not present

## 2021-03-19 NOTE — Progress Notes (Signed)
Cath Change/ Replacement  Patient is present today for a catheter change due to urinary retention.  9 ml of water was removed from the balloon, a 16 FR foley cath was removed with out difficulty.  Patient was cleaned and prepped in a sterile fashion with betadine. A 16 FR Silastic foley cath was replaced into the bladder no complications were noted Urine return was noted 10 ml and urine was yellow clear in color. The balloon was filled with 95ml of sterile water. A leg bag was attached for drainage.  A night bag was also given to the patient and patient was given instruction on how to change from one bag to another. Patient was given proper instruction on catheter care.    Performed by: Michiel Cowboy, PA-C   Follow up: One month follow up for Foley catheter exchange

## 2021-03-20 ENCOUNTER — Other Ambulatory Visit: Payer: Self-pay

## 2021-03-20 ENCOUNTER — Ambulatory Visit (INDEPENDENT_AMBULATORY_CARE_PROVIDER_SITE_OTHER): Payer: Medicare Other | Admitting: Urology

## 2021-03-20 DIAGNOSIS — Z435 Encounter for attention to cystostomy: Secondary | ICD-10-CM | POA: Diagnosis not present

## 2021-04-23 NOTE — Progress Notes (Signed)
Cath Change/ Replacement ? ?Patient is present today for a catheter change due to urinary retention.  80ml of water was removed from the balloon, a 16 FR silastic foley cath was removed with out difficulty.  Patient was cleaned and prepped in a sterile fashion with betadine. A 16  FR foley cath was replaced into the bladder no complications were noted Urine return was noted 20 ml and urine was dark pink/clear in color. The balloon was filled with 52ml of sterile water. A leg bag was attached for drainage.  A night bag was also given to the patient and patient was given instruction on how to change from one bag to another. Patient was given proper instruction on catheter care.   ? ?Performed by: Michiel Cowboy, PA-C and Erasmo Leventhal, CMA ? ?Follow up: Return in 1 month for SPT change  ? ? ?

## 2021-04-24 ENCOUNTER — Ambulatory Visit (INDEPENDENT_AMBULATORY_CARE_PROVIDER_SITE_OTHER): Payer: Medicare Other | Admitting: Urology

## 2021-04-24 VITALS — Wt 326.4 lb

## 2021-04-24 DIAGNOSIS — Z435 Encounter for attention to cystostomy: Secondary | ICD-10-CM | POA: Diagnosis not present

## 2021-04-24 DIAGNOSIS — R339 Retention of urine, unspecified: Secondary | ICD-10-CM | POA: Diagnosis not present

## 2021-05-20 ENCOUNTER — Ambulatory Visit (INDEPENDENT_AMBULATORY_CARE_PROVIDER_SITE_OTHER): Payer: Medicare Other | Admitting: Physician Assistant

## 2021-05-20 DIAGNOSIS — Z435 Encounter for attention to cystostomy: Secondary | ICD-10-CM | POA: Diagnosis not present

## 2021-05-20 DIAGNOSIS — R339 Retention of urine, unspecified: Secondary | ICD-10-CM

## 2021-05-20 NOTE — Progress Notes (Signed)
Suprapubic Cath Change ? ?Patient is present today for a suprapubic catheter change due to urinary retention.  53ml of water was drained from the balloon, a 16FR foley cath was removed from the tract without difficulty.  Site was cleaned and prepped in a sterile fashion with betadine.  A 16FR Silastic foley cath was replaced into the tract no complications were noted. Urine return was noted, 10 ml of sterile water was inflated into the balloon and a leg bag on extension tubingwas attached for drainage.  Patient tolerated well.    ? ?Performed by: Carman Ching, PA-C  and Ples Specter, CMA ? ?Follow up: Return in about 4 weeks (around 06/17/2021) for SPT exchange.   ?

## 2021-06-17 ENCOUNTER — Ambulatory Visit: Payer: Medicare Other | Admitting: Urology

## 2021-06-21 ENCOUNTER — Ambulatory Visit (INDEPENDENT_AMBULATORY_CARE_PROVIDER_SITE_OTHER): Payer: Medicare Other | Admitting: Physician Assistant

## 2021-06-21 DIAGNOSIS — Z435 Encounter for attention to cystostomy: Secondary | ICD-10-CM | POA: Diagnosis not present

## 2021-06-21 DIAGNOSIS — R339 Retention of urine, unspecified: Secondary | ICD-10-CM

## 2021-06-21 NOTE — Progress Notes (Signed)
Suprapubic Cath Change  Patient is present today for a suprapubic catheter change due to urinary retention.  36ml of water was drained from the balloon, a 16FR Silastic foley cath was removed from the tract without difficulty.  Site was cleaned and prepped in a sterile fashion with betadine.  A 16FR Silastic foley cath was replaced into the tract no complications were noted. Urine return was not noted and patient was counseled to RTC with nondraining catheter, 10 ml of sterile water was inflated into the balloon and a leg bag on extension tubing was attached for drainage.  Patient tolerated well.    Performed by: Debroah Loop, PA-C  Additional notes: Patient reports gross hematuria x2 days after his most recent catheter change. He is not clinically infected today, however per my view I feel he has had more discomfort with his past several exchanges, though he disagrees. Will continue to monitor; we discussed that worsening gross hematuria or pain with exchange may warrant cysto under anesthesia.  Follow up: Return in about 4 weeks (around 07/19/2021) for SPT exchange.

## 2021-07-24 ENCOUNTER — Ambulatory Visit (INDEPENDENT_AMBULATORY_CARE_PROVIDER_SITE_OTHER): Payer: Medicare Other | Admitting: Physician Assistant

## 2021-07-24 DIAGNOSIS — Z435 Encounter for attention to cystostomy: Secondary | ICD-10-CM | POA: Diagnosis not present

## 2021-07-24 DIAGNOSIS — N4889 Other specified disorders of penis: Secondary | ICD-10-CM | POA: Diagnosis not present

## 2021-07-24 DIAGNOSIS — R310 Gross hematuria: Secondary | ICD-10-CM

## 2021-07-24 DIAGNOSIS — R3989 Other symptoms and signs involving the genitourinary system: Secondary | ICD-10-CM

## 2021-07-24 DIAGNOSIS — R339 Retention of urine, unspecified: Secondary | ICD-10-CM | POA: Diagnosis not present

## 2021-07-24 NOTE — Progress Notes (Signed)
Suprapubic Cath Change  Patient is present today for a suprapubic catheter change due to urinary retention.  38ml of water was drained from the balloon, a 16FR foley cath was removed from the tract with out difficulty.  Site was cleaned and prepped in a sterile fashion with betadine.  A 16FR foley cath was replaced into the tract no complications were noted. Urine return was noted, 10 ml of sterile water was inflated into the balloon and a leg bag on extension tubing was attached for drainage.  Patient tolerated well.     Performed by: Carman Ching, PA-C   Additional notes: Patient continues to report monthly episodes of extreme penile pain lasting several hours. With his other recent report of gross hematuria, I recommend proceeding with CTU at this time. Patient is in agreement with this plan; study ordered.  Follow up: Return in about 4 weeks (around 08/21/2021) for SPT exchange.

## 2021-08-07 ENCOUNTER — Ambulatory Visit
Admission: RE | Admit: 2021-08-07 | Discharge: 2021-08-07 | Disposition: A | Discharge status: Home / Self Care | Payer: Medicare Other | Source: Ambulatory Visit | Attending: Physician Assistant | Admitting: Physician Assistant

## 2021-08-07 DIAGNOSIS — R3989 Other symptoms and signs involving the genitourinary system: Secondary | ICD-10-CM | POA: Insufficient documentation

## 2021-08-07 DIAGNOSIS — R310 Gross hematuria: Secondary | ICD-10-CM | POA: Insufficient documentation

## 2021-08-07 LAB — POCT I-STAT CREATININE: Creatinine, Ser: 1.5 mg/dL — ABNORMAL HIGH (ref 0.61–1.24)

## 2021-08-07 MED ORDER — IOHEXOL 300 MG/ML  SOLN
125.0000 mL | Freq: Once | INTRAMUSCULAR | Status: AC | PRN
  Administered 2021-08-07: 125 mL via INTRAVENOUS

## 2021-08-21 ENCOUNTER — Ambulatory Visit (INDEPENDENT_AMBULATORY_CARE_PROVIDER_SITE_OTHER): Payer: Medicare Other | Admitting: Physician Assistant

## 2021-08-21 DIAGNOSIS — B372 Candidiasis of skin and nail: Secondary | ICD-10-CM | POA: Diagnosis not present

## 2021-08-21 DIAGNOSIS — L929 Granulomatous disorder of the skin and subcutaneous tissue, unspecified: Secondary | ICD-10-CM

## 2021-08-21 DIAGNOSIS — Z435 Encounter for attention to cystostomy: Secondary | ICD-10-CM | POA: Diagnosis not present

## 2021-08-21 DIAGNOSIS — R3989 Other symptoms and signs involving the genitourinary system: Secondary | ICD-10-CM

## 2021-08-21 DIAGNOSIS — R339 Retention of urine, unspecified: Secondary | ICD-10-CM | POA: Diagnosis not present

## 2021-08-21 MED ORDER — KETOCONAZOLE 2 % EX CREA: TOPICAL_CREAM | CUTANEOUS | 1 refills | Status: DC

## 2021-08-21 NOTE — Progress Notes (Signed)
Suprapubic Cath Change  Patient is present today for a suprapubic catheter change due to urinary retention.  91ml of water was drained from the balloon, a 16FR Silastic foley cath was removed from the tract without difficulty.  Site was cleaned and prepped in a sterile fashion with betadine.  A 16FR foley cath was replaced into the tract no complications were noted. Urine return was noted, 10 ml of sterile water was inflated into the balloon and a leg bag on extension tubing was attached for drainage.  Patient tolerated well.     Performed by: Carman Ching, PA-C and Milas Kocher, CMA  Additional notes: We discussed that there were no significant findings on his CT urogram to account for his pain episodes; ok to defer cystoscopy at this time.  He has been having a hard time filling his antifungal cream for candidal intertrigo; refilling it for him today.  He had two polypoid regions of granulation tissue emanating from the lateral margins of his SP tract, one on each side. I treated these with silver nitrate; patient consented to treatment and tolerated well. May retreat at next visit if incomplete resolution noted.  Follow up: Return in about 4 weeks (around 09/18/2021) for SPT exchange.

## 2021-09-20 ENCOUNTER — Encounter: Payer: Self-pay | Admitting: Physician Assistant

## 2021-09-20 ENCOUNTER — Ambulatory Visit (INDEPENDENT_AMBULATORY_CARE_PROVIDER_SITE_OTHER): Payer: Medicare Other | Admitting: Physician Assistant

## 2021-09-20 VITALS — Ht 70.0 in | Wt 326.0 lb

## 2021-09-20 DIAGNOSIS — R339 Retention of urine, unspecified: Secondary | ICD-10-CM | POA: Diagnosis not present

## 2021-09-20 DIAGNOSIS — Z435 Encounter for attention to cystostomy: Secondary | ICD-10-CM | POA: Diagnosis not present

## 2021-09-20 NOTE — Progress Notes (Signed)
Suprapubic Cath Change  Patient is present today for a suprapubic catheter change due to urinary retention.  17ml of water was drained from the balloon, a 16FR Silastic foley cath was removed from the tract without difficulty.  Site was cleaned and prepped in a sterile fashion with betadine.  A 16FR Silastic foley cath was replaced into the tract no complications were noted. Urine return was noted, 10 ml of sterile water was inflated into the balloon, extension tubing was added, and a leg bag was attached for drainage.  Patient tolerated well.     Performed by: Carman Ching, PA-C and DeShannon Katrinka Blazing, CMA  Follow up: Return in about 4 weeks (around 10/18/2021) for SPT exchange.

## 2021-10-18 ENCOUNTER — Ambulatory Visit (INDEPENDENT_AMBULATORY_CARE_PROVIDER_SITE_OTHER): Payer: Medicare Other | Admitting: Physician Assistant

## 2021-10-18 DIAGNOSIS — R339 Retention of urine, unspecified: Secondary | ICD-10-CM

## 2021-10-18 DIAGNOSIS — Z435 Encounter for attention to cystostomy: Secondary | ICD-10-CM

## 2021-10-18 NOTE — Progress Notes (Signed)
Suprapubic Cath Change  Patient is present today for a suprapubic catheter change due to urinary retention.  58ml of water was drained from the balloon, a 16FR Silastic foley cath was removed from the tract without difficulty.  Site was cleaned and prepped in a sterile fashion with betadine.  A 16FR Silastic foley cath was replaced into the tract no complications were noted. Urine return was not noted, 10 ml of sterile water was inflated into the balloon and a leg bag on extension tubing was attached for drainage.  Patient tolerated well.     Performed by: Debroah Loop, PA-C and Elizabeth Palau, CMA  Follow up: Return in about 4 weeks (around 11/15/2021) for SPT exchange.

## 2021-11-15 ENCOUNTER — Encounter: Payer: Medicare Other | Admitting: Physician Assistant

## 2021-11-21 ENCOUNTER — Ambulatory Visit (INDEPENDENT_AMBULATORY_CARE_PROVIDER_SITE_OTHER): Payer: Medicare Other | Admitting: Physician Assistant

## 2021-11-21 DIAGNOSIS — R339 Retention of urine, unspecified: Secondary | ICD-10-CM

## 2021-11-21 DIAGNOSIS — Z435 Encounter for attention to cystostomy: Secondary | ICD-10-CM | POA: Diagnosis not present

## 2021-11-21 NOTE — Progress Notes (Signed)
Suprapubic Cath Change  Patient is present today for a suprapubic catheter change due to urinary retention.  27ml of water was drained from the balloon, a 18FR Silastic foley cath was removed from the tract without difficulty.  Site was cleaned and prepped in a sterile fashion with betadine.  A 18FR Silastic foley cath was replaced into the tract no complications were noted. Urine return was not noted and he was counseled to RTC if no drainage within 2 hours, 10 ml of sterile water was inflated into the balloon and a leg bag on extension tubing was attached for drainage.  Patient tolerated well.     Performed by: Debroah Loop, PA-C and Elizabeth Palau, CMA  Follow up: Return in about 4 weeks (around 12/19/2021) for SPT exchange.

## 2021-12-26 ENCOUNTER — Ambulatory Visit (INDEPENDENT_AMBULATORY_CARE_PROVIDER_SITE_OTHER): Payer: Medicare Other | Admitting: Physician Assistant

## 2021-12-26 DIAGNOSIS — Z435 Encounter for attention to cystostomy: Secondary | ICD-10-CM

## 2021-12-26 DIAGNOSIS — R339 Retention of urine, unspecified: Secondary | ICD-10-CM | POA: Diagnosis not present

## 2021-12-26 DIAGNOSIS — L929 Granulomatous disorder of the skin and subcutaneous tissue, unspecified: Secondary | ICD-10-CM | POA: Diagnosis not present

## 2021-12-26 NOTE — Progress Notes (Signed)
Suprapubic Cath Change  Patient is present today for a suprapubic catheter change due to urinary retention.  57ml of water was drained from the balloon, a 18FR Silastic foley cath was removed from the tract without difficulty.  Site was cleaned and prepped in a sterile fashion with betadine.  A 18FR Silastic foley cath was replaced into the tract no complications were noted. Urine return was not noted and he was counseled to RTC if no drainage within 2 hours, 10 ml of sterile water was inflated into the balloon and a leg bag on extension tubing was attached for drainage.  Patient tolerated well.     Performed by: Carman Ching, PA-C and DeShannon Katrinka Blazing, CMA  Additional notes: 2 areas of granulation tissue emanating from the lateral aspects of his SPT tract today. I treated these with silver nitrate cautery; patient tolerated well.  Follow up: Return in about 4 weeks (around 01/23/2022) for SPT exchange.

## 2022-01-23 ENCOUNTER — Ambulatory Visit (INDEPENDENT_AMBULATORY_CARE_PROVIDER_SITE_OTHER): Payer: Medicare Other | Admitting: Physician Assistant

## 2022-01-23 DIAGNOSIS — L929 Granulomatous disorder of the skin and subcutaneous tissue, unspecified: Secondary | ICD-10-CM

## 2022-01-23 DIAGNOSIS — R339 Retention of urine, unspecified: Secondary | ICD-10-CM | POA: Diagnosis not present

## 2022-01-23 DIAGNOSIS — Z435 Encounter for attention to cystostomy: Secondary | ICD-10-CM

## 2022-01-23 NOTE — Progress Notes (Signed)
Suprapubic Cath Change  Patient is present today for a suprapubic catheter change due to urinary retention.  29ml of water was drained from the balloon, a 18FR Silastic foley cath was removed from the tract without difficulty.  Site was cleaned and prepped in a sterile fashion with betadine.  A 18FR Silastic foley cath was replaced into the tract no complications were noted. Urine return was not noted and patient was reminded to RTC if no drainage within 2 hours, 10 ml of sterile water was inflated into the balloon and a leg bag on extension tubing was attached for drainage.  Patient tolerated well.   Performed by: Carman Ching, PA-C and Ples Specter, CMA  Additional notes: 2 areas of previously-treated granulation tissue emanating from the lateral aspects of the SPT tract have diminished in size but remain. I treated them again today with silver nitrate cautery; patient tolerated well.  Follow up: Return in about 4 weeks (around 02/20/2022) for SPT exchange.

## 2022-02-25 ENCOUNTER — Ambulatory Visit (INDEPENDENT_AMBULATORY_CARE_PROVIDER_SITE_OTHER): Payer: Medicare Other | Admitting: Physician Assistant

## 2022-02-25 DIAGNOSIS — Z435 Encounter for attention to cystostomy: Secondary | ICD-10-CM

## 2022-02-25 NOTE — Progress Notes (Signed)
Suprapubic Cath Change  Patient is present today for a suprapubic catheter change due to urinary retention.  84ml of water was drained from the balloon, a 18FR Silastic foley cath was removed from the tract without difficulty.  Site was cleaned and prepped in a sterile fashion with betadine.  A 18FR Silastic foley cath was replaced into the tract no complications were noted. Urine return was not noted, 10 ml of sterile water was inflated into the balloon and a leg bag on extension tubing was attached for drainage.  Patient tolerated well.    Performed by: Debroah Loop, PA-C  and Gaspar Cola, CMA  Additional notes: He reports bladder pain this month and has been self-treating with amoxicillin TID x7 days; he reports symptomatic improvement with this but is having diarrhea.  Will have him start a probiotic and revisit next month 2/2 reports of bladder spasms, may consider pharmacotherapy in the future if persistent.  Follow up: Return in about 4 weeks (around 03/25/2022) for SPT exchange.

## 2022-03-18 ENCOUNTER — Other Ambulatory Visit: Payer: Self-pay | Admitting: Physician Assistant

## 2022-03-18 DIAGNOSIS — B372 Candidiasis of skin and nail: Secondary | ICD-10-CM

## 2022-03-27 ENCOUNTER — Ambulatory Visit (INDEPENDENT_AMBULATORY_CARE_PROVIDER_SITE_OTHER): Payer: Medicare Other | Admitting: Physician Assistant

## 2022-03-27 DIAGNOSIS — Z435 Encounter for attention to cystostomy: Secondary | ICD-10-CM

## 2022-03-27 DIAGNOSIS — R339 Retention of urine, unspecified: Secondary | ICD-10-CM | POA: Diagnosis not present

## 2022-03-27 NOTE — Progress Notes (Signed)
Suprapubic Cath Change  Patient is present today for a suprapubic catheter change due to urinary retention.  67m of water was drained from the balloon, a 16FR foley cath was removed from the tract without difficulty.  Site was cleaned and prepped in a sterile fashion with betadine.  A 16FR foley cath was replaced into the tract no complications were noted. Urine return was not noted, 10 ml of sterile water was inflated into the balloon and a leg bag was attached for drainage.  Patient tolerated well.     Performed by: SDebroah Loop PA-C and Humberta Magallon-Mariche, CMA  Follow up: Return in about 4 weeks (around 04/24/2022) for SPT exchange.

## 2022-04-24 ENCOUNTER — Ambulatory Visit (INDEPENDENT_AMBULATORY_CARE_PROVIDER_SITE_OTHER): Payer: Medicare Other | Admitting: Physician Assistant

## 2022-04-24 VITALS — BP 127/71 | HR 73 | Ht 70.0 in | Wt 337.0 lb

## 2022-04-24 DIAGNOSIS — R339 Retention of urine, unspecified: Secondary | ICD-10-CM

## 2022-04-24 DIAGNOSIS — Z435 Encounter for attention to cystostomy: Secondary | ICD-10-CM | POA: Diagnosis not present

## 2022-04-24 NOTE — Progress Notes (Signed)
Suprapubic Cath Change  Patient is present today for a suprapubic catheter change due to urinary retention.  77ml of water was drained from the balloon, a 18FR Silastic foley cath was removed from the tract without difficulty.  Site was cleaned and prepped in a sterile fashion with betadine.  A 18FR Silastic foley cath was replaced into the tract no complications were noted. Urine return was not noted, 10 ml of sterile water was inflated into the balloon and a leg bag was attached for drainage.  Patient tolerated well.     Performed by: Debroah Loop, PA-C   Follow up: Return in about 4 weeks (around 05/22/2022) for SPT exchange.

## 2022-05-22 ENCOUNTER — Ambulatory Visit (INDEPENDENT_AMBULATORY_CARE_PROVIDER_SITE_OTHER): Payer: Medicare Other | Admitting: Physician Assistant

## 2022-05-22 DIAGNOSIS — Z435 Encounter for attention to cystostomy: Secondary | ICD-10-CM | POA: Diagnosis not present

## 2022-05-22 DIAGNOSIS — L929 Granulomatous disorder of the skin and subcutaneous tissue, unspecified: Secondary | ICD-10-CM | POA: Diagnosis not present

## 2022-05-22 DIAGNOSIS — R339 Retention of urine, unspecified: Secondary | ICD-10-CM

## 2022-05-22 NOTE — Progress Notes (Signed)
Suprapubic Cath Change  Patient is present today for a suprapubic catheter change due to urinary retention.  8ml of water was drained from the balloon, a 18FR Silastic foley cath was removed from the tract without difficulty.  Site was cleaned and prepped in a sterile fashion with betadine.  A 18FR Silastic foley cath was replaced into the tract no complications were noted. Urine return was not noted, 10 ml of sterile water was inflated into the balloon and a leg bag was attached for drainage.  Patient tolerated well.     Performed by: Carman Ching, PA-C and Mammie Lorenzo, RN  Additional notes: Patient had two areas of granulation tissue emanating from the lateral borders of his SP tract. He consented to silver nitrate cautery of these today. I prepped the patient with betadine and subsequently treated both areas with silver nitrate until the tissue appeared dusky throughout. Surround tissue remained uninvolved. Patient tolerated well.  Follow up: Return in about 4 weeks (around 06/19/2022) for SPT exchange.

## 2022-06-20 ENCOUNTER — Ambulatory Visit (INDEPENDENT_AMBULATORY_CARE_PROVIDER_SITE_OTHER): Payer: Medicare Other | Admitting: Physician Assistant

## 2022-06-20 VITALS — BP 143/79 | HR 73 | Ht 70.0 in | Wt 337.0 lb

## 2022-06-20 DIAGNOSIS — R339 Retention of urine, unspecified: Secondary | ICD-10-CM | POA: Diagnosis not present

## 2022-06-20 DIAGNOSIS — Z435 Encounter for attention to cystostomy: Secondary | ICD-10-CM | POA: Diagnosis not present

## 2022-06-20 NOTE — Progress Notes (Signed)
Suprapubic Cath Change  Patient is present today for a suprapubic catheter change due to urinary retention.  8ml of water was drained from the balloon, a 18FR Silastic foley cath was removed from the tract without difficulty.  Site was cleaned and prepped in a sterile fashion with betadine.  A 18FR Silastic foley cath on extension tubing was replaced into the tract no complications were noted. Urine return was not noted and patient was counseled to RTC if no drainage within 2 hours, 10 ml of sterile water was inflated into the balloon and a leg bag was attached for drainage.  Patient tolerated well.    Performed by: Carman Ching, PA-C and Humberta Magallon-Mariche, CMA  Follow up: Return in about 4 weeks (around 07/18/2022) for SPT exchange.

## 2022-07-14 ENCOUNTER — Other Ambulatory Visit: Payer: Self-pay | Admitting: Physician Assistant

## 2022-07-14 DIAGNOSIS — B372 Candidiasis of skin and nail: Secondary | ICD-10-CM

## 2022-07-21 ENCOUNTER — Ambulatory Visit (INDEPENDENT_AMBULATORY_CARE_PROVIDER_SITE_OTHER): Payer: Medicare Other | Admitting: Physician Assistant

## 2022-07-21 ENCOUNTER — Ambulatory Visit: Payer: Medicare Other | Admitting: Physician Assistant

## 2022-07-21 DIAGNOSIS — Z435 Encounter for attention to cystostomy: Secondary | ICD-10-CM

## 2022-07-21 DIAGNOSIS — R339 Retention of urine, unspecified: Secondary | ICD-10-CM | POA: Diagnosis not present

## 2022-07-21 NOTE — Progress Notes (Signed)
Suprapubic Cath Change  Patient is present today for a suprapubic catheter change due to urinary retention.  8ml of water was drained from the balloon, a 18FR Silastic foley cath was removed from the tract with out difficulty.  Site was cleaned and prepped in a sterile fashion with betadine.  A 18FR Silastic foley cath was replaced into the tract no complications were noted. Urine return was not noted, 10 ml of sterile water was inflated into the balloon and a leg bag on extension tubing was attached for drainage.  Patient tolerated well.   Performed by: Carman Ching, PA-C and Debbe Bales, CMA  Follow up: Return in about 4 weeks (around 08/18/2022) for SPT exchange.

## 2022-08-22 ENCOUNTER — Ambulatory Visit: Payer: Medicare Other | Admitting: Physician Assistant

## 2022-08-22 DIAGNOSIS — R339 Retention of urine, unspecified: Secondary | ICD-10-CM

## 2022-08-22 DIAGNOSIS — Z435 Encounter for attention to cystostomy: Secondary | ICD-10-CM

## 2022-08-22 NOTE — Progress Notes (Signed)
Suprapubic Cath Change  Patient is present today for a suprapubic catheter change due to urinary retention.  8ml of water was drained from the balloon, a 18FR Silastic foley cath was removed from the tract without difficulty.  Site was cleaned and prepped in a sterile fashion with betadine.  A 18FR Silastic foley cath was replaced into the tract no complications were noted. Urine return was noted, 10 ml of sterile water was inflated into the balloon and a leg bag on extension tubing was attached for drainage.  Patient tolerated well.     Performed by: Carman Ching, PA-C and Humberta Magallon-Mariche, CMA  Follow up: Return in about 4 weeks (around 09/19/2022) for SPT exchange.

## 2022-09-19 ENCOUNTER — Ambulatory Visit: Payer: Medicare Other | Admitting: Physician Assistant

## 2022-09-19 ENCOUNTER — Encounter: Payer: Self-pay | Admitting: Physician Assistant

## 2022-09-19 VITALS — BP 150/65 | HR 77 | Ht 70.0 in | Wt 337.0 lb

## 2022-09-19 DIAGNOSIS — Z435 Encounter for attention to cystostomy: Secondary | ICD-10-CM

## 2022-09-19 DIAGNOSIS — R339 Retention of urine, unspecified: Secondary | ICD-10-CM

## 2022-09-19 NOTE — Progress Notes (Signed)
Suprapubic Cath Change  Patient is present today for a suprapubic catheter change due to urinary retention.  8ml of water was drained from the balloon, a 18FR Silastic foley cath was removed from the tract without difficulty.  Site was cleaned and prepped in a sterile fashion with betadine.  A 18FR Silastic foley cath was replaced into the tract no complications were noted. Urine return was not noted, 10 ml of sterile water was inflated into the balloon and a leg bag on extension tubing was attached for drainage.  Patient tolerated well.   Performed by: Carman Ching, PA-C and [place] Pink, RMA  Follow up: Return in about 4 weeks (around 10/17/2022) for SPT exchange.

## 2022-10-27 ENCOUNTER — Ambulatory Visit: Payer: Medicare Other | Admitting: Physician Assistant

## 2022-10-28 ENCOUNTER — Ambulatory Visit (INDEPENDENT_AMBULATORY_CARE_PROVIDER_SITE_OTHER): Payer: Medicare Other | Admitting: Physician Assistant

## 2022-10-28 DIAGNOSIS — R339 Retention of urine, unspecified: Secondary | ICD-10-CM

## 2022-10-28 DIAGNOSIS — Z435 Encounter for attention to cystostomy: Secondary | ICD-10-CM

## 2022-10-28 NOTE — Progress Notes (Signed)
Suprapubic Cath Change  Patient is present today for a suprapubic catheter change due to urinary retention.  8ml of water was drained from the balloon, a 18FR Silastic foley cath was removed from the tract without difficulty.  Site was cleaned and prepped in a sterile fashion with betadine.  A 18FR Silastic foley cath was replaced into the tract no complications were noted. Urine return was noted, 10 ml of sterile water was inflated into the balloon and a leg bag on extension tubing was attached for drainage.  Patient tolerated well.     Performed by: Carman Ching, PA-C and Humberta Magallon-Mariche, CMA  Follow up: Return in about 4 weeks (around 11/25/2022) for SPT exchange.

## 2022-11-28 ENCOUNTER — Ambulatory Visit (INDEPENDENT_AMBULATORY_CARE_PROVIDER_SITE_OTHER): Payer: Medicare Other | Admitting: Physician Assistant

## 2022-11-28 DIAGNOSIS — Z435 Encounter for attention to cystostomy: Secondary | ICD-10-CM

## 2022-11-28 DIAGNOSIS — R339 Retention of urine, unspecified: Secondary | ICD-10-CM

## 2022-11-28 NOTE — Progress Notes (Signed)
Suprapubic Cath Change  Patient is present today for a suprapubic catheter change due to urinary retention.  8ml of water was drained from the balloon, a 18FR Silastic foley cath was removed from the tract without difficulty.  Site was cleaned and prepped in a sterile fashion with betadine.  A 18FR Silastic foley cath was replaced into the tract no complications were noted. Urine return was noted, 10 ml of sterile water was inflated into the balloon and a leg bag on extension tubing was attached for drainage.  Patient tolerated well.    Performed by: Carman Ching, PA-C   Follow up: Return in about 4 weeks (around 12/26/2022) for SPT exchange.

## 2023-01-02 ENCOUNTER — Encounter: Payer: Self-pay | Admitting: Physician Assistant

## 2023-01-02 ENCOUNTER — Ambulatory Visit (INDEPENDENT_AMBULATORY_CARE_PROVIDER_SITE_OTHER): Payer: Medicare Other | Admitting: Physician Assistant

## 2023-01-02 VITALS — Ht 70.0 in | Wt 337.0 lb

## 2023-01-02 DIAGNOSIS — R339 Retention of urine, unspecified: Secondary | ICD-10-CM | POA: Diagnosis not present

## 2023-01-02 DIAGNOSIS — Z435 Encounter for attention to cystostomy: Secondary | ICD-10-CM

## 2023-01-02 NOTE — Progress Notes (Signed)
Suprapubic Cath Change  Patient is present today for a suprapubic catheter change due to urinary retention.  8ml of water was drained from the balloon, a 18FR Silastic foley cath was removed from the tract without difficulty.  Site was cleaned and prepped in a sterile fashion with betadine.  A 18FR Silastic foley cath was replaced into the tract no complications were noted. Urine return was not noted, 10 ml of sterile water was inflated into the balloon and a leg bag on extension tubing was attached for drainage.  Patient tolerated well.  Performed by: Carman Ching, PA-C   Follow up: Return in about 4 weeks (around 01/30/2023) for SPT exchange.

## 2023-02-02 ENCOUNTER — Ambulatory Visit (INDEPENDENT_AMBULATORY_CARE_PROVIDER_SITE_OTHER): Payer: Medicare Other | Admitting: Physician Assistant

## 2023-02-02 DIAGNOSIS — R339 Retention of urine, unspecified: Secondary | ICD-10-CM

## 2023-02-02 DIAGNOSIS — Z435 Encounter for attention to cystostomy: Secondary | ICD-10-CM

## 2023-02-02 NOTE — Progress Notes (Signed)
 Suprapubic Cath Change  Patient is present today for a suprapubic catheter change due to urinary retention.  8ml of water was drained from the balloon, a 18FR Silastic foley cath was removed from the tract without difficulty.  Site was cleaned and prepped in a sterile fashion with betadine.  A 18FR Silastic foley cath was replaced into the tract no complications were noted. Urine return was not noted, 10 ml of sterile water was inflated into the balloon and a leg bag on extension tubing was attached for drainage.  Patient tolerated well.    Performed by: Shearon Clonch, PA-C and Anastasiya Hopkins, CMA  Follow up: Return in about 4 weeks (around 03/02/2023) for SPT exchange.

## 2023-03-02 ENCOUNTER — Encounter: Payer: Self-pay | Admitting: Physician Assistant

## 2023-03-02 ENCOUNTER — Ambulatory Visit: Payer: Medicare Other | Admitting: Physician Assistant

## 2023-03-02 VITALS — Ht 69.0 in | Wt 334.0 lb

## 2023-03-02 DIAGNOSIS — Z435 Encounter for attention to cystostomy: Secondary | ICD-10-CM

## 2023-03-02 DIAGNOSIS — R339 Retention of urine, unspecified: Secondary | ICD-10-CM

## 2023-03-02 NOTE — Progress Notes (Signed)
Suprapubic Cath Change  Patient is present today for a suprapubic catheter change due to urinary retention.  8ml of water was drained from the balloon, a 18FR Silastic foley cath was removed from the tract without difficulty.  Site was cleaned and prepped in a sterile fashion with betadine.  A 18FR Silastic foley cath was replaced into the tract no complications were noted. Urine return was not noted, 10 ml of sterile water was inflated into the balloon and a leg bag on extension tubing was attached for drainage.  Patient tolerated well.     Performed by: Carman Ching, PA-C   Follow up: Return in about 4 weeks (around 03/30/2023) for SPT exchange.

## 2023-03-30 ENCOUNTER — Ambulatory Visit (INDEPENDENT_AMBULATORY_CARE_PROVIDER_SITE_OTHER): Payer: Medicare Other | Admitting: Physician Assistant

## 2023-03-30 VITALS — Wt 334.0 lb

## 2023-03-30 DIAGNOSIS — Z435 Encounter for attention to cystostomy: Secondary | ICD-10-CM | POA: Diagnosis not present

## 2023-03-30 NOTE — Progress Notes (Signed)
 Suprapubic Cath Change  Patient is present today for a suprapubic catheter change due to urinary retention.  8ml of water was drained from the balloon, a 18FR Silastic foley cath was removed from the tract with out difficulty.  Site was cleaned and prepped in a sterile fashion with betadine.  A 18FR Silastic foley cath was replaced into the tract no complications were noted. Urine return was noted, 10 ml of sterile water was inflated into the balloon and a leg bag on extension tubing was attached for drainage.  Patient tolerated well.   Performed by: Carman Ching, PA-C and Darrol Angel, CMA  Follow up: Return in about 4 weeks (around 04/27/2023) for SPT exchange.

## 2023-04-27 ENCOUNTER — Ambulatory Visit: Admitting: Physician Assistant

## 2023-04-29 ENCOUNTER — Ambulatory Visit (INDEPENDENT_AMBULATORY_CARE_PROVIDER_SITE_OTHER): Admitting: Physician Assistant

## 2023-04-29 VITALS — Ht 69.0 in | Wt 320.0 lb

## 2023-04-29 DIAGNOSIS — Z435 Encounter for attention to cystostomy: Secondary | ICD-10-CM | POA: Diagnosis not present

## 2023-04-29 DIAGNOSIS — L929 Granulomatous disorder of the skin and subcutaneous tissue, unspecified: Secondary | ICD-10-CM

## 2023-04-29 DIAGNOSIS — B372 Candidiasis of skin and nail: Secondary | ICD-10-CM

## 2023-04-29 MED ORDER — KETOCONAZOLE 2 % EX CREA: TOPICAL_CREAM | CUTANEOUS | 2 refills | Status: AC

## 2023-04-29 NOTE — Progress Notes (Signed)
 Suprapubic Cath Change  Patient is present today for a suprapubic catheter change due to urinary retention.  8ml of water was drained from the balloon, a 18FR Silastic foley cath was removed from the tract without difficulty.  Site was cleaned and prepped in a sterile fashion with betadine.  A 18FR Silastic foley cath was replaced into the tract no complications were noted. Urine return was not noted, 10 ml of sterile water was inflated into the balloon and a leg bag on extension tubing was attached for drainage.  Patient tolerated well.  Performed by: Carman Ching, PA-C and Benay Pike, CMA  Additional notes: Nitrate cautery applied to granulation tissue at the right later SP tract. Refilling antifungal cream per pt request.  Follow up: Return in about 4 weeks (around 05/27/2023) for SPT exchange.

## 2023-05-27 ENCOUNTER — Ambulatory Visit: Admitting: Physician Assistant

## 2023-05-29 ENCOUNTER — Ambulatory Visit (INDEPENDENT_AMBULATORY_CARE_PROVIDER_SITE_OTHER): Admitting: Physician Assistant

## 2023-05-29 DIAGNOSIS — Z435 Encounter for attention to cystostomy: Secondary | ICD-10-CM | POA: Diagnosis not present

## 2023-05-29 NOTE — Progress Notes (Signed)
 Suprapubic Cath Change  Patient is present today for a suprapubic catheter change due to urinary retention.  8ml of water was drained from the balloon, a 18FR Silastic foley cath was removed from the tract without difficulty.  Site was cleaned and prepped in a sterile fashion with betadine.  A 18FR Silastic foley cath was replaced into the tract no complications were noted. Urine return was noted, 10 ml of sterile water was inflated into the balloon and a leg bag on extension tubing was attached for drainage.  Patient tolerated well.   Performed by: Yina Riviere, PA-C and Humberta Magallon-Mariche, CMA  Follow up: Return in about 4 weeks (around 06/26/2023) for SPT exchange.

## 2023-06-25 ENCOUNTER — Ambulatory Visit (INDEPENDENT_AMBULATORY_CARE_PROVIDER_SITE_OTHER): Admitting: Physician Assistant

## 2023-06-25 DIAGNOSIS — Z435 Encounter for attention to cystostomy: Secondary | ICD-10-CM | POA: Diagnosis not present

## 2023-06-25 NOTE — Progress Notes (Signed)
 Suprapubic Cath Change  Patient is present today for a suprapubic catheter change due to urinary retention.  8ml of water was drained from the balloon, a 18FR Silastic foley cath was removed from the tract with out difficulty.  Site was cleaned and prepped in a sterile fashion with betadine.  A 18FR Silastic foley cath was replaced into the tract no complications were noted. Urine return was not noted, 10 ml of sterile water was inflated into the balloon and a leg bag on extension tubing was attached for drainage.  Patient tolerated well.    Performed by: Mylissa Lambe, PA-C and Jenny Mohs, CMA  Follow up: Return in about 4 weeks (around 07/23/2023) for SPT exchange.

## 2023-06-26 ENCOUNTER — Ambulatory Visit: Admitting: Physician Assistant

## 2023-07-23 ENCOUNTER — Ambulatory Visit (INDEPENDENT_AMBULATORY_CARE_PROVIDER_SITE_OTHER): Admitting: Physician Assistant

## 2023-07-23 DIAGNOSIS — Z435 Encounter for attention to cystostomy: Secondary | ICD-10-CM

## 2023-07-23 NOTE — Progress Notes (Signed)
 Suprapubic Cath Change  Patient is present today for a suprapubic catheter change due to urinary retention.  8ml of water was drained from the balloon, a 18FR Silastic foley cath was removed from the tract with out difficulty.  Site was cleaned and prepped in a sterile fashion with betadine.  A 18FR Silastic foley cath was replaced into the tract no complications were noted. Urine return was not noted, 10 ml of sterile water was inflated into the balloon and a leg bag on extension tubing was attached for drainage.  Patient tolerated well.  Performed by: Jayden Rudge, PA-C   Follow up: Return in about 4 weeks (around 08/20/2023) for SPT exchange.

## 2023-08-20 ENCOUNTER — Ambulatory Visit (INDEPENDENT_AMBULATORY_CARE_PROVIDER_SITE_OTHER): Admitting: Physician Assistant

## 2023-08-20 DIAGNOSIS — Z435 Encounter for attention to cystostomy: Secondary | ICD-10-CM | POA: Diagnosis not present

## 2023-08-20 NOTE — Progress Notes (Signed)
 Suprapubic Cath Change  Patient is present today for a suprapubic catheter change due to urinary retention.  8ml of water was drained from the balloon, a 18FR Silastic foley cath was removed from the tract without difficulty.  Site was cleaned and prepped in a sterile fashion with betadine.  A 18FR Silastic foley cath was replaced into the tract no complications were noted. Urine return was noted, 10 ml of sterile water was inflated into the balloon and a leg bag on extension tubing was attached for drainage.  Patient tolerated well.  Performed by: Rayonna Heldman, PA-C and Mathew Pinal, RN  Follow up: Return in about 4 weeks (around 09/17/2023) for SPT exchange.

## 2023-09-04 ENCOUNTER — Ambulatory Visit (INDEPENDENT_AMBULATORY_CARE_PROVIDER_SITE_OTHER): Admitting: Physician Assistant

## 2023-09-04 ENCOUNTER — Telehealth: Payer: Self-pay

## 2023-09-04 DIAGNOSIS — Z435 Encounter for attention to cystostomy: Secondary | ICD-10-CM | POA: Diagnosis not present

## 2023-09-04 NOTE — Progress Notes (Signed)
 Patient presented today as same-day add on due to nondraining SPT x1 day. He denies pain or fevers.  Suprapubic Cath Change  Patient is present today for a suprapubic catheter change due to urinary retention.  8ml of water was drained from the balloon, a 18FR Silastic foley cath was removed from the tract with out difficulty.  Site was cleaned and prepped in a sterile fashion with betadine.  A 18FR foley cath was replaced into the tract no complications were noted. Urine return was noted, 10 ml of sterile water was inflated into the balloon and a leg bag on extension tubing was attached for drainage.  Patient tolerated well.   Performed by: Marquis Diles, PA-C and Charisse Blackwell, CMA  Follow up: Return in about 4 weeks (around 10/02/2023) for SPT exchange.

## 2023-09-04 NOTE — Telephone Encounter (Signed)
 Patient left message on 8/8 later evening. I returned his call and he states his SP tube is blocked. I talked with Sam Pa and she is going to walk him in today to take care of the issue.

## 2023-09-17 ENCOUNTER — Ambulatory Visit: Admitting: Physician Assistant

## 2023-09-22 ENCOUNTER — Ambulatory Visit: Admitting: Internal Medicine

## 2023-09-23 ENCOUNTER — Encounter: Attending: Internal Medicine | Admitting: Internal Medicine

## 2023-09-23 DIAGNOSIS — I87331 Chronic venous hypertension (idiopathic) with ulcer and inflammation of right lower extremity: Secondary | ICD-10-CM | POA: Diagnosis present

## 2023-09-23 DIAGNOSIS — L97318 Non-pressure chronic ulcer of right ankle with other specified severity: Secondary | ICD-10-CM | POA: Insufficient documentation

## 2023-10-01 ENCOUNTER — Encounter: Attending: Physician Assistant

## 2023-10-01 DIAGNOSIS — L97318 Non-pressure chronic ulcer of right ankle with other specified severity: Secondary | ICD-10-CM | POA: Diagnosis present

## 2023-10-01 DIAGNOSIS — I890 Lymphedema, not elsewhere classified: Secondary | ICD-10-CM | POA: Diagnosis not present

## 2023-10-01 DIAGNOSIS — I87331 Chronic venous hypertension (idiopathic) with ulcer and inflammation of right lower extremity: Secondary | ICD-10-CM | POA: Diagnosis not present

## 2023-10-05 ENCOUNTER — Ambulatory Visit (INDEPENDENT_AMBULATORY_CARE_PROVIDER_SITE_OTHER): Admitting: Physician Assistant

## 2023-10-05 ENCOUNTER — Other Ambulatory Visit: Payer: Self-pay

## 2023-10-05 ENCOUNTER — Emergency Department

## 2023-10-05 ENCOUNTER — Inpatient Hospital Stay
Admission: EM | Admit: 2023-10-05 | Discharge: 2023-10-07 | DRG: 698 | Disposition: A | Discharge status: Home Health | Attending: Internal Medicine | Admitting: Internal Medicine

## 2023-10-05 DIAGNOSIS — Z79899 Other long term (current) drug therapy: Secondary | ICD-10-CM

## 2023-10-05 DIAGNOSIS — G8929 Other chronic pain: Secondary | ICD-10-CM | POA: Diagnosis present

## 2023-10-05 DIAGNOSIS — N400 Enlarged prostate without lower urinary tract symptoms: Secondary | ICD-10-CM | POA: Insufficient documentation

## 2023-10-05 DIAGNOSIS — Z9884 Bariatric surgery status: Secondary | ICD-10-CM

## 2023-10-05 DIAGNOSIS — I10 Essential (primary) hypertension: Secondary | ICD-10-CM | POA: Diagnosis present

## 2023-10-05 DIAGNOSIS — T83518A Infection and inflammatory reaction due to other urinary catheter, initial encounter: Principal | ICD-10-CM | POA: Diagnosis present

## 2023-10-05 DIAGNOSIS — N390 Urinary tract infection, site not specified: Principal | ICD-10-CM | POA: Diagnosis present

## 2023-10-05 DIAGNOSIS — Z9049 Acquired absence of other specified parts of digestive tract: Secondary | ICD-10-CM

## 2023-10-05 DIAGNOSIS — Y846 Urinary catheterization as the cause of abnormal reaction of the patient, or of later complication, without mention of misadventure at the time of the procedure: Secondary | ICD-10-CM | POA: Diagnosis present

## 2023-10-05 DIAGNOSIS — I872 Venous insufficiency (chronic) (peripheral): Secondary | ICD-10-CM | POA: Diagnosis present

## 2023-10-05 DIAGNOSIS — Z435 Encounter for attention to cystostomy: Secondary | ICD-10-CM

## 2023-10-05 DIAGNOSIS — N401 Enlarged prostate with lower urinary tract symptoms: Secondary | ICD-10-CM | POA: Diagnosis present

## 2023-10-05 DIAGNOSIS — K219 Gastro-esophageal reflux disease without esophagitis: Secondary | ICD-10-CM | POA: Diagnosis present

## 2023-10-05 DIAGNOSIS — Z87891 Personal history of nicotine dependence: Secondary | ICD-10-CM

## 2023-10-05 DIAGNOSIS — Z96641 Presence of right artificial hip joint: Secondary | ICD-10-CM | POA: Diagnosis present

## 2023-10-05 DIAGNOSIS — N179 Acute kidney failure, unspecified: Secondary | ICD-10-CM | POA: Diagnosis present

## 2023-10-05 DIAGNOSIS — A4150 Gram-negative sepsis, unspecified: Secondary | ICD-10-CM | POA: Diagnosis present

## 2023-10-05 DIAGNOSIS — R531 Weakness: Secondary | ICD-10-CM | POA: Diagnosis not present

## 2023-10-05 DIAGNOSIS — G4733 Obstructive sleep apnea (adult) (pediatric): Secondary | ICD-10-CM | POA: Diagnosis present

## 2023-10-05 DIAGNOSIS — Z886 Allergy status to analgesic agent status: Secondary | ICD-10-CM

## 2023-10-05 DIAGNOSIS — Z1152 Encounter for screening for COVID-19: Secondary | ICD-10-CM

## 2023-10-05 DIAGNOSIS — L03115 Cellulitis of right lower limb: Secondary | ICD-10-CM | POA: Diagnosis present

## 2023-10-05 DIAGNOSIS — Z833 Family history of diabetes mellitus: Secondary | ICD-10-CM

## 2023-10-05 LAB — URINALYSIS, ROUTINE W REFLEX MICROSCOPIC
Bilirubin Urine: NEGATIVE
Glucose, UA: NEGATIVE mg/dL
Ketones, ur: NEGATIVE mg/dL
Nitrite: NEGATIVE
Protein, ur: NEGATIVE mg/dL
Specific Gravity, Urine: 1.017 (ref 1.005–1.030)
WBC, UA: >50 WBC/hpf (ref 0–5)
pH: 5 (ref 5.0–8.0)

## 2023-10-05 LAB — COMPREHENSIVE METABOLIC PANEL WITH GFR
ALT: 11 U/L (ref 0–44)
AST: 25 U/L (ref 15–41)
Albumin: 3.4 g/dL — ABNORMAL LOW (ref 3.5–5.0)
Alkaline Phosphatase: 70 U/L (ref 38–126)
Anion gap: 10 (ref 5–15)
BUN: 31 mg/dL — ABNORMAL HIGH (ref 8–23)
CO2: 24 mmol/L (ref 22–32)
Calcium: 8.3 mg/dL — ABNORMAL LOW (ref 8.9–10.3)
Chloride: 102 mmol/L (ref 98–111)
Creatinine, Ser: 1.32 mg/dL — ABNORMAL HIGH (ref 0.61–1.24)
GFR, Estimated: 56 mL/min — ABNORMAL LOW (ref >60)
Glucose, Bld: 108 mg/dL — ABNORMAL HIGH (ref 70–99)
Potassium: 4.1 mmol/L (ref 3.5–5.1)
Sodium: 136 mmol/L (ref 135–145)
Total Bilirubin: 0.7 mg/dL (ref 0.0–1.2)
Total Protein: 7.7 g/dL (ref 6.5–8.1)

## 2023-10-05 LAB — CBC
HCT: 33 % — ABNORMAL LOW (ref 39.0–52.0)
Hemoglobin: 10.5 g/dL — ABNORMAL LOW (ref 13.0–17.0)
MCH: 31.3 pg (ref 26.0–34.0)
MCHC: 31.8 g/dL (ref 30.0–36.0)
MCV: 98.2 fL (ref 80.0–100.0)
Platelets: 197 K/uL (ref 150–400)
RBC: 3.36 MIL/uL — ABNORMAL LOW (ref 4.22–5.81)
RDW: 12.7 % (ref 11.5–15.5)
WBC: 12.2 K/uL — ABNORMAL HIGH (ref 4.0–10.5)
nRBC: 0 % (ref 0.0–0.2)

## 2023-10-05 LAB — LACTIC ACID, PLASMA: Lactic Acid, Venous: 1.1 mmol/L (ref 0.5–1.9)

## 2023-10-05 LAB — RESP PANEL BY RT-PCR (RSV, FLU A&B, COVID)  RVPGX2
Influenza A by PCR: NEGATIVE
Influenza B by PCR: NEGATIVE
Resp Syncytial Virus by PCR: NEGATIVE
SARS Coronavirus 2 by RT PCR: NEGATIVE

## 2023-10-05 MED ORDER — SODIUM CHLORIDE 0.9 % IV SOLN
1.0000 g | Freq: Once | INTRAVENOUS | Status: AC
  Administered 2023-10-05: 1 g via INTRAVENOUS
  Filled 2023-10-05: qty 10

## 2023-10-05 NOTE — ED Triage Notes (Signed)
 Pt comes via EMS from home with c/o generalized weakness that started hour ago. Pt walks with crutches. Pt unable to get up on own this time. Pt has open wounds on legs and does get them treated once a week.   91 113/44 98.0 temp CBG 135 96% RA  Pt is A*OX4

## 2023-10-05 NOTE — ED Provider Notes (Signed)
 [facility] Provider Note    Event Date/Time   First MD Initiated Contact with Patient 10/05/23 2011     (approximate)   History   Weakness   HPI  [name] is a 78 y.o. male presents to the emergency department today because of concerns for weakness.  Started today.  It started fairly suddenly.  He all of a sudden felt like he could not walk.  Had been out earlier in the day getting his suprapubic catheter exchange.  The patient denies any fevers although did feel some chills.  Denies any change in urine output recently.  Denies any chest pain cough or shortness of breath.     Physical Exam   Triage Vital Signs: ED Triage Vitals  Encounter Vitals Group     BP 10/05/23 1828 (!) 113/31     Girls Systolic BP Percentile --      Girls Diastolic BP Percentile --      Boys Systolic BP Percentile --      Boys Diastolic BP Percentile --      Pulse Rate 10/05/23 1828 76     Resp --      Temp 10/05/23 1828 (!) 100.8 F (38.2 C)     Temp Source 10/05/23 1828 Oral     SpO2 10/05/23 1828 94 %     Weight --      Height --      Head Circumference --      Peak Flow --      Pain Score 10/05/23 1802 3     Pain Loc --      Pain Education --      Exclude from Growth Chart --     Most recent vital signs: Vitals:   10/05/23 1828  BP: (!) 113/31  Pulse: 76  Temp: (!) 100.8 F (38.2 C)  SpO2: 94%   General: Awake, alert, oriented. CV:  Good peripheral perfusion. Regular rate and rhythm. Resp:  Normal effort. Lungs clear. Abd:  No distention.  Other:  Left leg with wound to lower leg, no surrounding erythema, no bad odor.    ED Results / Procedures / Treatments   Labs (all labs ordered are listed, but only abnormal results are displayed) Labs Reviewed  CBC - Abnormal; Notable for the following components:      Result Value   WBC 12.2 (*)    RBC 3.36 (*)    Hemoglobin 10.5 (*)    HCT 33.0 (*)    All other components within normal limits   COMPREHENSIVE METABOLIC PANEL WITH GFR - Abnormal; Notable for the following components:   Glucose, Bld 108 (*)    BUN 31 (*)    Creatinine, Ser 1.32 (*)    Calcium 8.3 (*)    Albumin 3.4 (*)    GFR, Estimated 56 (*)    All other components within normal limits  URINALYSIS, ROUTINE W REFLEX MICROSCOPIC - Abnormal; Notable for the following components:   Color, Urine YELLOW (*)    APPearance CLOUDY (*)    Hgb urine dipstick MODERATE (*)    Leukocytes,Ua LARGE (*)    Bacteria, UA MANY (*)    All other components within normal limits     EKG  I, Guadalupe Eagles, attending physician, personally viewed and interpreted this EKG  EKG Time: 1816 Rate: 76 Rhythm: sinus rhythm with pacs Axis: left axis deviation Intervals: qtc 414 QRS: narrow, q waves III, aVF ST changes: no st  elevation Impression: abnormal ekg  RADIOLOGY I independently interpreted and visualized the CXR. My interpretation: No pneumonia Radiology interpretation:  IMPRESSION:  Cardiomegaly with vascular congestion and mild diffuse interstitial  opacity, probable mild edema. Slightly globular cardiac  configuration, cannot exclude pericardial effusion.      PROCEDURES:  Critical Care performed: No   MEDICATIONS ORDERED IN ED: Medications - No data to display   IMPRESSION / MDM / ASSESSMENT AND PLAN / ED COURSE  I reviewed the triage vital signs and the nursing notes.                              Differential diagnosis includes, but is not limited to, UTI, pneumonia, skin infection  Patient's presentation is most consistent with acute presentation with potential threat to life or bodily function.   Patient presented to the emergency department today because of concerns for weakness and difficulty with ambulation.  Patient was noted to be febrile upon arrival to the emergency department.  Blood work was notable for leukocytosis.  Lactic acid level was normal.  Patient's urine is concerning for  infection.  No other obvious source of infection found.  Patient was given antibiotics.  Will plan on admission.   FINAL CLINICAL IMPRESSION(S) / ED DIAGNOSES   Final diagnoses:  Lower urinary tract infectious disease      Note:  This document was prepared using Dragon voice recognition software and may include unintentional dictation errors.    Floy Roberts, MD 10/06/23 [phone]

## 2023-10-05 NOTE — Progress Notes (Signed)
 Suprapubic Cath Change  Patient is present today for a suprapubic catheter change due to urinary retention.  8ml of water was drained from the balloon, a 18FR foley cath was removed from the tract with out difficulty.  Site was cleaned and prepped in a sterile fashion with betadine.  A 18FR Silastic foley cath was replaced into the tract no complications were noted. Urine return was not noted, 10 ml of sterile water was inflated into the balloon and a leg bag on extension tubing was attached for drainage.  Patient tolerated well.  Performed by: Avnoor Koury, PA-C  Additional notes: He reported several days of nondraining SPT this month with high volume leakage per urethra. This has resolved. If recurrent, will consider imaging. Question possible bladder stone vs catheter dislodgment.  Follow up: Return in about 4 weeks (around 11/02/2023) for SPT exchange.

## 2023-10-06 DIAGNOSIS — K219 Gastro-esophageal reflux disease without esophagitis: Secondary | ICD-10-CM

## 2023-10-06 DIAGNOSIS — N400 Enlarged prostate without lower urinary tract symptoms: Secondary | ICD-10-CM | POA: Insufficient documentation

## 2023-10-06 DIAGNOSIS — A4150 Gram-negative sepsis, unspecified: Secondary | ICD-10-CM | POA: Diagnosis present

## 2023-10-06 DIAGNOSIS — I10 Essential (primary) hypertension: Secondary | ICD-10-CM | POA: Diagnosis present

## 2023-10-06 DIAGNOSIS — L03115 Cellulitis of right lower limb: Secondary | ICD-10-CM | POA: Diagnosis present

## 2023-10-06 DIAGNOSIS — Y846 Urinary catheterization as the cause of abnormal reaction of the patient, or of later complication, without mention of misadventure at the time of the procedure: Secondary | ICD-10-CM | POA: Diagnosis present

## 2023-10-06 DIAGNOSIS — R531 Weakness: Secondary | ICD-10-CM | POA: Diagnosis present

## 2023-10-06 DIAGNOSIS — T83518A Infection and inflammatory reaction due to other urinary catheter, initial encounter: Secondary | ICD-10-CM | POA: Diagnosis present

## 2023-10-06 DIAGNOSIS — Z87891 Personal history of nicotine dependence: Secondary | ICD-10-CM | POA: Diagnosis not present

## 2023-10-06 DIAGNOSIS — Z79899 Other long term (current) drug therapy: Secondary | ICD-10-CM | POA: Diagnosis not present

## 2023-10-06 DIAGNOSIS — N401 Enlarged prostate with lower urinary tract symptoms: Secondary | ICD-10-CM | POA: Diagnosis present

## 2023-10-06 DIAGNOSIS — Z1152 Encounter for screening for COVID-19: Secondary | ICD-10-CM | POA: Diagnosis not present

## 2023-10-06 DIAGNOSIS — N390 Urinary tract infection, site not specified: Secondary | ICD-10-CM

## 2023-10-06 DIAGNOSIS — I872 Venous insufficiency (chronic) (peripheral): Secondary | ICD-10-CM | POA: Diagnosis present

## 2023-10-06 DIAGNOSIS — G8929 Other chronic pain: Secondary | ICD-10-CM | POA: Diagnosis present

## 2023-10-06 DIAGNOSIS — Z96641 Presence of right artificial hip joint: Secondary | ICD-10-CM | POA: Diagnosis present

## 2023-10-06 DIAGNOSIS — N179 Acute kidney failure, unspecified: Secondary | ICD-10-CM

## 2023-10-06 DIAGNOSIS — G4733 Obstructive sleep apnea (adult) (pediatric): Secondary | ICD-10-CM | POA: Diagnosis present

## 2023-10-06 DIAGNOSIS — Z9884 Bariatric surgery status: Secondary | ICD-10-CM | POA: Diagnosis not present

## 2023-10-06 DIAGNOSIS — Z886 Allergy status to analgesic agent status: Secondary | ICD-10-CM | POA: Diagnosis not present

## 2023-10-06 DIAGNOSIS — Z9049 Acquired absence of other specified parts of digestive tract: Secondary | ICD-10-CM | POA: Diagnosis not present

## 2023-10-06 DIAGNOSIS — Z833 Family history of diabetes mellitus: Secondary | ICD-10-CM | POA: Diagnosis not present

## 2023-10-06 LAB — BASIC METABOLIC PANEL WITH GFR
Anion gap: 8 (ref 5–15)
BUN: 32 mg/dL — ABNORMAL HIGH (ref 8–23)
CO2: 25 mmol/L (ref 22–32)
Calcium: 7.9 mg/dL — ABNORMAL LOW (ref 8.9–10.3)
Chloride: 106 mmol/L (ref 98–111)
Creatinine, Ser: 1.33 mg/dL — ABNORMAL HIGH (ref 0.61–1.24)
GFR, Estimated: 55 mL/min — ABNORMAL LOW (ref >60)
Glucose, Bld: 114 mg/dL — ABNORMAL HIGH (ref 70–99)
Potassium: 4 mmol/L (ref 3.5–5.1)
Sodium: 139 mmol/L (ref 135–145)

## 2023-10-06 LAB — CORTISOL-AM, BLOOD: Cortisol - AM: 9.9 ug/dL (ref 6.7–22.6)

## 2023-10-06 LAB — LACTIC ACID, PLASMA: Lactic Acid, Venous: 1.3 mmol/L (ref 0.5–1.9)

## 2023-10-06 LAB — CBC
HCT: 28.7 % — ABNORMAL LOW (ref 39.0–52.0)
Hemoglobin: 8.9 g/dL — ABNORMAL LOW (ref 13.0–17.0)
MCH: 31 pg (ref 26.0–34.0)
MCHC: 31 g/dL (ref 30.0–36.0)
MCV: 100 fL (ref 80.0–100.0)
Platelets: 167 K/uL (ref 150–400)
RBC: 2.87 MIL/uL — ABNORMAL LOW (ref 4.22–5.81)
RDW: 12.9 % (ref 11.5–15.5)
WBC: 12.6 K/uL — ABNORMAL HIGH (ref 4.0–10.5)
nRBC: 0 % (ref 0.0–0.2)

## 2023-10-06 LAB — PROTIME-INR
INR: 1.1 (ref 0.8–1.2)
Prothrombin Time: 14.5 s (ref 11.4–15.2)

## 2023-10-06 LAB — URINE CULTURE

## 2023-10-06 MED ORDER — HYDRALAZINE HCL 20 MG/ML IJ SOLN: 10.0000 mg | Freq: Four times a day (QID) | INTRAMUSCULAR | Status: DC | PRN

## 2023-10-06 MED ORDER — ADULT MULTIVITAMIN W/MINERALS CH
1.0000 | ORAL_TABLET | Freq: Two times a day (BID) | ORAL | Status: DC
  Administered 2023-10-06 – 2023-10-07 (×3): 1 via ORAL
  Filled 2023-10-06 (×3): qty 1

## 2023-10-06 MED ORDER — SODIUM CHLORIDE 0.9 % IV SOLN: 1.0000 g | Freq: Once | INTRAVENOUS | Status: DC

## 2023-10-06 MED ORDER — MAGNESIUM HYDROXIDE 400 MG/5ML PO SUSP: 30.0000 mL | Freq: Every day | ORAL | Status: DC | PRN

## 2023-10-06 MED ORDER — ENOXAPARIN SODIUM 80 MG/0.8ML IJ SOSY
0.5000 mg/kg | PREFILLED_SYRINGE | INTRAMUSCULAR | Status: DC
  Filled 2023-10-06 (×2): qty 0.72

## 2023-10-06 MED ORDER — VITAMIN D 25 MCG (1000 UNIT) PO TABS
5000.0000 [IU] | ORAL_TABLET | Freq: Every day | ORAL | Status: DC
  Administered 2023-10-06 – 2023-10-07 (×2): 5000 [IU] via ORAL
  Filled 2023-10-06 (×2): qty 5

## 2023-10-06 MED ORDER — RENA-VITE PO TABS
2.0000 | ORAL_TABLET | Freq: Every day | ORAL | Status: DC
  Administered 2023-10-06 – 2023-10-07 (×2): 2 via ORAL
  Filled 2023-10-06 (×2): qty 2

## 2023-10-06 MED ORDER — TRAZODONE HCL 50 MG PO TABS: 25.0000 mg | ORAL_TABLET | Freq: Every evening | ORAL | Status: DC | PRN

## 2023-10-06 MED ORDER — SODIUM CHLORIDE 0.9 % IV SOLN
2.0000 g | INTRAVENOUS | Status: DC
  Administered 2023-10-06 – 2023-10-07 (×2): 2 g via INTRAVENOUS
  Filled 2023-10-06 (×2): qty 20

## 2023-10-06 MED ORDER — TRIAMTERENE-HCTZ 37.5-25 MG PO TABS
1.0000 | ORAL_TABLET | Freq: Every day | ORAL | Status: DC
  Filled 2023-10-06: qty 1

## 2023-10-06 MED ORDER — NYSTATIN 100000 UNIT/GM EX POWD
Freq: Two times a day (BID) | CUTANEOUS | Status: DC
  Filled 2023-10-06: qty 15

## 2023-10-06 MED ORDER — INFLUENZA VAC SPLIT HIGH-DOSE 0.5 ML IM SUSY
0.5000 mL | PREFILLED_SYRINGE | INTRAMUSCULAR | Status: DC
  Filled 2023-10-06: qty 0.5

## 2023-10-06 MED ORDER — LACTATED RINGERS IV SOLN: INTRAVENOUS | Status: DC

## 2023-10-06 MED ORDER — MEDIHONEY WOUND/BURN DRESSING EX PSTE
1.0000 | PASTE | Freq: Every day | CUTANEOUS | Status: DC
  Administered 2023-10-06 – 2023-10-07 (×2): 1 via TOPICAL
  Filled 2023-10-06: qty 44

## 2023-10-06 MED ORDER — ONDANSETRON HCL 4 MG/2ML IJ SOLN: 4.0000 mg | Freq: Four times a day (QID) | INTRAMUSCULAR | Status: DC | PRN

## 2023-10-06 MED ORDER — PANTOPRAZOLE SODIUM 40 MG PO TBEC
40.0000 mg | DELAYED_RELEASE_TABLET | Freq: Every day | ORAL | Status: DC
  Administered 2023-10-06 – 2023-10-07 (×2): 40 mg via ORAL
  Filled 2023-10-06 (×2): qty 1

## 2023-10-06 MED ORDER — FERROUS SULFATE 325 (65 FE) MG PO TABS
325.0000 mg | ORAL_TABLET | Freq: Two times a day (BID) | ORAL | Status: DC
  Administered 2023-10-06 – 2023-10-07 (×3): 325 mg via ORAL
  Filled 2023-10-06 (×3): qty 1

## 2023-10-06 MED ORDER — ACETAMINOPHEN 325 MG PO TABS: 650.0000 mg | ORAL_TABLET | Freq: Four times a day (QID) | ORAL | Status: DC | PRN

## 2023-10-06 MED ORDER — KETOCONAZOLE 2 % EX CREA
TOPICAL_CREAM | Freq: Two times a day (BID) | CUTANEOUS | Status: DC
  Filled 2023-10-06 (×2): qty 15

## 2023-10-06 MED ORDER — VITAMIN C 500 MG PO TABS
1000.0000 mg | ORAL_TABLET | Freq: Every day | ORAL | Status: DC
  Administered 2023-10-06 – 2023-10-07 (×2): 1000 mg via ORAL
  Filled 2023-10-06 (×2): qty 2

## 2023-10-06 MED ORDER — ACETAMINOPHEN 650 MG RE SUPP: 650.0000 mg | Freq: Four times a day (QID) | RECTAL | Status: DC | PRN

## 2023-10-06 MED ORDER — LACTATED RINGERS IV SOLN
150.0000 mL/h | INTRAVENOUS | Status: DC
  Administered 2023-10-06: 150 mL/h via INTRAVENOUS

## 2023-10-06 MED ORDER — ONDANSETRON HCL 4 MG PO TABS: 4.0000 mg | ORAL_TABLET | Freq: Four times a day (QID) | ORAL | Status: DC | PRN

## 2023-10-06 MED ORDER — TAMSULOSIN HCL 0.4 MG PO CAPS
0.4000 mg | ORAL_CAPSULE | Freq: Every day | ORAL | Status: DC
  Administered 2023-10-06 – 2023-10-07 (×2): 0.4 mg via ORAL
  Filled 2023-10-06 (×2): qty 1

## 2023-10-06 MED ORDER — MORPHINE SULFATE (PF) 2 MG/ML IV SOLN
2.0000 mg | INTRAVENOUS | Status: DC | PRN
  Administered 2023-10-06 (×2): 2 mg via INTRAVENOUS
  Filled 2023-10-06 (×2): qty 1

## 2023-10-06 NOTE — Plan of Care (Signed)
   Problem: Fluid Volume: Goal: Hemodynamic stability will improve Outcome: Progressing   Problem: Clinical Measurements: Goal: Diagnostic test results will improve Outcome: Progressing Goal: Signs and symptoms of infection will decrease Outcome: Progressing

## 2023-10-06 NOTE — Assessment & Plan Note (Signed)
-   The patient will be hydrated with IV lactated ringer  and will follow BMP. - Will avoid nephrotoxins.

## 2023-10-06 NOTE — Progress Notes (Signed)
 Brief hospitalist update note.  This is a nonbillable note.  Please see same-day H&P for full billable details.  Briefly, this is a very pleasant 78 year old gentleman that presented to the emergency department after acute onset of generalized weakness and severe fatigue after having his suprapubic catheter exchanged in the urology office.  He had a low-grade temp on arrival to the emergency room and otherwise normal laboratory investigation.  Creatinine mildly elevated.  Urinalysis was concerning for UTI.  Blood and urine culture sent.  I evaluated patient after initiation of IV fluids and IV antibiotics.  He reported feeling symptomatically improved.  Anticipate he will be able to discharge within 24 hours.  Calvin Robson MD  No charge

## 2023-10-06 NOTE — Assessment & Plan Note (Signed)
-   The patient will be admitted to a medical telemetry bed. - Will continue antibiotic therapy with IV Rocephin . - Will continue hydration with IV lactated ringer . - Will follow blood and urine cultures.

## 2023-10-06 NOTE — Consult Note (Addendum)
 WOC Nurse Consult Note: Reason for Consult: Requested to assess wound on Right ankle. Performed remotely evaluating photos and notes. Wound type: Cellulitis, stasis dermatitis. Pressure Injury POA: Yes/No/NA Measurement: aprox. 2.6 cm x 2 cm x 0.1 cm Wound bed: 100% yellow slough. Drainage (amount, consistency, odor) Scant amount, wound bed dry. Periwound: roller edges, scabs. Dressing procedure/placement/frequency: Cleanse with saline, pat dry the periwound skin. Apply Medihoney daily to the wound bed, cover with foam dressing. Change every 3 days or PRN. Moisturize the skin daily with barrier cream, wrap with Kerlix if necessary to hold the dressing.  WOC team will not plan to follow further. Please reconsult if further assistance is needed. Thank-you,  Lela Holm RN, CNS, [employer], MSN.  (Phone [phone])

## 2023-10-06 NOTE — Progress Notes (Signed)
 Patient arrived from the ED. Patient uses a CPAP at home at night. He did have some yeast in his groin folds. Order received from MD for nystatin  and cpap

## 2023-10-06 NOTE — Progress Notes (Signed)
 Anticoagulation monitoring(Lovenox ):  78 yo male ordered Lovenox  40 mg Q24h    Filed Weights   10/06/23 0006  Weight: (!) 144.2 kg (318 lb)   BMI 51.3    Lab Results  Component Value Date   CREATININE 1.32 (H) 10/05/2023   CREATININE 1.50 (H) 08/07/2021   CREATININE 1.15 05/06/2017   Estimated Creatinine Clearance: 64.6 mL/min (A) (by C-G formula based on SCr of 1.32 mg/dL (H)). Hemoglobin & Hematocrit     Component Value Date/Time   HGB 10.5 (L) 10/05/2023 1826   HCT 33.0 (L) 10/05/2023 1826     Per Protocol for Patient with estCrcl > 30 ml/min and BMI > 30, will transition to Lovenox  72.5 mg Q24h.

## 2023-10-06 NOTE — Assessment & Plan Note (Signed)
-   Will continue PPI therapy.

## 2023-10-06 NOTE — H&P (Addendum)
 [name]   PATIENT NAME: [name]    MR#:  [id number]  DATE OF BIRTH:  March 25, 1945  DATE OF ADMISSION:  10/05/2023  PRIMARY CARE PHYSICIAN: Geralene Levorn ORN, NP   Patient is coming from: Home  REQUESTING/REFERRING PHYSICIAN: Floy Roberts, MD  CHIEF COMPLAINT:   Chief Complaint  Patient presents with   Weakness    HISTORY OF PRESENT ILLNESS:  [name] is a 78 y.o. [name] with medical history significant for GERD, BPH, essential hypertension, who presented to the emergency room with acute onset of generalized weakness and fatigue.  The patient stated that he could not pick his feet to get up.  He has been having diarrhea without nausea or vomiting.  He has a suprapubic cath. No chest pain or palpitations.  No cough or wheezing or dyspnea.  He admitted to fever and chills.  No bleeding diathesis.  ED Course: Upon presentation to the emergency room temperature was 100.8/38.2 and BP 113/31 with otherwise normal vital signs.  Labs revealed a creatinine 1.32 and a BUN of 31 with albumin of 3.4.  Lactic acid was 1.1 and CBC showed leukocytosis of 12.2.  H&H were 10.5 and 33, PT and INR were normal.  Respiratory panel came back negative.  UA was positive for UTI.  Urine and blood cultures were sent. EKG as reviewed by me : EKG showed accelerated junctional rhythm with a rate of 76 with left axis deviation. Imaging: Portable chest x-ray showed cardiomegaly with vascular congestion and mild diffuse interstitial opacity probable mild edema with possible pericardial effusion.  The patient was given 1 g of IV Rocephin .  He will be admitted to a medical telemetry bed for further evaluation and management. PAST MEDICAL HISTORY:   Past Medical History:  Diagnosis Date   Acid reflux 01/05/2013   BPH (benign prostatic hypertrophy) with urinary retention    Cellulitis    foot   Chronic bacterial prostatitis    Essential (primary) hypertension 02/21/2014   GERD  (gastroesophageal reflux disease)    H/O: upper GI bleed    Hypertension    Morbid obesity with BMI of 50.0-59.9, adult (HCC)    Obstructive sleep apnea    uses C-Pap   Oxygen desaturation during sleep at night-1 1/2 L   Stasis dermatitis of both legs    Stasis dermatitis of both legs     PAST SURGICAL HISTORY:   Past Surgical History:  Procedure Laterality Date   CHOLECYSTECTOMY     COLONOSCOPY  2006   GASTRIC BYPASS     hip replacement Right 2005   HIP SURGERY     right   IR CATHETER TUBE CHANGE  05/08/2017   PELVIC EXAMINATION UNDER ANESTHESIA CYSTOURETHROSCOPY / SIGMOIDOSCOPY  2015    SOCIAL HISTORY:   Social History   Tobacco Use   Smoking status: Former    Current packs/day: 0.00    Types: Cigarettes, Pipe    Quit date: 06/19/1968    Years since quitting: 55.3    Passive exposure: Past   Smokeless tobacco: Never  Substance Use Topics   Alcohol use: No    FAMILY HISTORY:   Family History  Problem Relation Age of Onset   Diabetes Mother    Prostate cancer Neg Hx    Bladder Cancer Neg Hx     DRUG ALLERGIES:   Allergies  Allergen Reactions   Aspirin Other (See Comments)    Stomach bleed    REVIEW OF  SYSTEMS:   ROS As per history of present illness. All pertinent systems were reviewed above. Constitutional, HEENT, cardiovascular, respiratory, GI, GU, musculoskeletal, neuro, psychiatric, endocrine, integumentary and hematologic systems were reviewed and are otherwise negative/unremarkable except for positive findings mentioned above in the HPI.   MEDICATIONS AT HOME:   Prior to Admission medications   Medication Sig Start Date End Date Taking? Authorizing Provider  Ascorbic Acid  (VITAMIN C ) 1000 MG tablet Take 1,000 mg by mouth daily.    Yes [provider]  b complex vitamins tablet Take 2 tablets by mouth daily.   Yes [provider]  Cholecalciferol  125 MCG (5000 UT) TABS Take 1 tablet by mouth daily.   Yes [provider]  ferrous sulfate  325 (65 FE) MG tablet Take 325 mg by mouth 2 (two) times daily with a meal.   Yes [provider]  ketoconazole  (NIZORAL ) 2 % cream Apply topically to rash 1-2 times daily for 2-4 weeks. 04/29/23  Yes Vaillancourt, Samantha, PA-C  Multiple Vitamin (MULTI-VITAMINS) TABS Take 1 tablet by mouth 2 (two) times daily.   Yes [provider]  omeprazole (PRILOSEC) 20 MG capsule Take 20 mg by mouth daily.   Yes [provider]  pantoprazole  (PROTONIX ) 40 MG tablet Take 40 mg by mouth daily. Reported on 06/20/2015   Yes [provider]  tamsulosin  (FLOMAX ) 0.4 MG CAPS capsule Take 0.4 mg by mouth daily.    Yes [provider]  triamterene -hydrochlorothiazide  (MAXZIDE -25) 37.5-25 MG tablet Take 1 tablet by mouth daily.   Yes [provider]      VITAL SIGNS:  Blood pressure (!) 116/57, pulse (!) 33, temperature 98 F (36.7 C), temperature source Oral, height 5' 6 (1.676 m), weight (!) 144.2 kg, SpO2 97%.  PHYSICAL EXAMINATION:  Physical Exam  GENERAL:  78 y.o.-year-old [name] patient lying in the bed with no acute distress.  EYES: Pupils equal, round, reactive to light and accommodation. No scleral icterus. Extraocular muscles intact.  HEENT: Head atraumatic, normocephalic. Oropharynx and nasopharynx clear.  NECK:  Supple, no jugular venous distention. No thyroid  enlargement, no tenderness.  LUNGS: Normal breath sounds bilaterally, no wheezing, rales,rhonchi or crepitation. No use of accessory muscles of respiration.  CARDIOVASCULAR: Regular rate and rhythm, S1, S2 normal. No murmurs, rubs, or gallops.  ABDOMEN: Soft, nondistended, nontender. Bowel sounds present. No organomegaly or mass.  EXTREMITIES: No pedal edema, cyanosis, or clubbing.  NEUROLOGIC: Cranial nerves II through XII are intact. Muscle strength 5/5 in all extremities. Sensation intact. Gait not checked.  PSYCHIATRIC: The patient is alert and oriented x 3.   Normal affect and good eye contact. SKIN: No obvious rash, lesion, or ulcer.   LABORATORY PANEL:   CBC Recent Labs  Lab 10/06/23 0611  WBC 12.6*  HGB 8.9*  HCT 28.7*  PLT 167   ------------------------------------------------------------------------------------------------------------------  Chemistries  Recent Labs  Lab 10/05/23 1826  NA 136  K 4.1  CL 102  CO2 24  GLUCOSE 108*  BUN 31*  CREATININE 1.32*  CALCIUM 8.3*  AST 25  ALT 11  ALKPHOS 70  BILITOT 0.7   ------------------------------------------------------------------------------------------------------------------  Cardiac Enzymes No results for input(s): TROPONINI in the last 168 hours. ------------------------------------------------------------------------------------------------------------------  RADIOLOGY:  DG Chest Portable 1 View Result Date: 10/05/2023 CLINICAL DATA:  Weakness EXAM: PORTABLE CHEST 1 VIEW COMPARISON:  05/04/2013 FINDINGS: Cardiomegaly with vascular congestion and mild diffuse interstitial opacity, probable mild edema. Slightly globular cardiac configuration. No pleural effusion or pneumothorax. Right shoulder replacement IMPRESSION:  Cardiomegaly with vascular congestion and mild diffuse interstitial opacity, probable mild edema. Slightly globular cardiac configuration, cannot exclude pericardial effusion. Electronically Signed   By: Luke Bun M.D.   On: 10/05/2023 23:08      IMPRESSION AND PLAN:  Assessment and Plan: * Sepsis due to gram-negative UTI [facility]) - The patient will be admitted to a medical telemetry bed. - Will continue antibiotic therapy with IV Rocephin . - Will continue hydration with IV lactated ringer . - Will follow blood and urine cultures.  AKI (acute kidney injury) (HCC) - The patient will be hydrated with IV lactated ringer  and will follow BMP. - Will avoid nephrotoxins.  Essential hypertension - The patient will be placed on as needed IV  hydralazine . - Will hold off diuretic therapy given AKI.  GERD without esophagitis - Will continue PPI therapy.  BPH (benign prostatic hyperplasia) - Will continue Flomax .   DVT prophylaxis: Lovenox .  Advanced Care Planning:  Code Status: full code.  Family Communication:  The plan of care was discussed in details with the patient (and family). I answered all questions. The patient agreed to proceed with the above mentioned plan. Further management will depend upon hospital course. Disposition Plan: Back to previous home environment Consults called: none.  All the records are reviewed and case discussed with ED provider.  Status is: Inpatient  At the time of the admission, it appears that the appropriate admission status for this patient is inpatient.  This is judged to be reasonable and necessary in order to provide the required intensity of service to ensure the patient's safety given the presenting symptoms, physical exam findings and initial radiographic and laboratory data in the context of comorbid conditions.  The patient requires inpatient status due to high intensity of service, high risk of further deterioration and high frequency of surveillance required.  I certify that at the time of admission, it is my clinical judgment that the patient will require inpatient hospital care extending more than 2 midnights.                            Dispo: The patient is from: Home              Anticipated d/c is to: Home              Patient currently is not medically stable to d/c.              Difficult to place patient: No  Madison DELENA Peaches M.D on 10/06/2023 at 6:53 AM  [facility]   From 7 PM-7 AM, contact night-coverage [email or web address]  CC: Primary care physician; Geralene Levorn ORN, NP

## 2023-10-06 NOTE — Assessment & Plan Note (Signed)
Will continue Flomax.

## 2023-10-06 NOTE — ED Notes (Signed)
 CCMD called to initiate monitoring @ this time

## 2023-10-06 NOTE — Assessment & Plan Note (Addendum)
-   The patient will be placed on as needed IV hydralazine . - Will hold off diuretic therapy given AKI.

## 2023-10-07 DIAGNOSIS — A4150 Gram-negative sepsis, unspecified: Secondary | ICD-10-CM | POA: Diagnosis not present

## 2023-10-07 DIAGNOSIS — N390 Urinary tract infection, site not specified: Secondary | ICD-10-CM | POA: Diagnosis not present

## 2023-10-07 MED ORDER — MULTI-VITAMINS PO TABS: 1.0000 | ORAL_TABLET | Freq: Every day | ORAL | Status: AC

## 2023-10-07 MED ORDER — MEDIHONEY WOUND/BURN DRESSING EX PSTE: 1.0000 | PASTE | Freq: Every day | CUTANEOUS | Status: AC

## 2023-10-07 MED ORDER — CEPHALEXIN 500 MG PO CAPS: 500.0000 mg | ORAL_CAPSULE | Freq: Three times a day (TID) | ORAL | 0 refills | Status: AC

## 2023-10-07 MED ORDER — TRIAMTERENE-HCTZ 37.5-25 MG PO TABS: 1.0000 | ORAL_TABLET | Freq: Every day | ORAL | Status: AC

## 2023-10-07 MED ORDER — CHLORHEXIDINE GLUCONATE CLOTH 2 % EX PADS
6.0000 | MEDICATED_PAD | Freq: Every day | CUTANEOUS | Status: DC
  Administered 2023-10-07: 6 via TOPICAL

## 2023-10-07 NOTE — Evaluation (Signed)
 Physical Therapy Evaluation Patient Details Name: [name] MRN: [id number] DOB: 1945-09-10 Today's Date: 10/07/2023  History of Present Illness  [name] is a 78 y.o. [name] with medical history significant for GERD, BPH, essential hypertension, who presented to the emergency room with acute onset of generalized weakness and fatigue.  Clinical Impression  Pt admitted with above diagnosis. Pt currently with functional limitations due to the deficits listed below (see PT Problem List). Pt received upright in bed agreeable to PT with spouse present. Pt reports PTA he is mod-I ambulating with axillary crutches and still drives along with independence with ADL's.   To date, pt is reliant on bed features, momentum, and increased time for bed mobility. Assisted with slip on shoes. Set up assist for donning shorts to hold urostomy bag in pocket. Pt with bed elevated, able to stand at supervision with B crutches and complete ~80' of gait after fully donning shorts at CGA. Correct use of crutches with 4 point pattern. Reports at baseline mobility with maybe mild decreased gait speed due to acute weakness. Gait speed indicative of short community walking distances. Pt returns seated EoB  needs maxA for LE's to return to supine and maxA+2 with pt using BUE's on head rails to boost up in bed. Pt reports having trapeze bar set up at bedside at home to assist. Pt returned with all needs in reach. Recommend f/u recs to assist in addressing acute weakness and maximize return to PLOF.        If plan is discharge home, recommend the following: A little help with walking and/or transfers;A little help with bathing/dressing/bathroom;Assist for transportation;Help with stairs or ramp for entrance   Can travel by private vehicle        Equipment Recommendations None recommended by PT  Recommendations for Other Services       Functional Status Assessment Patient has had a recent decline in their  functional status and demonstrates the ability to make significant improvements in function in a reasonable and predictable amount of time.     Precautions / Restrictions Precautions Precautions: Fall Restrictions Weight Bearing Restrictions Per Provider Order: No      Mobility  Bed Mobility Overal bed mobility: Modified Independent             General bed mobility comments: increased time and effort, use of bed rails Patient Response: Cooperative  Transfers Overall transfer level: Needs assistance Equipment used: Crutches Transfers: Sit to/from Stand Sit to Stand: Supervision, From elevated surface           General transfer comment: elevated surface to mimic home set up. Mixed reporting from pt and spouse on bed height at home. Likely more elevation in hospital during today's session than home set up.    Ambulation/Gait Ambulation/Gait assistance: Contact guard assist Gait Distance (Feet): 80 Feet Assistive device: Crutches Gait Pattern/deviations: Step-through pattern Gait velocity: 10'/ 6 seconds = 1.66'/sec Gait velocity interpretation: 1.31 - 2.62 ft/sec, indicative of limited community ambulator   General Gait Details: correct sequencing with consistent 4 point gait pattern using crutches. reports near baseline gait just feels slowed cadence due to acute weakness.  Stairs            Wheelchair Mobility     Tilt Bed Tilt Bed Patient Response: Cooperative  Modified Rankin (Stroke Patients Only)       Balance Overall balance assessment: Needs assistance Sitting-balance support: Bilateral upper extremity supported, Feet supported Sitting balance-Leahy Scale: Good  Standing balance support: Bilateral upper extremity supported, During functional activity Standing balance-Leahy Scale: Poor Standing balance comment: reliant on BUE support at baseline                             Pertinent Vitals/Pain Pain Assessment Pain  Assessment: No/denies pain    Home Living Family/patient expects to be discharged to:: Private residence Living Arrangements: Spouse/significant other Available Help at Discharge: Family;Available 24 hours/day Type of Home: House Home Access: Ramped entrance       Home Layout: One level Home Equipment: Crutches;Lift chair;Other (comment) (trapeze bar)      Prior Function Prior Level of Function : Independent/Modified Independent;Driving                     Extremity/Trunk Assessment   Upper Extremity Assessment Upper Extremity Assessment: Overall WFL for tasks assessed    Lower Extremity Assessment Lower Extremity Assessment: Generalized weakness       Communication   Communication Communication: No apparent difficulties    Cognition Arousal: Alert Behavior During Therapy: WFL for tasks assessed/performed   PT - Cognitive impairments: No apparent impairments                         Following commands: Intact       Cueing Cueing Techniques: Verbal cues     General Comments General comments (skin integrity, edema, etc.): BLE's wrapped from chronic wounds    Exercises Other Exercises Other Exercises: Role of PT in acute setting, d/c recs.   Assessment/Plan    PT Assessment Patient needs continued PT services  PT Problem List Decreased strength;Decreased range of motion;Decreased activity tolerance;Decreased balance;Decreased mobility       PT Treatment Interventions DME instruction;Gait training;Patient/family education;Stair training;Functional mobility training;Therapeutic activities;Therapeutic exercise;Balance training;Neuromuscular re-education    PT Goals (Current goals can be found in the Care Plan section)  Acute Rehab PT Goals Patient Stated Goal: to go home PT Goal Formulation: With patient Time For Goal Achievement: 10/21/23 Potential to Achieve Goals: Good    Frequency Min 2X/week     Co-evaluation                AM-PAC PT 6 Clicks Mobility  Outcome Measure Help needed turning from your back to your side while in a flat bed without using bedrails?: A Lot Help needed moving from lying on your back to sitting on the side of a flat bed without using bedrails?: A Lot Help needed moving to and from a bed to a chair (including a wheelchair)?: A Little Help needed standing up from a chair using your arms (e.g., wheelchair or bedside chair)?: A Little Help needed to walk in hospital room?: A Little Help needed climbing 3-5 steps with a railing? : A Lot 6 Click Score: 15    End of Session Equipment Utilized During Treatment: Gait belt Activity Tolerance: Patient tolerated treatment well Patient left: in bed;with call bell/phone within reach;with bed alarm set;with family/visitor present Nurse Communication: Mobility status PT Visit Diagnosis: Difficulty in walking, not elsewhere classified (R26.2);Muscle weakness (generalized) (M62.81)    Time: 1115-1140 PT Time Calculation (min) (ACUTE ONLY): 25 min   Charges:   PT Evaluation $PT Eval Low Complexity: 1 Low PT Treatments $Therapeutic Activity: 8-22 mins PT General Charges $$ ACUTE PT VISIT: 1 Visit         Dorina HERO. Fairly IV, PT, DPT Physical Therapist-  [facility]  [facility] 10/07/2023, 12:24 PM

## 2023-10-07 NOTE — Discharge Summary (Signed)
 Physician Discharge Summary   Patient: [name] MRN: [id number] DOB: [birth year]  Admit date:     10/05/2023  Discharge date: 10/07/23  Discharge Physician: AIDA CHO   PCP: Geralene Levorn ORN, NP   Recommendations at discharge:   Follow-up with PCP in 1 week Repeat BMP to monitor kidney function within 1 week of discharge  Discharge Diagnoses: Principal Problem:   Sepsis due to gram-negative UTI (HCC) Active Problems:   AKI (acute kidney injury) (HCC)   Essential hypertension   GERD without esophagitis   BPH (benign prostatic hyperplasia)  Resolved Problems:   * No resolved hospital problems. *  Hospital Course:  [name] is a 78 year old man with medical history significant for GERD, BPH with chronic urinary retention and suprapubic catheter in place, hypertension, who presented to the ED because of acute onset of general weakness, fatigue and inability to walk.  He normally uses crutches for ambulation at baseline.  He also complained of diarrhea.  He did not have any nausea or vomiting.  He recently had his suprapubic catheter changed on 10/05/2023, earlier in the day, prior to admission.   Vital signs in the ED: Temperature 100.8 F, pulse 76, BP 113/31, O2 sat 94% on room air.  He was admitted to the hospital for acute complicated UTI and AKI.  Assessment and Plan:   Sepsis secondary to acute complicated UTI: S/p treatment with IV Rocephin  x 3 doses.  He will be discharged on Keflex  for 4 more days to complete 7 days of treatment. Urine culture showed multiple species. No growth on blood cultures at the time of discharge.   AKI: Improved.  Creatinine down from 1.50-1.33.  Repeat BMP within 1 week of discharge. 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   BPH with chronic urinary retention: Suprapubic catheter was exchanged on 10/05/2023, prior to admission.  Follow-up with urologist.   General weakness: PT recommended home health PT.  He uses crutches  for ambulation at baseline.   Comorbidities include hypertension, GERD,, chronic back pain and knee pain   His condition has improved and he is deemed stable for discharge to home today.  Discharge plan was discussed with patient and his wife at the bedside.      Consultants: None Procedures performed: None Disposition: Home health Diet recommendation:  Discharge Diet Orders (From admission, onward)     Start     Ordered   10/07/23 0000  Diet - low sodium heart healthy        10/07/23 1157           Cardiac diet DISCHARGE MEDICATION: Allergies as of 10/07/2023       Reactions   Aspirin Other (See Comments)   Stomach bleed        Medication List     STOP taking these medications    b complex vitamins tablet   omeprazole 20 MG capsule Commonly known as: PRILOSEC       TAKE these medications    cephALEXin  500 MG capsule Commonly known as: KEFLEX  Take 1 capsule (500 mg total) by mouth 3 (three) times daily for 4 days. Start taking on: October 08, 2023   Cholecalciferol  125 MCG (5000 UT) Tabs Take 1 tablet by mouth daily.   ferrous sulfate  325 (65 FE) MG tablet Take 325 mg by mouth 2 (two) times daily with a meal.   ketoconazole  2 % cream Commonly known as: NIZORAL  Apply topically to rash 1-2 times daily for 2-4  weeks.   leptospermum manuka honey Pste paste Apply 1 Application topically daily. Start taking on: October 08, 2023   Multi-Vitamins Tabs Take 1 tablet by mouth daily. What changed: when to take this   pantoprazole  40 MG tablet Commonly known as: PROTONIX  Take 40 mg by mouth daily. Reported on 06/20/2015   tamsulosin  0.4 MG Caps capsule Commonly known as: FLOMAX  Take 0.4 mg by mouth daily.   triamterene -hydrochlorothiazide  37.5-25 MG tablet Commonly known as: MAXZIDE -25 Take 1 tablet by mouth daily. Start taking on: October 08, 2023   vitamin C  1000 MG tablet Take 1,000 mg by mouth daily.                Discharge Care Instructions  (From admission, onward)           Start     Ordered   10/07/23 0000  Discharge wound care:       Comments: Right ankle: Cleanse with saline, pat dry the periwound skin. Apply Medihoney daily to the wound bed, cover with foam dressing. Change every 3 days or PRN. Moisturize the skin daily with barrier cream, wrap with Kerlix if necessary to hold the dressing.   10/07/23 1157            Discharge Exam: Filed Weights   10/06/23 0006  Weight: (!) 144.2 kg   GEN: NAD SKIN: Warm and dry EYES: No pallor or icterus ENT: MMM CV: RRR PULM: CTA B ABD: soft, obese with large pannus, NT, +BS CNS: AAO x 3, non focal EXT: No edema or tenderness   Condition at discharge: good  The results of significant diagnostics from this hospitalization (including imaging, microbiology, ancillary and laboratory) are listed below for reference.   Imaging Studies: DG Chest Portable 1 View Result Date: 10/05/2023 CLINICAL DATA:  Weakness EXAM: PORTABLE CHEST 1 VIEW COMPARISON:  05/04/2013 FINDINGS: Cardiomegaly with vascular congestion and mild diffuse interstitial opacity, probable mild edema. Slightly globular cardiac configuration. No pleural effusion or pneumothorax. Right shoulder replacement IMPRESSION: Cardiomegaly with vascular congestion and mild diffuse interstitial opacity, probable mild edema. Slightly globular cardiac configuration, cannot exclude pericardial effusion. Electronically Signed   By: Luke Bun M.D.   On: 10/05/2023 23:08    Microbiology: Results for orders placed or performed during the hospital encounter of 10/05/23  Urine Culture     Status: Abnormal   Collection Time: 10/05/23  8:13 PM   Specimen: Urine, Random  Result Value Ref Range Status   Specimen Description   Final    URINE, RANDOM Performed at [facility], [address], [place], [place] [address]    Special Requests   Final    NONE Performed at [facility], [address] [place], [place] [address]    Culture MULTIPLE SPECIES PRESENT, SUGGEST RECOLLECTION (A)  Final   Report Status 10/06/2023 FINAL  Final  Blood culture (routine x 2)     Status: None (Preliminary result)   Collection Time: 10/05/23  9:40 PM   Specimen: BLOOD  Result Value Ref Range Status   Specimen Description BLOOD LEFT ASSIST CONTROL  Final   Special Requests   Final    BOTTLES DRAWN AEROBIC AND ANAEROBIC Blood Culture adequate volume   Culture   Final    NO GROWTH 2 DAYS Performed at [facility], [address] [place], [place] [address]    Report Status PENDING  Incomplete  Blood culture (routine x 2)     Status: None (Preliminary result)  Collection Time: 10/05/23  9:40 PM   Specimen: BLOOD  Result Value Ref Range Status   Specimen Description BLOOD RIGHT ASSIST CONTROL  Final   Special Requests   Final    BOTTLES DRAWN AEROBIC AND ANAEROBIC Blood Culture adequate volume   Culture   Final    NO GROWTH 2 DAYS Performed at [facility], [address], [place], [place] [address]    Report Status PENDING  Incomplete  Resp panel by RT-PCR (RSV, Flu A&B, Covid) Anterior Nasal Swab     Status: None   Collection Time: 10/05/23  9:40 PM   Specimen: Anterior Nasal Swab  Result Value Ref Range Status   SARS Coronavirus 2 by RT PCR NEGATIVE NEGATIVE Final    Comment: (NOTE) SARS-CoV-2 target nucleic acids are NOT DETECTED.  The SARS-CoV-2 RNA is generally detectable in upper respiratory specimens during the acute phase of infection. The lowest concentration of SARS-CoV-2 viral copies this assay can detect is 138 copies/mL. A negative result does not preclude SARS-Cov-2 infection and should not be used as the sole basis for treatment or other patient management decisions. A negative result may occur with  improper specimen collection/handling, submission of specimen other than nasopharyngeal swab, presence of viral mutation(s)  within the areas targeted by this assay, and inadequate number of viral copies(<138 copies/mL). A negative result must be combined with clinical observations, patient history, and epidemiological information. The expected result is Negative.  Fact Sheet for Patients:  BloggerCourse.com  Fact Sheet for Healthcare Providers:  SeriousBroker.it  This test is no t yet approved or cleared by the [place]  FDA and  has been authorized for detection and/or diagnosis of SARS-CoV-2 by FDA under an Emergency Use Authorization (EUA). This EUA will remain  in effect (meaning this test can be used) for the duration of the COVID-19 declaration under Section 564(b)(1) of the Act, 21 U.S.C.section 360bbb-3(b)(1), unless the authorization is terminated  or revoked sooner.       Influenza A by PCR NEGATIVE NEGATIVE Final   Influenza B by PCR NEGATIVE NEGATIVE Final    Comment: (NOTE) The Xpert Xpress SARS-CoV-2/FLU/RSV plus assay is intended as an aid in the diagnosis of influenza from Nasopharyngeal swab specimens and should not be used as a sole basis for treatment. Nasal washings and aspirates are unacceptable for Xpert Xpress SARS-CoV-2/FLU/RSV testing.  Fact Sheet for Patients: BloggerCourse.com  Fact Sheet for Healthcare Providers: SeriousBroker.it  This test is not yet approved or cleared by the [place]  FDA and has been authorized for detection and/or diagnosis of SARS-CoV-2 by FDA under an Emergency Use Authorization (EUA). This EUA will remain in effect (meaning this test can be used) for the duration of the COVID-19 declaration under Section 564(b)(1) of the Act, 21 U.S.C. section 360bbb-3(b)(1), unless the authorization is terminated or revoked.     Resp Syncytial Virus by PCR NEGATIVE NEGATIVE Final    Comment: (NOTE) Fact Sheet for  Patients: BloggerCourse.com  Fact Sheet for Healthcare Providers: SeriousBroker.it  This test is not yet approved or cleared by the [place]  FDA and has been authorized for detection and/or diagnosis of SARS-CoV-2 by FDA under an Emergency Use Authorization (EUA). This EUA will remain in effect (meaning this test can be used) for the duration of the COVID-19 declaration under Section 564(b)(1) of the Act, 21 U.S.C. section 360bbb-3(b)(1), unless the authorization is terminated or revoked.  Performed at [facility], [address] [place], [place] [address]  Labs: CBC: Recent Labs  Lab 10/05/23 1826 10/06/23 0611  WBC 12.2* 12.6*  HGB 10.5* 8.9*  HCT 33.0* 28.7*  MCV 98.2 100.0  PLT 197 167   Basic Metabolic Panel: Recent Labs  Lab 10/05/23 1826 10/06/23 0611  NA 136 139  K 4.1 4.0  CL 102 106  CO2 24 25  GLUCOSE 108* 114*  BUN 31* 32*  CREATININE 1.32* 1.33*  CALCIUM 8.3* 7.9*   Liver Function Tests: Recent Labs  Lab 10/05/23 1826  AST 25  ALT 11  ALKPHOS 70  BILITOT 0.7  PROT 7.7  ALBUMIN 3.4*   CBG: No results for input(s): GLUCAP in the last 168 hours.  Discharge time spent: greater than 30 minutes.  Signed: AIDA CHO, MD [facility] 10/07/2023

## 2023-10-07 NOTE — TOC Initial Note (Signed)
 Transition of Care [facility]) - Initial/Assessment Note    Patient Details  Name: [name] MRN: [id number] Date of Birth: [birth year]  Transition of Care [facility]) CM/SW Contact:    Corean ONEIDA Haddock, RN Phone Number: 10/07/2023, 1:49 PM  Clinical Narrative:                  Met with patient, wife and son at bedside Patient to discharge today    Therapy recommending HH. Patient in agreement.  States that he does not have a preference of agency.  Discussed the option of adding RN for wound care.  Patient declines and states that he will be following up with the wound care center tomorrow. Referral made and accepted with Darleene with [facility]    Patient Goals and CMS Choice            Expected Discharge Plan and Services         Expected Discharge Date: 10/07/23                                    Prior Living Arrangements/Services                       Activities of Daily Living   ADL Screening (condition at time of admission) Independently performs ADLs?: Yes (appropriate for developmental age) Is the patient deaf or have difficulty hearing?: No Does the patient have difficulty seeing, even when wearing glasses/contacts?: No Does the patient have difficulty concentrating, remembering, or making decisions?: No  Permission Sought/Granted                  Emotional Assessment              Admission diagnosis:  Lower urinary tract infectious disease [N39.0] Sepsis due to gram-negative UTI (HCC) [A41.50, N39.0] Patient Active Problem List   Diagnosis Date Noted   Sepsis due to gram-negative UTI (HCC) 10/06/2023   GERD without esophagitis 10/06/2023   BPH (benign prostatic hyperplasia) 10/06/2023   Essential hypertension 10/06/2023   AKI (acute kidney injury) (HCC) 10/06/2023   Panniculitis 01/12/2018   Primary localized osteoarthritis of pelvic region and thigh 12/04/2017   Rotator cuff tear arthropathy of right shoulder 05/09/2015   Urinary  retention 01/16/2015   Pain in shoulder 11/28/2014   Arthralgia of hip 11/28/2014   Benign prostatic hyperplasia with urinary retention    Chronic bacterial prostatitis    Morbid obesity with BMI of 50.0-59.9, adult (HCC)    Stasis dermatitis of both legs    H/O: upper GI bleed    Essential (primary) hypertension 02/21/2014   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 [place] [address] Phone: [phone] Fax: [phone]     Social Drivers of Health (SDOH) Social History: SDOH Screenings   Food Insecurity: No Food Insecurity (10/06/2023)  Housing: Low Risk  (10/06/2023)  Transportation Needs: No Transportation Needs (10/06/2023)  Utilities: Not At Risk (  10/06/2023)  Financial Resource Strain: Low Risk  (08/25/2022)   Received from [facility]  Physical Activity: Sufficiently Active (08/25/2022)   Received from [facility]  Social Connections: Moderately Isolated (10/06/2023)  Stress: No Stress Concern Present (08/25/2022)   Received from [facility]  Tobacco Use: Medium Risk (10/05/2023)  Health Literacy: Low Risk  (08/25/2022)   Received from [facility]   SDOH Interventions:     Readmission Risk Interventions     No data to display

## 2023-10-07 NOTE — Plan of Care (Signed)

## 2023-10-08 ENCOUNTER — Ambulatory Visit: Admitting: Physician Assistant

## 2023-10-08 DIAGNOSIS — I890 Lymphedema, not elsewhere classified: Secondary | ICD-10-CM | POA: Diagnosis not present

## 2023-10-08 DIAGNOSIS — I87331 Chronic venous hypertension (idiopathic) with ulcer and inflammation of right lower extremity: Secondary | ICD-10-CM | POA: Diagnosis not present

## 2023-10-08 DIAGNOSIS — L97318 Non-pressure chronic ulcer of right ankle with other specified severity: Secondary | ICD-10-CM | POA: Diagnosis present

## 2023-10-10 LAB — CULTURE, BLOOD (ROUTINE X 2)
Culture: NO GROWTH
Culture: NO GROWTH
Special Requests: ADEQUATE
Special Requests: ADEQUATE

## 2023-10-16 ENCOUNTER — Encounter: Admitting: Physician Assistant

## 2023-10-16 DIAGNOSIS — L97318 Non-pressure chronic ulcer of right ankle with other specified severity: Secondary | ICD-10-CM | POA: Diagnosis not present

## 2023-10-23 ENCOUNTER — Encounter: Admitting: Physician Assistant

## 2023-10-23 DIAGNOSIS — L97318 Non-pressure chronic ulcer of right ankle with other specified severity: Secondary | ICD-10-CM | POA: Diagnosis not present

## 2023-10-27 ENCOUNTER — Ambulatory Visit: Payer: Medicare Other | Admitting: Physician Assistant

## 2023-10-29 ENCOUNTER — Encounter: Attending: Physician Assistant | Admitting: Physician Assistant

## 2023-10-29 DIAGNOSIS — I890 Lymphedema, not elsewhere classified: Secondary | ICD-10-CM | POA: Insufficient documentation

## 2023-10-29 DIAGNOSIS — Z87891 Personal history of nicotine dependence: Secondary | ICD-10-CM | POA: Insufficient documentation

## 2023-10-29 DIAGNOSIS — L97318 Non-pressure chronic ulcer of right ankle with other specified severity: Secondary | ICD-10-CM | POA: Insufficient documentation

## 2023-10-29 DIAGNOSIS — I87331 Chronic venous hypertension (idiopathic) with ulcer and inflammation of right lower extremity: Secondary | ICD-10-CM | POA: Diagnosis not present

## 2023-10-29 DIAGNOSIS — Z960 Presence of urogenital implants: Secondary | ICD-10-CM | POA: Insufficient documentation

## 2023-10-29 DIAGNOSIS — G4730 Sleep apnea, unspecified: Secondary | ICD-10-CM | POA: Diagnosis not present

## 2023-10-29 DIAGNOSIS — N400 Enlarged prostate without lower urinary tract symptoms: Secondary | ICD-10-CM | POA: Insufficient documentation

## 2023-11-04 ENCOUNTER — Encounter: Admitting: Physician Assistant

## 2023-11-04 DIAGNOSIS — I87331 Chronic venous hypertension (idiopathic) with ulcer and inflammation of right lower extremity: Secondary | ICD-10-CM | POA: Diagnosis not present

## 2023-11-05 ENCOUNTER — Ambulatory Visit: Admitting: Physician Assistant

## 2023-11-05 ENCOUNTER — Ambulatory Visit (INDEPENDENT_AMBULATORY_CARE_PROVIDER_SITE_OTHER): Admitting: Physician Assistant

## 2023-11-05 DIAGNOSIS — Z435 Encounter for attention to cystostomy: Secondary | ICD-10-CM

## 2023-11-05 NOTE — Progress Notes (Signed)
 Suprapubic Cath Change  Patient is present today for a suprapubic catheter change due to urinary retention.  9ml of water was drained from the balloon, a 18FR Silastic foley cath was removed from the tract without difficulty.  Site was cleaned and prepped in a sterile fashion with betadine.  A 18FR Silastic foley cath was replaced into the tract no complications were noted. Urine return was not noted, 10 ml of sterile water was inflated into the balloon and a leg bag on extension tubing was attached for drainage.  Patient tolerated well.  Performed by: Pearl Berlinger, PA-C and Laymon Ned, CMA  Follow up: Return in about 4 weeks (around 12/03/2023) for SPT exchange.

## 2023-11-12 ENCOUNTER — Encounter: Admitting: Physician Assistant

## 2023-11-12 DIAGNOSIS — I87331 Chronic venous hypertension (idiopathic) with ulcer and inflammation of right lower extremity: Secondary | ICD-10-CM | POA: Diagnosis not present

## 2023-11-19 ENCOUNTER — Encounter: Admitting: Physician Assistant

## 2023-11-19 DIAGNOSIS — I87331 Chronic venous hypertension (idiopathic) with ulcer and inflammation of right lower extremity: Secondary | ICD-10-CM | POA: Diagnosis not present

## 2023-11-26 ENCOUNTER — Encounter: Admitting: Physician Assistant

## 2023-11-26 DIAGNOSIS — I87331 Chronic venous hypertension (idiopathic) with ulcer and inflammation of right lower extremity: Secondary | ICD-10-CM | POA: Diagnosis not present

## 2023-12-02 ENCOUNTER — Encounter: Attending: Physician Assistant | Admitting: Physician Assistant

## 2023-12-02 DIAGNOSIS — L97318 Non-pressure chronic ulcer of right ankle with other specified severity: Secondary | ICD-10-CM | POA: Diagnosis present

## 2023-12-02 DIAGNOSIS — I87331 Chronic venous hypertension (idiopathic) with ulcer and inflammation of right lower extremity: Secondary | ICD-10-CM | POA: Diagnosis not present

## 2023-12-02 DIAGNOSIS — I890 Lymphedema, not elsewhere classified: Secondary | ICD-10-CM | POA: Diagnosis not present

## 2023-12-08 ENCOUNTER — Ambulatory Visit (INDEPENDENT_AMBULATORY_CARE_PROVIDER_SITE_OTHER): Admitting: Physician Assistant

## 2023-12-08 VITALS — BP 177/68 | HR 55

## 2023-12-08 DIAGNOSIS — Z435 Encounter for attention to cystostomy: Secondary | ICD-10-CM | POA: Diagnosis not present

## 2023-12-08 NOTE — Progress Notes (Signed)
 Suprapubic Cath Change  Patient is present today for a suprapubic catheter change due to urinary retention.  8ml of water was drained from the balloon, a 18FR Silastic foley cath was removed from the tract with out difficulty.  Site was cleaned and prepped in a sterile fashion with betadine.  A 18FR foley cath was replaced into the tract no complications were noted. Urine return was noted, 10 ml of sterile water was inflated into the balloon and a leg bag on extension tubing was attached for drainage.  Patient tolerated well.   Performed by: Clarabel Marion, PA-C  Additional notes: Poor output from SPT this week; he is leaking around the tube and per urethra. Will switch from silastic to standard Foley to see if this drains better.  Follow up: Return in about 4 weeks (around 01/05/2024) for SPT exchange.

## 2023-12-10 ENCOUNTER — Encounter: Admitting: Physician Assistant

## 2023-12-10 DIAGNOSIS — L97318 Non-pressure chronic ulcer of right ankle with other specified severity: Secondary | ICD-10-CM | POA: Diagnosis not present

## 2023-12-16 ENCOUNTER — Encounter: Admitting: Physician Assistant

## 2023-12-16 DIAGNOSIS — L97318 Non-pressure chronic ulcer of right ankle with other specified severity: Secondary | ICD-10-CM | POA: Diagnosis not present

## 2023-12-23 ENCOUNTER — Encounter: Admitting: Physician Assistant

## 2023-12-23 DIAGNOSIS — L97318 Non-pressure chronic ulcer of right ankle with other specified severity: Secondary | ICD-10-CM | POA: Diagnosis not present

## 2023-12-30 ENCOUNTER — Encounter: Attending: Physician Assistant | Admitting: Physician Assistant

## 2023-12-30 DIAGNOSIS — L97312 Non-pressure chronic ulcer of right ankle with fat layer exposed: Secondary | ICD-10-CM | POA: Insufficient documentation

## 2023-12-30 DIAGNOSIS — I890 Lymphedema, not elsewhere classified: Secondary | ICD-10-CM | POA: Insufficient documentation

## 2023-12-30 DIAGNOSIS — I87331 Chronic venous hypertension (idiopathic) with ulcer and inflammation of right lower extremity: Secondary | ICD-10-CM | POA: Insufficient documentation

## 2024-01-04 NOTE — Telephone Encounter (Signed)
 Patient is requesting the following refill Requested Prescriptions   Pending Prescriptions Disp Refills   amoxicillin (AMOXIL) 500 MG capsule [Pharmacy Med Name: AMOXICILLIN 500 MG CAPSULE] 30 capsule 2    Sig: TAKE 1 CAPSULE (500 MG TOTAL) BY MOUTH THREE (3) TIMES A DAY. USE AS NEEDED INFECTION OF URINE.    Recent Visits Date Type Provider Dept  12/01/23 Office Visit Geralene Levorn [place], FNP [facility]  10/15/23 Office Visit Alyse Slater Pao, MD [facility] [address] [facility] [address] [facility]  08/28/23 Office Visit Geralene Levorn Geralds, FNP [facility] [address] [facility]  06/01/23 Office Visit Geralene Levorn Geralds, FNP [facility] [address] [facility]  02/27/23 Office Visit Geralene Levorn Geralds, FNP [facility] [address] [facility]  Showing recent visits within past 365 days and meeting all other requirements Future Appointments Date Type Provider Dept  05/30/24 Appointment Geralene Levorn Geralds, FNP [facility] [address] [facility]  Showing future appointments within next 365 days and meeting all other requirements    Labs: Not applicable this refill

## 2024-01-06 ENCOUNTER — Ambulatory Visit (INDEPENDENT_AMBULATORY_CARE_PROVIDER_SITE_OTHER): Admitting: Physician Assistant

## 2024-01-06 DIAGNOSIS — Z435 Encounter for attention to cystostomy: Secondary | ICD-10-CM

## 2024-01-06 DIAGNOSIS — N3289 Other specified disorders of bladder: Secondary | ICD-10-CM | POA: Diagnosis not present

## 2024-01-06 MED ORDER — TROSPIUM CHLORIDE 20 MG PO TABS: 20.0000 mg | ORAL_TABLET | Freq: Two times a day (BID) | ORAL | 11 refills | Status: AC

## 2024-01-06 NOTE — Progress Notes (Signed)
 Suprapubic Cath Change  Patient is present today for a suprapubic catheter change due to urinary retention.  8ml of water was drained from the balloon, a 18FR foley cath was removed from the tract with out difficulty.  Site was cleaned and prepped in a sterile fashion with betadine.  A 18FR foley cath was replaced into the tract no complications were noted. Urine return was not noted, 10 ml of sterile water was inflated into the balloon and a leg bag on extension tubing was attached for drainage.  Patient tolerated well.   Performed by: Derald Lorge, PA-C and Gabrielle Aiden, PA-C  Additional Notes: He reports intermittent burning associated with urine leakage per urethra.  No constipation, dry mouth, or dry eye at baseline.  Will start trospium ER 20 mg twice daily for bladder spasms. Also stopping Flomax , not indicated with SPT in place.  Follow up: Return in about 4 weeks (around 02/03/2024) for SPT exchange.

## 2024-01-07 ENCOUNTER — Encounter: Admitting: Internal Medicine

## 2024-01-07 DIAGNOSIS — I87331 Chronic venous hypertension (idiopathic) with ulcer and inflammation of right lower extremity: Secondary | ICD-10-CM | POA: Diagnosis present

## 2024-01-07 DIAGNOSIS — L97312 Non-pressure chronic ulcer of right ankle with fat layer exposed: Secondary | ICD-10-CM | POA: Diagnosis not present

## 2024-01-07 DIAGNOSIS — I890 Lymphedema, not elsewhere classified: Secondary | ICD-10-CM | POA: Diagnosis not present

## 2024-01-14 ENCOUNTER — Encounter: Admitting: Internal Medicine

## 2024-01-14 DIAGNOSIS — I87331 Chronic venous hypertension (idiopathic) with ulcer and inflammation of right lower extremity: Secondary | ICD-10-CM | POA: Diagnosis not present

## 2024-02-02 ENCOUNTER — Encounter: Attending: Physician Assistant | Admitting: Physician Assistant

## 2024-02-02 DIAGNOSIS — I87331 Chronic venous hypertension (idiopathic) with ulcer and inflammation of right lower extremity: Secondary | ICD-10-CM | POA: Insufficient documentation

## 2024-02-02 DIAGNOSIS — I890 Lymphedema, not elsewhere classified: Secondary | ICD-10-CM | POA: Insufficient documentation

## 2024-02-02 DIAGNOSIS — L97318 Non-pressure chronic ulcer of right ankle with other specified severity: Secondary | ICD-10-CM | POA: Insufficient documentation

## 2024-02-08 ENCOUNTER — Ambulatory Visit (INDEPENDENT_AMBULATORY_CARE_PROVIDER_SITE_OTHER): Admitting: Physician Assistant

## 2024-02-08 DIAGNOSIS — Z435 Encounter for attention to cystostomy: Secondary | ICD-10-CM | POA: Diagnosis not present

## 2024-02-08 NOTE — Progress Notes (Signed)
 Suprapubic Cath Change  Patient is present today for a suprapubic catheter change due to urinary retention.  8ml of water was drained from the balloon, a 18FR foley cath was removed from the tract with out difficulty.  Site was cleaned and prepped in a sterile fashion with betadine.  A 18FR coude foley cath was replaced into the tract no complications were noted. Urine return was noted, 10 ml of sterile water was inflated into the balloon and a leg bag on extension tubing was attached for drainage.  Patient tolerated well.     Performed by: Adah Stoneberg, PA-C and Mathew Pinal, RN  Follow up: Return in about 4 weeks (around 03/07/2024) for SPT exchange.

## 2024-03-07 ENCOUNTER — Ambulatory Visit: Admitting: Physician Assistant
